# Patient Record
Sex: Female | Born: 1965 | Race: White | Hispanic: No | Marital: Single | Smoking: Former smoker
Health system: Southern US, Community
[De-identification: ages and names within clinical notes are randomized; demographics above are authoritative.]

## PROBLEM LIST (undated history)

## (undated) DIAGNOSIS — T8859XA Other complications of anesthesia, initial encounter: Secondary | ICD-10-CM

## (undated) DIAGNOSIS — IMO0002 Reserved for concepts with insufficient information to code with codable children: Secondary | ICD-10-CM

## (undated) DIAGNOSIS — K649 Unspecified hemorrhoids: Secondary | ICD-10-CM

## (undated) DIAGNOSIS — R519 Headache, unspecified: Secondary | ICD-10-CM

## (undated) DIAGNOSIS — N200 Calculus of kidney: Secondary | ICD-10-CM

## (undated) DIAGNOSIS — Z87442 Personal history of urinary calculi: Secondary | ICD-10-CM

## (undated) DIAGNOSIS — I10 Essential (primary) hypertension: Secondary | ICD-10-CM

## (undated) DIAGNOSIS — E785 Hyperlipidemia, unspecified: Secondary | ICD-10-CM

## (undated) DIAGNOSIS — M549 Dorsalgia, unspecified: Secondary | ICD-10-CM

## (undated) DIAGNOSIS — Z9889 Other specified postprocedural states: Secondary | ICD-10-CM

## (undated) DIAGNOSIS — F419 Anxiety disorder, unspecified: Secondary | ICD-10-CM

## (undated) DIAGNOSIS — R0602 Shortness of breath: Secondary | ICD-10-CM

## (undated) DIAGNOSIS — N289 Disorder of kidney and ureter, unspecified: Secondary | ICD-10-CM

## (undated) DIAGNOSIS — R112 Nausea with vomiting, unspecified: Secondary | ICD-10-CM

## (undated) DIAGNOSIS — G8929 Other chronic pain: Secondary | ICD-10-CM

## (undated) DIAGNOSIS — F32A Depression, unspecified: Secondary | ICD-10-CM

## (undated) DIAGNOSIS — T4145XA Adverse effect of unspecified anesthetic, initial encounter: Secondary | ICD-10-CM

## (undated) HISTORY — PX: CHOLECYSTECTOMY: SHX55

## (undated) HISTORY — DX: Unspecified hemorrhoids: K64.9

## (undated) HISTORY — DX: Other chronic pain: G89.29

## (undated) HISTORY — PX: LITHOTRIPSY: SUR834

## (undated) HISTORY — DX: Hyperlipidemia, unspecified: E78.5

## (undated) HISTORY — DX: Dorsalgia, unspecified: M54.9

## (undated) HISTORY — DX: Reserved for concepts with insufficient information to code with codable children: IMO0002

---
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
## 2001-06-20 HISTORY — PX: KNEE ARTHROSCOPY: SUR90

## 2002-10-21 ENCOUNTER — Emergency Department (HOSPITAL_COMMUNITY): Admission: EM | Admit: 2002-10-21 | Discharge: 2002-10-21 | Payer: Self-pay | Admitting: Emergency Medicine

## 2002-10-21 ENCOUNTER — Encounter: Payer: Self-pay | Admitting: *Deleted

## 2006-04-12 ENCOUNTER — Emergency Department (HOSPITAL_COMMUNITY): Admission: EM | Admit: 2006-04-12 | Discharge: 2006-04-12 | Payer: Self-pay | Admitting: Emergency Medicine

## 2006-10-11 ENCOUNTER — Emergency Department (HOSPITAL_COMMUNITY): Admission: EM | Admit: 2006-10-11 | Discharge: 2006-10-11 | Payer: Self-pay | Admitting: Emergency Medicine

## 2009-04-15 ENCOUNTER — Ambulatory Visit (HOSPITAL_COMMUNITY): Admission: RE | Admit: 2009-04-15 | Discharge: 2009-04-15 | Payer: Self-pay | Admitting: Family Medicine

## 2010-12-27 ENCOUNTER — Other Ambulatory Visit: Payer: Self-pay | Admitting: Neurosurgery

## 2010-12-27 DIAGNOSIS — M5126 Other intervertebral disc displacement, lumbar region: Secondary | ICD-10-CM

## 2010-12-28 ENCOUNTER — Ambulatory Visit
Admission: RE | Admit: 2010-12-28 | Discharge: 2010-12-28 | Disposition: A | Discharge status: Home / Self Care | Payer: Medicaid Other | Source: Ambulatory Visit | Attending: Neurosurgery | Admitting: Neurosurgery

## 2010-12-28 DIAGNOSIS — M5126 Other intervertebral disc displacement, lumbar region: Secondary | ICD-10-CM

## 2011-02-17 ENCOUNTER — Ambulatory Visit: Payer: Medicaid Other | Attending: Neurosurgery | Admitting: Physical Therapy

## 2011-02-17 DIAGNOSIS — R5381 Other malaise: Secondary | ICD-10-CM | POA: Insufficient documentation

## 2011-02-17 DIAGNOSIS — M545 Low back pain, unspecified: Secondary | ICD-10-CM | POA: Insufficient documentation

## 2011-02-17 DIAGNOSIS — IMO0001 Reserved for inherently not codable concepts without codable children: Secondary | ICD-10-CM | POA: Insufficient documentation

## 2011-02-24 ENCOUNTER — Encounter: Payer: Medicaid Other | Admitting: *Deleted

## 2011-03-02 ENCOUNTER — Ambulatory Visit: Payer: Medicaid Other | Attending: Neurosurgery | Admitting: Physical Therapy

## 2011-03-02 DIAGNOSIS — M545 Low back pain, unspecified: Secondary | ICD-10-CM | POA: Insufficient documentation

## 2011-03-02 DIAGNOSIS — R5381 Other malaise: Secondary | ICD-10-CM | POA: Insufficient documentation

## 2011-03-02 DIAGNOSIS — IMO0001 Reserved for inherently not codable concepts without codable children: Secondary | ICD-10-CM | POA: Insufficient documentation

## 2011-03-07 ENCOUNTER — Encounter: Payer: Medicaid Other | Admitting: Physical Therapy

## 2011-03-15 ENCOUNTER — Other Ambulatory Visit: Payer: Self-pay | Admitting: Neurosurgery

## 2011-03-15 DIAGNOSIS — M5126 Other intervertebral disc displacement, lumbar region: Secondary | ICD-10-CM

## 2011-03-22 ENCOUNTER — Inpatient Hospital Stay
Admission: RE | Admit: 2011-03-22 | Discharge: 2011-03-22 | Payer: Medicaid Other | Source: Ambulatory Visit | Attending: Neurosurgery | Admitting: Neurosurgery

## 2011-04-05 ENCOUNTER — Ambulatory Visit
Admission: RE | Admit: 2011-04-05 | Discharge: 2011-04-05 | Disposition: A | Discharge status: Home / Self Care | Payer: Medicaid Other | Source: Ambulatory Visit | Attending: Neurosurgery | Admitting: Neurosurgery

## 2011-04-05 DIAGNOSIS — M5126 Other intervertebral disc displacement, lumbar region: Secondary | ICD-10-CM

## 2011-04-05 MED ORDER — METHYLPREDNISOLONE ACETATE 40 MG/ML INJ SUSP (RADIOLOG
120.0000 mg | Freq: Once | INTRAMUSCULAR | Status: AC
  Administered 2011-04-05: 120 mg via EPIDURAL

## 2011-04-05 MED ORDER — IOHEXOL 180 MG/ML  SOLN
1.0000 mL | Freq: Once | INTRAMUSCULAR | Status: AC | PRN
  Administered 2011-04-05: 1 mL via EPIDURAL

## 2011-11-25 ENCOUNTER — Other Ambulatory Visit: Payer: Self-pay | Admitting: Neurosurgery

## 2011-11-25 DIAGNOSIS — M5416 Radiculopathy, lumbar region: Secondary | ICD-10-CM

## 2011-12-02 ENCOUNTER — Inpatient Hospital Stay: Admission: RE | Admit: 2011-12-02 | Payer: Medicaid Other | Source: Ambulatory Visit

## 2011-12-08 ENCOUNTER — Other Ambulatory Visit: Payer: Medicaid Other

## 2011-12-11 ENCOUNTER — Other Ambulatory Visit: Payer: Medicaid Other

## 2011-12-16 ENCOUNTER — Ambulatory Visit
Admission: RE | Admit: 2011-12-16 | Discharge: 2011-12-16 | Disposition: A | Discharge status: Home / Self Care | Payer: Medicaid Other | Source: Ambulatory Visit | Attending: Neurosurgery | Admitting: Neurosurgery

## 2011-12-16 DIAGNOSIS — M5416 Radiculopathy, lumbar region: Secondary | ICD-10-CM

## 2012-01-23 ENCOUNTER — Other Ambulatory Visit: Payer: Self-pay | Admitting: Neurosurgery

## 2012-02-21 ENCOUNTER — Encounter (HOSPITAL_COMMUNITY): Payer: Self-pay | Admitting: Pharmacy Technician

## 2012-02-23 ENCOUNTER — Encounter (HOSPITAL_COMMUNITY)
Admission: RE | Admit: 2012-02-23 | Discharge: 2012-02-23 | Disposition: A | Discharge status: Home / Self Care | Payer: Medicaid Other | Source: Ambulatory Visit | Attending: Neurosurgery | Admitting: Neurosurgery

## 2012-02-23 ENCOUNTER — Encounter (HOSPITAL_COMMUNITY): Payer: Self-pay

## 2012-02-23 HISTORY — DX: Adverse effect of unspecified anesthetic, initial encounter: T41.45XA

## 2012-02-23 HISTORY — DX: Other complications of anesthesia, initial encounter: T88.59XA

## 2012-02-23 HISTORY — DX: Shortness of breath: R06.02

## 2012-02-23 LAB — CBC
HCT: 37.4 % (ref 36.0–46.0)
Platelets: 223 10*3/uL (ref 150–400)
RBC: 3.98 MIL/uL (ref 3.87–5.11)
RDW: 12.9 % (ref 11.5–15.5)
WBC: 6.1 10*3/uL (ref 4.0–10.5)

## 2012-02-23 LAB — SURGICAL PCR SCREEN: Staphylococcus aureus: NEGATIVE

## 2012-02-23 MED ORDER — CEFAZOLIN SODIUM-DEXTROSE 2-3 GM-% IV SOLR: 2.0000 g | INTRAVENOUS | Status: DC

## 2012-02-23 NOTE — Pre-Procedure Instructions (Signed)
20 Sandra Carroll  02/23/2012   Your procedure is scheduled on:  Friday March 02, 2012  Report to [facility] at 5:30 AM.  Call this number if you have problems the morning of surgery: [phone]   Remember:   Do not eat food or drink:After Midnight.      Take these medicines the morning of surgery with A SIP OF WATER: percocet, tizanidine   Do not wear jewelry, make-up or nail polish.  Do not wear lotions, powders, or perfumes. You may wear deodorant.  Do not shave 48 hours prior to surgery. Men may shave face and neck.  Do not bring valuables to the hospital.  Contacts, dentures or bridgework may not be worn into surgery.  Leave suitcase in the car. After surgery it may be brought to your room.  For patients admitted to the hospital, checkout time is 11:00 AM the day of discharge.   Patients discharged the day of surgery will not be allowed to drive home.  Name and phone number of your driver: family / friend  Special Instructions: CHG Shower Use Special Wash: 1/2 bottle night before surgery and 1/2 bottle morning of surgery.   Please read over the following fact sheets that you were given: Pain Booklet, Coughing and Deep Breathing, MRSA Information and Surgical Site Infection Prevention

## 2012-03-02 ENCOUNTER — Encounter (HOSPITAL_COMMUNITY)
Admission: RE | Disposition: A | Discharge status: Home / Self Care | Payer: Self-pay | Source: Ambulatory Visit | Attending: Neurosurgery

## 2012-03-02 ENCOUNTER — Ambulatory Visit (HOSPITAL_COMMUNITY): Payer: Medicaid Other | Admitting: Anesthesiology

## 2012-03-02 ENCOUNTER — Encounter (HOSPITAL_COMMUNITY): Payer: Self-pay | Admitting: *Deleted

## 2012-03-02 ENCOUNTER — Ambulatory Visit (HOSPITAL_COMMUNITY): Payer: Medicaid Other

## 2012-03-02 ENCOUNTER — Encounter (HOSPITAL_COMMUNITY): Payer: Self-pay | Admitting: Anesthesiology

## 2012-03-02 ENCOUNTER — Ambulatory Visit (HOSPITAL_COMMUNITY)
Admission: RE | Admit: 2012-03-02 | Discharge: 2012-03-03 | Disposition: A | Discharge status: Home / Self Care | Payer: Medicaid Other | Source: Ambulatory Visit | Attending: Neurosurgery | Admitting: Neurosurgery

## 2012-03-02 DIAGNOSIS — Z01812 Encounter for preprocedural laboratory examination: Secondary | ICD-10-CM | POA: Insufficient documentation

## 2012-03-02 DIAGNOSIS — M5126 Other intervertebral disc displacement, lumbar region: Secondary | ICD-10-CM | POA: Insufficient documentation

## 2012-03-02 HISTORY — PX: LUMBAR LAMINECTOMY/DECOMPRESSION MICRODISCECTOMY: SHX5026

## 2012-03-02 SURGERY — LUMBAR LAMINECTOMY/DECOMPRESSION MICRODISCECTOMY 1 LEVEL
Anesthesia: General | Laterality: Left | Wound class: Clean

## 2012-03-02 MED ORDER — HEMOSTATIC AGENTS (NO CHARGE) OPTIME
TOPICAL | Status: DC | PRN
  Administered 2012-03-02: 1 via TOPICAL

## 2012-03-02 MED ORDER — LIDOCAINE-EPINEPHRINE 0.5 %-1:200000 IJ SOLN
INTRAMUSCULAR | Status: DC | PRN
  Administered 2012-03-02: 20 mg

## 2012-03-02 MED ORDER — 0.9 % SODIUM CHLORIDE (POUR BTL) OPTIME
TOPICAL | Status: DC | PRN
  Administered 2012-03-02: 1000 mL

## 2012-03-02 MED ORDER — NEOSTIGMINE METHYLSULFATE 1 MG/ML IJ SOLN
INTRAMUSCULAR | Status: DC | PRN
  Administered 2012-03-02: 5 mg via INTRAVENOUS

## 2012-03-02 MED ORDER — KETOROLAC TROMETHAMINE 30 MG/ML IJ SOLN
30.0000 mg | Freq: Four times a day (QID) | INTRAMUSCULAR | Status: DC
  Administered 2012-03-02 – 2012-03-03 (×3): 30 mg via INTRAVENOUS
  Filled 2012-03-02 (×7): qty 1

## 2012-03-02 MED ORDER — SODIUM CHLORIDE 0.9 % IJ SOLN: 3.0000 mL | INTRAMUSCULAR | Status: DC | PRN

## 2012-03-02 MED ORDER — CYCLOBENZAPRINE HCL 10 MG PO TABS: 10.0000 mg | ORAL_TABLET | Freq: Three times a day (TID) | ORAL | Status: AC | PRN

## 2012-03-02 MED ORDER — PROPOFOL 10 MG/ML IV BOLUS
INTRAVENOUS | Status: DC | PRN
  Administered 2012-03-02: 200 mg via INTRAVENOUS
  Administered 2012-03-02: 50 mg via INTRAVENOUS

## 2012-03-02 MED ORDER — FENTANYL CITRATE 0.05 MG/ML IJ SOLN
INTRAMUSCULAR | Status: DC | PRN
  Administered 2012-03-02: 100 ug via INTRAVENOUS

## 2012-03-02 MED ORDER — FENTANYL CITRATE 0.05 MG/ML IJ SOLN
INTRAMUSCULAR | Status: DC | PRN
  Administered 2012-03-02: 100 ug via INTRAVENOUS
  Administered 2012-03-02: 50 ug via INTRAVENOUS
  Administered 2012-03-02: 200 ug via INTRAVENOUS
  Administered 2012-03-02 (×2): 50 ug via INTRAVENOUS
  Administered 2012-03-02: 150 ug via INTRAVENOUS

## 2012-03-02 MED ORDER — LIDOCAINE HCL (CARDIAC) 20 MG/ML IV SOLN
INTRAVENOUS | Status: DC | PRN
  Administered 2012-03-02: 100 mg via INTRAVENOUS

## 2012-03-02 MED ORDER — ACETAMINOPHEN 10 MG/ML IV SOLN
1000.0000 mg | Freq: Four times a day (QID) | INTRAVENOUS | Status: DC
  Administered 2012-03-02 – 2012-03-03 (×3): 1000 mg via INTRAVENOUS
  Filled 2012-03-02 (×4): qty 100

## 2012-03-02 MED ORDER — ACETAMINOPHEN 10 MG/ML IV SOLN
INTRAVENOUS | Status: DC | PRN
  Administered 2012-03-02: 1000 mg via INTRAVENOUS

## 2012-03-02 MED ORDER — FENTANYL CITRATE 0.05 MG/ML IJ SOLN
INTRAMUSCULAR | Status: AC
  Filled 2012-03-02: qty 2

## 2012-03-02 MED ORDER — ACETAMINOPHEN 10 MG/ML IV SOLN
INTRAVENOUS | Status: AC
  Filled 2012-03-02: qty 100

## 2012-03-02 MED ORDER — PROMETHAZINE HCL 25 MG/ML IJ SOLN: 6.2500 mg | INTRAMUSCULAR | Status: DC | PRN

## 2012-03-02 MED ORDER — FERROUS SULFATE 325 (65 FE) MG PO TABS
325.0000 mg | ORAL_TABLET | Freq: Every day | ORAL | Status: DC
  Administered 2012-03-03: 325 mg via ORAL
  Filled 2012-03-02 (×2): qty 1

## 2012-03-02 MED ORDER — TIZANIDINE HCL 4 MG PO TABS
4.0000 mg | ORAL_TABLET | Freq: Four times a day (QID) | ORAL | Status: DC | PRN
  Filled 2012-03-02: qty 1

## 2012-03-02 MED ORDER — MIDAZOLAM HCL 2 MG/2ML IJ SOLN: 0.5000 mg | Freq: Once | INTRAMUSCULAR | Status: DC | PRN

## 2012-03-02 MED ORDER — ONDANSETRON HCL 4 MG/2ML IJ SOLN
INTRAMUSCULAR | Status: DC | PRN
  Administered 2012-03-02: 4 mg via INTRAVENOUS

## 2012-03-02 MED ORDER — OXYCODONE-ACETAMINOPHEN 5-325 MG PO TABS
1.0000 | ORAL_TABLET | ORAL | Status: DC | PRN
  Administered 2012-03-02 – 2012-03-03 (×3): 2 via ORAL
  Filled 2012-03-02 (×3): qty 2

## 2012-03-02 MED ORDER — SODIUM CHLORIDE 0.9 % IV SOLN
INTRAVENOUS | Status: AC
  Filled 2012-03-02: qty 500

## 2012-03-02 MED ORDER — CEFAZOLIN SODIUM-DEXTROSE 2-3 GM-% IV SOLR
INTRAVENOUS | Status: AC
  Administered 2012-03-02: 2 g via INTRAVENOUS
  Filled 2012-03-02: qty 50

## 2012-03-02 MED ORDER — HYDROMORPHONE HCL PF 1 MG/ML IJ SOLN
INTRAMUSCULAR | Status: AC
  Filled 2012-03-02: qty 1

## 2012-03-02 MED ORDER — VECURONIUM BROMIDE 10 MG IV SOLR
INTRAVENOUS | Status: DC | PRN
  Administered 2012-03-02: 2 mg via INTRAVENOUS
  Administered 2012-03-02: 3 mg via INTRAVENOUS

## 2012-03-02 MED ORDER — LACTATED RINGERS IV SOLN
INTRAVENOUS | Status: DC | PRN
  Administered 2012-03-02 (×3): via INTRAVENOUS

## 2012-03-02 MED ORDER — SIMVASTATIN 20 MG PO TABS
20.0000 mg | ORAL_TABLET | Freq: Every evening | ORAL | Status: DC
  Administered 2012-03-02: 20 mg via ORAL
  Filled 2012-03-02 (×2): qty 1

## 2012-03-02 MED ORDER — PHENOL 1.4 % MT LIQD: 1.0000 | OROMUCOSAL | Status: DC | PRN

## 2012-03-02 MED ORDER — MEPERIDINE HCL 25 MG/ML IJ SOLN: 6.2500 mg | INTRAMUSCULAR | Status: DC | PRN

## 2012-03-02 MED ORDER — DEXAMETHASONE SODIUM PHOSPHATE 4 MG/ML IJ SOLN
INTRAMUSCULAR | Status: DC | PRN
  Administered 2012-03-02: 8 mg via INTRAVENOUS

## 2012-03-02 MED ORDER — ONDANSETRON HCL 4 MG/2ML IJ SOLN
4.0000 mg | INTRAMUSCULAR | Status: DC | PRN
  Administered 2012-03-02: 4 mg via INTRAVENOUS
  Filled 2012-03-02: qty 2

## 2012-03-02 MED ORDER — FOLIC ACID 800 MCG PO TABS: 800.0000 ug | ORAL_TABLET | Freq: Every day | ORAL | Status: DC

## 2012-03-02 MED ORDER — GLYCOPYRROLATE 0.2 MG/ML IJ SOLN
INTRAMUSCULAR | Status: DC | PRN
  Administered 2012-03-02: .6 mg via INTRAVENOUS

## 2012-03-02 MED ORDER — MORPHINE SULFATE 2 MG/ML IJ SOLN: 1.0000 mg | INTRAMUSCULAR | Status: DC | PRN

## 2012-03-02 MED ORDER — ACETAMINOPHEN 325 MG PO TABS: 650.0000 mg | ORAL_TABLET | ORAL | Status: DC | PRN

## 2012-03-02 MED ORDER — FOLIC ACID 1 MG PO TABS
1.0000 mg | ORAL_TABLET | Freq: Every day | ORAL | Status: DC
  Administered 2012-03-02: 1 mg via ORAL
  Filled 2012-03-02 (×2): qty 1

## 2012-03-02 MED ORDER — HYDROCODONE-ACETAMINOPHEN 5-325 MG PO TABS: 1.0000 | ORAL_TABLET | ORAL | Status: DC | PRN

## 2012-03-02 MED ORDER — MENTHOL 3 MG MT LOZG: 1.0000 | LOZENGE | OROMUCOSAL | Status: DC | PRN

## 2012-03-02 MED ORDER — METHYLPREDNISOLONE ACETATE 80 MG/ML IJ SUSP
INTRAMUSCULAR | Status: DC | PRN
  Administered 2012-03-02: 80 mg via INTRALESIONAL

## 2012-03-02 MED ORDER — BACITRACIN 50000 UNITS IM SOLR
INTRAMUSCULAR | Status: AC
  Filled 2012-03-02: qty 1

## 2012-03-02 MED ORDER — POTASSIUM CHLORIDE IN NACL 20-0.9 MEQ/L-% IV SOLN
INTRAVENOUS | Status: DC
  Filled 2012-03-02 (×3): qty 1000

## 2012-03-02 MED ORDER — ACETAMINOPHEN 650 MG RE SUPP: 650.0000 mg | RECTAL | Status: DC | PRN

## 2012-03-02 MED ORDER — SODIUM CHLORIDE 0.9 % IJ SOLN
3.0000 mL | Freq: Two times a day (BID) | INTRAMUSCULAR | Status: DC
  Administered 2012-03-02 (×2): 3 mL via INTRAVENOUS

## 2012-03-02 MED ORDER — ASPIRIN EC 325 MG PO TBEC
325.0000 mg | DELAYED_RELEASE_TABLET | Freq: Every day | ORAL | Status: DC
  Administered 2012-03-02: 325 mg via ORAL
  Filled 2012-03-02 (×3): qty 1

## 2012-03-02 MED ORDER — MIDAZOLAM HCL 5 MG/5ML IJ SOLN
INTRAMUSCULAR | Status: DC | PRN
  Administered 2012-03-02: 2 mg via INTRAVENOUS

## 2012-03-02 MED ORDER — ROCURONIUM BROMIDE 100 MG/10ML IV SOLN
INTRAVENOUS | Status: DC | PRN
  Administered 2012-03-02: 50 mg via INTRAVENOUS

## 2012-03-02 MED ORDER — DEXTROSE 5 % IV SOLN
INTRAVENOUS | Status: DC | PRN
  Administered 2012-03-02: 10:00:00 via INTRAVENOUS

## 2012-03-02 MED ORDER — THROMBIN 5000 UNITS EX KIT
PACK | CUTANEOUS | Status: DC | PRN
  Administered 2012-03-02 (×2): 5000 [IU] via TOPICAL

## 2012-03-02 MED ORDER — HYDROMORPHONE HCL PF 1 MG/ML IJ SOLN
0.2500 mg | INTRAMUSCULAR | Status: DC | PRN
  Administered 2012-03-02 (×4): 0.5 mg via INTRAVENOUS

## 2012-03-02 MED ORDER — DIAZEPAM 5 MG PO TABS
5.0000 mg | ORAL_TABLET | Freq: Four times a day (QID) | ORAL | Status: DC | PRN
  Administered 2012-03-02 – 2012-03-03 (×2): 5 mg via ORAL
  Filled 2012-03-02 (×2): qty 1

## 2012-03-02 SURGICAL SUPPLY — 64 items
ADH SKN CLS APL DERMABOND .7 (GAUZE/BANDAGES/DRESSINGS) ×1
APL SKNCLS STERI-STRIP NONHPOA (GAUZE/BANDAGES/DRESSINGS)
BAG DECANTER FOR FLEXI CONT (MISCELLANEOUS) ×1 IMPLANT
BENZOIN TINCTURE PRP APPL 2/3 (GAUZE/BANDAGES/DRESSINGS) IMPLANT
BLADE SURG ROTATE 9660 (MISCELLANEOUS) IMPLANT
BUR MATCHSTICK NEURO 3.0 LAGG (BURR) ×2 IMPLANT
CANISTER SUCTION 2500CC (MISCELLANEOUS) ×2 IMPLANT
CLOTH BEACON ORANGE TIMEOUT ST (SAFETY) ×2 IMPLANT
CONT SPEC 4OZ CLIKSEAL STRL BL (MISCELLANEOUS) ×2 IMPLANT
DECANTER SPIKE VIAL GLASS SM (MISCELLANEOUS) ×2 IMPLANT
DERMABOND ADVANCED (GAUZE/BANDAGES/DRESSINGS) ×1
DERMABOND ADVANCED .7 DNX12 (GAUZE/BANDAGES/DRESSINGS) ×1 IMPLANT
DRAPE LAPAROTOMY 100X72X124 (DRAPES) ×2 IMPLANT
DRAPE MICROSCOPE LEICA (MISCELLANEOUS) IMPLANT
DRAPE MICROSCOPE ZEISS OPMI (DRAPES) ×2 IMPLANT
DRAPE POUCH INSTRU U-SHP 10X18 (DRAPES) ×2 IMPLANT
DRAPE SURG 17X23 STRL (DRAPES) ×2 IMPLANT
DURAPREP 26ML APPLICATOR (WOUND CARE) ×2 IMPLANT
ELECT BLADE 4.0 EZ CLEAN MEGAD (MISCELLANEOUS) ×2
ELECT REM PT RETURN 9FT ADLT (ELECTROSURGICAL) ×2
ELECTRODE BLDE 4.0 EZ CLN MEGD (MISCELLANEOUS) IMPLANT
ELECTRODE REM PT RTRN 9FT ADLT (ELECTROSURGICAL) ×1 IMPLANT
GAUZE SPONGE 4X4 16PLY XRAY LF (GAUZE/BANDAGES/DRESSINGS) IMPLANT
GLOVE BIO SURGEON STRL SZ8 (GLOVE) ×1 IMPLANT
GLOVE BIOGEL PI IND STRL 7.0 (GLOVE) IMPLANT
GLOVE BIOGEL PI IND STRL 7.5 (GLOVE) IMPLANT
GLOVE BIOGEL PI INDICATOR 7.0 (GLOVE) ×1
GLOVE BIOGEL PI INDICATOR 7.5 (GLOVE) ×1
GLOVE ECLIPSE 6.5 STRL STRAW (GLOVE) ×2 IMPLANT
GLOVE ECLIPSE 7.5 STRL STRAW (GLOVE) ×1 IMPLANT
GLOVE EXAM NITRILE LRG STRL (GLOVE) IMPLANT
GLOVE EXAM NITRILE MD LF STRL (GLOVE) IMPLANT
GLOVE EXAM NITRILE XL STR (GLOVE) IMPLANT
GLOVE EXAM NITRILE XS STR PU (GLOVE) IMPLANT
GLOVE SS BIOGEL STRL SZ 6.5 (GLOVE) IMPLANT
GLOVE SUPERSENSE BIOGEL SZ 6.5 (GLOVE) ×2
GOWN BRE IMP SLV AUR LG STRL (GOWN DISPOSABLE) ×4 IMPLANT
GOWN BRE IMP SLV AUR XL STRL (GOWN DISPOSABLE) ×2 IMPLANT
GOWN STRL REIN 2XL LVL4 (GOWN DISPOSABLE) IMPLANT
KIT BASIN OR (CUSTOM PROCEDURE TRAY) ×2 IMPLANT
KIT ROOM TURNOVER OR (KITS) ×2 IMPLANT
NDL HYPO 18GX1.5 BLUNT FILL (NEEDLE) IMPLANT
NDL HYPO 25X1 1.5 SAFETY (NEEDLE) ×1 IMPLANT
NDL SPNL 18GX3.5 QUINCKE PK (NEEDLE) IMPLANT
NEEDLE HYPO 18GX1.5 BLUNT FILL (NEEDLE) ×2 IMPLANT
NEEDLE HYPO 25X1 1.5 SAFETY (NEEDLE) ×2 IMPLANT
NEEDLE SPNL 18GX3.5 QUINCKE PK (NEEDLE) ×2 IMPLANT
NS IRRIG 1000ML POUR BTL (IV SOLUTION) ×2 IMPLANT
PACK LAMINECTOMY NEURO (CUSTOM PROCEDURE TRAY) ×2 IMPLANT
PAD ARMBOARD 7.5X6 YLW CONV (MISCELLANEOUS) ×6 IMPLANT
RUBBERBAND STERILE (MISCELLANEOUS) ×4 IMPLANT
SPONGE GAUZE 4X4 12PLY (GAUZE/BANDAGES/DRESSINGS) IMPLANT
SPONGE LAP 4X18 X RAY DECT (DISPOSABLE) IMPLANT
SPONGE SURGIFOAM ABS GEL SZ50 (HEMOSTASIS) ×2 IMPLANT
STRIP CLOSURE SKIN 1/2X4 (GAUZE/BANDAGES/DRESSINGS) ×1 IMPLANT
SUT VIC AB 0 CT1 18XCR BRD8 (SUTURE) ×1 IMPLANT
SUT VIC AB 0 CT1 8-18 (SUTURE) ×2
SUT VIC AB 2-0 CT1 18 (SUTURE) ×2 IMPLANT
SUT VIC AB 3-0 SH 8-18 (SUTURE) ×4 IMPLANT
SYR 20ML ECCENTRIC (SYRINGE) ×2 IMPLANT
SYR 5ML LL (SYRINGE) ×2 IMPLANT
TOWEL OR 17X24 6PK STRL BLUE (TOWEL DISPOSABLE) ×2 IMPLANT
TOWEL OR 17X26 10 PK STRL BLUE (TOWEL DISPOSABLE) ×2 IMPLANT
WATER STERILE IRR 1000ML POUR (IV SOLUTION) ×2 IMPLANT

## 2012-03-02 NOTE — Plan of Care (Signed)
Problem: Consults Goal: Diagnosis - Spinal Surgery Outcome: Completed/Met Date Met:  03/02/12 Lumbar Laminectomy (Complex)     

## 2012-03-02 NOTE — H&P (Signed)
  BP 119/81  Pulse 70  Temp 98.3 F (36.8 C) (Oral)  Resp 18  SpO2 97%  HISTORY:     Sandra Carroll comes in today for evaluation of pain that she has in her back and left lower extremity for about the last 18 months.  She underwent an MRI scan last year in June which showed a herniated disc on the left side at L5-S1.  This is the side where she has her pain.  She was seen by Dr. Channing Mutters, but Sandra Carroll states that his primary focus was that she couldn't have surgery until she lost approximately 50 lbs.  He thought she did need the surgery, but was unwilling to do it.  Sandra Carroll is 46 years of age.    PAST MEDICAL HISTORY:  Significant for hyperlipidemia and degenerative disc disease.    DRUG ALLERGIES:    No known drug allergies.    SOCIAL HISTORY:    She does not smoke.  She does not use alcohol.  She does not use illicit drugs.  She is 157 cm. in height and weight is 216 lbs.    MEDICATIONS:    She takes an iron tablet, Vitamin D3, Fish Oil, Aspirin, Ibuprofen, Simvastatin, Zanaflex and Percocet 10/325's.    PAST SURGICAL HISTORY:  No previous operations.    REVIEW OF SYSTEMS:   Positive for back pain and left lower extremity pain.  She denies constitutional, eye, ear, nose, throat, mouth, cardiovascular, respiratory, gastrointestinal, genitourinary, musculoskeletal, skin, neurological, psychiatric, endocrine, hematologic and allergic problems.    EXAMINATION:    On examination she is alert, oriented x 4 and answering all questions appropriately.  Memory, language, attention span and fund of knowledge are normal.  She is well kempt and in no distress.  5/5 strength in the upper extremities and right lower extremity.  Weakness in the left hip flexor and quadriceps.  Proprioception intact in the upper and lower extremities.  Toes downgoing to plantar stimulation.  2+ reflexes in the biceps, triceps, brachioradialis, knees and ankles.  Gait is antalgic.    Sandra Carroll  MRI of the lumbar  spine shows  a disc at L5-S1 on the left side which I do believe is causing her discomfort.  At this point in time, she would like to proceed with surgery.   We discussed the operation, the fact that there is no guarantee that she will be pain free or that she will even be better.  Risks, bleeding, infection, no relief, need for further surgery and disc recurrence were all discussed.  Weakness in the lower extremities and bowel and bladder dysfunction were also discussed.  I gave her a detailed instruction sheet going over the procedure. She wishes to proceed.

## 2012-03-02 NOTE — Anesthesia Postprocedure Evaluation (Signed)
  Anesthesia Post-op Note  Patient: Sandra Carroll  Procedure(s) Performed: Procedure(s) (LRB) with comments: LUMBAR LAMINECTOMY/DECOMPRESSION MICRODISCECTOMY 1 LEVEL (Left) - LEFT Lumbar Five-Sacral One Laminectomy for Decompression   Patient Location: PACU  Anesthesia Type: General  Level of Consciousness: awake, alert , oriented and patient cooperative  Airway and Oxygen Therapy: Patient Spontanous Breathing  Post-op Pain: none  Post-op Assessment: Post-op Vital signs reviewed, Patient's Cardiovascular Status Stable, Respiratory Function Stable, Patent Airway, No signs of Nausea or vomiting and Pain level controlled  Post-op Vital Signs: Reviewed and stable  Complications: No apparent anesthesia complications

## 2012-03-02 NOTE — Transfer of Care (Signed)
Immediate Anesthesia Transfer of Care Note  Patient: Sandra Carroll  Procedure(s) Performed: Procedure(s) (LRB) with comments: LUMBAR LAMINECTOMY/DECOMPRESSION MICRODISCECTOMY 1 LEVEL (Left) - LEFT Lumbar Five-Sacral One Laminectomy for Decompression   Patient Location: PACU  Anesthesia Type: General  Level of Consciousness: oriented, sedated, patient cooperative and responds to stimulation  Airway & Oxygen Therapy: Patient Spontanous Breathing and Patient connected to nasal cannula oxygen  Post-op Assessment: Report given to PACU RN, Post -op Vital signs reviewed and stable, Patient moving all extremities and Patient moving all extremities X 4  Post vital signs: Reviewed and stable  Complications: No apparent anesthesia complications

## 2012-03-02 NOTE — Anesthesia Preprocedure Evaluation (Addendum)
Anesthesia Evaluation  Patient identified by MRN, date of birth, ID band Patient awake    Reviewed: Allergy & Precautions  History of Anesthesia Complications (+) PONV  Airway Mallampati: II TM Distance: >3 FB Neck ROM: Full    Dental No notable dental hx. (+) Teeth Intact and Dental Advisory Given   Pulmonary neg pulmonary ROS, neg shortness of breath,  breath sounds clear to auscultation  Pulmonary exam normal       Cardiovascular negative cardio ROS  Rhythm:Regular Rate:Normal     Neuro/Psych Chronic back pain: narcotics  CLINICAL DATA:  Lumbosacral spondylosis without myelopathy. Displacement of the L4-5 and L5-S1 lumbar disc. The patient had significant relief of pain from a prior injection.  Her pain has since recurred.     GI/Hepatic negative GI ROS, Neg liver ROS,   Endo/Other  Morbid obesity  Renal/GU negative Renal ROS  negative genitourinary   Musculoskeletal  (+) Arthritis -, Osteoarthritis,    Abdominal (+) + obese,   Peds negative pediatric ROS (+)  Hematology negative hematology ROS (+)   Anesthesia Other Findings   Reproductive/Obstetrics BTL                         Anesthesia Physical Anesthesia Plan  ASA: II  Anesthesia Plan: General   Post-op Pain Management:    Induction: Intravenous  Airway Management Planned: Oral ETT  Additional Equipment:   Intra-op Plan:   Post-operative Plan: Extubation in OR  Informed Consent: I have reviewed the patients History and Physical, chart, labs and discussed the procedure including the risks, benefits and alternatives for the proposed anesthesia with the patient or authorized representative who has indicated his/her understanding and acceptance.   Dental advisory given  Plan Discussed with: CRNA and Surgeon  Anesthesia Plan Comments: (Plan routine monitors, GETA)        Anesthesia Quick Evaluation

## 2012-03-02 NOTE — Op Note (Signed)
03/02/2012  1:29 PM  PATIENT:  Sandra Carroll  46 y.o. 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 was taken to the operating room intubated and placed under a general anesthetic without difficulty. She was positioned on a Wilson frame with all pressure points properly padded. Her back was prepped and draped in a sterile manner. I infiltrated lidocaine into the lumbar region. I opened the skin with a 10 blade and took the incision down to the lumbar region. I exposed the lamina of L5/S1 on the left side. I confirmed my locAtion with intraoperative xray. I performed a semihemilaminectomy of L5 and S1 using the drill and Kerrison punches. I encountered a mass overlying the disc space. I created more space laterally by performing a partial facetectomy of the inferior facet of L5. This mass I encountered could not be mobilized medially and I was not sure if it was disc or nerve root. I favored nerve root. Dr. Yetta Barre also felt it was not disc and favored nerve root. After taking more xrays to ensure my location I concluded that what was seen on the MRI was a conjoined root and not a disc herniation. I had decompressed the root already and the disc space wAS not accessible thus I was satisfied with the decompression . I palpated and had in my exposure both pedicles l5 and S1 and there was nothing left to do. I irrigated the wound then closed in  layers with vicryl sutures. I used dermabond for a sterile dressing.   PLAN OF CARE: Admit for overnight observation  PATIENT DISPOSITION:  PACU - hemodynamically stable.   Delay start of Pharmacological VTE agent (>24hrs) due to surgical blood loss or risk of bleeding:  yes

## 2012-03-02 NOTE — Anesthesia Procedure Notes (Signed)
Procedure Name: Intubation Date/Time: 03/02/2012 10:28 AM Performed by: Wray Kearns A Pre-anesthesia Checklist: Patient identified, Timeout performed, Emergency Drugs available, Suction available and Patient being monitored Patient Re-evaluated:Patient Re-evaluated prior to inductionOxygen Delivery Method: Circle system utilized Preoxygenation: Pre-oxygenation with 100% oxygen Intubation Type: IV induction and Cricoid Pressure applied Ventilation: Mask ventilation without difficulty Laryngoscope Size: Mac and 4 Grade View: Grade I Tube type: Oral Tube size: 7.5 mm Number of attempts: 1 Airway Equipment and Method: Stylet Placement Confirmation: ETT inserted through vocal cords under direct vision,  breath sounds checked- equal and bilateral and positive ETCO2 Secured at: 21 cm Tube secured with: Tape Dental Injury: Teeth and Oropharynx as per pre-operative assessment

## 2012-03-02 NOTE — Preoperative (Signed)
Beta Blockers   Reason not to administer Beta Blockers:Not Applicable 

## 2012-03-03 NOTE — Discharge Summary (Signed)
Physician Discharge Summary  Patient ID: Sandra Carroll MRN: [id number] DOB/AGE: June 26, 1965 46 y.o.  Admit date: 03/02/2012 Discharge date: 03/03/2012  Admission Diagnoses: Lumbar disc herniation   Discharge Diagnoses: Same   Discharged Condition: good  Hospital Course: The patient was admitted on 03/02/2012 and taken to the operating room where the patient underwent left L5-S1 hemilaminectomy. The patient tolerated the procedure well and was taken to the recovery room and then to the floor in stable condition. The hospital course was routine. There were no complications. The wound remained clean dry and intact. Pt had appropriate back soreness. No complaints of leg pain or new N/T/W. The patient remained afebrile with stable vital signs, and tolerated a regular diet. The patient continued to increase activities, and pain was well controlled with oral pain medications.   Consults: None  Significant Diagnostic Studies:  Results for orders placed during the hospital encounter of 02/23/12  SURGICAL PCR SCREEN      Component Value Range   MRSA, PCR NEGATIVE  NEGATIVE   Staphylococcus aureus NEGATIVE  NEGATIVE  CBC      Component Value Range   WBC 6.1  4.0 - 10.5 K/uL   RBC 3.98  3.87 - 5.11 MIL/uL   Hemoglobin 12.9  12.0 - 15.0 g/dL   HCT 40.9  81.1 - 91.4 %   MCV 94.0  78.0 - 100.0 fL   MCH 32.4  26.0 - 34.0 pg   MCHC 34.5  30.0 - 36.0 g/dL   RDW 78.2  95.6 - 21.3 %   Platelets 223  150 - 400 K/uL  HCG, SERUM, QUALITATIVE      Component Value Range   Preg, Serum NEGATIVE  NEGATIVE    Dg Lumbar Spine 2-3 Views  03/02/2012  *RADIOLOGY REPORT*  Clinical Data: Left L5-S1 microdiskectomy.  LUMBAR SPINE - 2-3 VIEW  Comparison: Lumbar MRI 12/16/2011.  Findings: Three cross-table lateral views of the lumbar spine are submitted from the operating room.  Image #1 at 1045 hours demonstrates a posterior localizing needle posterior to the L5 spinous process.  Image #2 at 1100 hours  demonstrates skin spreaders posteriorly at L5.  A blunt surgical instrument is directed towards the posterior aspect of L5-S1 disc space.  Image #3 demonstrates the surgical instrument over the posterior superior corner of the upper sacrum.  IMPRESSION: Intraoperative localization as described.   Original Report Authenticated By: Gerrianne Scale, M.D.    Dg Lumbar Spine 1 View  03/02/2012  *RADIOLOGY REPORT*  Clinical Data: L5-S1 discectomy.  LUMBAR SPINE - 1 VIEW  Comparison: Earlier the same date.  Findings: Cross-table lateral view labeled #4 at 1245 hours.  There are skin spreaders posteriorly at L5-S1.  Surgical instruments overlie the posterior aspect of the L5-S1 disc space.  IMPRESSION: Intraoperative localization as described.   Original Report Authenticated By: Gerrianne Scale, M.D.     Antibiotics:  Anti-infectives     Start     Dose/Rate Route Frequency Ordered Stop   03/02/12 1009   ceFAZolin (ANCEF) 2-3 GM-% IVPB SOLR     Comments: SAUVE, ROBIN: cabinet override         03/02/12 1009 03/02/12 1010   03/02/12 0959   bacitracin 08657 UNITS injection  Status:  Discontinued     Comments: Reece Packer: cabinet override         03/02/12 0959 03/02/12 0956          Discharge Exam: Blood pressure 115/69, pulse 88, temperature 99 F (  37.2 C), temperature source Oral, resp. rate 16, last menstrual period 02/01/2012, SpO2 94.00%. Neurologic: Grossly normal Incision clean dry and intact  Discharge Medications:     Medication List     As of 03/03/2012  7:45 AM    TAKE these medications         aspirin EC 325 MG tablet   Take 325 mg by mouth daily.      cyclobenzaprine 10 MG tablet   Commonly known as: FLEXERIL   Take 1 tablet (10 mg total) by mouth 3 (three) times daily as needed for muscle spasms.      folic acid 800 MCG tablet   Commonly known as: FOLVITE   Take 800 mcg by mouth daily.      IRON SUPPLEMENT 325 (65 FE) MG tablet   Generic drug: ferrous sulfate     Take 325 mg by mouth daily with breakfast.      oxyCODONE-acetaminophen 10-325 MG per tablet   Commonly known as: PERCOCET   Take 2 tablets by mouth every 4 (four) hours as needed. For hip pain.      simvastatin 20 MG tablet   Commonly known as: ZOCOR   Take 20 mg by mouth every evening.      tiZANidine 4 MG tablet   Commonly known as: ZANAFLEX   Take 4 mg by mouth every 6 (six) hours as needed. For muscle spasms.        Disposition: Home  Final Dx: Hemilaminectomy       Follow-up Information    Follow up with CABBELL,KYLE L, MD. In 3 weeks. (call to make Appt)    Contact information:   [address] [place] [address] [phone]           Signed: Tia Alert 03/03/2012, 7:45 AM

## 2012-03-05 ENCOUNTER — Encounter (HOSPITAL_COMMUNITY): Payer: Self-pay | Admitting: Neurosurgery

## 2013-02-13 ENCOUNTER — Emergency Department (HOSPITAL_COMMUNITY)
Admission: EM | Admit: 2013-02-13 | Discharge: 2013-02-14 | Disposition: A | Discharge status: Home / Self Care | Payer: Medicaid Other | Attending: Emergency Medicine | Admitting: Emergency Medicine

## 2013-02-13 ENCOUNTER — Encounter (HOSPITAL_COMMUNITY): Payer: Self-pay | Admitting: Emergency Medicine

## 2013-02-13 DIAGNOSIS — R51 Headache: Secondary | ICD-10-CM | POA: Insufficient documentation

## 2013-02-13 DIAGNOSIS — R197 Diarrhea, unspecified: Secondary | ICD-10-CM | POA: Insufficient documentation

## 2013-02-13 DIAGNOSIS — K625 Hemorrhage of anus and rectum: Secondary | ICD-10-CM

## 2013-02-13 DIAGNOSIS — R1110 Vomiting, unspecified: Secondary | ICD-10-CM | POA: Insufficient documentation

## 2013-02-13 DIAGNOSIS — Z79899 Other long term (current) drug therapy: Secondary | ICD-10-CM | POA: Insufficient documentation

## 2013-02-13 DIAGNOSIS — Z87891 Personal history of nicotine dependence: Secondary | ICD-10-CM | POA: Insufficient documentation

## 2013-02-13 DIAGNOSIS — K921 Melena: Secondary | ICD-10-CM | POA: Insufficient documentation

## 2013-02-13 DIAGNOSIS — Z3202 Encounter for pregnancy test, result negative: Secondary | ICD-10-CM | POA: Insufficient documentation

## 2013-02-13 DIAGNOSIS — Z9889 Other specified postprocedural states: Secondary | ICD-10-CM | POA: Insufficient documentation

## 2013-02-13 DIAGNOSIS — R109 Unspecified abdominal pain: Secondary | ICD-10-CM | POA: Insufficient documentation

## 2013-02-13 DIAGNOSIS — Z7982 Long term (current) use of aspirin: Secondary | ICD-10-CM | POA: Insufficient documentation

## 2013-02-13 DIAGNOSIS — N898 Other specified noninflammatory disorders of vagina: Secondary | ICD-10-CM | POA: Insufficient documentation

## 2013-02-13 DIAGNOSIS — Z8669 Personal history of other diseases of the nervous system and sense organs: Secondary | ICD-10-CM | POA: Insufficient documentation

## 2013-02-13 MED ORDER — ONDANSETRON 8 MG PO TBDP
8.0000 mg | ORAL_TABLET | Freq: Once | ORAL | Status: AC
  Administered 2013-02-14: 8 mg via ORAL
  Filled 2013-02-13: qty 1

## 2013-02-13 NOTE — ED Notes (Signed)
Pt c/o rectal bleeding, gas pains, vaginal bleeding and vomiting since last night.

## 2013-02-13 NOTE — ED Provider Notes (Signed)
CSN: [id number]     Arrival date & time 02/13/13  2029 History  This chart was scribed for Joya Gaskins, MD by Dorothey Baseman, ED Scribe. This patient was seen in room APA17/APA17 and the patient's care was started at 11:21 PM.    Chief Complaint  Patient presents with  . Rectal Bleeding  . Vaginal Bleeding  . Emesis    Patient is a 47 y.o. female presenting with hematochezia and vomiting. The history is provided by the patient. No language interpreter was used.  Rectal Bleeding Quality:  Unable to specify Amount:  Moderate Timing:  Intermittent Chronicity:  New Context: diarrhea   Similar prior episodes: no   Relieved by:  None tried Worsened by:  Nothing tried Ineffective treatments:  None tried Associated symptoms: abdominal pain and vomiting   Associated symptoms: no fever and no hematemesis   Risk factors: anticoagulant use   Risk factors comment:  Aspirin Emesis Timing:  Sporadic Chronicity:  New Recent urination:  Normal Relieved by:  None tried Worsened by:  Nothing tried Ineffective treatments:  None tried Associated symptoms: abdominal pain, chills, diarrhea and headaches   Associated symptoms: no cough and no fever    HPI Comments: Sandra Carroll is a 47 y.o. female who presents to the Emergency Department complaining of emesis and diarrhea onset last night with episodes this morning. She reports that this morning she believes there was blood present in the stool upon wiping, but denies hematemesis. Patient states that she is currently on her menstrual period. She reports associated abdominal pain and cramping, chills, and headache. She denies fever, chest pain, shortness of breath. Patient reports she had back surgery in September 2013. Patient reports that she is currently on an aspirin regimen, 325 mg PRN.   Past Medical History  Diagnosis Date  . Complication of anesthesia 1991 and 1998    childbirth --reaction to anesthesia was itching.   . Shortness of  breath     quit smoking two years ago and still has some sob with exertion   . Neuromuscular disorder    Past Surgical History  Procedure Laterality Date  . Adenoidectomy  1978  . Knee arthroscopy  2003  . Cesarean section  1991 1998    pt had itching with 1991 c-section.   . Lumbar laminectomy/decompression microdiscectomy  03/02/2012    Procedure: LUMBAR LAMINECTOMY/DECOMPRESSION MICRODISCECTOMY 1 LEVEL;  Surgeon: Carmela Hurt, MD;  Location: [facility];  Service: Neurosurgery;  Laterality: Left;  LEFT Lumbar Five-Sacral One Laminectomy for Decompression    History reviewed. No pertinent family history. History  Substance Use Topics  . Smoking status: Former Games developer  . Smokeless tobacco: Not on file  . Alcohol Use: No   OB History   Grav Para Term Preterm Abortions TAB SAB Ect Mult Living                 Review of Systems  Constitutional: Positive for chills. Negative for fever.  Respiratory: Negative for cough and shortness of breath.   Cardiovascular: Negative for chest pain.  Gastrointestinal: Positive for vomiting, abdominal pain, diarrhea, blood in stool and hematochezia. Negative for hematemesis.       No hematemesis  Neurological: Positive for headaches.  All other systems reviewed and are negative.    Allergies  Review of patient's allergies indicates no known allergies.  Home Medications   Current Outpatient Rx  Name  Route  Sig  Dispense  Refill  . aspirin EC 325  MG tablet   Oral   Take 325 mg by mouth daily.         . ferrous sulfate (IRON SUPPLEMENT) 325 (65 FE) MG tablet   Oral   Take 325 mg by mouth daily with breakfast.         . folic acid (FOLVITE) 800 MCG tablet   Oral   Take 800 mcg by mouth daily.         Marland Kitchen ibuprofen (ADVIL,MOTRIN) 800 MG tablet   Oral   Take 800 mg by mouth every 8 (eight) hours as needed for pain.         Marland Kitchen oxyCODONE-acetaminophen (PERCOCET) 10-325 MG per tablet   Oral   Take 1 tablet by mouth 3 (three)  times daily as needed for pain. For hip pain.         . simvastatin (ZOCOR) 20 MG tablet   Oral   Take 20 mg by mouth every evening.         Marland Kitchen tiZANidine (ZANAFLEX) 4 MG tablet   Oral   Take 4 mg by mouth every 6 (six) hours as needed. For muscle spasms.          Triage Vitals: BP 149/95  Pulse 110  Temp(Src) 98.1 F (36.7 C) (Oral)  Resp 20  Ht 5\' 2"  (1.575 m)  Wt 236 lb (107.049 kg)  BMI 43.15 kg/m2  SpO2 96%  LMP 02/11/2013  Physical Exam  Nursing note and vitals reviewed.  CONSTITUTIONAL: Well developed/well nourished HEAD: Normocephalic/atraumatic EYES: EOMI/PERRL ENMT: Mucous membranes moist NECK: supple no meningeal signs SPINE:entire spine nontender CV: S1/S2 noted, no murmurs/rubs/gallops noted LUNGS: Lungs are clear to auscultation bilaterally, no apparent distress ABDOMEN: soft, nontender, no rebound or guarding GU:no cva tenderness Rectal - no blood or melena noted. No lesions/abscesses noted  Chaperone present.   NEURO: Pt is awake/alert, moves all extremitiesx4 EXTREMITIES: pulses normal, full ROM SKIN: warm, color normal PSYCH: no abnormalities of mood noted  ED Course  Procedures (including critical care time)  DIAGNOSTIC STUDIES: Oxygen Saturation is 96% on room air, normal by my interpretation.    COORDINATION OF CARE: 11:26PM- Will order Zofran, UA, and guaiac test.   Labs Review Labs Reviewed  OCCULT BLOOD, POC DEVICE - Abnormal; Notable for the following:    Fecal Occult Bld POSITIVE (*)    All other components within normal limits  URINALYSIS, ROUTINE W REFLEX MICROSCOPIC  POCT PREGNANCY, URINE   pt had hemoccult positive but stool color was brownish.  Doubt acute GI bleed Also, she reports some vag bleeding but report she is on her menstrual cycle Her abdominal exam is unremarkable Referred to GI Pt requesting d/c home   MDM  No diagnosis found. Nursing notes including past medical history and social history reviewed and  considered in documentation Labs/vital reviewed and considered   I personally performed the services described in this documentation, which was scribed in my presence. The recorded information has been reviewed and is accurate.      Joya Gaskins, MD 02/14/13 [id number]

## 2013-02-14 ENCOUNTER — Ambulatory Visit (INDEPENDENT_AMBULATORY_CARE_PROVIDER_SITE_OTHER): Payer: Medicaid Other | Admitting: Gastroenterology

## 2013-02-14 ENCOUNTER — Encounter: Payer: Self-pay | Admitting: Gastroenterology

## 2013-02-14 VITALS — BP 118/73 | HR 83 | Temp 97.0°F | Ht 63.0 in | Wt 237.4 lb

## 2013-02-14 DIAGNOSIS — K5900 Constipation, unspecified: Secondary | ICD-10-CM

## 2013-02-14 DIAGNOSIS — K625 Hemorrhage of anus and rectum: Secondary | ICD-10-CM

## 2013-02-14 LAB — URINE MICROSCOPIC-ADD ON

## 2013-02-14 LAB — URINALYSIS, ROUTINE W REFLEX MICROSCOPIC
Bilirubin Urine: NEGATIVE
Glucose, UA: NEGATIVE mg/dL
Protein, ur: NEGATIVE mg/dL
Specific Gravity, Urine: >1.03 — ABNORMAL HIGH (ref 1.005–1.030)

## 2013-02-14 LAB — POCT I-STAT, CHEM 8
BUN: 12 mg/dL (ref 6–23)
Calcium, Ion: 1.37 mmol/L — ABNORMAL HIGH (ref 1.12–1.23)
Chloride: 107 mEq/L (ref 96–112)
Creatinine, Ser: 0.8 mg/dL (ref 0.50–1.10)
Glucose, Bld: 113 mg/dL — ABNORMAL HIGH (ref 70–99)
Potassium: 3.5 mEq/L (ref 3.5–5.1)

## 2013-02-14 MED ORDER — HYDROCORTISONE 2.5 % RE CREA: TOPICAL_CREAM | Freq: Two times a day (BID) | RECTAL | Status: DC

## 2013-02-14 MED ORDER — LINACLOTIDE 290 MCG PO CAPS: 290.0000 ug | ORAL_CAPSULE | Freq: Every day | ORAL | Status: DC

## 2013-02-14 NOTE — Patient Instructions (Addendum)
Start taking Linzess 1 capsule each morning on an empty stomach. This is for constipation. Do not start until Sunday or Monday.  I sent suppositories to your pharmacy to help calm down the rectal bleeding. Take these twice a day for 7 days.   If loose stool persist, please call us.  Finally, we have scheduled you for a colonoscopy with Dr. Jena Gauss in the near future.

## 2013-02-14 NOTE — Progress Notes (Signed)
Primary Care Physician:  Samuel Jester, DO Primary Gastroenterologist:  Dr. Jena Gauss   Chief Complaint  Patient presents with  . Rectal Bleeding    HPI:   47 year old female presents today as a referral from the ED secondary to acute rectal bleeding. She has a history of chronic constipation, going days without a BM, then progresses to small balls, then profuse diarrhea. This cycle continues every few days. Ate deep-fried tilapia and hushpuppies Tuesday night. Abdominal pain like lightning, caused so much nausea that she was heaving. Then had diarrhea. While wiping, had blood. Was vomiting/diarrhea at same time. Told she had hemorrhoids from the ED. Last month, bowel movements felt like they were cutting her when coming out. Paper hematochezia. Ate cereal this morning. Stomach still queasy. Rumbling this morning. 45 minutes had diarrhea. Normal baseline is constipation. Lots of gas lately. Episodes of gas pain have happened before but not as bad. Smell is horrible.   Doesn't go to bathroom several days, then will have small little pieces then diarrhea. Worse in past 5 years. Chronic. Overall, she feels like acute episode is improving.   Past Medical History  Diagnosis Date  . Complication of anesthesia 1991 and 1998    childbirth --reaction to anesthesia was itching.   . Shortness of breath     quit smoking two years ago and still has some sob with exertion   . Chronic back pain   . DDD (degenerative disc disease)     Past Surgical History  Procedure Laterality Date  . Adenoidectomy  1978  . Knee arthroscopy  2003  . Cesarean section  1991 1998    pt had itching with 1991 c-section.   . Lumbar laminectomy/decompression microdiscectomy  03/02/2012    Procedure: LUMBAR LAMINECTOMY/DECOMPRESSION MICRODISCECTOMY 1 LEVEL;  Surgeon: Carmela Hurt, MD;  Location: [facility];  Service: Neurosurgery;  Laterality: Left;  LEFT Lumbar Five-Sacral One Laminectomy for Decompression      Current Outpatient Prescriptions  Medication Sig Dispense Refill  . aspirin EC 325 MG tablet Take 325 mg by mouth daily.      . ferrous sulfate (IRON SUPPLEMENT) 325 (65 FE) MG tablet Take 325 mg by mouth daily with breakfast.      . folic acid (FOLVITE) 800 MCG tablet Take 800 mcg by mouth daily.      Marland Kitchen ibuprofen (ADVIL,MOTRIN) 800 MG tablet Take 800 mg by mouth every 8 (eight) hours as needed for pain.      Marland Kitchen oxyCODONE-acetaminophen (PERCOCET) 10-325 MG per tablet Take 1 tablet by mouth 3 (three) times daily as needed for pain. For hip pain.      . simvastatin (ZOCOR) 20 MG tablet Take 20 mg by mouth every evening.      Marland Kitchen tiZANidine (ZANAFLEX) 4 MG tablet Take 4 mg by mouth every 6 (six) hours as needed. For muscle spasms.       No current facility-administered medications for this visit.    Allergies as of 02/14/2013  . (No Known Allergies)    Family History  Problem Relation Age of Onset  . Colon cancer Neg Hx     History   Social History  . Marital Status: Divorced    Spouse Name: N/A    Number of Children: N/A  . Years of Education: N/A   Occupational History  . Not on file.   Social History Main Topics  . Smoking status: Former Games developer  . Smokeless tobacco: Not on file  . Alcohol Use:  No  . Drug Use: No  . Sexual Activity: No   Other Topics Concern  . Not on file   Social History Narrative  . No narrative on file    Review of Systems: Negative unless mentioned above.   Physical Exam: BP 118/73  Pulse 83  Temp(Src) 97 F (36.1 C) (Oral)  Ht 5\' 3"  (1.6 m)  Wt 237 lb 6.4 oz (107.684 kg)  BMI 42.06 kg/m2  LMP 02/11/2013 General:   Alert and oriented. Pleasant and cooperative. Well-nourished and well-developed.  Head:  Normocephalic and atraumatic. Eyes:  Without icterus, sclera clear and conjunctiva pink.  Ears:  Normal auditory acuity. Nose:  No deformity, discharge,  or lesions. Mouth:  No deformity or lesions, oral mucosa pink.  Neck:   Supple, without mass or thyromegaly. Lungs:  Clear to auscultation bilaterally. No wheezes, rales, or rhonchi. No distress.  Heart:  S1, S2 present without murmurs appreciated.  Abdomen:  +BS, soft, non-tender and non-distended. No HSM noted. No guarding or rebound. No masses appreciated.  Rectal:  Deferred until colonoscopy Msk:  Symmetrical without gross deformities. Normal posture. Extremities:  Without clubbing or edema. Neurologic:  Alert and  oriented x4;  grossly normal neurologically. Skin:  Intact without significant lesions or rashes. Cervical Nodes:  No significant cervical adenopathy. Psych:  Alert and cooperative. Normal mood and affect.  Lab Results  Component Value Date   HGB 14.3 02/14/2013

## 2013-02-15 LAB — URINE CULTURE
Colony Count: NO GROWTH
Culture: NO GROWTH

## 2013-02-16 DIAGNOSIS — K5900 Constipation, unspecified: Secondary | ICD-10-CM | POA: Insufficient documentation

## 2013-02-16 DIAGNOSIS — K625 Hemorrhage of anus and rectum: Secondary | ICD-10-CM | POA: Insufficient documentation

## 2013-02-16 NOTE — Assessment & Plan Note (Signed)
Chronic. Her cycle of bowel habits includes days without a BM, followed by small and incomplete evacuation, culminating finally to a large bout of diarrhea. I feel started her on a more aggressive bowel regimen could stop this cycle and offer more complete evacuations on a more regular basis. Her recent acute illness of N/V/D is improving, and I have asked her to start Linzess in about 3-5 days, once she has returned to baseline. Linzess 290 mcg daily provided.

## 2013-02-16 NOTE — Assessment & Plan Note (Signed)
47 year old female with intermittent low-volume hematochezia in the setting of known chronic constipation, with recent self-limiting illness likely secondary to food choices (fried hushpuppies and tilapia). She seems to be improving from this bout of nausea, vomiting, and diarrhea, and she is returning to her baseline of constipation. Query benign anorectal source, and she may have an occult anal fissure as well due to her description of symptoms at times. Needs lower GI evaluation in the near future for further assessment.   Proceed with TCS with Dr. Jena Gauss in near future: the risks, benefits, and alternatives have been discussed with the patient in detail. The patient states understanding and desires to proceed. Anusol suppositories provided.

## 2013-02-19 NOTE — Progress Notes (Signed)
CC'd to PCP 

## 2013-02-20 ENCOUNTER — Telehealth: Payer: Self-pay

## 2013-02-20 NOTE — Telephone Encounter (Signed)
Received fax from pharmacy. Pt has [place] medicaid, she has to try and fail amitiza prior to them approving Linzess.

## 2013-02-20 NOTE — Telephone Encounter (Signed)
Pt seen on 8/28 and called today to set up her tcs. I told her that LAW would be back in the morning and that I would have her return her call to get procedure set up. [phone]

## 2013-02-21 ENCOUNTER — Other Ambulatory Visit: Payer: Self-pay | Admitting: Internal Medicine

## 2013-02-21 DIAGNOSIS — K5900 Constipation, unspecified: Secondary | ICD-10-CM

## 2013-02-21 DIAGNOSIS — K625 Hemorrhage of anus and rectum: Secondary | ICD-10-CM

## 2013-02-21 MED ORDER — PEG 3350-KCL-NA BICARB-NACL 420 G PO SOLR: 4000.0000 mL | ORAL | Status: DC

## 2013-02-21 NOTE — Telephone Encounter (Signed)
I have Sandra Carroll scheduled with RMR on Friday Sept 12 at 11:15 & she is aware of this, I have put her instructions in the mail to her

## 2013-02-22 ENCOUNTER — Encounter (HOSPITAL_COMMUNITY): Payer: Self-pay | Admitting: Pharmacy Technician

## 2013-03-01 ENCOUNTER — Ambulatory Visit (HOSPITAL_COMMUNITY)
Admission: RE | Admit: 2013-03-01 | Discharge: 2013-03-01 | Disposition: A | Discharge status: Home / Self Care | Payer: Medicaid Other | Source: Ambulatory Visit | Attending: Internal Medicine | Admitting: Internal Medicine

## 2013-03-01 ENCOUNTER — Encounter (HOSPITAL_COMMUNITY)
Admission: RE | Disposition: A | Discharge status: Home / Self Care | Payer: Self-pay | Source: Ambulatory Visit | Attending: Internal Medicine

## 2013-03-01 ENCOUNTER — Encounter (HOSPITAL_COMMUNITY): Payer: Self-pay | Admitting: *Deleted

## 2013-03-01 DIAGNOSIS — K648 Other hemorrhoids: Secondary | ICD-10-CM

## 2013-03-01 DIAGNOSIS — K5900 Constipation, unspecified: Secondary | ICD-10-CM

## 2013-03-01 DIAGNOSIS — K625 Hemorrhage of anus and rectum: Secondary | ICD-10-CM

## 2013-03-01 DIAGNOSIS — K921 Melena: Secondary | ICD-10-CM | POA: Insufficient documentation

## 2013-03-01 HISTORY — DX: Nausea with vomiting, unspecified: R11.2

## 2013-03-01 HISTORY — PX: COLONOSCOPY: SHX5424

## 2013-03-01 HISTORY — DX: Other specified postprocedural states: Z98.890

## 2013-03-01 SURGERY — COLONOSCOPY
Anesthesia: Moderate Sedation

## 2013-03-01 MED ORDER — ONDANSETRON HCL 4 MG/2ML IJ SOLN
INTRAMUSCULAR | Status: DC | PRN
  Administered 2013-03-01: 4 mg via INTRAVENOUS

## 2013-03-01 MED ORDER — MIDAZOLAM HCL 5 MG/5ML IJ SOLN
INTRAMUSCULAR | Status: DC | PRN
  Administered 2013-03-01: 2 mg via INTRAVENOUS
  Administered 2013-03-01 (×2): 1 mg via INTRAVENOUS
  Administered 2013-03-01: 2 mg via INTRAVENOUS

## 2013-03-01 MED ORDER — ONDANSETRON HCL 4 MG/2ML IJ SOLN
INTRAMUSCULAR | Status: AC
  Filled 2013-03-01: qty 2

## 2013-03-01 MED ORDER — MEPERIDINE HCL 100 MG/ML IJ SOLN
INTRAMUSCULAR | Status: AC
  Filled 2013-03-01: qty 2

## 2013-03-01 MED ORDER — MIDAZOLAM HCL 5 MG/5ML IJ SOLN
INTRAMUSCULAR | Status: AC
  Filled 2013-03-01: qty 10

## 2013-03-01 MED ORDER — SODIUM CHLORIDE 0.9 % IV SOLN
INTRAVENOUS | Status: DC
  Administered 2013-03-01: 11:00:00 via INTRAVENOUS

## 2013-03-01 MED ORDER — MEPERIDINE HCL 100 MG/ML IJ SOLN
INTRAMUSCULAR | Status: DC | PRN
  Administered 2013-03-01: 25 mg via INTRAVENOUS
  Administered 2013-03-01: 50 mg via INTRAVENOUS
  Administered 2013-03-01: 25 mg via INTRAVENOUS

## 2013-03-01 MED ORDER — STERILE WATER FOR IRRIGATION IR SOLN
Status: DC | PRN
  Administered 2013-03-01: 11:00:00

## 2013-03-01 NOTE — Interval H&P Note (Signed)
History and Physical Interval Note:  03/01/2013 11:01 AM  Sandra Carroll  has presented today for surgery, with the diagnosis of RECTAL BLEEDING AND CONSTIPATION  The various methods of treatment have been discussed with the patient and family. After consideration of risks, benefits and other options for treatment, the patient has consented to  Procedure(s) with comments: COLONOSCOPY (N/A) - 11:00 as a surgical intervention .  The patient's history has been reviewed, patient examined, no change in status, stable for surgery.  I have reviewed the patient's chart and labs.  Questions were answered to the patient's satisfaction.     Nausea and vomiting resolved. Linzess has been of great help in facilitating bowel function. Has intermittently prolapsing "hemorrhoids" and chronic paper hematochezia. I discussed, depending on findings, hemorrhoid banding subsequent to colonoscopy today.  The risks, benefits, limitations, alternatives and imponderables have been reviewed with the patient. Questions have been answered. All parties are agreeable.    Eula Listen

## 2013-03-01 NOTE — Op Note (Signed)
[facility] [address] [place], [address]   COLONOSCOPY PROCEDURE REPORT  PATIENT: Sandra Carroll, Sandra Carroll  MR#:         [id number] BIRTHDATE: 1966-01-21 , 47  yrs. old GENDER: Female ENDOSCOPIST: R.  Roetta Sessions, MD FACP [facility] REFERRED BY:  Samuel Jester PROCEDURE DATE:  03/01/2013 PROCEDURE:     Diagnostic colonoscopy  INDICATIONS: hematochezia  INFORMED CONSENT:  The risks, benefits, alternatives and imponderables including but not limited to bleeding, perforation as well as the possibility of a missed lesion have been reviewed.  The potential for biopsy, lesion removal, etc. have also been discussed.  Questions have been answered.  All parties agreeable. Please see the history and physical in the medical record for more information.  MEDICATIONS: Versed 6 mg IV and Demerol 100 mg IV in divided doses. Zofran 4 mg IV  DESCRIPTION OF PROCEDURE:  After a digital rectal exam was performed, the EC-[id number] ([id number])  colonoscope was advanced from the anus through the rectum and colon to the area of the cecum, ileocecal valve and appendiceal orifice.  The cecum was deeply intubated.  These structures were well-seen and photographed for the record.  From the level of the cecum and ileocecal valve, the scope was slowly and cautiously withdrawn.  The mucosal surfaces were carefully surveyed utilizing scope tip deflection to facilitate fold flattening as needed.  The scope was pulled down into the rectum where a thorough examination including retroflexion was performed.    FINDINGS:  adequate preparation.  Normal rectum except for anal canal/internal hemorrhoidal tags.   colonic mucosa appeared normal.  THERAPEUTIC / DIAGNOSTIC MANEUVERS PERFORMED:  None  COMPLICATIONS: None  CECAL WITHDRAWAL TIME:  10 minutes  IMPRESSION:  Internal hemorrhoids-likely source of hematochezia  RECOMMENDATIONS: We'll schedule a hemorrhoid banding session in the office in the  near future. Repeat colonoscopy for screening purposes in 10 years   _______________________________ eSigned:  R. Roetta Sessions, MD FACP [facility] 03/01/2013 11:42 AM   CC:

## 2013-03-01 NOTE — H&P (View-Only) (Signed)
 Primary Care Physician:  BUTLER, [name], DO Primary Gastroenterologist:  Dr. Rourk   Chief Complaint  Patient presents with  . Rectal Bleeding    HPI:   47-year-old female presents today as a referral from the ED secondary to acute rectal bleeding. She has a history of chronic constipation, going days without a BM, then progresses to small balls, then profuse diarrhea. This cycle continues every few days. Ate deep-fried tilapia and hushpuppies Tuesday night. Abdominal pain like lightning, caused so much nausea that she was heaving. Then had diarrhea. While wiping, had blood. Was vomiting/diarrhea at same time. Told she had hemorrhoids from the ED. Last month, bowel movements felt like they were cutting her when coming out. Paper hematochezia. Ate cereal this morning. Stomach still queasy. Rumbling this morning. 45 minutes had diarrhea. Normal baseline is constipation. Lots of gas lately. Episodes of gas pain have happened before but not as bad. Smell is horrible.   Doesn't go to bathroom several days, then will have small little pieces then diarrhea. Worse in past 5 years. Chronic. Overall, she feels like acute episode is improving.   Past Medical History  Diagnosis Date  . Complication of anesthesia 1991 and 1998    childbirth --reaction to anesthesia was itching.   . Shortness of breath     quit smoking two years ago and still has some sob with exertion   . Chronic back pain   . DDD (degenerative disc disease)     Past Surgical History  Procedure Laterality Date  . Adenoidectomy  1978  . Knee arthroscopy  2003  . Cesarean section  1991 1998    pt had itching with 1991 c-section.   . Lumbar laminectomy/decompression microdiscectomy  03/02/2012    Procedure: LUMBAR LAMINECTOMY/DECOMPRESSION MICRODISCECTOMY 1 LEVEL;  Surgeon: Kyle L Cabbell, MD;  Location: [facility];  Service: Neurosurgery;  Laterality: Left;  LEFT Lumbar Five-Sacral One Laminectomy for Decompression      Current Outpatient Prescriptions  Medication Sig Dispense Refill  . aspirin EC 325 MG tablet Take 325 mg by mouth daily.      . ferrous sulfate (IRON SUPPLEMENT) 325 (65 FE) MG tablet Take 325 mg by mouth daily with breakfast.      . folic acid (FOLVITE) 800 MCG tablet Take 800 mcg by mouth daily.      . ibuprofen (ADVIL,MOTRIN) 800 MG tablet Take 800 mg by mouth every 8 (eight) hours as needed for pain.      . oxyCODONE-acetaminophen (PERCOCET) 10-325 MG per tablet Take 1 tablet by mouth 3 (three) times daily as needed for pain. For hip pain.      . simvastatin (ZOCOR) 20 MG tablet Take 20 mg by mouth every evening.      . tiZANidine (ZANAFLEX) 4 MG tablet Take 4 mg by mouth every 6 (six) hours as needed. For muscle spasms.       No current facility-administered medications for this visit.    Allergies as of 02/14/2013  . (No Known Allergies)    Family History  Problem Relation Age of Onset  . Colon cancer Neg Hx     History   Social History  . Marital Status: Divorced    Spouse Name: N/A    Number of Children: N/A  . Years of Education: N/A   Occupational History  . Not on file.   Social History Main Topics  . Smoking status: Former Smoker  . Smokeless tobacco: Not on file  . Alcohol Use:   No  . Drug Use: No  . Sexual Activity: No   Other Topics Concern  . Not on file   Social History Narrative  . No narrative on file    Review of Systems: Negative unless mentioned above.   Physical Exam: BP 118/73  Pulse 83  Temp(Src) 97 F (36.1 C) (Oral)  Ht 5' 3" (1.6 m)  Wt 237 lb 6.4 oz (107.684 kg)  BMI 42.06 kg/m2  LMP 02/11/2013 General:   Alert and oriented. Pleasant and cooperative. Well-nourished and well-developed.  Head:  Normocephalic and atraumatic. Eyes:  Without icterus, sclera clear and conjunctiva pink.  Ears:  Normal auditory acuity. Nose:  No deformity, discharge,  or lesions. Mouth:  No deformity or lesions, oral mucosa pink.  Neck:   Supple, without mass or thyromegaly. Lungs:  Clear to auscultation bilaterally. No wheezes, rales, or rhonchi. No distress.  Heart:  S1, S2 present without murmurs appreciated.  Abdomen:  +BS, soft, non-tender and non-distended. No HSM noted. No guarding or rebound. No masses appreciated.  Rectal:  Deferred until colonoscopy Msk:  Symmetrical without gross deformities. Normal posture. Extremities:  Without clubbing or edema. Neurologic:  Alert and  oriented x4;  grossly normal neurologically. Skin:  Intact without significant lesions or rashes. Cervical Nodes:  No significant cervical adenopathy. Psych:  Alert and cooperative. Normal mood and affect.  Lab Results  Component Value Date   HGB 14.3 02/14/2013      

## 2013-03-05 ENCOUNTER — Encounter (HOSPITAL_COMMUNITY): Payer: Self-pay | Admitting: Internal Medicine

## 2013-03-07 MED ORDER — LUBIPROSTONE 24 MCG PO CAPS: 24.0000 ug | ORAL_CAPSULE | Freq: Two times a day (BID) | ORAL | Status: DC

## 2013-03-07 NOTE — Telephone Encounter (Signed)
Sent in Amitiza 24 mcg po BID to pharmacy.

## 2013-03-07 NOTE — Telephone Encounter (Signed)
Tobi Bastos, please see note about Linzess.

## 2013-03-13 ENCOUNTER — Encounter: Payer: Medicaid Other | Admitting: Internal Medicine

## 2013-03-27 ENCOUNTER — Encounter (INDEPENDENT_AMBULATORY_CARE_PROVIDER_SITE_OTHER): Payer: Self-pay

## 2013-03-27 ENCOUNTER — Encounter: Payer: Self-pay | Admitting: Internal Medicine

## 2013-03-27 ENCOUNTER — Ambulatory Visit (INDEPENDENT_AMBULATORY_CARE_PROVIDER_SITE_OTHER): Payer: Medicaid Other | Admitting: Internal Medicine

## 2013-03-27 VITALS — BP 149/94 | HR 76 | Temp 98.4°F | Ht 62.0 in | Wt 240.8 lb

## 2013-03-27 DIAGNOSIS — K648 Other hemorrhoids: Secondary | ICD-10-CM

## 2013-03-27 NOTE — Patient Instructions (Signed)
Take Amitiza as directed for constipation  Benefiber 2 teaspoons twice daily.  Avoid straining. Office visit with [place] in 2-3 weeks  Call with any interim problems

## 2013-03-27 NOTE — Progress Notes (Signed)
[facility] banding procedure note:  The patient presents with symptomatic grade 1 hemorrhoids, unresponsive to maximal medical therapy, requesting rubber band ligation of her hemorrhoidal disease. All risks, benefits, and alternative forms of therapy were described and informed consent was obtained.  In the left lateral decubitus position  anoscopic examination revealed a prominent hemorrhoid in the right anterior position. No other abnormalities seen The decision was made to band the right anterior internal hemorrhoid, and the [facility] O'Regan System was used to perform band ligation without complication. Digital anorectal examination was then performed to assure proper positioning of the band, and to adjust the banded tissue as required. The band was palpated to be in proper position. Patient is experiencing no pinching or pain following deployment. The patient was discharged home without pain or other issues. Dietary and behavioral recommendations were given along with follow-up instructions. The patient will return in for followup and possible additional banding as required.  No complications were encountered and the patient tolerated the procedure well.

## 2013-04-18 ENCOUNTER — Ambulatory Visit: Payer: Medicaid Other | Admitting: Gastroenterology

## 2013-04-24 ENCOUNTER — Encounter: Payer: Medicaid Other | Admitting: Internal Medicine

## 2013-05-21 ENCOUNTER — Encounter (INDEPENDENT_AMBULATORY_CARE_PROVIDER_SITE_OTHER): Payer: Self-pay

## 2013-05-21 ENCOUNTER — Ambulatory Visit (INDEPENDENT_AMBULATORY_CARE_PROVIDER_SITE_OTHER): Payer: Medicaid Other | Admitting: Internal Medicine

## 2013-05-21 ENCOUNTER — Encounter: Payer: Self-pay | Admitting: Internal Medicine

## 2013-05-21 VITALS — BP 128/75 | HR 71 | Temp 98.1°F | Wt 246.4 lb

## 2013-05-21 DIAGNOSIS — K648 Other hemorrhoids: Secondary | ICD-10-CM

## 2013-05-21 NOTE — Progress Notes (Signed)
[facility] banding procedure note:  The patient presents with symptomatic unresponsive to maximal medical therapy, requesting rubber band ligation of his/her hemorrhoidal disease. All risks, benefits, and alternative forms of therapy were described and informed consent was obtained. Her first band placed on the right anterior pile a few weeks ago. We added Amitiza to her regimen. She's had no further constipation or bleeding. She is pleased with her progress.  In the left lateral decubitus position, a digital rectal exam was performed. The decision was made to band the left lateral pile. The [facility] O'Regan System was used to perform band ligation without complication. Digital anorectal examination was then performed to assure proper positioning of the band, and to adjust the banded tissue as required. Patient had no pain after application of the band. Digital examination of the band to be appropriately placed. The patient was discharged home without pain or other issues. Dietary and behavioral recommendations were given and (if necessary prescriptions were given), along with follow-up instructions. The patient will return in 2-3 weeks for followup and possible additional banding as required.  No complications were encountered and the patient tolerated the procedure well.

## 2013-05-21 NOTE — Patient Instructions (Signed)
Continue Amitiza.  Avoid straining.  Limit tolet time.  Return visit in 2-3 weeks  Call if any problems.

## 2013-06-11 ENCOUNTER — Telehealth: Payer: Self-pay | Admitting: *Deleted

## 2013-06-11 MED ORDER — LINACLOTIDE 290 MCG PO CAPS: 290.0000 ug | ORAL_CAPSULE | Freq: Every day | ORAL | Status: DC

## 2013-06-11 NOTE — Addendum Note (Signed)
Addended by: Nira Retort on: 06/11/2013 12:30 PM   Modules accepted: Orders

## 2013-06-11 NOTE — Telephone Encounter (Signed)
PA done and sent to Medicaid/

## 2013-06-11 NOTE — Telephone Encounter (Signed)
Pt stated she has been feeling nausea pt is taking amitza. Pt thinks she is backed up, pt went to her family doctor and PCP gave her nausea pill promethazine. Pt has been throwing up, top of her stomach feels like it's on fire, pt feel gassy. Please advise [phone]

## 2013-06-11 NOTE — Telephone Encounter (Signed)
Glad to hear! I sent Linzess to the pharmacy.

## 2013-06-11 NOTE — Telephone Encounter (Signed)
Spoke with pt- she said she feels like she is not getting cleaned out good enough with the amitiza and its causing her nausea. She is having bm's but she doesn't feel like it is all coming out. Advised pt that AS had wanted her to try Linzess at her ov but the insurance wouldn't pay for it and that is why we had to change her to [place]. Pt would like to try Linzess again. I will do PA but pt will need new rx for linzess sent to the pharmacy.

## 2013-06-11 NOTE — Telephone Encounter (Signed)
PA for Linzess has been approved by [facility] medicaid.

## 2013-06-25 ENCOUNTER — Encounter (INDEPENDENT_AMBULATORY_CARE_PROVIDER_SITE_OTHER): Payer: Self-pay

## 2013-06-25 ENCOUNTER — Encounter: Payer: Self-pay | Admitting: Internal Medicine

## 2013-06-25 ENCOUNTER — Ambulatory Visit (INDEPENDENT_AMBULATORY_CARE_PROVIDER_SITE_OTHER): Payer: Medicaid Other | Admitting: Internal Medicine

## 2013-06-25 VITALS — BP 141/87 | HR 114 | Temp 97.4°F | Ht 62.0 in | Wt 241.8 lb

## 2013-06-25 DIAGNOSIS — K648 Other hemorrhoids: Secondary | ICD-10-CM | POA: Insufficient documentation

## 2013-06-25 NOTE — Patient Instructions (Signed)
Avoid straining.  Limit toilet time to 2-3 minutes  Decrease Linzess to the 145 capsule  - 1 daily.  Call with any interim problems  Schedule followup appointment in 3 months for recheck

## 2013-06-25 NOTE — Progress Notes (Signed)
[facility] banding procedure note:  Bleeding has steadily improved after having the right anterior and left lateral hemorrhoid banded. Having some diarrhea with Linzess 290 Banding of right posterior hemorrhoid today. In the left lateral decubitus position  Digital rectal exam revealed no abnormalities. Inserting the [facility] device , the right posterior hemorrhoid was targeted.Band was applied. Followup digital rectal exam revealed the band was  in appropriate position. No pinching or pain following this maneuver. Dietary and behavioral recommendations were given .  No apparent complications.  See discharge instructions.

## 2013-10-07 ENCOUNTER — Other Ambulatory Visit: Payer: Self-pay

## 2013-10-07 MED ORDER — LINACLOTIDE 290 MCG PO CAPS: 290.0000 ug | ORAL_CAPSULE | Freq: Every day | ORAL | Status: DC

## 2014-04-09 ENCOUNTER — Other Ambulatory Visit: Payer: Self-pay | Admitting: Gastroenterology

## 2014-07-24 ENCOUNTER — Encounter (HOSPITAL_COMMUNITY): Payer: Self-pay

## 2014-07-24 ENCOUNTER — Inpatient Hospital Stay (HOSPITAL_COMMUNITY)
Admission: EM | Admit: 2014-07-24 | Discharge: 2014-07-27 | DRG: 392 | Disposition: A | Discharge status: Home / Self Care | Payer: Self-pay | Attending: Family Medicine | Admitting: Family Medicine

## 2014-07-24 DIAGNOSIS — R21 Rash and other nonspecific skin eruption: Secondary | ICD-10-CM

## 2014-07-24 DIAGNOSIS — L509 Urticaria, unspecified: Secondary | ICD-10-CM

## 2014-07-24 DIAGNOSIS — A084 Viral intestinal infection, unspecified: Principal | ICD-10-CM | POA: Diagnosis present

## 2014-07-24 DIAGNOSIS — R1010 Upper abdominal pain, unspecified: Secondary | ICD-10-CM

## 2014-07-24 DIAGNOSIS — K5900 Constipation, unspecified: Secondary | ICD-10-CM | POA: Diagnosis present

## 2014-07-24 DIAGNOSIS — K648 Other hemorrhoids: Secondary | ICD-10-CM

## 2014-07-24 DIAGNOSIS — L299 Pruritus, unspecified: Secondary | ICD-10-CM | POA: Diagnosis present

## 2014-07-24 DIAGNOSIS — R112 Nausea with vomiting, unspecified: Secondary | ICD-10-CM

## 2014-07-24 DIAGNOSIS — K625 Hemorrhage of anus and rectum: Secondary | ICD-10-CM | POA: Diagnosis present

## 2014-07-24 DIAGNOSIS — Z87891 Personal history of nicotine dependence: Secondary | ICD-10-CM

## 2014-07-24 DIAGNOSIS — Z8249 Family history of ischemic heart disease and other diseases of the circulatory system: Secondary | ICD-10-CM

## 2014-07-24 DIAGNOSIS — K529 Noninfective gastroenteritis and colitis, unspecified: Secondary | ICD-10-CM | POA: Diagnosis present

## 2014-07-24 DIAGNOSIS — G8929 Other chronic pain: Secondary | ICD-10-CM | POA: Diagnosis present

## 2014-07-24 NOTE — ED Notes (Signed)
Upper abd pain onset tonight after she ate meatloaf at home.  Pt received 2 mg zofran

## 2014-07-25 ENCOUNTER — Emergency Department (HOSPITAL_COMMUNITY): Payer: Medicaid Other

## 2014-07-25 ENCOUNTER — Encounter (HOSPITAL_COMMUNITY): Payer: Self-pay | Admitting: *Deleted

## 2014-07-25 DIAGNOSIS — R21 Rash and other nonspecific skin eruption: Secondary | ICD-10-CM

## 2014-07-25 DIAGNOSIS — R112 Nausea with vomiting, unspecified: Secondary | ICD-10-CM

## 2014-07-25 DIAGNOSIS — R1010 Upper abdominal pain, unspecified: Secondary | ICD-10-CM

## 2014-07-25 DIAGNOSIS — K625 Hemorrhage of anus and rectum: Secondary | ICD-10-CM

## 2014-07-25 DIAGNOSIS — K529 Noninfective gastroenteritis and colitis, unspecified: Secondary | ICD-10-CM | POA: Diagnosis present

## 2014-07-25 DIAGNOSIS — K648 Other hemorrhoids: Secondary | ICD-10-CM

## 2014-07-25 LAB — CBC
HCT: 36 % (ref 36.0–46.0)
HEMATOCRIT: 36.6 % (ref 36.0–46.0)
HEMOGLOBIN: 12.1 g/dL (ref 12.0–15.0)
HEMOGLOBIN: 12 g/dL (ref 12.0–15.0)
MCH: 31.1 pg (ref 26.0–34.0)
MCH: 31.3 pg (ref 26.0–34.0)
MCHC: 33.1 g/dL (ref 30.0–36.0)
MCHC: 33.3 g/dL (ref 30.0–36.0)
MCV: 94.1 fL (ref 78.0–100.0)
MCV: 93.8 fL (ref 78.0–100.0)
Platelets: 244 10*3/uL (ref 150–400)
Platelets: 238 10*3/uL (ref 150–400)
RBC: 3.89 MIL/uL (ref 3.87–5.11)
RBC: 3.84 MIL/uL — AB (ref 3.87–5.11)
RDW: 13.1 % (ref 11.5–15.5)
RDW: 13.1 % (ref 11.5–15.5)
WBC: 10.7 10*3/uL — AB (ref 4.0–10.5)
WBC: 11.5 10*3/uL — ABNORMAL HIGH (ref 4.0–10.5)

## 2014-07-25 LAB — CBC WITH DIFFERENTIAL/PLATELET
BASOS PCT: 0 % (ref 0–1)
Basophils Absolute: 0 10*3/uL (ref 0.0–0.1)
EOS PCT: 0 % (ref 0–5)
Eosinophils Absolute: 0 10*3/uL (ref 0.0–0.7)
HCT: 43.1 % (ref 36.0–46.0)
HEMOGLOBIN: 14.5 g/dL (ref 12.0–15.0)
LYMPHS ABS: 1.3 10*3/uL (ref 0.7–4.0)
LYMPHS PCT: 13 % (ref 12–46)
MCH: 31.9 pg (ref 26.0–34.0)
MCHC: 33.6 g/dL (ref 30.0–36.0)
MCV: 94.7 fL (ref 78.0–100.0)
Monocytes Absolute: 0.4 10*3/uL (ref 0.1–1.0)
Monocytes Relative: 3 % (ref 3–12)
NEUTROS PCT: 84 % — AB (ref 43–77)
Neutro Abs: 8.8 10*3/uL — ABNORMAL HIGH (ref 1.7–7.7)
PLATELETS: 234 10*3/uL (ref 150–400)
RBC: 4.55 MIL/uL (ref 3.87–5.11)
RDW: 13 % (ref 11.5–15.5)
WBC: 10.4 10*3/uL (ref 4.0–10.5)

## 2014-07-25 LAB — URINE MICROSCOPIC-ADD ON

## 2014-07-25 LAB — COMPREHENSIVE METABOLIC PANEL
ALT: 16 U/L (ref 0–35)
ANION GAP: 5 (ref 5–15)
AST: 27 U/L (ref 0–37)
Albumin: 3.4 g/dL — ABNORMAL LOW (ref 3.5–5.2)
Alkaline Phosphatase: 63 U/L (ref 39–117)
BILIRUBIN TOTAL: 0.5 mg/dL (ref 0.3–1.2)
BUN: 12 mg/dL (ref 6–23)
CALCIUM: 9.3 mg/dL (ref 8.4–10.5)
CO2: 22 mmol/L (ref 19–32)
Chloride: 113 mmol/L — ABNORMAL HIGH (ref 96–112)
Creatinine, Ser: 0.95 mg/dL (ref 0.50–1.10)
GFR calc non Af Amer: 71 mL/min — ABNORMAL LOW (ref >90)
GFR, EST AFRICAN AMERICAN: 82 mL/min — AB (ref >90)
GLUCOSE: 128 mg/dL — AB (ref 70–99)
POTASSIUM: 3.7 mmol/L (ref 3.5–5.1)
SODIUM: 140 mmol/L (ref 135–145)
TOTAL PROTEIN: 7.3 g/dL (ref 6.0–8.3)

## 2014-07-25 LAB — URINALYSIS, ROUTINE W REFLEX MICROSCOPIC
GLUCOSE, UA: NEGATIVE mg/dL
Leukocytes, UA: NEGATIVE
Nitrite: NEGATIVE
Protein, ur: 30 mg/dL — AB
Urobilinogen, UA: 0.2 mg/dL (ref 0.0–1.0)
pH: 6 (ref 5.0–8.0)

## 2014-07-25 LAB — SAMPLE TO BLOOD BANK

## 2014-07-25 LAB — POC OCCULT BLOOD, ED: Fecal Occult Bld: POSITIVE — AB

## 2014-07-25 LAB — CLOSTRIDIUM DIFFICILE BY PCR: CDIFFPCR: NEGATIVE

## 2014-07-25 LAB — LIPASE, BLOOD: Lipase: 26 U/L (ref 11–59)

## 2014-07-25 MED ORDER — FENTANYL CITRATE 0.05 MG/ML IJ SOLN
25.0000 ug | Freq: Once | INTRAMUSCULAR | Status: AC
  Administered 2014-07-25: 25 ug via INTRAVENOUS
  Filled 2014-07-25: qty 2

## 2014-07-25 MED ORDER — FAMOTIDINE IN NACL 20-0.9 MG/50ML-% IV SOLN
20.0000 mg | Freq: Once | INTRAVENOUS | Status: AC
  Administered 2014-07-25: 20 mg via INTRAVENOUS
  Filled 2014-07-25: qty 50

## 2014-07-25 MED ORDER — ONDANSETRON HCL 4 MG/2ML IJ SOLN
INTRAMUSCULAR | Status: AC
  Filled 2014-07-25: qty 2

## 2014-07-25 MED ORDER — ONDANSETRON HCL 4 MG/2ML IJ SOLN
4.0000 mg | Freq: Once | INTRAMUSCULAR | Status: AC
  Administered 2014-07-25: 4 mg via INTRAVENOUS

## 2014-07-25 MED ORDER — SODIUM CHLORIDE 0.9 % IV SOLN: 250.0000 mL | INTRAVENOUS | Status: DC | PRN

## 2014-07-25 MED ORDER — ONDANSETRON HCL 4 MG/2ML IJ SOLN
4.0000 mg | Freq: Once | INTRAMUSCULAR | Status: AC
  Administered 2014-07-25: 4 mg via INTRAVENOUS
  Filled 2014-07-25: qty 2

## 2014-07-25 MED ORDER — ACETAMINOPHEN 650 MG RE SUPP: 650.0000 mg | Freq: Four times a day (QID) | RECTAL | Status: DC | PRN

## 2014-07-25 MED ORDER — SODIUM CHLORIDE 0.9 % IJ SOLN
3.0000 mL | Freq: Two times a day (BID) | INTRAMUSCULAR | Status: DC
  Administered 2014-07-25 – 2014-07-27 (×4): 3 mL via INTRAVENOUS

## 2014-07-25 MED ORDER — ONDANSETRON HCL 4 MG PO TABS
4.0000 mg | ORAL_TABLET | Freq: Four times a day (QID) | ORAL | Status: DC | PRN
  Filled 2014-07-25: qty 1

## 2014-07-25 MED ORDER — HYDROCORTISONE 2.5 % RE CREA
TOPICAL_CREAM | Freq: Two times a day (BID) | RECTAL | Status: DC
  Administered 2014-07-25 – 2014-07-27 (×5): via RECTAL
  Filled 2014-07-25: qty 28.35

## 2014-07-25 MED ORDER — OXYCODONE-ACETAMINOPHEN 5-325 MG PO TABS
1.0000 | ORAL_TABLET | Freq: Three times a day (TID) | ORAL | Status: DC | PRN
  Administered 2014-07-26 – 2014-07-27 (×4): 1 via ORAL
  Filled 2014-07-25 (×4): qty 1

## 2014-07-25 MED ORDER — OXYCODONE HCL 5 MG PO TABS
5.0000 mg | ORAL_TABLET | Freq: Three times a day (TID) | ORAL | Status: DC | PRN
  Administered 2014-07-25 – 2014-07-27 (×3): 5 mg via ORAL
  Filled 2014-07-25 (×3): qty 1

## 2014-07-25 MED ORDER — MORPHINE SULFATE 2 MG/ML IJ SOLN: 2.0000 mg | INTRAMUSCULAR | Status: DC | PRN

## 2014-07-25 MED ORDER — IOHEXOL 300 MG/ML  SOLN
100.0000 mL | Freq: Once | INTRAMUSCULAR | Status: AC | PRN
  Administered 2014-07-25: 100 mL via INTRAVENOUS

## 2014-07-25 MED ORDER — ONDANSETRON HCL 4 MG/2ML IJ SOLN
4.0000 mg | Freq: Four times a day (QID) | INTRAMUSCULAR | Status: DC | PRN
  Administered 2014-07-25 (×2): 4 mg via INTRAVENOUS
  Filled 2014-07-25 (×2): qty 2

## 2014-07-25 MED ORDER — IOHEXOL 300 MG/ML  SOLN
50.0000 mL | Freq: Once | INTRAMUSCULAR | Status: AC | PRN
Start: 2014-07-25 — End: 2014-07-25
  Administered 2014-07-25: 50 mL via ORAL

## 2014-07-25 MED ORDER — OXYCODONE-ACETAMINOPHEN 10-325 MG PO TABS: 1.0000 | ORAL_TABLET | Freq: Three times a day (TID) | ORAL | Status: DC | PRN

## 2014-07-25 MED ORDER — ACETAMINOPHEN 325 MG PO TABS
650.0000 mg | ORAL_TABLET | Freq: Four times a day (QID) | ORAL | Status: DC | PRN
  Administered 2014-07-26: 650 mg via ORAL
  Filled 2014-07-25 (×2): qty 2

## 2014-07-25 MED ORDER — SODIUM CHLORIDE 0.9 % IJ SOLN: 3.0000 mL | INTRAMUSCULAR | Status: DC | PRN

## 2014-07-25 NOTE — Consult Note (Signed)
Referring Provider: No ref. provider found Primary Care Physician:  Samuel Jester, DO Primary Gastroenterologist:  Dr.  Date of Admission: 07/25/14 Date of Consultation: 07/25/14  Reason for Consultation:  Rectal bleed  HPI:  49 year old female presented to the ER with rash/pruritis and upper abdominal pain which started approximately 10 pm on 07/24/14. In the ER had multiple episodes of emesis as well as bloody diarrhea x 2 episodes in the ER. Has not had any brbpr or melena leading up to this. Patient Has a history of constipation with a bowel movement every 3-4 days with sensation of incomplete emptying. Has had hemorrhoids s/p multiple banding banding. Las colonoscopy 03/01/13 for hematochezia with adequate prep, normal rectum except for anal canal/internal hemorrhoids. Colon mucosa appeared normal; internal hemorrhoids likely source of bleeding. Takes a daily low dose ASA and occasional ASA powders though not on a daily basis. In the ER vitals were stable, labs normal. CT A&P showed "Fluid in the large bowel can be seen with enteritis without bowel obstruction nor acute intra-abdominal/pelvic process."  Admitted with dx of likely enteritis with rectal bleeding. Having some rectal irritation, order for Anusol has been entered and is providing symptomatic relief per patient.  Past Medical History  Diagnosis Date  . Complication of anesthesia 1991 and 1998    childbirth --reaction to anesthesia was itching.   . Shortness of breath     quit smoking two years ago and still has some sob with exertion   . Chronic back pain   . DDD (degenerative disc disease)   . PONV (postoperative nausea and vomiting)   . Hemorrhoids     Past Surgical History  Procedure Laterality Date  . Adenoidectomy  1978  . Knee arthroscopy  2003  . Cesarean section  1991 1998    pt had itching with 1991 c-section.   . Lumbar laminectomy/decompression microdiscectomy  03/02/2012    Procedure: LUMBAR  LAMINECTOMY/DECOMPRESSION MICRODISCECTOMY 1 LEVEL;  Surgeon: Carmela Hurt, MD;  Location: [facility];  Service: Neurosurgery;  Laterality: Left;  LEFT Lumbar Five-Sacral One Laminectomy for Decompression   . Colonoscopy N/A 03/01/2013    Dr. Jena Gauss- normal rectum except for anal canal/internal hemorrhoidal tags. colonic mucosa appeared normal.    Prior to Admission medications   Medication Sig Start Date End Date Taking? Authorizing Provider  Aspirin-Acetaminophen-Caffeine (GOODYS EXTRA STRENGTH) [phone] MG PACK Take 1 packet by mouth every 6 (six) hours as needed (Pain).   Yes Historical Provider, MD  cholecalciferol (VITAMIN D) 1000 UNITS tablet Take 1,000 Units by mouth daily.   Yes Historical Provider, MD  desogestrel-ethinyl estradiol (KARIVA,AZURETTE,MIRCETTE) 0.15-0.02/0.01 MG (21/5) tablet Take 1 tablet by mouth daily.   Yes Historical Provider, MD  FOLIC ACID PO Take 1 tablet by mouth daily.   Yes Historical Provider, MD  oxyCODONE-acetaminophen (PERCOCET) 10-325 MG per tablet Take 1 tablet by mouth 3 (three) times daily as needed for pain. For hip pain.   Yes Historical Provider, MD  simvastatin (ZOCOR) 20 MG tablet Take 20 mg by mouth every evening.   Yes Historical Provider, MD  tiZANidine (ZANAFLEX) 4 MG tablet Take 4 mg by mouth at bedtime as needed. For muscle spasms.   Yes Historical Provider, MD  ferrous sulfate (IRON SUPPLEMENT) 325 (65 FE) MG tablet Take 325 mg by mouth daily with breakfast.    Historical Provider, MD  hydrocortisone (ANUSOL-HC) 2.5 % rectal cream Place rectally 2 (two) times daily. 02/14/13   Nira Retort, NP  ibuprofen (  ADVIL,MOTRIN) 800 MG tablet Take 800 mg by mouth every 8 (eight) hours as needed for pain.    Historical Provider, MD  LINZESS 290 MCG CAPS capsule TAKE 1 CAPSULE EVERY MORNING BEFORE BREAKFAST Patient not taking: Reported on 07/25/2014 04/10/14   Tiffany Kocher, PA-C  lubiprostone (AMITIZA) 24 MCG capsule Take 1 capsule (24 mcg total) by  mouth 2 (two) times daily with a meal. Patient not taking: Reported on 07/25/2014 03/07/13   Nira Retort, NP  polyethylene glycol-electrolytes (TRILYTE) 420 G solution Take 4,000 mLs by mouth as directed. 02/21/13   Corbin Ade, MD    Current Facility-Administered Medications  Medication Dose Route Frequency Provider Last Rate Last Dose  . 0.9 %  sodium chloride infusion  250 mL Intravenous PRN Standley Brooking, MD      . acetaminophen (TYLENOL) tablet 650 mg  650 mg Oral Q6H PRN Standley Brooking, MD       Or  . acetaminophen (TYLENOL) suppository 650 mg  650 mg Rectal Q6H PRN Standley Brooking, MD      . hydrocortisone (ANUSOL-HC) 2.5 % rectal cream   Rectal BID Standley Brooking, MD      . morphine 2 MG/ML injection 2 mg  2 mg Intravenous Q4H PRN Standley Brooking, MD      . ondansetron [facility]) tablet 4 mg  4 mg Oral Q6H PRN Standley Brooking, MD       Or  . ondansetron [facility]) injection 4 mg  4 mg Intravenous Q6H PRN Standley Brooking, MD   4 mg at 07/25/14 1309  . oxyCODONE-acetaminophen (PERCOCET/ROXICET) 5-325 MG per tablet 1 tablet  1 tablet Oral TID PRN Standley Brooking, MD       And  . oxyCODONE (Oxy IR/ROXICODONE) immediate release tablet 5 mg  5 mg Oral TID PRN Standley Brooking, MD   5 mg at 07/25/14 1314  . sodium chloride 0.9 % injection 3 mL  3 mL Intravenous Q12H Standley Brooking, MD   3 mL at 07/25/14 1100  . sodium chloride 0.9 % injection 3 mL  3 mL Intravenous PRN Standley Brooking, MD        Allergies as of 07/24/2014  . (No Known Allergies)    Family History  Problem Relation Age of Onset  . Colon cancer Neg Hx   . Hypertension Mother   . Cancer Father   . Hypertension Father     History   Social History  . Marital Status: Divorced    Spouse Name: N/A    Number of Children: N/A  . Years of Education: N/A   Occupational History  . Not on file.   Social History Main Topics  . Smoking status: Former Games developer  . Smokeless tobacco: Not on file  .  Alcohol Use: No  . Drug Use: No  . Sexual Activity: No   Other Topics Concern  . Not on file   Social History Narrative    Review of Systems: Gen: Denies fever, chills, loss of appetite, change in weight or weight loss CV: Denies chest pain, heart palpitations, syncope  Resp: Denies shortness of breath with rest. GI: See HPI. Denies dysphagia or odynophagia. Denies vomiting blood and fecal incontinence.  Derm: Denies rash, itching, dry skin Psych: Denies depression, anxiety,confusion, or memory loss Heme: Denies bruising, bleeding other then HPI, and enlarged lymph nodes.  Physical Exam: Vital signs in last 24 hours: Temp:  [97.7 F (36.5  C)-99.1 F (37.3 C)] 98.9 F (37.2 C) (02/05 1429) Pulse Rate:  [88-103] 103 (02/05 1429) Resp:  [16-20] 20 (02/05 1429) BP: (112-159)/(75-101) 154/84 mmHg (02/05 1429) SpO2:  [97 %-100 %] 97 % (02/05 1429) Weight:  [220 lb (99.791 kg)] 220 lb (99.791 kg) (02/05 0014) Last BM Date: 07/25/14 General:   Alert,  Well-developed, well-nourished, pleasant and cooperative in NAD. Does not appear acutely ill or toxic. Head:  Normocephalic and atraumatic. Eyes:  Sclera clear, no icterus.  Conjunctiva pink. Ears:  Normal auditory acuity. Neck:  Supple; no overt masses or thyromegaly. Lungs:  Clear throughout to auscultation.   No wheezes, crackles, or rhonchi. No acute distress. Heart:  Regular rate and rhythm; no murmurs, clicks, rubs,  or gallops. Abdomen:  Soft and nondistended. Mild to moderate TTP left abdomen. No masses, hepatosplenomegaly or hernias noted. Normal bowel sounds, without guarding, and without rebound.   Rectal:  Deferred.   Msk:  Symmetrical without gross deformities. Normal posture. Pulses:  Normal pulses noted. Extremities:  Without clubbing or edema. Neurologic:  Alert and  oriented x4;  grossly normal neurologically. Skin:  Intact without significant lesions or rashes.. Psych:  Alert and cooperative. Normal mood and  affect.  Intake/Output from previous day:   Intake/Output this shift:    Lab Results:  Recent Labs  07/25/14 0121 07/25/14 1352  WBC 10.4 10.7*  HGB 14.5 12.1  HCT 43.1 36.6  PLT 234 244   BMET  Recent Labs  07/25/14 0121  NA 140  K 3.7  CL 113*  CO2 22  GLUCOSE 128*  BUN 12  CREATININE 0.95  CALCIUM 9.3   LFT  Recent Labs  07/25/14 0121  PROT 7.3  ALBUMIN 3.4*  AST 27  ALT 16  ALKPHOS 63  BILITOT 0.5   PT/INR No results for input(s): LABPROT, INR in the last 72 hours. Hepatitis Panel No results for input(s): HEPBSAG, HCVAB, HEPAIGM, HEPBIGM in the last 72 hours. C-Diff No results for input(s): CDIFFTOX in the last 72 hours.  Studies/Results: Ct Abdomen Pelvis W Contrast  07/25/2014   CLINICAL DATA:  Burning abdominal pain beginning at 2200 hours yesterday. Subsequent hives, vomiting, symptoms are now worse. Rectal bleeding. History of colonoscopy September 2014.  EXAM: CT ABDOMEN AND PELVIS WITH CONTRAST  TECHNIQUE: Multidetector CT imaging of the abdomen and pelvis was performed using the standard protocol following bolus administration of intravenous contrast.  CONTRAST:  50mL OMNIPAQUE IOHEXOL 300 MG/ML SOLN, OMNIPAQUE IOHEXOL 300 MG/ML SOLN  COMPARISON:  None.  FINDINGS: LUNG BASES: Included view of the lung bases are clear. Visualized heart and pericardium are unremarkable.  SOLID ORGANS: The liver, spleen, gallbladder, pancreas and adrenal glands are unremarkable.  GASTROINTESTINAL TRACT: The stomach, small and large bowel are normal in course and caliber without inflammatory changes. Fluid filled large bowel. The appendix is not discretely identified, however there are no inflammatory changes in the right lower quadrant.  KIDNEYS/ URINARY TRACT: Kidneys are orthotopic, demonstrating symmetric enhancement. 4 mm RIGHT lower pole, 6 mm RIGHT interpolar, 5 mm and 3 mm RIGHT interpolar, 3 mm LEFT upper pole, punctate LEFT inter in lower pole  nephrolithiasis. No hydronephrosis or solid renal masses. The unopacified ureters are normal in course and caliber. Delayed imaging through the kidneys demonstrates symmetric prompt contrast excretion within the proximal urinary collecting system. Urinary bladder is partially distended and unremarkable.  PERITONEUM/RETROPERITONEUM: Aortoiliac vessels are normal in course and caliber. LEFT inferior vena cava. No lymphadenopathy by CT size criteria.  Internal reproductive organs are unremarkable. No intraperitoneal free fluid nor free air.  SOFT TISSUE/OSSEOUS STRUCTURES: Non-suspicious. Severe RIGHT sacroiliac osteoarthrosis. Moderate to severe lower lumbar facet arthropathy resulting in severe L5-S1 neural foraminal narrowing. Scattered Schmorl's nodes.  IMPRESSION: Fluid in the large bowel can be seen with enteritis without bowel obstruction nor acute intra-abdominal/pelvic process.  Bilateral nonobstructing nephrolithiasis measuring up to 6 mm on the RIGHT.   Electronically Signed   By: Awilda Metroourtnay  Bloomer   On: 07/25/2014 04:28    Impression: 49 year old female with a history of persistent constipation admitted with likely enteritis. Has had bloody diarrhea x 2 in the ER last night/early morning. Has had an additional 1-2 episodes of brbpr today. Serial CBCs being checked, H/H decreased from 14.5/43.1 to 12.1/36.6 in 12 hours, last checked around 2 pm today. Source of bleeding likely from hemorrhoids with history of constipation and now diarrhea causing irritation. Last colonoscopy end of 2014 with good prep showed no colon mucosa abnormalities which is reassuring. Patient does not appear acutely ill or toxic, no signs/symptoms of symptomatic anemia. H/H still within normal limits despite decline since last night.   Plan: 1. Supportive measures 2. Continue to monitor bowel movements 3. Continue Anusol rectal cream for symptom treatment 4. If continued bleeding or significant decline in H/H can consider  flex sig versus surgical consult for hemorrhoidectomy   Wynne DustEric Gyan Cambre, AGNP-C Adult & Gerontological Nurse Practitioner [facility]    LOS: 1 day     07/25/2014, 2:54 PM

## 2014-07-25 NOTE — H&P (Signed)
History and Physical  Sandra Carroll JYN:[id number] DOB: December 24, 1965 DOA: 07/24/2014  Referring physician: Dr. Lars Mage in ED PCP: Samuel Jester, DO   Chief Complaint: Abdominal pain  HPI:  48yow presented to the emergency department with acute onset of rash, pruritus, burning upper abdominal pain in the evening 2/4. This persisted throughout the night. She was seen in the emergency department and thereafter developed multiple episodes of vomiting as well as diarrhea with 2 episodes of bloody bowel movements. Initial evaluation suggested enteritis and patient was referred for admission.  She reports she had a similar episode several years ago which was not as severe but again with pruritic rash. She has had no changes to her medications lately. She has had no recent bleeding. She does have a history of hemorrhoids which have been banded several times. She takes a daily low-dose aspirin and occasional Goody powders, not daily. No specific aggravating or alleviating factors in regard to abdominal pain except seems to be exacerbated prior to bowel movement. She has had no GI sick contacts although her son has had upper respiratory tract infection.  In the emergency department afebrile, vital signs stable. No hypoxia. Complete metabolic panel unremarkable, lipase normal. CBC with normal hemoglobin and CBC and platelets. Urinalysis was negative. See differential is CL PCR was negative.  Review of Systems:  Negative for fever, visual changes, sore throat, new muscle aches, chest pain, SOB, dysuria  Past Medical History  Diagnosis Date  . Complication of anesthesia 1991 and 1998    childbirth --reaction to anesthesia was itching.   . Shortness of breath     quit smoking two years ago and still has some sob with exertion   . Chronic back pain   . DDD (degenerative disc disease)   . PONV (postoperative nausea and vomiting)   . Hemorrhoids     Past Surgical History  Procedure Laterality Date  .  Adenoidectomy  1978  . Knee arthroscopy  2003  . Cesarean section  1991 1998    pt had itching with 1991 c-section.   . Lumbar laminectomy/decompression microdiscectomy  03/02/2012    Procedure: LUMBAR LAMINECTOMY/DECOMPRESSION MICRODISCECTOMY 1 LEVEL;  Surgeon: Carmela Hurt, MD;  Location: [facility];  Service: Neurosurgery;  Laterality: Left;  LEFT Lumbar Five-Sacral One Laminectomy for Decompression   . Colonoscopy N/A 03/01/2013    Dr. Jena Gauss- normal rectum except for anal canal/internal hemorrhoidal tags. colonic mucosa appeared normal.    Social History:  reports that she has quit smoking. She does not have any smokeless tobacco history on file. She reports that she does not drink alcohol or use illicit drugs.  No Known Allergies  Family History  Problem Relation Age of Onset  . Colon cancer Neg Hx   no particular medical problems, reports family generally healthy   Prior to Admission medications   Medication Sig Start Date End Date Taking? Authorizing Provider  aspirin EC 325 MG tablet Take 325 mg by mouth daily.   Yes Historical Provider, MD  Aspirin-Acetaminophen-Caffeine (GOODYS EXTRA STRENGTH) [phone] MG PACK Take 1 packet by mouth every 6 (six) hours as needed (Pain).   Yes Historical Provider, MD  folic acid (FOLVITE) 800 MCG tablet Take 800 mcg by mouth daily.   Yes Historical Provider, MD  oxyCODONE-acetaminophen (PERCOCET) 10-325 MG per tablet Take 1 tablet by mouth 3 (three) times daily as needed for pain. For hip pain.   Yes Historical Provider, MD  simvastatin (ZOCOR) 20 MG tablet Take 20 mg  by mouth every evening.   Yes Historical Provider, MD  tiZANidine (ZANAFLEX) 4 MG tablet Take 4 mg by mouth at bedtime as needed. For muscle spasms.   Yes Historical Provider, MD  ferrous sulfate (IRON SUPPLEMENT) 325 (65 FE) MG tablet Take 325 mg by mouth daily with breakfast.    Historical Provider, MD  hydrocortisone (ANUSOL-HC) 2.5 % rectal cream Place rectally 2 (two)  times daily. 02/14/13   Nira RetortAnna W Sams, NP  ibuprofen (ADVIL,MOTRIN) 800 MG tablet Take 800 mg by mouth every 8 (eight) hours as needed for pain.    Historical Provider, MD  LINZESS 290 MCG CAPS capsule TAKE 1 CAPSULE EVERY MORNING BEFORE BREAKFAST 04/10/14   Tiffany KocherLeslie S Lewis, PA-C  lubiprostone (AMITIZA) 24 MCG capsule Take 1 capsule (24 mcg total) by mouth 2 (two) times daily with a meal. 03/07/13   Nira RetortAnna W Sams, NP  polyethylene glycol-electrolytes (TRILYTE) 420 G solution Take 4,000 mLs by mouth as directed. 02/21/13   Corbin Adeobert M Rourk, MD   Physical Exam: Filed Vitals:   07/25/14 0014 07/25/14 0619  BP: 112/75 150/93  Pulse: 99 88  Temp: 97.7 F (36.5 C) 98.2 F (36.8 C)  TempSrc: Oral Oral  Resp: 20 16  Height: 5\' 2"  (1.575 m)   Weight: 99.791 kg (220 lb)   SpO2: 99% 98%   General: examined in ED. Appears calm and mildly uncomfortable but not ill Eyes: PERRL, normal lids, irises  ENT: grossly normal hearing, lips & tongue Neck: no LAD, masses or thyromegaly Cardiovascular: RRR, no m/r/g. No LE edema. Respiratory: CTA bilaterally, no w/r/r. Normal respiratory effort. Speaks in full sentences. Abdomen: soft, obese, moderate upper abdominal pain; skin appears unremarkable Skin: no rash or induration seen  Musculoskeletal: grossly normal tone BUE/BLE Psychiatric: grossly normal mood and affect, speech fluent and appropriate Neurologic: grossly non-focal.  Wt Readings from Last 3 Encounters:  07/25/14 99.791 kg (220 lb)  06/25/13 109.68 kg (241 lb 12.8 oz)  05/21/13 111.766 kg (246 lb 6.4 oz)    Labs on Admission:  Basic Metabolic Panel:  Recent Labs Lab 07/25/14 0121  NA 140  K 3.7  CL 113*  CO2 22  GLUCOSE 128*  BUN 12  CREATININE 0.95  CALCIUM 9.3    Liver Function Tests:  Recent Labs Lab 07/25/14 0121  AST 27  ALT 16  ALKPHOS 63  BILITOT 0.5  PROT 7.3  ALBUMIN 3.4*    Recent Labs Lab 07/25/14 0121  LIPASE 26   CBC:  Recent Labs Lab 07/25/14 0121    WBC 10.4  NEUTROABS 8.8*  HGB 14.5  HCT 43.1  MCV 94.7  PLT 234    Radiological Exams on Admission: Ct Abdomen Pelvis W Contrast  07/25/2014   CLINICAL DATA:  Burning abdominal pain beginning at 2200 hours yesterday. Subsequent hives, vomiting, symptoms are now worse. Rectal bleeding. History of colonoscopy September 2014.  EXAM: CT ABDOMEN AND PELVIS WITH CONTRAST  TECHNIQUE: Multidetector CT imaging of the abdomen and pelvis was performed using the standard protocol following bolus administration of intravenous contrast.  CONTRAST:  50mL OMNIPAQUE IOHEXOL 300 MG/ML SOLN, 100mL OMNIPAQUE IOHEXOL 300 MG/ML SOLN  COMPARISON:  None.  FINDINGS: LUNG BASES: Included view of the lung bases are clear. Visualized heart and pericardium are unremarkable.  SOLID ORGANS: The liver, spleen, gallbladder, pancreas and adrenal glands are unremarkable.  GASTROINTESTINAL TRACT: The stomach, small and large bowel are normal in course and caliber without inflammatory changes. Fluid filled large bowel. The appendix  is not discretely identified, however there are no inflammatory changes in the right lower quadrant.  KIDNEYS/ URINARY TRACT: Kidneys are orthotopic, demonstrating symmetric enhancement. 4 mm RIGHT lower pole, 6 mm RIGHT interpolar, 5 mm and 3 mm RIGHT interpolar, 3 mm LEFT upper pole, punctate LEFT inter in lower pole nephrolithiasis. No hydronephrosis or solid renal masses. The unopacified ureters are normal in course and caliber. Delayed imaging through the kidneys demonstrates symmetric prompt contrast excretion within the proximal urinary collecting system. Urinary bladder is partially distended and unremarkable.  PERITONEUM/RETROPERITONEUM: Aortoiliac vessels are normal in course and caliber. LEFT inferior vena cava. No lymphadenopathy by CT size criteria. Internal reproductive organs are unremarkable. No intraperitoneal free fluid nor free air.  SOFT TISSUE/OSSEOUS STRUCTURES: Non-suspicious. Severe RIGHT  sacroiliac osteoarthrosis. Moderate to severe lower lumbar facet arthropathy resulting in severe L5-S1 neural foraminal narrowing. Scattered Schmorl's nodes.  IMPRESSION: Fluid in the large bowel can be seen with enteritis without bowel obstruction nor acute intra-abdominal/pelvic process.  Bilateral nonobstructing nephrolithiasis measuring up to 6 mm on the RIGHT.   Electronically Signed   By: Awilda Metro   On: 07/25/2014 04:28    Principal Problem:   Rectal bleeding Active Problems:   Constipation   Internal hemorrhoids   Upper abdominal pain   Nausea and vomiting   Rash   Assessment/Plan 1. Upper abdominal pain, nausea, vomiting, diarrhea; CT scan suggests enteritis without evidence of obstruction or acute intra-abdominal/pelvic process. LFTs unremarkable and lipase normal. Suspect viral enteritis. 2. Hematochezia, rectal bleeding. Likely hemorrhoidal, she underwent banding of hemorrhoids October 2014, December 2014, January 2015 may be complicated by NSAID therapy. On aspirin, and takes periodic Goody powders. 3. Rash/hives. Appears resolved at this point. 4. Chronic constipation, acute diarrhea. Hold bowel regimen. 5. Chronic low back pain. Takes Percocet 1x daily.   Appears clinically stable at this time. Admit for supportive care, serial hemoglobin, GI consultation. Follow abdominal exam and clinical progress.  Code Status: full code DVT prophylaxis:SCDs Family Communication: none present Disposition Plan/Anticipated LOS: admit, 2 days  Time spent: 60 minutes  Brendia Sacks, MD  [facility] Pager [phone] 07/25/2014, 8:40 AM

## 2014-07-25 NOTE — ED Provider Notes (Signed)
CSN: [id number]     Arrival date & time 07/24/14  2351 History  This chart was scribed for Ward Givens, MD by Tonye Royalty, ED Scribe. This patient was seen in room APA05/APA05 and the patient's care was started at 12:52 AM.    Chief Complaint  Patient presents with  . Abdominal Pain   The history is provided by the patient and a relative. No language interpreter was used.    HPI Comments: Sandra Carroll is a 49 y.o. female who presents to the Emergency Department complaining of burning upper abdominal pain with onset at 2200 tonight. She reports associated itching hives affecting her diffusely including the palms of her hands, and vomiting. Daughter notes she had welts to her skin and states her face was pale. She states she had 1 similar episode in July. She states that symptoms began with hives last time and only mild burning to her abdomen; she states hives resolved after 2 hours that time. She states symptoms are worse today. She states itching has resolved. She states she lives alone. She notes that she only has bowel movements every 3-4 days but does eat every day. She reports bowel movements en route and here after not having had a bowel movement for 4 days.. She states she does not believe her symptoms are due to what she eats. She states her last meal was meatloaf at lunch time today.   PCP Dr Charm Barges  Past Medical History  Diagnosis Date  . Complication of anesthesia 1991 and 1998    childbirth --reaction to anesthesia was itching.   . Shortness of breath     quit smoking two years ago and still has some sob with exertion   . Chronic back pain   . DDD (degenerative disc disease)   . PONV (postoperative nausea and vomiting)   . Hemorrhoids    Past Surgical History  Procedure Laterality Date  . Adenoidectomy  1978  . Knee arthroscopy  2003  . Cesarean section  1991 1998    pt had itching with 1991 c-section.   . Lumbar laminectomy/decompression microdiscectomy  03/02/2012     Procedure: LUMBAR LAMINECTOMY/DECOMPRESSION MICRODISCECTOMY 1 LEVEL;  Surgeon: Carmela Hurt, MD;  Location: [facility];  Service: Neurosurgery;  Laterality: Left;  LEFT Lumbar Five-Sacral One Laminectomy for Decompression   . Colonoscopy N/A 03/01/2013    Dr. Jena Gauss- normal rectum except for anal canal/internal hemorrhoidal tags. colonic mucosa appeared normal.   Family History  Problem Relation Age of Onset  . Colon cancer Neg Hx    History  Substance Use Topics  . Smoking status: Former Games developer  . Smokeless tobacco: Not on file  . Alcohol Use: No   Lives at home Lives alone  OB History    No data available     Review of Systems  Gastrointestinal: Positive for nausea, vomiting, abdominal pain and constipation.  Skin: Positive for rash.  All other systems reviewed and are negative.   Allergies  Review of patient's allergies indicates no known allergies.  Home Medications   Prior to Admission medications   Medication Sig Start Date End Date Taking? Authorizing Provider  aspirin EC 325 MG tablet Take 325 mg by mouth daily.   Yes Historical Provider, MD  Aspirin-Acetaminophen-Caffeine (GOODYS EXTRA STRENGTH) [phone] MG PACK Take 1 packet by mouth every 6 (six) hours as needed (Pain).   Yes Historical Provider, MD  folic acid (FOLVITE) 800 MCG tablet Take 800 mcg by mouth daily.  Yes Historical Provider, MD  oxyCODONE-acetaminophen (PERCOCET) 10-325 MG per tablet Take 1 tablet by mouth 3 (three) times daily as needed for pain. For hip pain.   Yes Historical Provider, MD  simvastatin (ZOCOR) 20 MG tablet Take 20 mg by mouth every evening.   Yes Historical Provider, MD  tiZANidine (ZANAFLEX) 4 MG tablet Take 4 mg by mouth at bedtime as needed. For muscle spasms.   Yes Historical Provider, MD  ferrous sulfate (IRON SUPPLEMENT) 325 (65 FE) MG tablet Take 325 mg by mouth daily with breakfast.    Historical Provider, MD  hydrocortisone (ANUSOL-HC) 2.5 % rectal cream Place rectally  2 (two) times daily. 02/14/13   Nira Retort, NP  ibuprofen (ADVIL,MOTRIN) 800 MG tablet Take 800 mg by mouth every 8 (eight) hours as needed for pain.    Historical Provider, MD  LINZESS 290 MCG CAPS capsule TAKE 1 CAPSULE EVERY MORNING BEFORE BREAKFAST 04/10/14   Tiffany Kocher, PA-C  lubiprostone (AMITIZA) 24 MCG capsule Take 1 capsule (24 mcg total) by mouth 2 (two) times daily with a meal. 03/07/13   Nira Retort, NP  polyethylene glycol-electrolytes (TRILYTE) 420 G solution Take 4,000 mLs by mouth as directed. 02/21/13   Corbin Ade, MD   BP 112/75 mmHg  Pulse 99  Temp(Src) 97.7 F (36.5 C) (Oral)  Resp 20  Ht  (1.575 m)  Wt 220 lb (99.791 kg)  BMI 40.23 kg/m2  SpO2 99%  LMP 07/16/2014  Vital signs normal   Physical Exam  Constitutional: She is oriented to person, place, and time. She appears well-developed and well-nourished.  Non-toxic appearance. She does not appear ill. No distress.  HENT:  Head: Normocephalic and atraumatic.  Right Ear: External ear normal.  Left Ear: External ear normal.  Nose: Nose normal. No mucosal edema or rhinorrhea.  Mouth/Throat: Oropharynx is clear and moist and mucous membranes are normal. No dental abscesses or uvula swelling.  Eyes: Conjunctivae and EOM are normal. Pupils are equal, round, and reactive to light.  Neck: Normal range of motion and full passive range of motion without pain. Neck supple.  Cardiovascular: Normal rate, regular rhythm and normal heart sounds.  Exam reveals no gallop and no friction rub.   No murmur heard. Pulmonary/Chest: Effort normal and breath sounds normal. No respiratory distress. She has no wheezes. She has no rhonchi. She has no rales. She exhibits no tenderness and no crepitus.  Abdominal: Soft. Normal appearance and bowel sounds are normal. She exhibits no distension. There is no tenderness. There is no rebound and no guarding.  diffuse upper abdominal tenderness  Musculoskeletal: Normal range of motion.  She exhibits no edema or tenderness.  Moves all extremities well.   Neurological: She is alert and oriented to person, place, and time. She has normal strength. No cranial nerve deficit.  Skin: Skin is warm, dry and intact. No rash noted. No erythema. No pallor.  Diffuse redness of skin without urticaria Patient states some of it is sunburn  Psychiatric: She has a normal mood and affect. Her speech is normal and behavior is normal. Her mood appears not anxious.  Nursing note and vitals reviewed.   ED Course  Procedures (including critical care time)  Medications  famotidine (PEPCID) IVPB 20 mg (0 mg Intravenous Stopped 07/25/14 0150)  fentaNYL (SUBLIMAZE) injection 25 mcg (25 mcg Intravenous Given 07/25/14 0120)  ondansetron (ZOFRAN) injection 4 mg (4 mg Intravenous Given 07/25/14 0120)  ondansetron (ZOFRAN) injection 4 mg (4 mg  Intravenous Given 07/25/14 0225)  iohexol (OMNIPAQUE) 300 MG/ML solution 50 mL (50 mLs Oral Contrast Given 07/25/14 0315)  iohexol (OMNIPAQUE) 300 MG/ML solution 100 mL (100 mLs Intravenous Contrast Given 07/25/14 0351)     DIAGNOSTIC STUDIES: Oxygen Saturation is 99% on room air, normal by my interpretation.    COORDINATION OF CARE: 12:59 AM Discussed treatment plan with patient at beside, the patient agrees with the plan and has no further questions at this time. Patient was given IV nausea and pain medicine. She was also given IV Pepcid.  Nurse reports patient had a bowel movement on arrival which was like a normal bowel movement, she then had a bowel movement that seemed to be looser and had some blood mixed in it. She then had a third episode that was mainly blood. Hemoccult was done and was very positive.  CT scan was done to further evaluate her abdominal pain.  06:12 Dr Onalee Huaavid admit to tele   Labs Review Results for orders placed or performed during the hospital encounter of 07/24/14  Clostridium Difficile by PCR  Result Value Ref Range   C difficile by pcr  NEGATIVE NEGATIVE  Comprehensive metabolic panel  Result Value Ref Range   Sodium 140 135 - 145 mmol/L   Potassium 3.7 3.5 - 5.1 mmol/L   Chloride 113 (H) 96 - 112 mmol/L   CO2 22 19 - 32 mmol/L   Glucose, Bld 128 (H) 70 - 99 mg/dL   BUN 12 6 - 23 mg/dL   Creatinine, Ser 5.620.95 0.50 - 1.10 mg/dL   Calcium 9.3 8.4 - 13.010.5 mg/dL   Total Protein 7.3 6.0 - 8.3 g/dL   Albumin 3.4 (L) 3.5 - 5.2 g/dL   AST 27 0 - 37 U/L   ALT 16 0 - 35 U/L   Alkaline Phosphatase 63 39 - 117 U/L   Total Bilirubin 0.5 0.3 - 1.2 mg/dL   GFR calc non Af Amer 71 (L) >90 mL/min   GFR calc Af Amer 82 (L) >90 mL/min   Anion gap 5 5 - 15  Lipase, blood  Result Value Ref Range   Lipase 26 11 - 59 U/L  CBC with Differential  Result Value Ref Range   WBC 10.4 4.0 - 10.5 K/uL   RBC 4.55 3.87 - 5.11 MIL/uL   Hemoglobin 14.5 12.0 - 15.0 g/dL   HCT 86.543.1 78.436.0 - 69.646.0 %   MCV 94.7 78.0 - 100.0 fL   MCH 31.9 26.0 - 34.0 pg   MCHC 33.6 30.0 - 36.0 g/dL   RDW 29.513.0 28.411.5 - 13.215.5 %   Platelets 234 150 - 400 K/uL   Neutrophils Relative % 84 (H) 43 - 77 %   Neutro Abs 8.8 (H) 1.7 - 7.7 K/uL   Lymphocytes Relative 13 12 - 46 %   Lymphs Abs 1.3 0.7 - 4.0 K/uL   Monocytes Relative 3 3 - 12 %   Monocytes Absolute 0.4 0.1 - 1.0 K/uL   Eosinophils Relative 0 0 - 5 %   Eosinophils Absolute 0.0 0.0 - 0.7 K/uL   Basophils Relative 0 0 - 1 %   Basophils Absolute 0.0 0.0 - 0.1 K/uL  Urinalysis, Routine w reflex microscopic  Result Value Ref Range   Color, Urine AMBER (A) YELLOW   APPearance CLEAR CLEAR   Specific Gravity, Urine >1.030 (H) 1.005 - 1.030   pH 6.0 5.0 - 8.0   Glucose, UA NEGATIVE NEGATIVE mg/dL   Hgb urine dipstick SMALL (  A) NEGATIVE   Bilirubin Urine SMALL (A) NEGATIVE   Ketones, ur TRACE (A) NEGATIVE mg/dL   Protein, ur 30 (A) NEGATIVE mg/dL   Urobilinogen, UA 0.2 0.0 - 1.0 mg/dL   Nitrite NEGATIVE NEGATIVE   Leukocytes, UA NEGATIVE NEGATIVE  Urine microscopic-add on  Result Value Ref Range   Squamous  Epithelial / LPF FEW (A) RARE   RBC / HPF 3-6 <3 RBC/hpf   Bacteria, UA MANY (A) RARE  Sample to Blood Bank  Result Value Ref Range   Blood Bank Specimen SAMPLE AVAILABLE FOR TESTING    Sample Expiration 07/28/2014      Laboratory interpretation all normal except except concentrated urine   Imaging Review Ct Abdomen Pelvis W Contrast  07/25/2014   CLINICAL DATA:  Burning abdominal pain beginning at 2200 hours yesterday. Subsequent hives, vomiting, symptoms are now worse. Rectal bleeding. History of colonoscopy September 2014.  EXAM: CT ABDOMEN AND PELVIS WITH CONTRAST  TECHNIQUE: Multidetector CT imaging of the abdomen and pelvis was performed using the standard protocol following bolus administration of intravenous contrast.  CONTRAST:  50mL OMNIPAQUE IOHEXOL 300 MG/ML SOLN, OMNIPAQUE IOHEXOL 300 MG/ML SOLN  COMPARISON:  None.  FINDINGS: LUNG BASES: Included view of the lung bases are clear. Visualized heart and pericardium are unremarkable.  SOLID ORGANS: The liver, spleen, gallbladder, pancreas and adrenal glands are unremarkable.  GASTROINTESTINAL TRACT: The stomach, small and large bowel are normal in course and caliber without inflammatory changes. Fluid filled large bowel. The appendix is not discretely identified, however there are no inflammatory changes in the right lower quadrant.  KIDNEYS/ URINARY TRACT: Kidneys are orthotopic, demonstrating symmetric enhancement. 4 mm RIGHT lower pole, 6 mm RIGHT interpolar, 5 mm and 3 mm RIGHT interpolar, 3 mm LEFT upper pole, punctate LEFT inter in lower pole nephrolithiasis. No hydronephrosis or solid renal masses. The unopacified ureters are normal in course and caliber. Delayed imaging through the kidneys demonstrates symmetric prompt contrast excretion within the proximal urinary collecting system. Urinary bladder is partially distended and unremarkable.  PERITONEUM/RETROPERITONEUM: Aortoiliac vessels are normal in course and caliber. LEFT  inferior vena cava. No lymphadenopathy by CT size criteria. Internal reproductive organs are unremarkable. No intraperitoneal free fluid nor free air.  SOFT TISSUE/OSSEOUS STRUCTURES: Non-suspicious. Severe RIGHT sacroiliac osteoarthrosis. Moderate to severe lower lumbar facet arthropathy resulting in severe L5-S1 neural foraminal narrowing. Scattered Schmorl's nodes.  IMPRESSION: Fluid in the large bowel can be seen with enteritis without bowel obstruction nor acute intra-abdominal/pelvic process.  Bilateral nonobstructing nephrolithiasis measuring up to 6 mm on the RIGHT.   Electronically Signed   By: Awilda Metro   On: 07/25/2014 04:28     EKG Interpretation None      MDM   Final diagnoses:  Upper abdominal pain  Rectal bleeding  Urticaria    Plan admission   Devoria Albe, MD, FACEP   I personally performed the services described in this documentation, which was scribed in my presence. The recorded information has been reviewed and considered.  Devoria Albe, MD, Armando Gang   Ward Givens, MD 07/25/14 [phone]

## 2014-07-25 NOTE — ED Notes (Signed)
Pt actively vomiting & had 2 loose stools.

## 2014-07-25 NOTE — ED Notes (Signed)
POC occult blood positive. EDP notified.

## 2014-07-25 NOTE — ED Notes (Signed)
MD at bedside. 

## 2014-07-26 DIAGNOSIS — K625 Hemorrhage of anus and rectum: Secondary | ICD-10-CM

## 2014-07-26 DIAGNOSIS — K648 Other hemorrhoids: Secondary | ICD-10-CM

## 2014-07-26 DIAGNOSIS — R1010 Upper abdominal pain, unspecified: Secondary | ICD-10-CM

## 2014-07-26 LAB — BASIC METABOLIC PANEL
Anion gap: 8 (ref 5–15)
BUN: 5 mg/dL — ABNORMAL LOW (ref 6–23)
CHLORIDE: 109 mmol/L (ref 96–112)
CO2: 23 mmol/L (ref 19–32)
CREATININE: 0.66 mg/dL (ref 0.50–1.10)
Calcium: 9 mg/dL (ref 8.4–10.5)
Glucose, Bld: 110 mg/dL — ABNORMAL HIGH (ref 70–99)
POTASSIUM: 3.4 mmol/L — AB (ref 3.5–5.1)
SODIUM: 140 mmol/L (ref 135–145)

## 2014-07-26 LAB — CBC
HCT: 35.9 % — ABNORMAL LOW (ref 36.0–46.0)
HEMOGLOBIN: 11.8 g/dL — AB (ref 12.0–15.0)
MCH: 31 pg (ref 26.0–34.0)
MCHC: 32.9 g/dL (ref 30.0–36.0)
MCV: 94.2 fL (ref 78.0–100.0)
PLATELETS: 234 10*3/uL (ref 150–400)
RBC: 3.81 MIL/uL — AB (ref 3.87–5.11)
RDW: 13.4 % (ref 11.5–15.5)
WBC: 12.5 10*3/uL — ABNORMAL HIGH (ref 4.0–10.5)

## 2014-07-26 NOTE — Progress Notes (Signed)
  Subjective: Patient continues to complain of burning across her upper abdomen. She also complains of pain in left upper quadrant for abdomen. She is passing flatus but she hasn't had any stools since yesterday. She did pass small to moderate amount of bright red blood this morning. She does not have an appetite but she denies nausea and vomiting.     Objective: Blood pressure 125/90, pulse 107, temperature 98.4 F (36.9 C), temperature source Oral, resp. rate 18, height 5\' 2"  (1.575 m), weight 220 lb (99.791 kg), last menstrual period 07/16/2014, SpO2 97 %. Patient is alert and appears to be comfortable sitting in chair. Abdomen is full. Bowel sounds are hyperactive. On palpation abdomen is soft with mild tenderness across upper abdomen but is predominantly left upper quadrant. No guarding or rebound noted.  No LE edema or clubbing noted.  Labs/studies Results:   Recent Labs  07/25/14 1352 07/25/14 1753 07/26/14 0621  WBC 10.7* 11.5* 12.5*  HGB 12.1 12.0 11.8*  HCT 36.6 36.0 35.9*  PLT 244 238 234    BMET   Recent Labs  07/25/14 0121 07/26/14 0621  NA 140 140  K 3.7 3.4*  CL 113* 109  CO2 22 23  GLUCOSE 128* 110*  BUN 12 5*  CREATININE 0.95 0.66  CALCIUM 9.3 9.0    LFT   Recent Labs  07/25/14 0121  PROT 7.3  ALBUMIN 3.4*  AST 27  ALT 16  ALKPHOS 63  BILITOT 0.5   Assessment:  #1. Acute gastroenteritis. She is slowly improving. She remains afebrile. GI pathogen panel has been ordered but patient has not been able to provide stool sample. #2. Rectal bleeding felt to be secondary to hemorrhoids. Since she has tenderness localized to left upper quadrant she could also have ischemic colitis.H&H is stable. If rectal bleeding accelerates she may need endoscopic evaluation.  Recommendations;  Advance diet to full liquids. CCB in am.

## 2014-07-26 NOTE — Progress Notes (Signed)
  PROGRESS NOTE  Sandra Carroll ZOX:[id number] DOB: 06/30/1965 DOA: 07/24/2014 PCP: Samuel JesterBUTLER, [name], DO  Summary: 48yow presented to the emergency department with acute onset of rash, pruritus, burning upper abdominal pain in the evening 2/4. This persisted throughout the night. She was seen in the emergency department and thereafter developed multiple episodes of vomiting as well as diarrhea with 2 episodes of bloody bowel movements. Initial evaluation suggested enteritis and patient was referred for admission.  Assessment/Plan: 1. Upper abdominal pain, nausea, vomiting, diarrhea secondary to enteritis. CT without complicating features. LFTs and lipase unremarkable. Viral enteritis suspected. Slowly improving. Continue supportive care. 2. Rectal bleeding secondary to internal hemorrhoids. Hemoglobin is stable. May have been complicated by periodic Goody powders. Follow-up with GI as an outpatient. 3. Rash/hives. Etiology unclear, appears resolved. 4. Chronic low back pain.   Appears to be improving, hemoglobin is stable. Plan to advance diet  Possibly home 2/7  Code Status: full code DVT prophylaxis: SCDs Family Communication: none present Disposition Plan: home  Brendia Sacksaniel Kemiyah Tarazon, MD  [facility]  Pager [phone] If 7PM-7AM, please contact night-coverage at [email or web address], password [email or web address] 07/26/2014, 12:06 PM  LOS: 2 days   Consultants:  GI  Procedures:    Antibiotics:    HPI/Subjective: GI recommended supportive measures  Overall improving. Still has some left-sided abdominal pain but wants to advance her diet to solid food. Still having some bleeding but that seems to be improving. No vomiting.  Objective: Filed Vitals:   07/25/14 1048 07/25/14 1429 07/25/14 2053 07/26/14 0636  BP: 158/80 154/84 141/86 125/90  Pulse: 92 103 114 107  Temp: 98.6 F (37 C) 98.9 F (37.2 C) 99.5 F (37.5 C) 98.4 F (36.9 C)  TempSrc:  Oral Oral Oral  Resp: 16 20 18 18   Height:       Weight:      SpO2: 100% 97% 95% 97%   No intake or output data in the 24 hours ending 07/26/14 1206   Filed Weights   07/25/14 0014  Weight: 99.791 kg (220 lb)    Exam:     Afebrile, vital signs are stable, no hypoxia General:  Appears calm and comfortable Cardiovascular: RRR, no m/r/g. No LE edema. Respiratory: CTA bilaterally, no w/r/r. Normal respiratory effort. Abdomen: soft, mild left-sided pain Psychiatric: grossly normal mood and affect, speech fluent and appropriate  Data Reviewed:  Hemoglobin remained stable 11.8. Modest leukocytosis stable.  Potassium 3.4. Basic metabolic panel otherwise unremarkable.  Scheduled Meds: . hydrocortisone   Rectal BID  . sodium chloride  3 mL Intravenous Q12H   Continuous Infusions:   Principal Problem:   Rectal bleeding Active Problems:   Constipation   Internal hemorrhoids   Upper abdominal pain   Nausea and vomiting   Rash   Enteritis   Time spent 20 minutes

## 2014-07-27 LAB — CBC
HCT: 35.6 % — ABNORMAL LOW (ref 36.0–46.0)
HEMOGLOBIN: 11.5 g/dL — AB (ref 12.0–15.0)
MCH: 31.1 pg (ref 26.0–34.0)
MCHC: 32.3 g/dL (ref 30.0–36.0)
MCV: 96.2 fL (ref 78.0–100.0)
Platelets: 224 10*3/uL (ref 150–400)
RBC: 3.7 MIL/uL — AB (ref 3.87–5.11)
RDW: 13.4 % (ref 11.5–15.5)
WBC: 12.7 10*3/uL — ABNORMAL HIGH (ref 4.0–10.5)

## 2014-07-27 NOTE — Progress Notes (Signed)
Late Entry. IV removed and site within normal limits.AVS reviewed with patient, verbalized understanding of instructions, medications, and physician follow up patient transported by nurse to main entrance. Stable at the time of discharge.

## 2014-07-27 NOTE — Progress Notes (Signed)
  PROGRESS NOTE  Mindi Slickerammy W Aull WUJ:[id number] DOB: 07/01/1965 DOA: 07/24/2014 PCP: Samuel JesterBUTLER, [name], DO  Summary: 48yow presented to the emergency department with acute onset of rash, pruritus, burning upper abdominal pain in the evening 2/4. This persisted throughout the night. She was seen in the emergency department and thereafter developed multiple episodes of vomiting as well as diarrhea with 2 episodes of bloody bowel movements. Initial evaluation suggested enteritis and patient was referred for admission.  Assessment/Plan: 1. Upper abdominal pain, nausea, vomiting, diarrhea secondary to enteritis. CT without complicating features. Differential includes viral gastroenteritis versus ischemic colitis. LFTs and lipase unremarkable. Overall improved. 2. Rectal bleeding secondary to internal hemorrhoids. Seems to have resolved, no real bleeding since admission. Hemoglobin remains stable. May have been complicated by periodic Goody powders. Follow-up with GI as an outpatient. 3. Rash/hives. Etiology unclear, appears resolved. 4. Chronic low back pain.   Overall improved; still some pain, probably from bloating. Will f/u this afternoon; consider d/c later today if feeling better vs AM  Code Status: full code DVT prophylaxis: SCDs Family Communication: none present Disposition Plan: home  Brendia Sacksaniel Easter Schinke, MD  [facility]  Pager [phone] If 7PM-7AM, please contact night-coverage at [email or web address], password [email or web address] 07/27/2014, 10:54 AM  LOS: 3 days   Consultants:  GI  Procedures:    Antibiotics:    HPI/Subjective: Ate a great breakfast with no pain. With lunch has had a lot of bloating and gas but no n/v; one small BM.  Objective: Filed Vitals:   07/26/14 0636 07/26/14 1442 07/26/14 2055 07/27/14 0556  BP: 125/90 116/71 144/75 125/81  Pulse: 107 107 104 107  Temp: 98.4 F (36.9 C) 98.5 F (36.9 C) 98.8 F (37.1 C) 98.9 F (37.2 C)  TempSrc: Oral Oral Oral Oral  Resp:  18 18 20 18   Height:      Weight:      SpO2: 97% 97% 98% 96%    Intake/Output Summary (Last 24 hours) at 07/27/14 1054 Last data filed at 07/27/14 [id number]  Gross per 24 hour  Intake   1200 ml  Output      0 ml  Net   1200 ml     Filed Weights   07/25/14 0014  Weight: 99.791 kg (220 lb)    Exam:     Afebrile, vital signs are stable, no hypoxia General:  Appears comfortable, calm. Cardiovascular: Regular rate and rhythm, no murmur, rub or gallop.  Respiratory: Clear to auscultation bilaterally, no wheezes, rales or rhonchi. Normal respiratory effort. Abdomen: soft, nd, left-sided pain is mild, skin looks normal Skin: no rash Psychiatric: grossly normal mood and affect, speech fluent and appropriate  Data Reviewed:  Hemoglobin remained stable 11.5  Scheduled Meds: . hydrocortisone   Rectal BID  . sodium chloride  3 mL Intravenous Q12H   Continuous Infusions:   Principal Problem:   Rectal bleeding Active Problems:   Constipation   Internal hemorrhoids   Upper abdominal pain   Nausea and vomiting   Rash   Enteritis   Time spent 20 minutes

## 2014-07-27 NOTE — Discharge Summary (Signed)
Physician Discharge Summary  [name] NWG:[id number]:[id number] DOB: 07/05/1965 DOA: 07/24/2014  PCP: Samuel JesterBUTLER, [name], DO  Admit date: 07/24/2014 Discharge date: 07/27/2014  Recommendations for Outpatient Follow-up:  1. Resolution of enteritis, see discussion below 2. Follow-up rectal bleeding either hemorrhoidal or consider ischemic colitis.    Follow-up Information    Follow up with Carroll, [name], DO.   Why:  As needed   Contact information:   [address] [place] [address] [phone]       Follow up with Eula Listenobert Rourk, MD.   Specialty:  Gastroenterology   Why:  office will call you for appointment   Contact information:   [address] [place] [address] [phone]      Discharge Diagnoses:  1. Upper abdominal pain, nausea, vomiting, diarrhea 2. Suspected viral enteritis 3. Rectal bleeding, suspect internal hemorrhoids, consider ischemic colitis 4. Rash upper body, resolved  Discharge Condition: improved Disposition: home  Diet recommendation: regular diet  Filed Weights   07/25/14 0014  Weight: 99.791 kg (220 lb)    History of present illness:  48yow presented to the emergency department with acute onset of rash, pruritus, burning upper abdominal pain in the evening 2/4. This persisted throughout the night. She was seen in the emergency department and thereafter developed multiple episodes of vomiting as well as diarrhea with 2 episodes of bloody bowel movements. Initial evaluation suggested enteritis and patient was referred for admission.  Hospital Course:  Ms. Sandra Carroll was admitted for further evaluation, seen in consultation with gastroenterology. She had no further significant bleeding and her hemoglobin stabilized. She required no blood products. She has had no real bowel movement since admission. Vomiting and nausea have resolved as has pain. She is tolerating a diet. She does have some intermittent gas but this seems to be transient. She would like  to go home today. She will follow up with GI as an outpatient for consideration of hemorrhoidal banding or colonoscopy.  1. Upper abdominal pain, nausea, vomiting, diarrhea secondary to enteritis. CT without complicating features. Differential includes viral gastroenteritis versus ischemic colitis. LFTs and lipase unremarkable. Overall improved. 2. Rectal bleeding secondary to internal hemorrhoids. Seems to have resolved, no real bleeding since admission. Hemoglobin remains stable. May have been complicated by periodic Goody powders. Follow-up with GI as an outpatient. 3. Rash/hives. Etiology unclear, appears resolved. 4. Chronic low back pain.  Consultants:  GI  Procedures:  none  Antibiotics: none  Discharge Instructions  Discharge Instructions    Activity as tolerated - No restrictions    Complete by:  As directed      Diet general    Complete by:  As directed      Discharge instructions    Complete by:  As directed   Call your physician or seek immediate medical attention for increased pain, bleeding or worsening of condition.          Current Discharge Medication List    CONTINUE these medications which have NOT CHANGED   Details  cholecalciferol (VITAMIN D) 1000 UNITS tablet Take 1,000 Units by mouth daily.    desogestrel-ethinyl estradiol (KARIVA,AZURETTE,MIRCETTE) 0.15-0.02/0.01 MG (21/5) tablet Take 1 tablet by mouth daily.    FOLIC ACID PO Take 1 tablet by mouth daily.    oxyCODONE-acetaminophen (PERCOCET) 10-325 MG per tablet Take 1 tablet by mouth 3 (three) times daily as needed for pain. For hip pain.    simvastatin (ZOCOR) 20 MG tablet Take 20 mg by mouth every evening.    tiZANidine (  ZANAFLEX) 4 MG tablet Take 4 mg by mouth at bedtime as needed. For muscle spasms.    hydrocortisone (ANUSOL-HC) 2.5 % rectal cream Place rectally 2 (two) times daily. Qty: 30 g, Refills: 1    polyethylene glycol-electrolytes (TRILYTE) 420 G solution Take 4,000 mLs by mouth  as directed. Qty: 4000 mL, Refills: 0      STOP taking these medications     Aspirin-Acetaminophen-Caffeine (GOODYS EXTRA STRENGTH) 500-325-65 MG PACK      ferrous sulfate (IRON SUPPLEMENT) 325 (65 FE) MG tablet      ibuprofen (ADVIL,MOTRIN) 800 MG tablet      LINZESS 290 MCG CAPS capsule      lubiprostone (AMITIZA) 24 MCG capsule        No Known Allergies  The results of significant diagnostics from this hospitalization (including imaging, microbiology, ancillary and laboratory) are listed below for reference.    Significant Diagnostic Studies: Ct Abdomen Pelvis W Contrast  07/25/2014   CLINICAL DATA:  Burning abdominal pain beginning at 2200 hours yesterday. Subsequent hives, vomiting, symptoms are now worse. Rectal bleeding. History of colonoscopy September 2014.  EXAM: CT ABDOMEN AND PELVIS WITH CONTRAST  TECHNIQUE: Multidetector CT imaging of the abdomen and pelvis was performed using the standard protocol following bolus administration of intravenous contrast.  CONTRAST:  50mL OMNIPAQUE IOHEXOL 300 MG/ML SOLN, OMNIPAQUE IOHEXOL 300 MG/ML SOLN  COMPARISON:  None.  FINDINGS: LUNG BASES: Included view of the lung bases are clear. Visualized heart and pericardium are unremarkable.  SOLID ORGANS: The liver, spleen, gallbladder, pancreas and adrenal glands are unremarkable.  GASTROINTESTINAL TRACT: The stomach, small and large bowel are normal in course and caliber without inflammatory changes. Fluid filled large bowel. The appendix is not discretely identified, however there are no inflammatory changes in the right lower quadrant.  KIDNEYS/ URINARY TRACT: Kidneys are orthotopic, demonstrating symmetric enhancement. 4 mm RIGHT lower pole, 6 mm RIGHT interpolar, 5 mm and 3 mm RIGHT interpolar, 3 mm LEFT upper pole, punctate LEFT inter in lower pole nephrolithiasis. No hydronephrosis or solid renal masses. The unopacified ureters are normal in course and caliber. Delayed imaging through  the kidneys demonstrates symmetric prompt contrast excretion within the proximal urinary collecting system. Urinary bladder is partially distended and unremarkable.  PERITONEUM/RETROPERITONEUM: Aortoiliac vessels are normal in course and caliber. LEFT inferior vena cava. No lymphadenopathy by CT size criteria. Internal reproductive organs are unremarkable. No intraperitoneal free fluid nor free air.  SOFT TISSUE/OSSEOUS STRUCTURES: Non-suspicious. Severe RIGHT sacroiliac osteoarthrosis. Moderate to severe lower lumbar facet arthropathy resulting in severe L5-S1 neural foraminal narrowing. Scattered Schmorl's nodes.  IMPRESSION: Fluid in the large bowel can be seen with enteritis without bowel obstruction nor acute intra-abdominal/pelvic process.  Bilateral nonobstructing nephrolithiasis measuring up to 6 mm on the RIGHT.   Electronically Signed   By: Awilda Metro   On: 07/25/2014 04:28    Microbiology: Recent Results (from the past 240 hour(s))  Clostridium Difficile by PCR     Status: None   Collection Time: 07/25/14  2:00 AM  Result Value Ref Range Status   C difficile by pcr NEGATIVE NEGATIVE Final     Labs: Basic Metabolic Panel:  Recent Labs Lab 07/25/14 0121 07/26/14 0621  NA 140 140  K 3.7 3.4*  CL 113* 109  CO2 22 23  GLUCOSE 128* 110*  BUN 12 5*  CREATININE 0.95 0.66  CALCIUM 9.3 9.0   Liver Function Tests:  Recent Labs Lab 07/25/14 0121  AST 27  ALT 16  ALKPHOS 63  BILITOT 0.5  PROT 7.3  ALBUMIN 3.4*    Recent Labs Lab 07/25/14 0121  LIPASE 26   CBC:  Recent Labs Lab 07/25/14 0121 07/25/14 1352 07/25/14 1753 07/26/14 0621 07/27/14 0658  WBC 10.4 10.7* 11.5* 12.5* 12.7*  NEUTROABS 8.8*  --   --   --   --   HGB 14.5 12.1 12.0 11.8* 11.5*  HCT 43.1 36.6 36.0 35.9* 35.6*  MCV 94.7 94.1 93.8 94.2 96.2  PLT 234 244 238 234 224    Principal Problem:   Rectal bleeding Active Problems:   Constipation   Internal hemorrhoids   Upper abdominal  pain   Nausea and vomiting   Rash   Enteritis   Time coordinating discharge: 35 minutes  Signed:  Brendia Sacks, MD [facility] 07/27/2014, 12:16 PM

## 2014-07-29 NOTE — Care Management Utilization Note (Signed)
UR completed 

## 2014-08-04 ENCOUNTER — Telehealth: Payer: Self-pay | Admitting: Gastroenterology

## 2014-08-04 NOTE — Telephone Encounter (Signed)
Please have patient return in about 4-6 weeks for hospital follow-up.

## 2014-08-05 ENCOUNTER — Encounter: Payer: Self-pay | Admitting: Internal Medicine

## 2014-08-05 NOTE — Telephone Encounter (Signed)
APPOINTMENT MADE AND LETTER SENT °

## 2014-08-07 LAB — GI PATHOGEN PANEL BY PCR, STOOL

## 2014-09-16 ENCOUNTER — Encounter: Payer: Self-pay | Admitting: Gastroenterology

## 2014-09-16 ENCOUNTER — Ambulatory Visit (INDEPENDENT_AMBULATORY_CARE_PROVIDER_SITE_OTHER): Payer: Self-pay | Admitting: Gastroenterology

## 2014-09-16 VITALS — BP 141/95 | HR 85 | Temp 96.9°F | Ht 62.0 in | Wt 235.6 lb

## 2014-09-16 DIAGNOSIS — R112 Nausea with vomiting, unspecified: Secondary | ICD-10-CM

## 2014-09-16 DIAGNOSIS — K5900 Constipation, unspecified: Secondary | ICD-10-CM

## 2014-09-16 MED ORDER — DEXLANSOPRAZOLE 60 MG PO CPDR: 60.0000 mg | DELAYED_RELEASE_CAPSULE | Freq: Every day | ORAL | Status: DC

## 2014-09-16 NOTE — Progress Notes (Signed)
Primary Care Physician: Samuel JesterBUTLER, [name], DO  Primary Gastroenterologist:  Roetta SessionsMichael Rourk, MD   Chief Complaint  Patient presents with  . Follow-up    HPI: Sandra Carroll is a 49 y.o. female here for hospital follow-up. She was hospitalized back in February with acute onset upper abdominal pain associated with vomiting and bloody diarrhea. C. difficile was negative. GI pathogen panel results not available (report says see other report). Last colonoscopy September 2014 for hematochezia with adequate prep, normal rectum except for anal canal/internal hemorrhoids. She has had hemorrhoid banding in the past. CT abdomen pelvis this recent admission showed findings suggestive of enteritis with fluid in the large bowel, no evidence of bowel obstruction or inflammation.  Now having BM twice daily taking Miralax daily. Eating gluten free diet by choice. Cut out red meat. Has lost a few pounds with dietary changes. Mostly concerned that now she is waking up first thing in the morning, feels real hot, then gagging and heaving. May have vomiting. Has been happening since she came home from the hospital. Taking some nausea tablets from over the counter. No heartburn in a couple of years. Denies abdominal pain. No melena or rectal bleeding. This had some lab work since being out of the hospital, we have requested records.   Current Outpatient Prescriptions  Medication Sig Dispense Refill  . cholecalciferol (VITAMIN D) 1000 UNITS tablet Take 1,000 Units by mouth daily.    Marland Kitchen. desogestrel-ethinyl estradiol (KARIVA,AZURETTE,MIRCETTE) 0.15-0.02/0.01 MG (21/5) tablet Take 1 tablet by mouth daily.    Marland Kitchen. FOLIC ACID PO Take 1 tablet by mouth daily.    Marland Kitchen. oxyCODONE-acetaminophen (PERCOCET) 10-325 MG per tablet Take 1 tablet by mouth 3 (three) times daily as needed for pain. For hip pain.    . polyethylene glycol (MIRALAX / GLYCOLAX) packet Take 17 g by mouth daily as needed.    . simvastatin (ZOCOR) 20 MG  tablet Take 20 mg by mouth every evening.     No current facility-administered medications for this visit.    Allergies as of 09/16/2014  . (No Known Allergies)    ROS:  General: Negative for anorexia, weight loss, fever, chills, fatigue, weakness. ENT: Negative for hoarseness, difficulty swallowing , nasal congestion. CV: Negative for chest pain, angina, palpitations, dyspnea on exertion, peripheral edema.  Respiratory: Negative for dyspnea at rest, dyspnea on exertion, cough, sputum, wheezing.  GI: See history of present illness. GU:  Negative for dysuria, hematuria, urinary incontinence, urinary frequency, nocturnal urination.  Endo: Negative for unusual weight change.    Physical Examination:   BP 141/95 mmHg  Pulse 85  Temp(Src) 96.9 F (36.1 C)  Ht 5\' 2"  (1.575 m)  Wt 235 lb 9.6 oz (106.867 kg)  BMI 43.08 kg/m2  LMP 09/01/2014  General: Well-nourished, well-developed in no acute distress.  Eyes: No icterus. Mouth: Oropharyngeal mucosa moist and pink , no lesions erythema or exudate. Lungs: Clear to auscultation bilaterally.  Heart: Regular rate and rhythm, no murmurs rubs or gallops.  Abdomen: Bowel sounds are normal, nontender, nondistended, no hepatosplenomegaly or masses, no abdominal bruits or hernia , no rebound or guarding.   Extremities: No lower extremity edema. No clubbing or deformities. Neuro: Alert and oriented x 4   Skin: Warm and dry, no jaundice.   Psych: Alert and cooperative, normal mood and affect.  Labs:  Lab Results  Component Value Date   WBC 12.7* 07/27/2014   HGB 11.5* 07/27/2014   HCT 35.6* 07/27/2014   MCV  96.2 07/27/2014   PLT 224 07/27/2014   Lab Results  Component Value Date   ALT 16 07/25/2014   AST 27 07/25/2014   ALKPHOS 63 07/25/2014   BILITOT 0.5 07/25/2014   Lab Results  Component Value Date   CREATININE 0.66 07/26/2014   BUN 5* 07/26/2014   NA 140 07/26/2014   K 3.4* 07/26/2014   CL 109 07/26/2014   CO2 23  07/26/2014   Lab Results  Component Value Date   LIPASE 26 07/25/2014    Imaging Studies: No results found.

## 2014-09-16 NOTE — Patient Instructions (Signed)
1. Trial of Dexilant 60mg  capsule take one before evening meal for next two weeks to see if helps early morning nausea. 2. Please call if your nausea doesn't go away with Dexilant. 3. I have requested copy of recent labs from your PCP for review. 4. Continue Miralax daily as needed.

## 2014-09-18 NOTE — Assessment & Plan Note (Signed)
Hospitalization in February with acute onset upper abdominal pain associated with vomiting and bloody diarrhea. Likely infectious etiology. Completely resolved. Decided to start gluten-free diet. Also taking MiraLAX. Bowel movements are regular now. Lost few pounds with dietary changes. Concerned about waking up in the early morning hours with heaving, vomiting. Associated with sensation of feeling hot. Denies typical heartburn symptoms.  Requested GI pathogen results. Requested recent labs from PCP. Trial of Dexilant 60mg  daily for vomiting possibly related to GERD. Two weeks of samples provided. She'll call with ongoing symptoms. Continue MiraLAX as needed for constipation.

## 2014-09-18 NOTE — Progress Notes (Signed)
Raynelle FanningJulie, can you help find the GI pathogen panel resultes for 07/27/14?

## 2014-09-19 NOTE — Progress Notes (Signed)
I called [facility] lab, they did not have any results for this pt. She asked me to call [facility] at [phone] and see if they had them. I tried to call [facility]- they were experiencing technical difficulties and were unable to answer any questions at this time. Will have to call back.

## 2014-09-19 NOTE — Progress Notes (Signed)
CC'ED TO PCP 

## 2014-09-22 NOTE — Progress Notes (Signed)
I tried to call [facility] again- still having technical difficulties.called [facility] and spoke with someone in micro- they couldn't help me, they dont have the results either and apparently cannot see [facility] results. I called Marchelle Folksmanda, who is our rep who comes by the office sometimes and had to leave her a voicemail. Asked her to call me back.

## 2014-09-24 NOTE — Progress Notes (Signed)
Received an email from [facility], she said she will look into it and see if she can find the report.

## 2014-09-24 NOTE — Progress Notes (Signed)
[facility]

## 2014-09-25 NOTE — Progress Notes (Signed)
Sandra Carroll came by the office, she cannot find the report. She asked me to call [facility] lab. I called them and they said they only process cdiff by pcr in the hospital lab, everything else that is stool related [facility] [address] She was very surprised and said she will look into it some more and will let me know.

## 2014-09-29 NOTE — Progress Notes (Signed)
[facility] been [facility] to [facility] this report. [facility] The results are in [facility] cart.

## 2014-09-30 NOTE — Progress Notes (Signed)
Please let patient know that we reviewed all of her records from PCP and finally obtained GI pathogen result. Hemoglobin improved after discharge to 12.2 as of February 22. White blood cell count 5200, platelets 314,000, albumin 3.6, total bilirubin 0.5, alkaline phosphatase 50, AST 21, ALT 17, albumin 3.6, GI pathogen panel was negative.  Plan as outlined at time of office visit.

## 2014-10-01 ENCOUNTER — Telehealth: Payer: Self-pay | Admitting: Internal Medicine

## 2014-10-01 NOTE — Telephone Encounter (Signed)
Pt called this afternoon saying that she had a missed call from us. She didn't listen to her VM where JL had LMOM. I told her that JL was at lunch and I would let her know that she had called. [phone]

## 2014-10-01 NOTE — Progress Notes (Signed)
Making sure routed addendum to SlickJulie.

## 2014-10-01 NOTE — Progress Notes (Signed)
Tried to call pt- NA-LMOM with information.  

## 2014-10-02 NOTE — Telephone Encounter (Signed)
Tried to call pt- NA, LMOM asked her if she got my message from the day before and told her if she had any questions about it she could call.

## 2014-10-22 ENCOUNTER — Encounter: Payer: Self-pay | Admitting: Internal Medicine

## 2016-07-04 ENCOUNTER — Telehealth: Payer: Self-pay | Admitting: *Deleted

## 2016-08-28 ENCOUNTER — Emergency Department (HOSPITAL_COMMUNITY): Payer: PRIVATE HEALTH INSURANCE

## 2016-08-28 ENCOUNTER — Encounter (HOSPITAL_COMMUNITY): Payer: Self-pay | Admitting: Emergency Medicine

## 2016-08-28 ENCOUNTER — Emergency Department (HOSPITAL_COMMUNITY)
Admission: EM | Admit: 2016-08-28 | Discharge: 2016-08-28 | Disposition: A | Discharge status: Home / Self Care | Payer: PRIVATE HEALTH INSURANCE | Attending: Emergency Medicine | Admitting: Emergency Medicine

## 2016-08-28 DIAGNOSIS — Z79899 Other long term (current) drug therapy: Secondary | ICD-10-CM | POA: Insufficient documentation

## 2016-08-28 DIAGNOSIS — R42 Dizziness and giddiness: Secondary | ICD-10-CM | POA: Diagnosis present

## 2016-08-28 DIAGNOSIS — I10 Essential (primary) hypertension: Secondary | ICD-10-CM | POA: Diagnosis not present

## 2016-08-28 DIAGNOSIS — Z7982 Long term (current) use of aspirin: Secondary | ICD-10-CM | POA: Diagnosis not present

## 2016-08-28 DIAGNOSIS — Z87891 Personal history of nicotine dependence: Secondary | ICD-10-CM | POA: Insufficient documentation

## 2016-08-28 DIAGNOSIS — R0602 Shortness of breath: Secondary | ICD-10-CM | POA: Diagnosis not present

## 2016-08-28 DIAGNOSIS — R0789 Other chest pain: Secondary | ICD-10-CM | POA: Insufficient documentation

## 2016-08-28 DIAGNOSIS — R05 Cough: Secondary | ICD-10-CM | POA: Insufficient documentation

## 2016-08-28 HISTORY — DX: Essential (primary) hypertension: I10

## 2016-08-28 LAB — BASIC METABOLIC PANEL
Anion gap: 7 (ref 5–15)
BUN: 8 mg/dL (ref 6–20)
CHLORIDE: 110 mmol/L (ref 101–111)
CO2: 23 mmol/L (ref 22–32)
CREATININE: 0.89 mg/dL (ref 0.44–1.00)
Calcium: 9.7 mg/dL (ref 8.9–10.3)
GFR calc Af Amer: >60 mL/min (ref >60)
GFR calc non Af Amer: >60 mL/min (ref >60)
Glucose, Bld: 92 mg/dL (ref 65–99)
Potassium: 3.6 mmol/L (ref 3.5–5.1)
Sodium: 140 mmol/L (ref 135–145)

## 2016-08-28 LAB — CBC
HEMATOCRIT: 36.7 % (ref 36.0–46.0)
Hemoglobin: 12.5 g/dL (ref 12.0–15.0)
MCH: 31.8 pg (ref 26.0–34.0)
MCHC: 34.1 g/dL (ref 30.0–36.0)
MCV: 93.4 fL (ref 78.0–100.0)
PLATELETS: 295 10*3/uL (ref 150–400)
RBC: 3.93 MIL/uL (ref 3.87–5.11)
RDW: 12.7 % (ref 11.5–15.5)
WBC: 6.7 10*3/uL (ref 4.0–10.5)

## 2016-08-28 LAB — I-STAT TROPONIN, ED
TROPONIN I, POC: 0 ng/mL (ref 0.00–0.08)
Troponin i, poc: 0 ng/mL (ref 0.00–0.08)

## 2016-08-28 LAB — D-DIMER, QUANTITATIVE: D-Dimer, Quant: 0.83 ug/mL-FEU — ABNORMAL HIGH (ref 0.00–0.50)

## 2016-08-28 MED ORDER — MECLIZINE HCL 12.5 MG PO TABS
25.0000 mg | ORAL_TABLET | Freq: Once | ORAL | Status: AC
Start: 2016-08-28 — End: 2016-08-28
  Administered 2016-08-28: 25 mg via ORAL
  Filled 2016-08-28: qty 2

## 2016-08-28 MED ORDER — ASPIRIN 81 MG PO CHEW
324.0000 mg | CHEWABLE_TABLET | Freq: Once | ORAL | Status: AC
  Administered 2016-08-28: 324 mg via ORAL
  Filled 2016-08-28: qty 4

## 2016-08-28 MED ORDER — IOPAMIDOL (ISOVUE-370) INJECTION 76%
100.0000 mL | Freq: Once | INTRAVENOUS | Status: AC | PRN
  Administered 2016-08-28: 100 mL via INTRAVENOUS

## 2016-08-28 MED ORDER — DICLOFENAC SODIUM 75 MG PO TBEC: 75.0000 mg | DELAYED_RELEASE_TABLET | Freq: Two times a day (BID) | ORAL | 0 refills | Status: DC

## 2016-08-28 NOTE — ED Notes (Signed)
To CT

## 2016-08-28 NOTE — ED Triage Notes (Signed)
Pt c/o left side chest pain and sob this am. Pt reports "heart fluttering" since Monday-states she was without her BP medication x 1.5 weeks prior to Monday.

## 2016-08-28 NOTE — Discharge Instructions (Signed)
Your lab and imaging tests and your ekg today are reassuring that you are not having a heart attack and no dangerous source of your symptoms today.  I suspect your chest wall is the source of your pain (muscles and ribcage).  Use the medicine prescribed and also try a heating pad 20 minutes several times daily to the area of maximum pain.  You would benefit from cardiac stress testing to better ensure heart health - call the cardiology group listed for an office visit to discuss this test.  See your doctor for a recheck this week if you have new or persistent symptoms.  Make sure you are drinking plenty of fluids.

## 2016-08-28 NOTE — ED Provider Notes (Signed)
[facility]PT Provider Note   CSN: [id number] Arrival date & time: 08/28/16  8295   By signing my name below, I, Sandra Carroll, attest that this documentation has been prepared under the direction and in the presence of Burgess Amor, PA-C. Electronically Signed: Bobbie Carroll, Scribe. 08/28/16. 8:50 AM. History   Chief Complaint Chief Complaint  Patient presents with  . Chest Pain    The history is provided by the patient. No language interpreter was used.  HPI Comments: Sandra Carroll is a 51 y.o. female who presents to the Emergency Department complaining of constant left sided, non-radiating chest pain that began around 4:30 am today. She states that she had woke up this morning with no pain. She didn't feel any pain until she got into the shower this morning. She describes the pain as a squeezing sensation. The pain worsens when she takes deep breaths. She also reports having a cough for the past 2 week which started out as productive and now is a dry cough. Yesterday she developed a headache but is currently resolved. The patient states that she is currently having a feeling of "whooziness" come over her which has been present for the past week. She denies any nausea. She denies any pain upon exertion. She also denies any weakness, leg swelling, and diaphoresis. She states that she hasn't done any extraneous work yesterday. She states that she was seen at [facility] 7 days ago for similar lightheadedness sensation, reporting an "unsetted sensation" only when she turns her head which has waxed and waned this week.  She was restarted on her BP and cholesterol  medications. She had been off of her BP meds for 1.5 weeks prior to this visit. She was also seen last week for ear problems and was told to take tylenol and have plenty of rest. She has a hx of HTN and HLD. She currently takes one baby ASA daily. Her LNMP was the 3rd week in this past month. She had not had a menstrual cycle for  a year prior to this but is on oral contraceptives to control her periods.  She denies palpitations.  Past Medical History:  Diagnosis Date  . Chronic back pain   . Complication of anesthesia 1991 and 1998   childbirth --reaction to anesthesia was itching.   . DDD (degenerative disc disease)   . Hemorrhoids   . Hypertension   . PONV (postoperative nausea and vomiting)   . Shortness of breath    quit smoking two years ago and still has some sob with exertion     Patient Active Problem List   Diagnosis Date Noted  . Internal hemorrhoids 07/25/2014  . Upper abdominal pain 07/25/2014  . Nausea and vomiting 07/25/2014  . Rash 07/25/2014  . Enteritis 07/25/2014  . Internal hemorrhoids with other complication 06/25/2013  . Rectal bleeding 02/16/2013  . Constipation 02/16/2013    Past Surgical History:  Procedure Laterality Date  . ADENOIDECTOMY  1978  . CESAREAN SECTION  1991 1998   pt had itching with 1991 c-section.   . COLONOSCOPY N/A 03/01/2013   Dr. Jena Gauss- normal rectum except for anal canal/internal hemorrhoidal tags. colonic mucosa appeared normal.  . KNEE ARTHROSCOPY  2003  . LUMBAR LAMINECTOMY/DECOMPRESSION MICRODISCECTOMY  03/02/2012   Procedure: LUMBAR LAMINECTOMY/DECOMPRESSION MICRODISCECTOMY 1 LEVEL;  Surgeon: Carmela Hurt, MD;  Location: [facility];  Service: Neurosurgery;  Laterality: Left;  LEFT Lumbar Five-Sacral One Laminectomy for Decompression     OB History  No data available       Home Medications    Prior to Admission medications   Medication Sig Start Date End Date Taking? Authorizing Provider  aspirin EC 81 MG tablet Take 81 mg by mouth daily.   Yes Historical Provider, MD  desogestrel-ethinyl estradiol (KARIVA,AZURETTE,MIRCETTE) 0.15-0.02/0.01 MG (21/5) tablet Take 1 tablet by mouth daily.   Yes Historical Provider, MD  oxyCODONE-acetaminophen (PERCOCET) 10-325 MG per tablet Take 1 tablet by mouth 3 (three) times daily as needed for pain. For  hip pain.   Yes Historical Provider, MD  polyethylene glycol (MIRALAX / GLYCOLAX) packet Take 17 g by mouth daily as needed for mild constipation or moderate constipation.    Yes Historical Provider, MD  simvastatin (ZOCOR) 20 MG tablet Take 20 mg by mouth every evening.   Yes Historical Provider, MD  UNKNOWN TO PATIENT Take 1 tablet by mouth daily. BP medication   Yes Historical Provider, MD  diclofenac (VOLTAREN) 75 MG EC tablet Take 1 tablet (75 mg total) by mouth 2 (two) times daily. 08/28/16   Burgess AmorJulie Mel Tadros, PA-C    Family History Family History  Problem Relation Age of Onset  . Hypertension Mother   . Cancer Father   . Hypertension Father   . Colon cancer Neg Hx     Social History Social History  Substance Use Topics  . Smoking status: Former Games developermoker  . Smokeless tobacco: Never Used  . Alcohol use No     Allergies   Patient has no known allergies.   Review of Systems Review of Systems  Respiratory: Positive for cough and shortness of breath.   Cardiovascular: Positive for chest pain. Negative for leg swelling.  Gastrointestinal: Negative for nausea and vomiting.  Musculoskeletal: Negative for joint swelling.  Neurological: Positive for light-headedness. Negative for tremors, syncope, facial asymmetry, weakness, numbness and headaches.    Physical Exam Updated Vital Signs BP 103/57   Pulse 69   Temp 98.2 F (36.8 C)   Resp 16   Ht 5\' 2"  (1.575 m)   Wt 90.7 kg   LMP 07/31/2016 Comment: tubal ligation, bcp  SpO2 100%   BMI 36.58 kg/m   Physical Exam  Constitutional: She is oriented to person, place, and time. She appears well-developed and well-nourished.  HENT:  Head: Normocephalic.  Right Ear: Tympanic membrane normal.  Left Ear: Tympanic membrane normal.  Eyes: EOM are normal.  No nystagmus.  Neck: Normal range of motion.  Cardiovascular: Normal rate, regular rhythm and intact distal pulses.   No murmur heard. Pulmonary/Chest: Effort normal. No  respiratory distress. She has no wheezes. She exhibits tenderness.  Focal tenderness left mid chest with no palpable deformity.  Abdominal: She exhibits no distension.  Musculoskeletal: Normal range of motion.  Calves are soft and non-tender. No ankle edema.  Neurological: She is alert and oriented to person, place, and time. She has normal strength. No cranial nerve deficit or sensory deficit. Coordination and gait normal.  Normal heel/shin.  Skin:  No pronator drifts. 5/5 strengths in all 4 extremities.  Psychiatric: She has a normal mood and affect.  Nursing note and vitals reviewed.   ED Treatments / Results  DIAGNOSTIC STUDIES: Oxygen Saturation is 99% on RA, normal by my interpretation.    COORDINATION OF CARE: 8:30 AM Discussed treatment plan with pt at bedside and pt agreed to plan. I will check the patient's EKG, blood work, and CXR.  Labs (all labs ordered are listed, but only abnormal results are displayed)  Labs Reviewed  D-DIMER, QUANTITATIVE (NOT AT [facility]) - Abnormal; Notable for the following:       Result Value   D-Dimer, Quant 0.83 (*)    All other components within normal limits  BASIC METABOLIC PANEL  CBC  I-STAT TROPOININ, ED  POC URINE PREG, ED  I-STAT TROPOININ, ED    EKG  EKG Interpretation  Date/Time:  Sunday August 28 2016 08:13:38 EDT Ventricular Rate:  64 PR Interval:    QRS Duration: 102 QT Interval:  393 QTC Calculation: 406 R Axis:   69 Text Interpretation:  Sinus rhythm Borderline short PR interval Low voltage, precordial leads Borderline T abnormalities, anterior leads Baseline wander in lead(s) V2 Confirmed by [facility] MD, Bary Limbach ([id number]) on 08/28/2016 8:16:17 AM       Radiology Dg Chest 2 View  Result Date: 08/28/2016 CLINICAL DATA:  Chest pain EXAM: CHEST  2 VIEW COMPARISON:  None. FINDINGS: Cardiac shadow is within normal limits. The lungs are well aerated bilaterally. No focal infiltrate or sizable effusion is seen. IMPRESSION: No  active cardiopulmonary disease. Electronically Signed   By: Alcide Clever M.D.   On: 08/28/2016 08:54   Ct Angio Chest Pe W Or Wo Contrast  Result Date: 08/28/2016 CLINICAL DATA:  Chest pain for several hours EXAM: CT ANGIOGRAPHY CHEST WITH CONTRAST TECHNIQUE: Multidetector CT imaging of the chest was performed using the standard protocol during bolus administration of intravenous contrast. Multiplanar CT image reconstructions and MIPs were obtained to evaluate the vascular anatomy. CONTRAST:  100 mL Isovue 370. COMPARISON:  None. FINDINGS: Cardiovascular: Thoracic aorta shows no significant aneurysmal dilatation or dissection. No coronary calcifications are seen. The pulmonary artery as visualized and demonstrates a normal branching pattern. No filling defects to suggest pulmonary emboli are identified. Mediastinum/Nodes: No significant hilar or mediastinal adenopathy is noted. The thoracic inlet is within normal limits. Lungs/Pleura: Lungs are clear. No pleural effusion or pneumothorax. Upper Abdomen: No acute abnormality. Musculoskeletal: No chest wall abnormality. No acute or significant osseous findings. Review of the MIP images confirms the above findings. IMPRESSION: No evidence of pulmonary emboli.  No acute abnormality noted. Electronically Signed   By: Alcide Clever M.D.   On: 08/28/2016 13:10    Procedures Procedures (including critical care time)  Medications Ordered in ED Medications  aspirin chewable tablet 324 mg (324 mg Oral Given 08/28/16 0920)  meclizine (ANTIVERT) tablet 25 mg (25 mg Oral Given 08/28/16 0920)  iopamidol (ISOVUE-370) 76 % injection 100 mL (100 mLs Intravenous Contrast Given 08/28/16 1210)     Initial Impression / Assessment and Plan / ED Course  I have reviewed the triage vital signs and the nursing notes.  Pertinent labs & imaging results that were available during my care of the patient were reviewed by me and considered in my medical decision making (see chart  for details).     Pt with reproducible chest wall pain, negative delta troponins, elevated d dimer, but negative CTA ruling out PE. Pt was not per negative given OCP use.  Exam and hx suggesting chest wall pain/musculoskeletal source of sx.  She was given meclizine for lightheadedness, no improvement, she is not orthostatic.  No evidence for dehydration, normal neuro exam, ambulatory without deficit.  She has had recent uri sx, nasal congestion, currently taking mucinex.  Exam not c/w vertigo, no neuro findings on exam.  Ambulatory without deficit.  Pt placed on diclofenac, advised f/u with pcp prn.  Pt has never had screening cardiac stress test, with risk  factors, will refer her to cardiology for this.   Final Clinical Impressions(s) / ED Diagnoses   Final diagnoses:  Chest wall pain    New Prescriptions New Prescriptions   DICLOFENAC (VOLTAREN) 75 MG EC TABLET    Take 1 tablet (75 mg total) by mouth 2 (two) times daily.   I personally performed the services described in this documentation, which was scribed in my presence. The recorded information has been reviewed and is accurate.    Burgess Amor, PA-C 08/28/16 1411    Jacalyn Lefevre, MD 08/28/16 [phone]

## 2016-08-28 NOTE — ED Notes (Signed)
PA in  To reassess

## 2016-10-04 ENCOUNTER — Encounter (INDEPENDENT_AMBULATORY_CARE_PROVIDER_SITE_OTHER): Payer: Self-pay

## 2016-10-04 ENCOUNTER — Encounter: Payer: Self-pay | Admitting: Physician Assistant

## 2016-10-04 ENCOUNTER — Ambulatory Visit (INDEPENDENT_AMBULATORY_CARE_PROVIDER_SITE_OTHER): Payer: PRIVATE HEALTH INSURANCE | Admitting: Physician Assistant

## 2016-10-04 VITALS — BP 135/89 | HR 82 | Temp 97.2°F | Ht 62.0 in | Wt 204.0 lb

## 2016-10-04 DIAGNOSIS — E7800 Pure hypercholesterolemia, unspecified: Secondary | ICD-10-CM | POA: Diagnosis not present

## 2016-10-04 DIAGNOSIS — M5136 Other intervertebral disc degeneration, lumbar region: Secondary | ICD-10-CM

## 2016-10-04 DIAGNOSIS — K5900 Constipation, unspecified: Secondary | ICD-10-CM | POA: Diagnosis not present

## 2016-10-04 DIAGNOSIS — I10 Essential (primary) hypertension: Secondary | ICD-10-CM

## 2016-10-04 DIAGNOSIS — N938 Other specified abnormal uterine and vaginal bleeding: Secondary | ICD-10-CM

## 2016-10-04 DIAGNOSIS — M51369 Other intervertebral disc degeneration, lumbar region without mention of lumbar back pain or lower extremity pain: Secondary | ICD-10-CM

## 2016-10-04 MED ORDER — LISINOPRIL 20 MG PO TABS: 20.0000 mg | ORAL_TABLET | Freq: Every day | ORAL | 11 refills | Status: DC

## 2016-10-04 MED ORDER — OXYCODONE-ACETAMINOPHEN 10-325 MG PO TABS: 1.0000 | ORAL_TABLET | Freq: Three times a day (TID) | ORAL | 0 refills | Status: DC | PRN

## 2016-10-04 MED ORDER — DESOGESTREL-ETHINYL ESTRADIOL 0.15-0.02/0.01 MG (21/5) PO TABS: 1.0000 | ORAL_TABLET | Freq: Every day | ORAL | 12 refills | Status: DC

## 2016-10-04 MED ORDER — MELOXICAM 7.5 MG PO TABS: 7.5000 mg | ORAL_TABLET | Freq: Every day | ORAL | 11 refills | Status: DC

## 2016-10-04 MED ORDER — SIMVASTATIN 20 MG PO TABS: 20.0000 mg | ORAL_TABLET | Freq: Every evening | ORAL | 11 refills | Status: DC

## 2016-10-04 NOTE — Patient Instructions (Signed)
DASH Eating Plan DASH stands for "Dietary Approaches to Stop Hypertension." The DASH eating plan is a healthy eating plan that has been shown to reduce high blood pressure (hypertension). It may also reduce your risk for type 2 diabetes, heart disease, and stroke. The DASH eating plan may also help with weight loss. What are tips for following this plan? General guidelines  Avoid eating more than 2,300 mg (milligrams) of salt (sodium) a day. If you have hypertension, you may need to reduce your sodium intake to 1,500 mg a day.  Limit alcohol intake to no more than 1 drink a day for nonpregnant women and 2 drinks a day for men. One drink equals 12 oz of beer, 5 oz of wine, or 1 oz of hard liquor.  Work with your health care provider to maintain a healthy body weight or to lose weight. Ask what an ideal weight is for you.  Get at least 30 minutes of exercise that causes your heart to beat faster (aerobic exercise) most days of the week. Activities may include walking, swimming, or biking.  Work with your health care provider or diet and nutrition specialist (dietitian) to adjust your eating plan to your individual calorie needs. Reading food labels  Check food labels for the amount of sodium per serving. Choose foods with less than 5 percent of the Daily Value of sodium. Generally, foods with less than 300 mg of sodium per serving fit into this eating plan.  To find whole grains, look for the word "whole" as the first word in the ingredient list. Shopping  Buy products labeled as "low-sodium" or "no salt added."  Buy fresh foods. Avoid canned foods and premade or frozen meals. Cooking  Avoid adding salt when cooking. Use salt-free seasonings or herbs instead of table salt or sea salt. Check with your health care provider or pharmacist before using salt substitutes.  Do not fry foods. Cook foods using healthy methods such as baking, boiling, grilling, and broiling instead.  Cook with  heart-healthy oils, such as olive, canola, soybean, or sunflower oil. Meal planning   Eat a balanced diet that includes: ? 5 or more servings of fruits and vegetables each day. At each meal, try to fill half of your plate with fruits and vegetables. ? Up to 6-8 servings of whole grains each day. ? Less than 6 oz of lean meat, poultry, or fish each day. A 3-oz serving of meat is about the same size as a deck of cards. One egg equals 1 oz. ? 2 servings of low-fat dairy each day. ? A serving of nuts, seeds, or beans 5 times each week. ? Heart-healthy fats. Healthy fats called Omega-3 fatty acids are found in foods such as flaxseeds and coldwater fish, like sardines, salmon, and mackerel.  Limit how much you eat of the following: ? Canned or prepackaged foods. ? Food that is high in trans fat, such as fried foods. ? Food that is high in saturated fat, such as fatty meat. ? Sweets, desserts, sugary drinks, and other foods with added sugar. ? Full-fat dairy products.  Do not salt foods before eating.  Try to eat at least 2 vegetarian meals each week.  Eat more home-cooked food and less restaurant, buffet, and fast food.  When eating at a restaurant, ask that your food be prepared with less salt or no salt, if possible. What foods are recommended? The items listed may not be a complete list. Talk with your dietitian about what   dietary choices are best for you. Grains Whole-grain or whole-wheat bread. Whole-grain or whole-wheat pasta. Brown rice. Oatmeal. Quinoa. Bulgur. Whole-grain and low-sodium cereals. Pita bread. Low-fat, low-sodium crackers. Whole-wheat flour tortillas. Vegetables Fresh or frozen vegetables (raw, steamed, roasted, or grilled). Low-sodium or reduced-sodium tomato and vegetable juice. Low-sodium or reduced-sodium tomato sauce and tomato paste. Low-sodium or reduced-sodium canned vegetables. Fruits All fresh, dried, or frozen fruit. Canned fruit in natural juice (without  added sugar). Meat and other protein foods Skinless chicken or turkey. Ground chicken or turkey. Pork with fat trimmed off. Fish and seafood. Egg whites. Dried beans, peas, or lentils. Unsalted nuts, nut butters, and seeds. Unsalted canned beans. Lean cuts of beef with fat trimmed off. Low-sodium, lean deli meat. Dairy Low-fat (1%) or fat-free (skim) milk. Fat-free, low-fat, or reduced-fat cheeses. Nonfat, low-sodium ricotta or cottage cheese. Low-fat or nonfat yogurt. Low-fat, low-sodium cheese. Fats and oils Soft margarine without trans fats. Vegetable oil. Low-fat, reduced-fat, or light mayonnaise and salad dressings (reduced-sodium). Canola, safflower, olive, soybean, and sunflower oils. Avocado. Seasoning and other foods Herbs. Spices. Seasoning mixes without salt. Unsalted popcorn and pretzels. Fat-free sweets. What foods are not recommended? The items listed may not be a complete list. Talk with your dietitian about what dietary choices are best for you. Grains Baked goods made with fat, such as croissants, muffins, or some breads. Dry pasta or rice meal packs. Vegetables Creamed or fried vegetables. Vegetables in a cheese sauce. Regular canned vegetables (not low-sodium or reduced-sodium). Regular canned tomato sauce and paste (not low-sodium or reduced-sodium). Regular tomato and vegetable juice (not low-sodium or reduced-sodium). Pickles. Olives. Fruits Canned fruit in a light or heavy syrup. Fried fruit. Fruit in cream or butter sauce. Meat and other protein foods Fatty cuts of meat. Ribs. Fried meat. Bacon. Sausage. Bologna and other processed lunch meats. Salami. Fatback. Hotdogs. Bratwurst. Salted nuts and seeds. Canned beans with added salt. Canned or smoked fish. Whole eggs or egg yolks. Chicken or turkey with skin. Dairy Whole or 2% milk, cream, and half-and-half. Whole or full-fat cream cheese. Whole-fat or sweetened yogurt. Full-fat cheese. Nondairy creamers. Whipped toppings.  Processed cheese and cheese spreads. Fats and oils Butter. Stick margarine. Lard. Shortening. Ghee. Bacon fat. Tropical oils, such as coconut, palm kernel, or palm oil. Seasoning and other foods Salted popcorn and pretzels. Onion salt, garlic salt, seasoned salt, table salt, and sea salt. Worcestershire sauce. Tartar sauce. Barbecue sauce. Teriyaki sauce. Soy sauce, including reduced-sodium. Steak sauce. Canned and packaged gravies. Fish sauce. Oyster sauce. Cocktail sauce. Horseradish that you find on the shelf. Ketchup. Mustard. Meat flavorings and tenderizers. Bouillon cubes. Hot sauce and Tabasco sauce. Premade or packaged marinades. Premade or packaged taco seasonings. Relishes. Regular salad dressings. Where to find more information:  National Heart, Lung, and Blood Institute: www.nhlbi.nih.gov  American Heart Association: www.heart.org Summary  The DASH eating plan is a healthy eating plan that has been shown to reduce high blood pressure (hypertension). It may also reduce your risk for type 2 diabetes, heart disease, and stroke.  With the DASH eating plan, you should limit salt (sodium) intake to 2,300 mg a day. If you have hypertension, you may need to reduce your sodium intake to 1,500 mg a day.  When on the DASH eating plan, aim to eat more fresh fruits and vegetables, whole grains, lean proteins, low-fat dairy, and heart-healthy fats.  Work with your health care provider or diet and nutrition specialist (dietitian) to adjust your eating plan to your individual   calorie needs. This information is not intended to replace advice given to you by your health care provider. Make sure you discuss any questions you have with your health care provider. Document Released: 05/26/2011 Document Revised: 05/30/2016 Document Reviewed: 05/30/2016 Elsevier Interactive Patient Education  2017 [employer]  

## 2016-10-04 NOTE — Progress Notes (Signed)
BP 135/89   Pulse 82   Temp 97.2 F (36.2 C) (Oral)   Ht  (1.575 m)   Wt 204 lb (92.5 kg)   BMI 37.31 kg/m    Subjective:    Patient ID: Sandra Carroll, female    DOB: [birth year], 51 y.o.   MRN: [id number]  HPI: Sandra Carroll is a 51 y.o. female presenting on 10/04/2016 for Follow-up (Establish here with you )  This patient comes in for periodic recheck on medications and conditions including hypertension, DDD lumbar with previous surgery, constipation, hypercholesterol. She has been having back back upon rising and after sitting during the day. Denies radiculopathy, weakness or numbness.  Due well labs in 3 months, will do at the next visit.   All medications are reviewed today. There are no reports of any problems with the medications. All of the medical conditions are reviewed and updated.  Lab work is reviewed and will be ordered as medically necessary. There are no new problems reported with today's visit.   Relevant past medical, surgical, family and social history reviewed and updated as indicated. Allergies and medications reviewed and updated.  Past Medical History:  Diagnosis Date  . Chronic back pain   . Complication of anesthesia 1991 and 1998   childbirth --reaction to anesthesia was itching.   . DDD (degenerative disc disease)   . Hemorrhoids   . Hyperlipidemia   . Hypertension   . PONV (postoperative nausea and vomiting)   . Shortness of breath    quit smoking two years ago and still has some sob with exertion     Past Surgical History:  Procedure Laterality Date  . ADENOIDECTOMY  1978  . CESAREAN SECTION  1991 1998   pt had itching with 1991 c-section.   . COLONOSCOPY N/A 03/01/2013   Dr. Jena Gauss- normal rectum except for anal canal/internal hemorrhoidal tags. colonic mucosa appeared normal.  . KNEE ARTHROSCOPY  2003  . LUMBAR LAMINECTOMY/DECOMPRESSION MICRODISCECTOMY  03/02/2012   Procedure: LUMBAR LAMINECTOMY/DECOMPRESSION MICRODISCECTOMY 1 LEVEL;   Surgeon: Carmela Hurt, MD;  Location: [facility];  Service: Neurosurgery;  Laterality: Left;  LEFT Lumbar Five-Sacral One Laminectomy for Decompression     Review of Systems  Constitutional: Negative.  Negative for activity change, fatigue and fever.  HENT: Negative.   Eyes: Negative.   Respiratory: Negative.  Negative for cough.   Cardiovascular: Negative.  Negative for chest pain.  Gastrointestinal: Negative.  Negative for abdominal pain.  Endocrine: Negative.   Genitourinary: Negative.  Negative for dysuria.  Musculoskeletal: Positive for back pain, gait problem and myalgias.  Skin: Negative.     Allergies as of 10/04/2016   No Known Allergies     Medication List       Accurate as of 10/04/16  3:53 PM. Always use your most recent med list.          aspirin EC 81 MG tablet Take 81 mg by mouth daily.   desogestrel-ethinyl estradiol 0.15-0.02/0.01 MG (21/5) tablet Commonly known as:  KARIVA,AZURETTE,MIRCETTE Take 1 tablet by mouth daily.   lisinopril 20 MG tablet Commonly known as:  PRINIVIL,ZESTRIL Take 1 tablet (20 mg total) by mouth daily.   meloxicam 7.5 MG tablet Commonly known as:  MOBIC Take 1 tablet (7.5 mg total) by mouth daily.   oxyCODONE-acetaminophen 10-325 MG tablet Commonly known as:  PERCOCET Take 1 tablet by mouth 3 (three) times daily as needed for pain. For hip pain.   oxyCODONE-acetaminophen 10-325 MG  tablet Commonly known as:  PERCOCET Take 1 tablet by mouth every 8 (eight) hours as needed for pain.   oxyCODONE-acetaminophen 10-325 MG tablet Commonly known as:  PERCOCET Take 1 tablet by mouth every 8 (eight) hours as needed for pain.   polyethylene glycol packet Commonly known as:  MIRALAX / GLYCOLAX Take 17 g by mouth daily as needed for mild constipation or moderate constipation.   simvastatin 20 MG tablet Commonly known as:  ZOCOR Take 1 tablet (20 mg total) by mouth every evening.          Objective:    BP 135/89    Pulse 82   Temp 97.2 F (36.2 C) (Oral)   Ht  (1.575 m)   Wt 204 lb (92.5 kg)   BMI 37.31 kg/m   No Known Allergies  Physical Exam  Constitutional: She is oriented to person, place, and time. She appears well-developed and well-nourished.  HENT:  Head: Normocephalic and atraumatic.  Eyes: Conjunctivae and EOM are normal. Pupils are equal, round, and reactive to light.  Cardiovascular: Normal rate, regular rhythm, normal heart sounds and intact distal pulses.   Pulmonary/Chest: Effort normal and breath sounds normal.  Abdominal: Soft. Bowel sounds are normal.  Musculoskeletal:       Right shoulder: She exhibits decreased range of motion, tenderness and spasm.       Lumbar back: She exhibits decreased range of motion, tenderness, pain and spasm.  Neurological: She is alert and oriented to person, place, and time. She has normal reflexes.  Skin: Skin is warm and dry. No rash noted.  Psychiatric: She has a normal mood and affect. Her behavior is normal. Judgment and thought content normal.    Results for orders placed or performed during the hospital encounter of 08/28/16  Basic metabolic panel  Result Value Ref Range   Sodium 140 135 - 145 mmol/L   Potassium 3.6 3.5 - 5.1 mmol/L   Chloride 110 101 - 111 mmol/L   CO2 23 22 - 32 mmol/L   Glucose, Bld 92 65 - 99 mg/dL   BUN 8 6 - 20 mg/dL   Creatinine, Ser 0.96 0.44 - 1.00 mg/dL   Calcium 9.7 8.9 - 04.5 mg/dL   GFR calc non Af Amer >60 >60 mL/min   GFR calc Af Amer >60 >60 mL/min   Anion gap 7 5 - 15  CBC  Result Value Ref Range   WBC 6.7 4.0 - 10.5 K/uL   RBC 3.93 3.87 - 5.11 MIL/uL   Hemoglobin 12.5 12.0 - 15.0 g/dL   HCT 40.9 81.1 - 91.4 %   MCV 93.4 78.0 - 100.0 fL   MCH 31.8 26.0 - 34.0 pg   MCHC 34.1 30.0 - 36.0 g/dL   RDW 78.2 95.6 - 21.3 %   Platelets 295 150 - 400 K/uL  D-dimer, quantitative (not at [facility])  Result Value Ref Range   D-Dimer, Quant 0.83 (H) 0.00 - 0.50 ug/mL-FEU  I-stat troponin, ED  Result  Value Ref Range   Troponin i, poc 0.00 0.00 - 0.08 ng/mL   Comment 3          I-stat troponin, ED  Result Value Ref Range   Troponin i, poc 0.00 0.00 - 0.08 ng/mL   Comment 3              Assessment & Plan:   1. DDD (degenerative disc disease), lumbar - oxyCODONE-acetaminophen (PERCOCET) 10-325 MG tablet; Take 1 tablet by mouth 3 (  three) times daily as needed for pain. For hip pain.  Dispense: 90 tablet; Refill: 0 - oxyCODONE-acetaminophen (PERCOCET) 10-325 MG tablet; Take 1 tablet by mouth every 8 (eight) hours as needed for pain.  Dispense: 90 tablet; Refill: 0 - oxyCODONE-acetaminophen (PERCOCET) 10-325 MG tablet; Take 1 tablet by mouth every 8 (eight) hours as needed for pain.  Dispense: 90 tablet; Refill: 0  2. Constipation, unspecified constipation type  3. Essential hypertension - lisinopril (PRINIVIL,ZESTRIL) 20 MG tablet; Take 1 tablet (20 mg total) by mouth daily.  Dispense: 30 tablet; Refill: 11  4. Pure hypercholesterolemia - simvastatin (ZOCOR) 20 MG tablet; Take 1 tablet (20 mg total) by mouth every evening.  Dispense: 30 tablet; Refill: 11  5. DUB (dysfunctional uterine bleeding) - desogestrel-ethinyl estradiol (KARIVA,AZURETTE,MIRCETTE) 0.15-0.02/0.01 MG (21/5) tablet; Take 1 tablet by mouth daily.  Dispense: 1 Package; Refill: 12   Current Outpatient Prescriptions:  .  aspirin EC 81 MG tablet, Take 81 mg by mouth daily., Disp: , Rfl:  .  desogestrel-ethinyl estradiol (KARIVA,AZURETTE,MIRCETTE) 0.15-0.02/0.01 MG (21/5) tablet, Take 1 tablet by mouth daily., Disp: 1 Package, Rfl: 12 .  lisinopril (PRINIVIL,ZESTRIL) 20 MG tablet, Take 1 tablet (20 mg total) by mouth daily., Disp: 30 tablet, Rfl: 11 .  oxyCODONE-acetaminophen (PERCOCET) 10-325 MG tablet, Take 1 tablet by mouth 3 (three) times daily as needed for pain. For hip pain., Disp: 90 tablet, Rfl: 0 .  polyethylene glycol (MIRALAX / GLYCOLAX) packet, Take 17 g by mouth daily as needed for mild constipation or  moderate constipation. , Disp: , Rfl:  .  simvastatin (ZOCOR) 20 MG tablet, Take 1 tablet (20 mg total) by mouth every evening., Disp: 30 tablet, Rfl: 11 .  meloxicam (MOBIC) 7.5 MG tablet, Take 1 tablet (7.5 mg total) by mouth daily., Disp: 30 tablet, Rfl: 11 .  oxyCODONE-acetaminophen (PERCOCET) 10-325 MG tablet, Take 1 tablet by mouth every 8 (eight) hours as needed for pain., Disp: 90 tablet, Rfl: 0 .  oxyCODONE-acetaminophen (PERCOCET) 10-325 MG tablet, Take 1 tablet by mouth every 8 (eight) hours as needed for pain., Disp: 90 tablet, Rfl: 0  Continue all other maintenance medications as listed above.  Follow up plan: Return in about 3 months (around 01/03/2017) for recheck meds and labs.  Educational handout given for DASH diet  Remus Loffler PA-C [facility] [address]  [place], [place] [address] [phone]   10/04/2016, 3:53 PM

## 2017-01-03 ENCOUNTER — Encounter: Payer: Self-pay | Admitting: Physician Assistant

## 2017-01-03 ENCOUNTER — Ambulatory Visit (INDEPENDENT_AMBULATORY_CARE_PROVIDER_SITE_OTHER): Payer: PRIVATE HEALTH INSURANCE | Admitting: Physician Assistant

## 2017-01-03 VITALS — Temp 98.0°F | Ht 62.0 in | Wt 205.8 lb

## 2017-01-03 DIAGNOSIS — M5136 Other intervertebral disc degeneration, lumbar region: Secondary | ICD-10-CM

## 2017-01-03 DIAGNOSIS — M5431 Sciatica, right side: Secondary | ICD-10-CM | POA: Insufficient documentation

## 2017-01-03 MED ORDER — OXYCODONE-ACETAMINOPHEN 10-325 MG PO TABS: 1.0000 | ORAL_TABLET | Freq: Three times a day (TID) | ORAL | 0 refills | Status: DC | PRN

## 2017-01-03 MED ORDER — OXYCODONE-ACETAMINOPHEN 10-325 MG PO TABS
1.0000 | ORAL_TABLET | Freq: Three times a day (TID) | ORAL | 0 refills | Status: DC | PRN
Start: 2017-01-03 — End: 2017-03-28

## 2017-01-03 NOTE — Patient Instructions (Addendum)
In a few days you may receive a survey in the mail or online from [employer] regarding your visit with us today. Please take a moment to fill this out. Your feedback is very important to our whole office. It can help us better understand your needs as well as improve your experience and satisfaction. Thank you for taking your time to complete it. We care about you.  Shakiera Edelson, PA-C  Sciatica Rehab Ask your health care provider which exercises are safe for you. Do exercises exactly as told by your health care provider and adjust them as directed. It is normal to feel mild stretching, pulling, tightness, or discomfort as you do these exercises, but you should stop right away if you feel sudden pain or your pain gets worse.Do not begin these exercises until told by your health care provider. Stretching and range of motion exercises These exercises warm up your muscles and joints and improve the movement and flexibility of your hips and your back. These exercises also help to relieve pain, numbness, and tingling. Exercise A: Sciatic nerve glide 1. Sit in a chair with your head facing down toward your chest. Place your hands behind your back. Let your shoulders slump forward. 2. Slowly straighten one of your knees while you tilt your head back as if you are looking toward the ceiling. Only straighten your leg as far as you can without making your symptoms worse. 3. Hold for __________ seconds. 4. Slowly return to the starting position. 5. Repeat with your other leg. Repeat __________ times. Complete this exercise __________ times a day. Exercise B: Knee to chest with hip adduction and internal rotation  1. Lie on your back on a firm surface with both legs straight. 2. Bend one of your knees and move it up toward your chest until you feel a gentle stretch in your lower back and buttock. Then, move your knee toward the shoulder that is on the opposite side from your leg. ? Hold your leg in this  position by holding onto the front of your knee. 3. Hold for __________ seconds. 4. Slowly return to the starting position. 5. Repeat with your other leg. Repeat __________ times. Complete this exercise __________ times a day. Exercise C: Prone extension on elbows  1. Lie on your abdomen on a firm surface. A bed may be too soft for this exercise. 2. Prop yourself up on your elbows. 3. Use your arms to help lift your chest up until you feel a gentle stretch in your abdomen and your lower back. ? This will place some of your body weight on your elbows. If this is uncomfortable, try stacking pillows under your chest. ? Your hips should stay down, against the surface that you are lying on. Keep your hip and back muscles relaxed. 4. Hold for __________ seconds. 5. Slowly relax your upper body and return to the starting position. Repeat __________ times. Complete this exercise __________ times a day. Strengthening exercises These exercises build strength and endurance in your back. Endurance is the ability to use your muscles for a long time, even after they get tired. Exercise D: Pelvic tilt 1. Lie on your back on a firm surface. Bend your knees and keep your feet flat. 2. Tense your abdominal muscles. Tip your pelvis up toward the ceiling and flatten your lower back into the floor. ? To help with this exercise, you may place a small towel under your lower back and try to push your back into   the towel. 3. Hold for __________ seconds. 4. Let your muscles relax completely before you repeat this exercise. Repeat __________ times. Complete this exercise __________ times a day. Exercise E: Alternating arm and leg raises  1. Get on your hands and knees on a firm surface. If you are on a hard floor, you may want to use padding to cushion your knees, such as an exercise mat. 2. Line up your arms and legs. Your hands should be below your shoulders, and your knees should be below your hips. 3. Lift your  left leg behind you. At the same time, raise your right arm and straighten it in front of you. ? Do not lift your leg higher than your hip. ? Do not lift your arm higher than your shoulder. ? Keep your abdominal and back muscles tight. ? Keep your hips facing the ground. ? Do not arch your back. ? Keep your balance carefully, and do not hold your breath. 4. Hold for __________ seconds. 5. Slowly return to the starting position and repeat with your right leg and your left arm. Repeat __________ times. Complete this exercise __________ times a day. Posture and body mechanics  Body mechanics refers to the movements and positions of your body while you do your daily activities. Posture is part of body mechanics. Good posture and healthy body mechanics can help to relieve stress in your body's tissues and joints. Good posture means that your spine is in its natural S-curve position (your spine is neutral), your shoulders are pulled back slightly, and your head is not tipped forward. The following are general guidelines for applying improved posture and body mechanics to your everyday activities. Standing   When standing, keep your spine neutral and your feet about hip-width apart. Keep a slight bend in your knees. Your ears, shoulders, and hips should line up.  When you do a task in which you stand in one place for a long time, place one foot up on a stable object that is 2-4 inches (5-10 cm) high, such as a footstool. This helps keep your spine neutral. Sitting   When sitting, keep your spine neutral and keep your feet flat on the floor. Use a footrest, if necessary, and keep your thighs parallel to the floor. Avoid rounding your shoulders, and avoid tilting your head forward.  When working at a desk or a computer, keep your desk at a height where your hands are slightly lower than your elbows. Slide your chair under your desk so you are close enough to maintain good posture.  When working at a  computer, place your monitor at a height where you are looking straight ahead and you do not have to tilt your head forward or downward to look at the screen. Resting   When lying down and resting, avoid positions that are most painful for you.  If you have pain with activities such as sitting, bending, stooping, or squatting (flexion-based activities), lie in a position in which your body does not bend very much. For example, avoid curling up on your side with your arms and knees near your chest (fetal position).  If you have pain with activities such as standing for a long time or reaching with your arms (extension-based activities), lie with your spine in a neutral position and bend your knees slightly. Try the following positions: ? Lying on your side with a pillow between your knees. ? Lying on your back with a pillow under your knees. Lifting  When lifting objects, keep your feet at least shoulder-width apart and tighten your abdominal muscles.  Bend your knees and hips and keep your spine neutral. It is important to lift using the strength of your legs, not your back. Do not lock your knees straight out.  Always ask for help to lift heavy or awkward objects. This information is not intended to replace advice given to you by your health care provider. Make sure you discuss any questions you have with your health care provider. Document Released: 06/06/2005 Document Revised: 02/11/2016 Document Reviewed: 02/20/2015 Elsevier Interactive Patient Education  [employer]2018 [employer] Inc[employer]

## 2017-01-03 NOTE — Progress Notes (Signed)
Temp 98 F (36.7 C) (Oral)   Ht 5\' 2"  (1.575 m)   Wt 205 lb 12.8 oz (93.4 kg)   BMI 37.64 kg/m    Subjective:    Patient ID: Sandra Carroll, female    DOB: 03/07/1966, 51 y.o.   MRN: [id number]  HPI: Sandra Slickerammy W Huntley is a 51 y.o. female presenting on 01/03/2017 for Follow-up (3 month ) and Medication Refill  This patient comes in for periodic recheck on medications and conditions including DDD and sciatica right side.   All medications are reviewed today. There are no reports of any problems with the medications. All of the medical conditions are reviewed and updated.  Lab work is reviewed and will be ordered as medically necessary. There are no new problems reported with today's visit.   Relevant past medical, surgical, family and social history reviewed and updated as indicated. Allergies and medications reviewed and updated.  Past Medical History:  Diagnosis Date  . Chronic back pain   . Complication of anesthesia 1991 and 1998   childbirth --reaction to anesthesia was itching.   . DDD (degenerative disc disease)   . Hemorrhoids   . Hyperlipidemia   . Hypertension   . PONV (postoperative nausea and vomiting)   . Shortness of breath    quit smoking two years ago and still has some sob with exertion     Past Surgical History:  Procedure Laterality Date  . ADENOIDECTOMY  1978  . CESAREAN SECTION  1991 1998   pt had itching with 1991 c-section.   . COLONOSCOPY N/A 03/01/2013   Dr. Jena Gaussourk- normal rectum except for anal canal/internal hemorrhoidal tags. colonic mucosa appeared normal.  . KNEE ARTHROSCOPY  2003  . LUMBAR LAMINECTOMY/DECOMPRESSION MICRODISCECTOMY  03/02/2012   Procedure: LUMBAR LAMINECTOMY/DECOMPRESSION MICRODISCECTOMY 1 LEVEL;  Surgeon: Carmela HurtKyle L Cabbell, MD;  Location: [facility];  Service: Neurosurgery;  Laterality: Left;  LEFT Lumbar Five-Sacral One Laminectomy for Decompression     Review of Systems  Constitutional: Negative.   HENT: Negative.   Eyes:  Negative.   Respiratory: Negative.   Gastrointestinal: Negative.   Genitourinary: Negative.   Musculoskeletal: Positive for back pain, gait problem and myalgias.    Allergies as of 01/03/2017   No Known Allergies     Medication List       Accurate as of 01/03/17  3:59 PM. Always use your most recent med list.          aspirin EC 81 MG tablet Take 81 mg by mouth daily.   desogestrel-ethinyl estradiol 0.15-0.02/0.01 MG (21/5) tablet Commonly known as:  KARIVA,AZURETTE,MIRCETTE Take 1 tablet by mouth daily.   lisinopril 20 MG tablet Commonly known as:  PRINIVIL,ZESTRIL Take 1 tablet (20 mg total) by mouth daily.   meloxicam 7.5 MG tablet Commonly known as:  MOBIC Take 1 tablet (7.5 mg total) by mouth daily.   oxyCODONE-acetaminophen 10-325 MG tablet Commonly known as:  PERCOCET Take 1 tablet by mouth 3 (three) times daily as needed for pain. For hip pain.   oxyCODONE-acetaminophen 10-325 MG tablet Commonly known as:  PERCOCET Take 1 tablet by mouth every 8 (eight) hours as needed for pain.   oxyCODONE-acetaminophen 10-325 MG tablet Commonly known as:  PERCOCET Take 1 tablet by mouth every 8 (eight) hours as needed for pain.   polyethylene glycol packet Commonly known as:  MIRALAX / GLYCOLAX Take 17 g by mouth daily as needed for mild constipation or moderate constipation.   simvastatin 20 MG  tablet Commonly known as:  ZOCOR Take 1 tablet (20 mg total) by mouth every evening.          Objective:    Temp 98 F (36.7 C) (Oral)   Ht 5\' 2"  (1.575 m)   Wt 205 lb 12.8 oz (93.4 kg)   BMI 37.64 kg/m   No Known Allergies  Physical Exam  Constitutional: She is oriented to person, place, and time. She appears well-developed and well-nourished.  HENT:  Head: Normocephalic and atraumatic.  Eyes: Pupils are equal, round, and reactive to light. Conjunctivae and EOM are normal.  Cardiovascular: Normal rate, regular rhythm, normal heart sounds and intact distal  pulses.   Pulmonary/Chest: Effort normal and breath sounds normal.  Abdominal: Soft. Bowel sounds are normal.  Musculoskeletal:       Right shoulder: She exhibits decreased range of motion, tenderness, pain and spasm.       Lumbar back: She exhibits decreased range of motion, tenderness, pain and spasm.  Neurological: She is alert and oriented to person, place, and time. She has normal reflexes.  Skin: Skin is warm and dry. No rash noted.  Psychiatric: She has a normal mood and affect. Her behavior is normal. Judgment and thought content normal.        Assessment & Plan:   1. DDD (degenerative disc disease), lumbar - oxyCODONE-acetaminophen (PERCOCET) 10-325 MG tablet; Take 1 tablet by mouth 3 (three) times daily as needed for pain. For hip pain.  Dispense: 90 tablet; Refill: 0 - oxyCODONE-acetaminophen (PERCOCET) 10-325 MG tablet; Take 1 tablet by mouth every 8 (eight) hours as needed for pain.  Dispense: 90 tablet; Refill: 0 - oxyCODONE-acetaminophen (PERCOCET) 10-325 MG tablet; Take 1 tablet by mouth every 8 (eight) hours as needed for pain.  Dispense: 90 tablet; Refill: 0  2. Sciatica of right side   Current Outpatient Prescriptions:  .  aspirin EC 81 MG tablet, Take 81 mg by mouth daily., Disp: , Rfl:  .  desogestrel-ethinyl estradiol (KARIVA,AZURETTE,MIRCETTE) 0.15-0.02/0.01 MG (21/5) tablet, Take 1 tablet by mouth daily., Disp: 1 Package, Rfl: 12 .  lisinopril (PRINIVIL,ZESTRIL) 20 MG tablet, Take 1 tablet (20 mg total) by mouth daily., Disp: 30 tablet, Rfl: 11 .  meloxicam (MOBIC) 7.5 MG tablet, Take 1 tablet (7.5 mg total) by mouth daily., Disp: 30 tablet, Rfl: 11 .  oxyCODONE-acetaminophen (PERCOCET) 10-325 MG tablet, Take 1 tablet by mouth 3 (three) times daily as needed for pain. For hip pain., Disp: 90 tablet, Rfl: 0 .  oxyCODONE-acetaminophen (PERCOCET) 10-325 MG tablet, Take 1 tablet by mouth every 8 (eight) hours as needed for pain., Disp: 90 tablet, Rfl: 0 .   oxyCODONE-acetaminophen (PERCOCET) 10-325 MG tablet, Take 1 tablet by mouth every 8 (eight) hours as needed for pain., Disp: 90 tablet, Rfl: 0 .  polyethylene glycol (MIRALAX / GLYCOLAX) packet, Take 17 g by mouth daily as needed for mild constipation or moderate constipation. , Disp: , Rfl:  .  simvastatin (ZOCOR) 20 MG tablet, Take 1 tablet (20 mg total) by mouth every evening., Disp: 30 tablet, Rfl: 11  Continue all other maintenance medications as listed above.  Follow up plan: Return in about 3 months (around 04/05/2017) for recheck.  Educational handout given for survey   Remus Loffler PA-C [facility] [address]  [place], [place] [address] [phone]   01/03/2017, 3:59 PM

## 2017-01-06 ENCOUNTER — Telehealth: Payer: Self-pay | Admitting: Physician Assistant

## 2017-01-06 MED ORDER — MELOXICAM 15 MG PO TABS: 7.5000 mg | ORAL_TABLET | Freq: Every day | ORAL | 5 refills | Status: DC

## 2017-01-06 NOTE — Telephone Encounter (Signed)
Patient states that she is taking meloxicam 7.5mg . I advised patient that this was an antiinflammatory. She said did you just want her to continue that or were you going to increase or change?

## 2017-01-06 NOTE — Telephone Encounter (Signed)
Pt aware.

## 2017-01-06 NOTE — Telephone Encounter (Signed)
She is on mobic 7.5 mg , this is antiinflammatory, is she taking it?

## 2017-01-06 NOTE — Telephone Encounter (Signed)
Patient states that you all discussed a med to be sent in for inflammation? [facility]

## 2017-03-28 ENCOUNTER — Encounter (HOSPITAL_COMMUNITY): Payer: Self-pay | Admitting: Emergency Medicine

## 2017-03-28 ENCOUNTER — Emergency Department (HOSPITAL_COMMUNITY)
Admission: EM | Admit: 2017-03-28 | Discharge: 2017-03-28 | Disposition: A | Discharge status: Home / Self Care | Payer: PRIVATE HEALTH INSURANCE | Attending: Emergency Medicine | Admitting: Emergency Medicine

## 2017-03-28 DIAGNOSIS — I10 Essential (primary) hypertension: Secondary | ICD-10-CM | POA: Insufficient documentation

## 2017-03-28 DIAGNOSIS — Z7982 Long term (current) use of aspirin: Secondary | ICD-10-CM | POA: Insufficient documentation

## 2017-03-28 DIAGNOSIS — M5431 Sciatica, right side: Secondary | ICD-10-CM | POA: Insufficient documentation

## 2017-03-28 DIAGNOSIS — M5430 Sciatica, unspecified side: Secondary | ICD-10-CM

## 2017-03-28 DIAGNOSIS — Z87891 Personal history of nicotine dependence: Secondary | ICD-10-CM | POA: Insufficient documentation

## 2017-03-28 DIAGNOSIS — Z79899 Other long term (current) drug therapy: Secondary | ICD-10-CM | POA: Insufficient documentation

## 2017-03-28 MED ORDER — KETOROLAC TROMETHAMINE 30 MG/ML IJ SOLN
30.0000 mg | Freq: Once | INTRAMUSCULAR | Status: AC
  Administered 2017-03-28: 30 mg via INTRAMUSCULAR
  Filled 2017-03-28: qty 1

## 2017-03-28 MED ORDER — DEXAMETHASONE SODIUM PHOSPHATE 4 MG/ML IJ SOLN
10.0000 mg | Freq: Once | INTRAMUSCULAR | Status: AC
  Administered 2017-03-28: 10 mg via INTRAMUSCULAR
  Filled 2017-03-28: qty 3

## 2017-03-28 NOTE — Discharge Instructions (Signed)
See your doctor as requested.

## 2017-03-28 NOTE — ED Notes (Signed)
Pt driving herself home today, has no one else to pick her up. Refused narcotic d/t this.

## 2017-03-28 NOTE — ED Triage Notes (Signed)
Chronic hip pain, today pain is worse, new burning pain, some radiating pain down leg, pt walks a lot at work

## 2017-03-28 NOTE — ED Provider Notes (Signed)
[facility]PT Provider Note   CSN: [id number] Arrival date & time: 03/28/17  1217     History   Chief Complaint Chief Complaint  Patient presents with  . Hip Pain    HPI Sandra Carroll is a 51 y.o. female.  HPI Patient comes in with chief complaint of back pain. Patient has history of chronic back pain managed by her primary care doctor with Percocets. Patient reports that last week while at work she might have strained her back and since then she has been having right-sided hip pain that is radiating towards her knee. Patient has never had pain radiation to her knee. Pt has no associated numbness, weakness, urinary incontinence, urinary retention, bowel incontinence, pins and needle sensation in the perineal area.  Past Medical History:  Diagnosis Date  . Chronic back pain   . Complication of anesthesia 1991 and 1998   childbirth --reaction to anesthesia was itching.   . DDD (degenerative disc disease)   . Hemorrhoids   . Hyperlipidemia   . Hypertension   . PONV (postoperative nausea and vomiting)   . Shortness of breath    quit smoking two years ago and still has some sob with exertion     Patient Active Problem List   Diagnosis Date Noted  . Sciatica of right side 01/03/2017  . DDD (degenerative disc disease), lumbar 10/04/2016  . Essential hypertension 10/04/2016  . Pure hypercholesterolemia 10/04/2016  . DUB (dysfunctional uterine bleeding) 10/04/2016  . Internal hemorrhoids 07/25/2014  . Upper abdominal pain 07/25/2014  . Nausea and vomiting 07/25/2014  . Rash 07/25/2014  . Enteritis 07/25/2014  . Internal hemorrhoids with other complication 06/25/2013  . Rectal bleeding 02/16/2013  . Constipation 02/16/2013    Past Surgical History:  Procedure Laterality Date  . ADENOIDECTOMY  1978  . CESAREAN SECTION  1991 1998   pt had itching with 1991 c-section.   . COLONOSCOPY N/A 03/01/2013   Dr. Jena Gauss- normal rectum except for anal canal/internal  hemorrhoidal tags. colonic mucosa appeared normal.  . KNEE ARTHROSCOPY  2003  . LUMBAR LAMINECTOMY/DECOMPRESSION MICRODISCECTOMY  03/02/2012   Procedure: LUMBAR LAMINECTOMY/DECOMPRESSION MICRODISCECTOMY 1 LEVEL;  Surgeon: Carmela Hurt, MD;  Location: [facility];  Service: Neurosurgery;  Laterality: Left;  LEFT Lumbar Five-Sacral One Laminectomy for Decompression     OB History    No data available       Home Medications    Prior to Admission medications   Medication Sig Start Date End Date Taking? Authorizing Provider  aspirin EC 81 MG tablet Take 81 mg by mouth daily.   Yes [provider]  Aspirin-Caffeine (BACK & BODY EXTRA STRENGTH) 500-32.5 MG TABS Take 2 tablets by mouth daily as needed (pain).   Yes [provider]  desogestrel-ethinyl estradiol (KARIVA,AZURETTE,MIRCETTE) 0.15-0.02/0.01 MG (21/5) tablet Take 1 tablet by mouth daily. 10/04/16  Yes Remus Loffler, PA-C  lisinopril (PRINIVIL,ZESTRIL) 20 MG tablet Take 1 tablet (20 mg total) by mouth daily. 10/04/16  Yes Remus Loffler, PA-C  meloxicam (MOBIC) 15 MG tablet Take 0.5 tablets (7.5 mg total) by mouth daily. Patient taking differently: Take 15 mg by mouth daily.  01/06/17  Yes Remus Loffler, PA-C  oxyCODONE-acetaminophen (PERCOCET) 10-325 MG tablet Take 1 tablet by mouth every 8 (eight) hours as needed for pain. 01/03/17  Yes Remus Loffler, PA-C  polyethylene glycol (MIRALAX / GLYCOLAX) packet Take 17 g by mouth daily as needed for mild constipation or moderate constipation.  Yes [provider]  simvastatin (ZOCOR) 20 MG tablet Take 1 tablet (20 mg total) by mouth every evening. 10/04/16  Yes Remus Loffler, PA-C    Family History Family History  Problem Relation Age of Onset  . Hypertension Mother   . Cancer Father   . Hypertension Father   . Colon cancer Neg Hx     Social History Social History  Substance Use Topics  . Smoking status: Former Smoker    Quit date: 2010  . Smokeless  tobacco: Never Used  . Alcohol use No     Allergies   Patient has no known allergies.   Review of Systems Review of Systems  Constitutional: Positive for activity change.  Gastrointestinal: Negative for abdominal pain.  Musculoskeletal: Positive for back pain.  Neurological: Positive for numbness.     Physical Exam Updated Vital Signs BP (!) 149/92 (BP Location: Right Arm)   Pulse 67   Temp 98.2 F (36.8 C) (Oral)   Resp 19   Ht  (1.575 m)   Wt 93 kg (205 lb)   LMP 03/13/2017   SpO2 98%   BMI 37.49 kg/m   Physical Exam  Constitutional: She is oriented to person, place, and time. She appears well-developed.  HENT:  Head: Normocephalic and atraumatic.  Eyes: EOM are normal.  Neck: Normal range of motion. Neck supple.  Cardiovascular: Normal rate.   Pulmonary/Chest: Effort normal.  Abdominal: Bowel sounds are normal.  Musculoskeletal:  Pt has tenderness over the lumbar region and the R hip No step offs, no erythema. Pt has 1+ patellar reflex bilaterally. Able to discriminate between sharp and dull.    Neurological: She is alert and oriented to person, place, and time.  Skin: Skin is warm and dry.  Nursing note and vitals reviewed.    ED Treatments / Results  Labs (all labs ordered are listed, but only abnormal results are displayed) Labs Reviewed - No data to display  EKG  EKG Interpretation None       Radiology No results found.  Procedures Procedures (including critical care time)  Medications Ordered in ED Medications  dexamethasone (DECADRON) injection 10 mg (not administered)  ketorolac (TORADOL) 30 MG/ML injection 30 mg (not administered)     Initial Impression / Assessment and Plan / ED Course  I have reviewed the triage vital signs and the nursing notes.  Pertinent labs & imaging results that were available during my care of the patient were reviewed by me and considered in my medical decision making (see chart for  details).    Patient comes in with chief complaint of right hip pain. Patient has history of degenerative disc disease of her back. It appears that she might have flared up her injury last week while at work. Patient is having nerve impingement syndrome-like syndrome. Passive leg raise test did not reproduce the symptoms - but sciatica could be the underlying etiology. Patient has no focal spine tenderness and no red flags to be concerned about spinal cord compression. Patient is already on narcotic pain meds. We will give her a Toradol and Decadron IM dose, and advise conservative measures with exercise and PCP follow-up.  Final Clinical Impressions(s) / ED Diagnoses   Final diagnoses:  Acute sciatica    New Prescriptions New Prescriptions   No medications on file     Derwood Kaplan, MD 03/28/17 1432

## 2017-04-07 ENCOUNTER — Encounter: Payer: Self-pay | Admitting: Physician Assistant

## 2017-04-07 ENCOUNTER — Ambulatory Visit (INDEPENDENT_AMBULATORY_CARE_PROVIDER_SITE_OTHER): Payer: Self-pay | Admitting: Physician Assistant

## 2017-04-07 VITALS — BP 113/76 | HR 62 | Temp 96.9°F | Ht 62.0 in | Wt 208.0 lb

## 2017-04-07 DIAGNOSIS — M5136 Other intervertebral disc degeneration, lumbar region: Secondary | ICD-10-CM

## 2017-04-07 DIAGNOSIS — M5431 Sciatica, right side: Secondary | ICD-10-CM

## 2017-04-07 MED ORDER — OXYCODONE-ACETAMINOPHEN 10-325 MG PO TABS: 1.0000 | ORAL_TABLET | Freq: Three times a day (TID) | ORAL | 0 refills | Status: DC | PRN

## 2017-04-07 MED ORDER — METHYLPREDNISOLONE ACETATE 80 MG/ML IJ SUSP: 80.0000 mg | Freq: Once | INTRAMUSCULAR | Status: DC

## 2017-04-07 MED ORDER — CYCLOBENZAPRINE HCL 10 MG PO TABS: 10.0000 mg | ORAL_TABLET | Freq: Three times a day (TID) | ORAL | 5 refills | Status: DC | PRN

## 2017-04-07 MED ORDER — FLUTICASONE PROPIONATE 50 MCG/ACT NA SUSP: 2.0000 | Freq: Every day | NASAL | 6 refills | Status: DC

## 2017-04-07 MED ORDER — OXYCODONE-ACETAMINOPHEN 10-325 MG PO TABS
1.0000 | ORAL_TABLET | Freq: Three times a day (TID) | ORAL | 0 refills | Status: DC | PRN
Start: 2017-04-07 — End: 2017-07-14

## 2017-04-07 NOTE — Progress Notes (Signed)
BP 113/76   Pulse 62   Temp (!) 96.9 F (36.1 C) (Oral)   Ht 5\' 2"  (1.575 m)   Wt 208 lb (94.3 kg)   LMP 03/13/2017   BMI 38.04 kg/m    Subjective:    Patient ID: Sandra Carroll, female    DOB: 1965/08/16, 51 y.o.   MRN: [id number]  HPI: Sandra Carroll is a 51 y.o. female presenting on 04/07/2017 for Follow-up (3 month )  This patient comes in for periodic recheck on medications and conditions including DDD and sciatica right side.  Has not had any improvement in her back and at times the right side will really flare up.  .She is seeing chiropractor for her back right now, without much change yet.  All medications are reviewed today. There are no reports of any problems with the medications. All of the medical conditions are reviewed and updated.  Lab work is reviewed and will be ordered as medically necessary. There are no new problems reported with today's visit.   Relevant past medical, surgical, family and social history reviewed and updated as indicated. Allergies and medications reviewed and updated.  Past Medical History:  Diagnosis Date  . Chronic back pain   . Complication of anesthesia 1991 and 1998   childbirth --reaction to anesthesia was itching.   . DDD (degenerative disc disease)   . Hemorrhoids   . Hyperlipidemia   . Hypertension   . PONV (postoperative nausea and vomiting)   . Shortness of breath    quit smoking two years ago and still has some sob with exertion     Past Surgical History:  Procedure Laterality Date  . ADENOIDECTOMY  1978  . CESAREAN SECTION  1991 1998   pt had itching with 1991 c-section.   . COLONOSCOPY N/A 03/01/2013   Dr. Jena Gauss- normal rectum except for anal canal/internal hemorrhoidal tags. colonic mucosa appeared normal.  . KNEE ARTHROSCOPY  2003  . LUMBAR LAMINECTOMY/DECOMPRESSION MICRODISCECTOMY  03/02/2012   Procedure: LUMBAR LAMINECTOMY/DECOMPRESSION MICRODISCECTOMY 1 LEVEL;  Surgeon: Carmela Hurt, MD;  Location: [facility];  Service: Neurosurgery;  Laterality: Left;  LEFT Lumbar Five-Sacral One Laminectomy for Decompression     Review of Systems  Constitutional: Negative.  Negative for activity change, fatigue and fever.  HENT: Negative.   Eyes: Negative.   Respiratory: Negative.  Negative for cough.   Cardiovascular: Negative.  Negative for chest pain.  Gastrointestinal: Negative.  Negative for abdominal pain.  Endocrine: Negative.   Genitourinary: Negative.  Negative for dysuria.  Musculoskeletal: Positive for arthralgias, myalgias and neck pain.  Skin: Negative.   Neurological: Negative.     Allergies as of 04/07/2017   No Known Allergies     Medication List       Accurate as of 04/07/17  1:53 PM. Always use your most recent med list.          aspirin EC 81 MG tablet Take 81 mg by mouth daily.   BACK & BODY EXTRA STRENGTH 500-32.5 MG Tabs Generic drug:  Aspirin-Caffeine Take 2 tablets by mouth daily as needed (pain).   cyclobenzaprine 10 MG tablet Commonly known as:  FLEXERIL Take 1 tablet (10 mg total) by mouth 3 (three) times daily as needed for muscle spasms.   desogestrel-ethinyl estradiol 0.15-0.02/0.01 MG (21/5) tablet Commonly known as:  KARIVA,AZURETTE,MIRCETTE Take 1 tablet by mouth daily.   fluticasone 50 MCG/ACT nasal spray Commonly known as:  FLONASE Place 2 sprays into both nostrils  daily.   lisinopril 20 MG tablet Commonly known as:  PRINIVIL,ZESTRIL Take 1 tablet (20 mg total) by mouth daily.   meloxicam 15 MG tablet Commonly known as:  MOBIC Take 0.5 tablets (7.5 mg total) by mouth daily.   oxyCODONE-acetaminophen 10-325 MG tablet Commonly known as:  PERCOCET Take 1 tablet by mouth every 8 (eight) hours as needed for pain.   oxyCODONE-acetaminophen 10-325 MG tablet Commonly known as:  PERCOCET Take 1 tablet by mouth every 8 (eight) hours as needed for pain.   oxyCODONE-acetaminophen 10-325 MG tablet Commonly known as:  PERCOCET Take 1 tablet by  mouth every 8 (eight) hours as needed for pain.   polyethylene glycol packet Commonly known as:  MIRALAX / GLYCOLAX Take 17 g by mouth daily as needed for mild constipation or moderate constipation.   simvastatin 20 MG tablet Commonly known as:  ZOCOR Take 1 tablet (20 mg total) by mouth every evening.          Objective:    BP 113/76   Pulse 62   Temp (!) 96.9 F (36.1 C) (Oral)   Ht 5\' 2"  (1.575 m)   Wt 208 lb (94.3 kg)   LMP 03/13/2017   BMI 38.04 kg/m   No Known Allergies  Physical Exam  Constitutional: She is oriented to person, place, and time. She appears well-developed and well-nourished.  HENT:  Head: Normocephalic and atraumatic.  Eyes: Pupils are equal, round, and reactive to light. Conjunctivae and EOM are normal.  Cardiovascular: Normal rate, regular rhythm, normal heart sounds and intact distal pulses.   Pulmonary/Chest: Effort normal and breath sounds normal.  Abdominal: Soft. Bowel sounds are normal.  Musculoskeletal:       Lumbar back: She exhibits decreased range of motion, pain and spasm. She exhibits no edema.       Back:  Neurological: She is alert and oriented to person, place, and time. She has normal reflexes.  Skin: Skin is warm and dry. No rash noted.  Psychiatric: She has a normal mood and affect. Her behavior is normal. Judgment and thought content normal.  Nursing note and vitals reviewed.       Assessment & Plan:   1. DDD (degenerative disc disease), lumbar - oxyCODONE-acetaminophen (PERCOCET) 10-325 MG tablet; Take 1 tablet by mouth every 8 (eight) hours as needed for pain.  Dispense: 90 tablet; Refill: 0 - oxyCODONE-acetaminophen (PERCOCET) 10-325 MG tablet; Take 1 tablet by mouth every 8 (eight) hours as needed for pain.  Dispense: 90 tablet; Refill: 0 - oxyCODONE-acetaminophen (PERCOCET) 10-325 MG tablet; Take 1 tablet by mouth every 8 (eight) hours as needed for pain.  Dispense: 90 tablet; Refill: 0  2. Sciatica of right  side    Current Outpatient Prescriptions:  .  aspirin EC 81 MG tablet, Take 81 mg by mouth daily., Disp: , Rfl:  .  Aspirin-Caffeine (BACK & BODY EXTRA STRENGTH) 500-32.5 MG TABS, Take 2 tablets by mouth daily as needed (pain)., Disp: , Rfl:  .  desogestrel-ethinyl estradiol (KARIVA,AZURETTE,MIRCETTE) 0.15-0.02/0.01 MG (21/5) tablet, Take 1 tablet by mouth daily., Disp: 1 Package, Rfl: 12 .  lisinopril (PRINIVIL,ZESTRIL) 20 MG tablet, Take 1 tablet (20 mg total) by mouth daily., Disp: 30 tablet, Rfl: 11 .  meloxicam (MOBIC) 15 MG tablet, Take 0.5 tablets (7.5 mg total) by mouth daily. (Patient taking differently: Take 15 mg by mouth daily. ), Disp: 30 tablet, Rfl: 5 .  oxyCODONE-acetaminophen (PERCOCET) 10-325 MG tablet, Take 1 tablet by mouth every 8 (eight)  hours as needed for pain., Disp: 90 tablet, Rfl: 0 .  polyethylene glycol (MIRALAX / GLYCOLAX) packet, Take 17 g by mouth daily as needed for mild constipation or moderate constipation. , Disp: , Rfl:  .  simvastatin (ZOCOR) 20 MG tablet, Take 1 tablet (20 mg total) by mouth every evening., Disp: 30 tablet, Rfl: 11 .  cyclobenzaprine (FLEXERIL) 10 MG tablet, Take 1 tablet (10 mg total) by mouth 3 (three) times daily as needed for muscle spasms., Disp: 90 tablet, Rfl: 5 .  fluticasone (FLONASE) 50 MCG/ACT nasal spray, Place 2 sprays into both nostrils daily., Disp: 16 g, Rfl: 6 .  oxyCODONE-acetaminophen (PERCOCET) 10-325 MG tablet, Take 1 tablet by mouth every 8 (eight) hours as needed for pain., Disp: 90 tablet, Rfl: 0 .  oxyCODONE-acetaminophen (PERCOCET) 10-325 MG tablet, Take 1 tablet by mouth every 8 (eight) hours as needed for pain., Disp: 90 tablet, Rfl: 0 Continue all other maintenance medications as listed above.  Follow up plan: Return in about 3 months (around 07/08/2017) for recheck.  Educational handout given for survey  Remus LofflerAngel S. Freeland Pracht PA-C [facility] [address]  [place], [place]  [id number] [phone]   04/07/2017, 1:53 PM

## 2017-04-07 NOTE — Patient Instructions (Signed)
In a few days you may receive a survey in the mail or online from [employer] regarding your visit with us today. Please take a moment to fill this out. Your feedback is very important to our whole office. It can help us better understand your needs as well as improve your experience and satisfaction. Thank you for taking your time to complete it. We care about you.  Caton Popowski, PA-C  

## 2017-07-14 ENCOUNTER — Ambulatory Visit (INDEPENDENT_AMBULATORY_CARE_PROVIDER_SITE_OTHER): Payer: Self-pay | Admitting: Physician Assistant

## 2017-07-14 ENCOUNTER — Encounter: Payer: Self-pay | Admitting: Physician Assistant

## 2017-07-14 VITALS — BP 129/84 | HR 91 | Temp 97.2°F | Ht 62.0 in | Wt 206.6 lb

## 2017-07-14 DIAGNOSIS — I10 Essential (primary) hypertension: Secondary | ICD-10-CM

## 2017-07-14 DIAGNOSIS — M7071 Other bursitis of hip, right hip: Secondary | ICD-10-CM

## 2017-07-14 DIAGNOSIS — M5136 Other intervertebral disc degeneration, lumbar region: Secondary | ICD-10-CM

## 2017-07-14 MED ORDER — OXYCODONE-ACETAMINOPHEN 10-325 MG PO TABS: 1.0000 | ORAL_TABLET | Freq: Three times a day (TID) | ORAL | 0 refills | Status: DC | PRN

## 2017-07-14 MED ORDER — PREDNISONE 10 MG (48) PO TBPK: ORAL_TABLET | ORAL | 0 refills | Status: DC

## 2017-07-14 NOTE — Patient Instructions (Addendum)
Hip Exercises Ask your health care provider which exercises are safe for you. Do exercises exactly as told by your health care provider and adjust them as directed. It is normal to feel mild stretching, pulling, tightness, or discomfort as you do these exercises, but you should stop right away if you feel sudden pain or your pain gets worse.Do not begin these exercises until told by your health care provider. STRETCHING AND RANGE OF MOTION EXERCISES These exercises warm up your muscles and joints and improve the movement and flexibility of your hip. These exercises also help to relieve pain, numbness, and tingling. Exercise A: Hamstrings, Supine  1. Lie on your back. 2. Loop a belt or towel over the ball of your left / rightfoot. The ball of your foot is on the walking surface, right under your toes. 3. Straighten your left / rightknee and slowly pull on the belt to raise your leg. ? Do not let your left / right knee bend while you do this. ? Keep your other leg flat on the floor. ? Raise the left / right leg until you feel a gentle stretch behind your left / right knee or thigh. 4. Hold this position for __________ seconds. 5. Slowly return your leg to the starting position. Repeat __________ times. Complete this stretch __________ times a day. Exercise B: Hip Rotators  1. Lie on your back on a firm surface. 2. Hold your left / right knee with your left / right hand. Hold your ankle with your other hand. 3. Gently pull your left / right knee and rotate your lower leg toward your other shoulder. ? Pull until you feel a stretch in your buttocks. ? Keep your hips and shoulders firmly planted while you do this stretch. 4. Hold this position for __________ seconds. Repeat __________ times. Complete this stretch __________ times a day. Exercise C: V-Sit (Hamstrings and Adductors)  1. Sit on the floor with your legs extended in a large "V" shape. Keep your knees straight during this  exercise. 2. Start with your head and chest upright, then bend at your waist to reach for your left foot (position A). You should feel a stretch in your right inner thigh. 3. Hold this position for __________ seconds. Then slowly return to the upright position. 4. Bend at your waist to reach forward (position B). You should feel a stretch behind both of your thighs and knees. 5. Hold this position for __________ seconds. Then slowly return to the upright position. 6. Bend at your waist to reach for your right foot (position C). You should feel a stretch in your left inner thigh. 7. Hold this position for __________ seconds. Then slowly return to the upright position. Repeat __________ times. Complete this stretch __________ times a day. Exercise D: Lunge (Hip Flexors)  1. Place your left / right knee on the floor and bend your other knee so that is directly over your ankle. You should be half-kneeling. 2. Keep good posture with your head over your shoulders. 3. Tighten your buttocks to point your tailbone downward. This helps your back to keep from arching too much. 4. You should feel a gentle stretch in the front of your left / right thigh and hip. If you do not feel any resistance, slightly slide your other foot forward and then slowly lunge forward so your knee once again lines up over your ankle. 5. Make sure your tailbone continues to point downward. 6. Hold this position for __________ seconds. Repeat   __________ times. Complete this stretch __________ times a day. STRENGTHENING EXERCISES These exercises build strength and endurance in your hip. Endurance is the ability to use your muscles for a long time, even after they get tired. Exercise E: Bridge (Hip Extensors)  1. Lie on your back on a firm surface with your knees bent and your feet flat on the floor. 2. Tighten your buttocks muscles and lift your bottom off the floor until the trunk of your body is level with your thighs. ? Do not  arch your back. ? You should feel the muscles working in your buttocks and the back of your thighs. If you do not feel these muscles, slide your feet 1-2 inches (2.5-5 cm) farther away from your buttocks. 3. Hold this position for __________ seconds. 4. Slowly lower your hips to the starting position. 5. Let your muscles relax completely between repetitions. 6. If this exercise is too easy, try doing it with your arms crossed over your chest. Repeat __________ times. Complete this exercise __________ times a day. Exercise F: Straight Leg Raises - Hip Abductors  1. Lie on your side with your left / right leg in the top position. Lie so your head, shoulder, knee, and hip line up with each other. You may bend your bottom knee to help you balance. 2. Roll your hips slightly forward, so your hips are stacked directly over each other and your left / right knee is facing forward. 3. Leading with your heel, lift your top leg 4-6 inches (10-15 cm). You should feel the muscles in your outer hip lifting. ? Do not let your foot drift forward. ? Do not let your knee roll toward the ceiling. 4. Hold this position for __________ seconds. 5. Slowly return to the starting position. 6. Let your muscles relax completely between repetitions. Repeat __________ times. Complete this exercise __________ times a day. Exercise G: Straight Leg Raises - Hip Adductors  1. Lie on your side with your left / right leg in the bottom position. Lie so your head, shoulder, knee, and hip line up. You may place your upper foot in front to help you balance. 2. Roll your hips slightly forward, so your hips are stacked directly over each other and your left / right knee is facing forward. 3. Tense the muscles in your inner thigh and lift your bottom leg 4-6 inches (10-15 cm). 4. Hold this position for __________ seconds. 5. Slowly return to the starting position. 6. Let your muscles relax completely between repetitions. Repeat  __________ times. Complete this exercise __________ times a day. Exercise H: Straight Leg Raises - Quadriceps  1. Lie on your back with your left / right leg extended and your other knee bent. 2. Tense the muscles in the front of your left / right thigh. When you do this, you should see your kneecap slide up or see increased dimpling just above your knee. 3. Tighten these muscles even more and raise your leg 4-6 inches (10-15 cm) off the floor. 4. Hold this position for __________ seconds. 5. Keep these muscles tense as you lower your leg. 6. Relax the muscles slowly and completely between repetitions. Repeat __________ times. Complete this exercise __________ times a day. Exercise I: Hip Abductors, Standing 1. Tie one end of a rubber exercise band or tubing to a secure surface, such as a table or pole. 2. Loop the other end of the band or tubing around your left / right ankle. 3. Keeping your ankle with   the band or tubing directly opposite of the secured end, step away until there is tension in the tubing or band. Hold onto a chair as needed for balance. 4. Lift your left / right leg out to your side. While you do this: ? Keep your back upright. ? Keep your shoulders over your hips. ? Keep your toes pointing forward. ? Make sure to use your hip muscles to lift your leg. Do not "throw" your leg or tip your body to lift your leg. 5. Hold this position for __________ seconds. 6. Slowly return to the starting position. Repeat __________ times. Complete this exercise __________ times a day. Exercise J: Squats (Quadriceps) 1. Stand in a door frame so your feet and knees are in line with the frame. You may place your hands on the frame for balance. 2. Slowly bend your knees and lower your hips like you are going to sit in a chair. ? Keep your lower legs in a straight-up-and-down position. ? Do not let your hips go lower than your knees. ? Do not bend your knees lower than told by your health  care provider. ? If your hip pain increases, do not bend as low. 3. Hold this position for ___________ seconds. 4. Slowly push with your legs to return to standing. Do not use your hands to pull yourself to standing. Repeat __________ times. Complete this exercise __________ times a day. This information is not intended to replace advice given to you by your health care provider. Make sure you discuss any questions you have with your health care provider. Document Released: 06/24/2005 Document Revised: 02/29/2016 Document Reviewed: 06/01/2015 Elsevier Interactive Patient Education  2018 [employer] Hip Bursitis Hip bursitis is swelling of a fluid-filled sac (bursa) in your hip. This swelling (inflammation) can be painful. This condition may come and go over time. Follow these instructions at home: Medicines  Take over-the-counter and prescription medicines only as told by your doctor.  Do not drive or use heavy machinery while taking prescription pain medicine, or as told by your doctor.  If you were prescribed an antibiotic medicine, take it as told by your doctor. Do not stop taking the antibiotic even if you start to feel better. Activity  Return to your normal activities as told by your doctor. Ask your doctor what activities are safe for you.  Rest and protect your hip until you feel better. General instructions  Wear wraps that put pressure on your hip (compression wraps) only as told by your doctor.  Raise (elevate) your hip above the level of your heart as much as you can. To do this, try putting a pillow under your hips while you lie down. Stop if this causes pain.  Do not use your hip to support your body weight until your doctor says that you can.  Use crutches as told by your doctor.  Gently rub and stretch your injured area as often as is comfortable.  Keep all follow-up visits as told by your doctor. This is important. How is this prevented?  Exercise regularly,  as told by your doctor.  Warm up and stretch before being active.  Cool down and stretch after being active.  Avoid activities that bother your hip or cause pain.  Avoid sitting down for long periods at a time. Contact a doctor if:  You have a fever.  You get new symptoms.  You have trouble walking.  You have trouble doing everyday activities.  You have pain that gets  worse.  You have pain that does not get better with medicine.  You get red skin on your hip area.  You get a feeling of warmth in your hip area. Get help right away if:  You cannot move your hip.  You have very bad pain. This information is not intended to replace advice given to you by your health care provider. Make sure you discuss any questions you have with your health care provider. Document Released: 07/09/2010 Document Revised: 11/12/2015 Document Reviewed: 01/06/2015 Elsevier Interactive Patient Education  [employer].

## 2017-07-17 DIAGNOSIS — M7071 Other bursitis of hip, right hip: Secondary | ICD-10-CM | POA: Insufficient documentation

## 2017-07-17 NOTE — Progress Notes (Signed)
BP 129/84   Pulse 91   Temp (!) 97.2 F (36.2 C) (Oral)   Ht 5\' 2"  (1.575 m)   Wt 206 lb 9.6 oz (93.7 kg)   BMI 37.79 kg/m    Subjective:    Patient ID: Sandra Carroll, female    DOB: 11/04/1965, 52 y.o.   MRN: [id number]  HPI: Sandra Carroll is a 52 y.o. female presenting on 07/14/2017 for Follow-up (3 month )  This patient comes in for periodic recheck on medications and conditions including and hypertension.  All of her indications are reviewed today she is having the most problem out of her right hip particularly when she lays on it.  She is not sleeping very well at night because of it..   All medications are reviewed today. There are no reports of any problems with the medications. All of the medical conditions are reviewed and updated.  Lab work is reviewed and will be ordered as medically necessary. There are no new problems reported with today's visit.   Relevant past medical, surgical, family and social history reviewed and updated as indicated. Allergies and medications reviewed and updated.  Past Medical History:  Diagnosis Date  . Chronic back pain   . Complication of anesthesia 1991 and 1998   childbirth --reaction to anesthesia was itching.   . DDD (degenerative disc disease)   . Hemorrhoids   . Hyperlipidemia   . Hypertension   . PONV (postoperative nausea and vomiting)   . Shortness of breath    quit smoking two years ago and still has some sob with exertion     Past Surgical History:  Procedure Laterality Date  . ADENOIDECTOMY  1978  . CESAREAN SECTION  1991 1998   pt had itching with 1991 c-section.   . COLONOSCOPY N/A 03/01/2013   Dr. Jena Gaussourk- normal rectum except for anal canal/internal hemorrhoidal tags. colonic mucosa appeared normal.  . KNEE ARTHROSCOPY  2003  . LUMBAR LAMINECTOMY/DECOMPRESSION MICRODISCECTOMY  03/02/2012   Procedure: LUMBAR LAMINECTOMY/DECOMPRESSION MICRODISCECTOMY 1 LEVEL;  Surgeon: Carmela HurtKyle L Cabbell, MD;  Location: [facility];   Service: Neurosurgery;  Laterality: Left;  LEFT Lumbar Five-Sacral One Laminectomy for Decompression     Review of Systems  Constitutional: Negative.   HENT: Negative.   Eyes: Negative.   Respiratory: Negative.   Gastrointestinal: Negative.   Genitourinary: Negative.   Musculoskeletal: Positive for arthralgias, back pain and myalgias.    Allergies as of 07/14/2017   No Known Allergies     Medication List        Accurate as of 07/14/17 11:59 PM. Always use your most recent med list.          aspirin EC 81 MG tablet Take 81 mg by mouth daily.   cyclobenzaprine 10 MG tablet Commonly known as:  FLEXERIL Take 1 tablet (10 mg total) by mouth 3 (three) times daily as needed for muscle spasms.   desogestrel-ethinyl estradiol 0.15-0.02/0.01 MG (21/5) tablet Commonly known as:  KARIVA,AZURETTE,MIRCETTE Take 1 tablet by mouth daily.   ibuprofen 800 MG tablet Commonly known as:  ADVIL,MOTRIN Take by mouth.   lisinopril 20 MG tablet Commonly known as:  PRINIVIL,ZESTRIL Take 1 tablet (20 mg total) by mouth daily.   oxyCODONE-acetaminophen 10-325 MG tablet Commonly known as:  PERCOCET Take 1 tablet by mouth every 8 (eight) hours as needed for pain.   oxyCODONE-acetaminophen 10-325 MG tablet Commonly known as:  PERCOCET Take 1 tablet by mouth every 8 (eight) hours as  needed for pain.   oxyCODONE-acetaminophen 10-325 MG tablet Commonly known as:  PERCOCET Take 1 tablet by mouth every 8 (eight) hours as needed for pain.   polyethylene glycol packet Commonly known as:  MIRALAX / GLYCOLAX Take 17 g by mouth daily as needed for mild constipation or moderate constipation.   predniSONE 10 MG (48) Tbpk tablet Commonly known as:  STERAPRED UNI-PAK 48 TAB Take 12 days as directed   simvastatin 20 MG tablet Commonly known as:  ZOCOR Take 1 tablet (20 mg total) by mouth every evening.          Objective:    BP 129/84   Pulse 91   Temp (!) 97.2 F (36.2 C) (Oral)   Ht  5\' 2"  (1.575 m)   Wt 206 lb 9.6 oz (93.7 kg)   BMI 37.79 kg/m   No Known Allergies  Physical Exam  Constitutional: She is oriented to person, place, and time. She appears well-developed and well-nourished.  HENT:  Head: Normocephalic and atraumatic.  Eyes: Conjunctivae and EOM are normal. Pupils are equal, round, and reactive to light.  Cardiovascular: Normal rate, regular rhythm, normal heart sounds and intact distal pulses.  Pulmonary/Chest: Effort normal and breath sounds normal.  Abdominal: Soft. Bowel sounds are normal.  Musculoskeletal:       Lumbar back: She exhibits decreased range of motion, tenderness, pain and spasm.  Neurological: She is alert and oriented to person, place, and time. She has normal reflexes.  Skin: Skin is warm and dry. No rash noted.  Psychiatric: She has a normal mood and affect. Her behavior is normal. Judgment and thought content normal.  Nursing note and vitals reviewed.       Assessment & Plan:   1. DDD (degenerative disc disease), lumbar - ibuprofen (ADVIL,MOTRIN) 800 MG tablet; Take by mouth. - oxyCODONE-acetaminophen (PERCOCET) 10-325 MG tablet; Take 1 tablet by mouth every 8 (eight) hours as needed for pain.  Dispense: 90 tablet; Refill: 0 - oxyCODONE-acetaminophen (PERCOCET) 10-325 MG tablet; Take 1 tablet by mouth every 8 (eight) hours as needed for pain.  Dispense: 90 tablet; Refill: 0 - oxyCODONE-acetaminophen (PERCOCET) 10-325 MG tablet; Take 1 tablet by mouth every 8 (eight) hours as needed for pain.  Dispense: 90 tablet; Refill: 0  2. Bursitis of right hip, unspecified bursa - ibuprofen (ADVIL,MOTRIN) 800 MG tablet; Take by mouth. - predniSONE (STERAPRED UNI-PAK 48 TAB) 10 MG (48) TBPK tablet; Take 12 days as directed  Dispense: 48 tablet; Refill: 0 - DG HIP UNILAT W OR W/O PELVIS 2-3 VIEWS LEFT; Future  3. Essential hypertension    Current Outpatient Medications:  .  aspirin EC 81 MG tablet, Take 81 mg by mouth daily., Disp: ,  Rfl:  .  cyclobenzaprine (FLEXERIL) 10 MG tablet, Take 1 tablet (10 mg total) by mouth 3 (three) times daily as needed for muscle spasms., Disp: 90 tablet, Rfl: 5 .  desogestrel-ethinyl estradiol (KARIVA,AZURETTE,MIRCETTE) 0.15-0.02/0.01 MG (21/5) tablet, Take 1 tablet by mouth daily., Disp: 1 Package, Rfl: 12 .  ibuprofen (ADVIL,MOTRIN) 800 MG tablet, Take by mouth., Disp: , Rfl:  .  lisinopril (PRINIVIL,ZESTRIL) 20 MG tablet, Take 1 tablet (20 mg total) by mouth daily., Disp: 30 tablet, Rfl: 11 .  oxyCODONE-acetaminophen (PERCOCET) 10-325 MG tablet, Take 1 tablet by mouth every 8 (eight) hours as needed for pain., Disp: 90 tablet, Rfl: 0 .  oxyCODONE-acetaminophen (PERCOCET) 10-325 MG tablet, Take 1 tablet by mouth every 8 (eight) hours as needed for pain., Disp: 90  tablet, Rfl: 0 .  oxyCODONE-acetaminophen (PERCOCET) 10-325 MG tablet, Take 1 tablet by mouth every 8 (eight) hours as needed for pain., Disp: 90 tablet, Rfl: 0 .  polyethylene glycol (MIRALAX / GLYCOLAX) packet, Take 17 g by mouth daily as needed for mild constipation or moderate constipation. , Disp: , Rfl:  .  predniSONE (STERAPRED UNI-PAK 48 TAB) 10 MG (48) TBPK tablet, Take 12 days as directed, Disp: 48 tablet, Rfl: 0 .  simvastatin (ZOCOR) 20 MG tablet, Take 1 tablet (20 mg total) by mouth every evening., Disp: 30 tablet, Rfl: 11 Continue all other maintenance medications as listed above.  Follow up plan: Return in about 3 months (around 10/12/2017) for recheck.  Educational handout given for survey  Remus Loffler PA-C [facility] [address]  [place], [place] [address] [phone]   07/17/2017, 9:27 AM

## 2017-08-11 ENCOUNTER — Telehealth: Payer: Self-pay | Admitting: Physician Assistant

## 2017-08-11 ENCOUNTER — Other Ambulatory Visit: Payer: Self-pay | Admitting: Physician Assistant

## 2017-08-11 DIAGNOSIS — M5136 Other intervertebral disc degeneration, lumbar region: Secondary | ICD-10-CM

## 2017-08-11 DIAGNOSIS — M51369 Other intervertebral disc degeneration, lumbar region without mention of lumbar back pain or lower extremity pain: Secondary | ICD-10-CM

## 2017-08-11 NOTE — Telephone Encounter (Signed)
Patient aware that pharmacy will contact office for ok on oxycodone refill patient should not need refill until 02/25

## 2017-08-23 NOTE — Telephone Encounter (Signed)
error 

## 2017-09-29 ENCOUNTER — Other Ambulatory Visit: Payer: Self-pay | Admitting: Physician Assistant

## 2017-09-29 DIAGNOSIS — N938 Other specified abnormal uterine and vaginal bleeding: Secondary | ICD-10-CM

## 2017-10-20 ENCOUNTER — Ambulatory Visit (INDEPENDENT_AMBULATORY_CARE_PROVIDER_SITE_OTHER): Payer: Self-pay | Admitting: Physician Assistant

## 2017-10-20 ENCOUNTER — Encounter: Payer: Self-pay | Admitting: Physician Assistant

## 2017-10-20 DIAGNOSIS — I10 Essential (primary) hypertension: Secondary | ICD-10-CM

## 2017-10-20 DIAGNOSIS — E7800 Pure hypercholesterolemia, unspecified: Secondary | ICD-10-CM

## 2017-10-20 DIAGNOSIS — M5136 Other intervertebral disc degeneration, lumbar region: Secondary | ICD-10-CM

## 2017-10-20 MED ORDER — PREDNISONE 10 MG (21) PO TBPK: ORAL_TABLET | ORAL | 0 refills | Status: DC

## 2017-10-20 MED ORDER — AZITHROMYCIN 250 MG PO TABS: ORAL_TABLET | ORAL | 0 refills | Status: DC

## 2017-10-20 MED ORDER — OXYCODONE-ACETAMINOPHEN 10-325 MG PO TABS: 1.0000 | ORAL_TABLET | Freq: Three times a day (TID) | ORAL | 0 refills | Status: DC | PRN

## 2017-10-20 MED ORDER — ALBUTEROL SULFATE HFA 108 (90 BASE) MCG/ACT IN AERS: 2.0000 | INHALATION_SPRAY | Freq: Four times a day (QID) | RESPIRATORY_TRACT | 0 refills | Status: DC | PRN

## 2017-10-20 MED ORDER — LISINOPRIL 20 MG PO TABS: 20.0000 mg | ORAL_TABLET | Freq: Every day | ORAL | 11 refills | Status: DC

## 2017-10-20 MED ORDER — SIMVASTATIN 20 MG PO TABS: 20.0000 mg | ORAL_TABLET | Freq: Every evening | ORAL | 11 refills | Status: DC

## 2017-10-22 NOTE — Progress Notes (Signed)
BP 116/74   Pulse 85   Temp 97.9 F (36.6 C) (Oral)   Ht  (1.575 m)   Wt 204 lb 6.4 oz (92.7 kg)   BMI 37.39 kg/m    Subjective:    Patient ID: Sandra Carroll, female    DOB: 1965/09/03, 52 y.o.   MRN: [id number]  HPI: Sandra Carroll is a 52 y.o. female presenting on 10/20/2017 for Hypertension; Hyperlipidemia; Cough; and chest congestion  This patient comes in for recheck on her conditions.  She does have known degenerative disc disease.  She does need refills on the medication.  She is feeling very good overall.  She is tolerating her medications well and working.  She does not have to go and see the neurosurgeon anytime soon.  Past Medical History:  Diagnosis Date  . Chronic back pain   . Complication of anesthesia 1991 and 1998   childbirth --reaction to anesthesia was itching.   . DDD (degenerative disc disease)   . Hemorrhoids   . Hyperlipidemia   . Hypertension   . PONV (postoperative nausea and vomiting)   . Shortness of breath    quit smoking two years ago and still has some sob with exertion    Relevant past medical, surgical, family and social history reviewed and updated as indicated. Interim medical history since our last visit reviewed. Allergies and medications reviewed and updated. DATA REVIEWED: CHART IN EPIC  Family History reviewed for pertinent findings.  Review of Systems  Constitutional: Negative.  Negative for activity change, fatigue and fever.  HENT: Negative.   Eyes: Negative.   Respiratory: Negative.  Negative for cough.   Cardiovascular: Negative.  Negative for chest pain.  Gastrointestinal: Negative.  Negative for abdominal pain.  Endocrine: Negative.   Genitourinary: Negative.  Negative for dysuria.  Musculoskeletal: Positive for arthralgias, back pain and gait problem.  Skin: Negative.     Allergies as of 10/20/2017   No Known Allergies     Medication List        Accurate as of 10/20/17 11:59 PM. Always use your most  recent med list.          albuterol 108 (90 Base) MCG/ACT inhaler Commonly known as:  PROVENTIL HFA;VENTOLIN HFA Inhale 2 puffs into the lungs every 6 (six) hours as needed for wheezing or shortness of breath.   aspirin EC 81 MG tablet Take 81 mg by mouth daily.   azithromycin 250 MG tablet Commonly known as:  ZITHROMAX Z-PAK Take as directed   lisinopril 20 MG tablet Commonly known as:  PRINIVIL,ZESTRIL Take 1 tablet (20 mg total) by mouth daily.   oxyCODONE-acetaminophen 10-325 MG tablet Commonly known as:  PERCOCET Take 1 tablet by mouth every 8 (eight) hours as needed for pain.   oxyCODONE-acetaminophen 10-325 MG tablet Commonly known as:  PERCOCET Take 1 tablet by mouth every 8 (eight) hours as needed for pain.   oxyCODONE-acetaminophen 10-325 MG tablet Commonly known as:  PERCOCET Take 1 tablet by mouth every 8 (eight) hours as needed for pain.   predniSONE 10 MG (21) Tbpk tablet Commonly known as:  STERAPRED UNI-PAK 21 TAB As directed x 6 days   simvastatin 20 MG tablet Commonly known as:  ZOCOR Take 1 tablet (20 mg total) by mouth every evening.   VIORELE 0.15-0.02/0.01 MG (21/5) tablet Generic drug:  desogestrel-ethinyl estradiol TAKE ONE (1) TABLET EACH DAY          Objective:  BP 116/74   Pulse 85   Temp 97.9 F (36.6 C) (Oral)   Ht  (1.575 m)   Wt 204 lb 6.4 oz (92.7 kg)   BMI 37.39 kg/m   No Known Allergies  Wt Readings from Last 3 Encounters:  10/20/17 204 lb 6.4 oz (92.7 kg)  07/14/17 206 lb 9.6 oz (93.7 kg)  04/07/17 208 lb (94.3 kg)    Physical Exam  Constitutional: She is oriented to person, place, and time. She appears well-developed and well-nourished.  HENT:  Head: Normocephalic and atraumatic.  Eyes: Pupils are equal, round, and reactive to light. Conjunctivae and EOM are normal.  Cardiovascular: Normal rate, regular rhythm, normal heart sounds and intact distal pulses.  Pulmonary/Chest: Effort normal and breath  sounds normal.  Abdominal: Soft. Bowel sounds are normal.  Musculoskeletal:       Lumbar back: She exhibits decreased range of motion, tenderness, pain and spasm.       Back:  Neurological: She is alert and oriented to person, place, and time. She has normal reflexes.  Skin: Skin is warm and dry. No rash noted.  Psychiatric: She has a normal mood and affect. Her behavior is normal. Judgment and thought content normal.        Assessment & Plan:   1. DDD (degenerative disc disease), lumbar - oxyCODONE-acetaminophen (PERCOCET) 10-325 MG tablet; Take 1 tablet by mouth every 8 (eight) hours as needed for pain.  Dispense: 90 tablet; Refill: 0 - oxyCODONE-acetaminophen (PERCOCET) 10-325 MG tablet; Take 1 tablet by mouth every 8 (eight) hours as needed for pain.  Dispense: 90 tablet; Refill: 0 - oxyCODONE-acetaminophen (PERCOCET) 10-325 MG tablet; Take 1 tablet by mouth every 8 (eight) hours as needed for pain.  Dispense: 90 tablet; Refill: 0  2. Pure hypercholesterolemia - simvastatin (ZOCOR) 20 MG tablet; Take 1 tablet (20 mg total) by mouth every evening.  Dispense: 30 tablet; Refill: 11  3. Essential hypertension - lisinopril (PRINIVIL,ZESTRIL) 20 MG tablet; Take 1 tablet (20 mg total) by mouth daily.  Dispense: 30 tablet; Refill: 11   Continue all other maintenance medications as listed above.  Follow up plan: No follow-ups on file.  Educational handout given for survey  Remus Loffler PA-C [facility] [address]  [place], [place] [address] [phone]   10/22/2017, 10:01 PM

## 2017-11-22 ENCOUNTER — Other Ambulatory Visit: Payer: Self-pay | Admitting: Physician Assistant

## 2017-11-22 DIAGNOSIS — M5136 Other intervertebral disc degeneration, lumbar region: Secondary | ICD-10-CM

## 2017-12-09 ENCOUNTER — Emergency Department (HOSPITAL_COMMUNITY)
Admission: EM | Admit: 2017-12-09 | Discharge: 2017-12-09 | Disposition: A | Discharge status: Home / Self Care | Payer: Self-pay | Attending: Emergency Medicine | Admitting: Emergency Medicine

## 2017-12-09 ENCOUNTER — Encounter (HOSPITAL_COMMUNITY): Payer: Self-pay | Admitting: Emergency Medicine

## 2017-12-09 ENCOUNTER — Emergency Department (HOSPITAL_COMMUNITY): Payer: Self-pay

## 2017-12-09 DIAGNOSIS — I10 Essential (primary) hypertension: Secondary | ICD-10-CM | POA: Insufficient documentation

## 2017-12-09 DIAGNOSIS — Z7982 Long term (current) use of aspirin: Secondary | ICD-10-CM | POA: Insufficient documentation

## 2017-12-09 DIAGNOSIS — Z79899 Other long term (current) drug therapy: Secondary | ICD-10-CM | POA: Insufficient documentation

## 2017-12-09 DIAGNOSIS — N3001 Acute cystitis with hematuria: Secondary | ICD-10-CM

## 2017-12-09 DIAGNOSIS — N201 Calculus of ureter: Secondary | ICD-10-CM

## 2017-12-09 DIAGNOSIS — Z87891 Personal history of nicotine dependence: Secondary | ICD-10-CM | POA: Insufficient documentation

## 2017-12-09 HISTORY — DX: Disorder of kidney and ureter, unspecified: N28.9

## 2017-12-09 LAB — URINALYSIS, ROUTINE W REFLEX MICROSCOPIC
BILIRUBIN URINE: NEGATIVE
Glucose, UA: NEGATIVE mg/dL
Ketones, ur: NEGATIVE mg/dL
NITRITE: NEGATIVE
PH: 5 (ref 5.0–8.0)
Protein, ur: 100 mg/dL — AB
SPECIFIC GRAVITY, URINE: 1.02 (ref 1.005–1.030)

## 2017-12-09 LAB — BASIC METABOLIC PANEL
ANION GAP: 6 (ref 5–15)
BUN: 24 mg/dL — ABNORMAL HIGH (ref 6–20)
CALCIUM: 10.3 mg/dL (ref 8.9–10.3)
CO2: 25 mmol/L (ref 22–32)
Chloride: 106 mmol/L (ref 101–111)
Creatinine, Ser: 1.28 mg/dL — ABNORMAL HIGH (ref 0.44–1.00)
GFR, EST AFRICAN AMERICAN: 56 mL/min — AB (ref >60)
GFR, EST NON AFRICAN AMERICAN: 48 mL/min — AB (ref >60)
Glucose, Bld: 101 mg/dL — ABNORMAL HIGH (ref 65–99)
POTASSIUM: 4.3 mmol/L (ref 3.5–5.1)
Sodium: 137 mmol/L (ref 135–145)

## 2017-12-09 LAB — PREGNANCY, URINE: Preg Test, Ur: NEGATIVE

## 2017-12-09 LAB — CBC
HEMATOCRIT: 39.8 % (ref 36.0–46.0)
Hemoglobin: 12.8 g/dL (ref 12.0–15.0)
MCH: 30.3 pg (ref 26.0–34.0)
MCHC: 32.2 g/dL (ref 30.0–36.0)
MCV: 94.3 fL (ref 78.0–100.0)
PLATELETS: 327 10*3/uL (ref 150–400)
RBC: 4.22 MIL/uL (ref 3.87–5.11)
RDW: 13.1 % (ref 11.5–15.5)
WBC: 8.5 10*3/uL (ref 4.0–10.5)

## 2017-12-09 MED ORDER — HYDROMORPHONE HCL 1 MG/ML IJ SOLN
1.0000 mg | Freq: Once | INTRAMUSCULAR | Status: AC
  Administered 2017-12-09: 1 mg via INTRAVENOUS
  Filled 2017-12-09: qty 1

## 2017-12-09 MED ORDER — ONDANSETRON HCL 4 MG/2ML IJ SOLN
4.0000 mg | Freq: Once | INTRAMUSCULAR | Status: AC
  Administered 2017-12-09: 4 mg via INTRAVENOUS
  Filled 2017-12-09: qty 2

## 2017-12-09 MED ORDER — SODIUM CHLORIDE 0.9 % IV SOLN
1.0000 g | Freq: Once | INTRAVENOUS | Status: AC
  Administered 2017-12-09: 1 g via INTRAVENOUS
  Filled 2017-12-09: qty 10

## 2017-12-09 MED ORDER — SODIUM CHLORIDE 0.9 % IV SOLN
INTRAVENOUS | Status: DC
  Administered 2017-12-09: 17:00:00 via INTRAVENOUS

## 2017-12-09 MED ORDER — HYDROCODONE-ACETAMINOPHEN 5-325 MG PO TABS: 2.0000 | ORAL_TABLET | ORAL | 0 refills | Status: DC | PRN

## 2017-12-09 MED ORDER — SODIUM CHLORIDE 0.9 % IV BOLUS
1000.0000 mL | Freq: Once | INTRAVENOUS | Status: AC
  Administered 2017-12-09: 1000 mL via INTRAVENOUS

## 2017-12-09 MED ORDER — CEPHALEXIN 500 MG PO CAPS: 500.0000 mg | ORAL_CAPSULE | Freq: Four times a day (QID) | ORAL | 0 refills | Status: DC

## 2017-12-09 NOTE — Discharge Instructions (Addendum)
As we discussed 3 mm right-sided ureteral stone.  Most likely will be able to pass it.  Renal function with some slight abnormalities and urinalysis is suggestive of possibly early infection.  Urine culture sent.  Take the antibiotic Keflex as directed for the next 7 days.  Take the hydrocodone as needed for pain.  Return for any new or worse symptoms.

## 2017-12-09 NOTE — ED Triage Notes (Signed)
Pt states she has been having pain between her shoulder blades for about 3 weeks as well as intermittent abd pain.  States pain hurts worse to bend or move, but feels this is a kidney stone because she has only urinated once today.

## 2017-12-09 NOTE — ED Provider Notes (Signed)
[facility] EMERGENCY DEPARTMENT Provider Note   CSN: [id number] Arrival date & time: 12/09/17  1320     History   Chief Complaint Chief Complaint  Patient presents with  . Back Pain    HPI Sandra Carroll is a 52 y.o. female.  Patient with complaint of low back pain bilaterally as well as pain between shoulder blades.  She says this is unusual pain but not unusual for her it usually means she has a kidney stone.  She is past couple kidney stone in the past few days.  She has a long-standing history of multiple stones has had stents in the past.  In the past is been followed by urology in [place] but has not seen them for over a year because she has not needed to see them.  Patient denies fever no nausea no vomiting     Past Medical History:  Diagnosis Date  . Chronic back pain   . Complication of anesthesia 1991 and 1998   childbirth --reaction to anesthesia was itching.   . DDD (degenerative disc disease)   . Hemorrhoids   . Hyperlipidemia   . Hypertension   . PONV (postoperative nausea and vomiting)   . Renal disorder    kidney stones  . Shortness of breath    quit smoking two years ago and still has some sob with exertion     Patient Active Problem List   Diagnosis Date Noted  . Bursitis of right hip 07/17/2017  . Sciatica of right side 01/03/2017  . DDD (degenerative disc disease), lumbar 10/04/2016  . Essential hypertension 10/04/2016  . Pure hypercholesterolemia 10/04/2016  . DUB (dysfunctional uterine bleeding) 10/04/2016  . Internal hemorrhoids 07/25/2014  . Upper abdominal pain 07/25/2014  . Nausea and vomiting 07/25/2014  . Rash 07/25/2014  . Enteritis 07/25/2014  . Internal hemorrhoids with other complication 06/25/2013  . Rectal bleeding 02/16/2013  . Constipation 02/16/2013    Past Surgical History:  Procedure Laterality Date  . ADENOIDECTOMY  1978  . CESAREAN SECTION  1991 1998   pt had itching with 1991 c-section.   . COLONOSCOPY  N/A 03/01/2013   Dr. Jena Gauss- normal rectum except for anal canal/internal hemorrhoidal tags. colonic mucosa appeared normal.  . KNEE ARTHROSCOPY  2003  . LITHOTRIPSY    . LUMBAR LAMINECTOMY/DECOMPRESSION MICRODISCECTOMY  03/02/2012   Procedure: LUMBAR LAMINECTOMY/DECOMPRESSION MICRODISCECTOMY 1 LEVEL;  Surgeon: Carmela Hurt, MD;  Location: [facility];  Service: Neurosurgery;  Laterality: Left;  LEFT Lumbar Five-Sacral One Laminectomy for Decompression      OB History   None      Home Medications    Prior to Admission medications   Medication Sig Start Date End Date Taking? Authorizing Provider  aspirin EC 81 MG tablet Take 81 mg by mouth daily.   Yes [provider]  lisinopril (PRINIVIL,ZESTRIL) 20 MG tablet Take 1 tablet (20 mg total) by mouth daily. 10/20/17  Yes Remus Loffler, PA-C  oxyCODONE-acetaminophen (PERCOCET) 10-325 MG tablet Take 1 tablet by mouth every 8 (eight) hours as needed for pain. 10/20/17  Yes Remus Loffler, PA-C  simvastatin (ZOCOR) 20 MG tablet Take 1 tablet (20 mg total) by mouth every evening. 10/20/17  Yes Remus Loffler, PA-C  VIORELE 0.15-0.02/0.01 MG (21/5) tablet TAKE ONE (1) TABLET EACH DAY 09/29/17  Yes Remus Loffler, PA-C  albuterol (PROVENTIL HFA;VENTOLIN HFA) 108 (90 Base) MCG/ACT inhaler Inhale 2 puffs into the lungs every 6 (six) hours as needed for  wheezing or shortness of breath. Patient not taking: Reported on 12/09/2017 10/20/17   Remus Loffler, PA-C  cephALEXin (KEFLEX) 500 MG capsule Take 1 capsule (500 mg total) by mouth 4 (four) times daily. 12/09/17   Vanetta Mulders, MD  HYDROcodone-acetaminophen (NORCO/VICODIN) 5-325 MG tablet Take 2 tablets by mouth every 4 (four) hours as needed. 12/09/17   Vanetta Mulders, MD    Family History Family History  Problem Relation Age of Onset  . Hypertension Mother   . Cancer Father   . Hypertension Father   . Colon cancer Neg Hx     Social History Social History   Tobacco Use  . Smoking  status: Former Smoker    Last attempt to quit: 2010    Years since quitting: 9.4  . Smokeless tobacco: Never Used  Substance Use Topics  . Alcohol use: No  . Drug use: No     Allergies   Patient has no known allergies.   Review of Systems Review of Systems  Constitutional: Negative for fever.  HENT: Negative for congestion.   Eyes: Negative for visual disturbance.  Respiratory: Negative for shortness of breath.   Cardiovascular: Negative for chest pain.  Gastrointestinal: Negative for abdominal pain, nausea and vomiting.  Genitourinary: Negative for hematuria.  Musculoskeletal: Positive for back pain.  Skin: Negative for rash.  Neurological: Negative for syncope.  Hematological: Does not bruise/bleed easily.  Psychiatric/Behavioral: Negative for confusion.     Physical Exam Updated Vital Signs BP 98/62   Pulse 80   Temp 97.7 F (36.5 C) (Oral)   Resp 15   Ht 1.626 m (5\' 4" )   Wt 92.5 kg (204 lb)   LMP 11/27/2017   SpO2 99%   BMI 35.02 kg/m   Physical Exam  Constitutional: She is oriented to person, place, and time. She appears well-developed and well-nourished. No distress.  HENT:  Head: Normocephalic and atraumatic.  Mouth/Throat: Oropharynx is clear and moist.  Eyes: Pupils are equal, round, and reactive to light. Conjunctivae and EOM are normal.  Neck: Normal range of motion. Neck supple.  Cardiovascular: Normal rate, regular rhythm and normal heart sounds.  Pulmonary/Chest: Effort normal and breath sounds normal.  Abdominal: Soft. Bowel sounds are normal. There is no tenderness.  Musculoskeletal: Normal range of motion. She exhibits no tenderness.  Lumbar back area normal no tenderness to palpation lumbar thoracic back area.  Neurological: She is alert and oriented to person, place, and time. No cranial nerve deficit or sensory deficit. She exhibits normal muscle tone. Coordination normal.  Skin: Skin is warm. No rash noted.  Nursing note and vitals  reviewed.    ED Treatments / Results  Labs (all labs ordered are listed, but only abnormal results are displayed) Labs Reviewed  URINALYSIS, ROUTINE W REFLEX MICROSCOPIC - Abnormal; Notable for the following components:      Result Value   Color, Urine AMBER (*)    APPearance CLOUDY (*)    Hgb urine dipstick LARGE (*)    Protein, ur 100 (*)    Leukocytes, UA SMALL (*)    RBC / HPF >50 (*)    Bacteria, UA RARE (*)    All other components within normal limits  BASIC METABOLIC PANEL - Abnormal; Notable for the following components:   Glucose, Bld 101 (*)    BUN 24 (*)    Creatinine, Ser 1.28 (*)    GFR calc non Af Amer 48 (*)    GFR calc Af Amer 56 (*)  All other components within normal limits  URINE CULTURE  CBC  PREGNANCY, URINE    EKG None  Radiology Ct Renal Stone Study  Result Date: 12/09/2017 CLINICAL DATA:  52 year old female with history of pain between the shoulder blades for the past 3 weeks and intermittent abdominal pain. EXAM: CT ABDOMEN AND PELVIS WITHOUT CONTRAST TECHNIQUE: Multidetector CT imaging of the abdomen and pelvis was performed following the standard protocol without IV contrast. COMPARISON:  CT the abdomen and pelvis 06/11/2017. FINDINGS: Lower chest: Unremarkable. Hepatobiliary: No definite cystic or solid hepatic lesions are noted on today's noncontrast CT examination. Noncalcified partially cavitary gallstones are noted in the gallbladder. No findings to suggest an acute cholecystitis at this time. Pancreas: No definite pancreatic mass or peripancreatic fluid or inflammatory changes are noted on today's noncontrast CT examination. Spleen: Unremarkable. Adrenals/Urinary Tract: Numerous nonobstructive calculi are noted within the collecting systems of both kidneys measuring up to 4 mm in the interpolar region of the left kidney. In addition, in the distal third of the right ureter there is a 3 mm calculus (axial image 61 of series 2). This is associated  with moderate proximal right hydroureteronephrosis and perinephric/periureteric stranding indicative of obstruction. No left ureteral or bladder calculi are noted. Unenhanced appearance of the urinary bladder is normal. Bilateral adrenal glands are normal in appearance. Stomach/Bowel: Unenhanced appearance of the stomach is normal. There is no pathologic dilatation of small bowel or colon. Normal appendix. Vascular/Lymphatic: Mild aortic atherosclerosis. No lymphadenopathy noted in the abdomen or pelvis. Reproductive: Uterus and ovaries are unremarkable in appearance. Other: No significant volume of ascites.  No pneumoperitoneum. Musculoskeletal: There are no aggressive appearing lytic or blastic lesions noted in the visualized portions of the skeleton. IMPRESSION: 1. 3 mm obstructive calculus in the distal third of the right ureter with moderate proximal right hydroureteronephrosis. 2. Multiple additional nonobstructive calculi in the collecting systems of both kidneys measuring up to 4 mm. 3. Aortic atherosclerosis. 4. Additional incidental findings, as above. Electronically Signed   By: Trudie Reed M.D.   On: 12/09/2017 17:00    Procedures Procedures (including critical care time)  Medications Ordered in ED Medications  0.9 %  sodium chloride infusion ( Intravenous New Bag/Given 12/09/17 1652)  sodium chloride 0.9 % bolus 1,000 mL (0 mLs Intravenous Stopped 12/09/17 1541)  ondansetron (ZOFRAN) injection 4 mg (4 mg Intravenous Given 12/09/17 1436)  HYDROmorphone (DILAUDID) injection 1 mg (1 mg Intravenous Given 12/09/17 1436)  cefTRIAXone (ROCEPHIN) 1 g in sodium chloride 0.9 % 100 mL IVPB (0 g Intravenous Stopped 12/09/17 1819)     Initial Impression / Assessment and Plan / ED Course  I have reviewed the triage vital signs and the nursing notes.  Pertinent labs & imaging results that were available during my care of the patient were reviewed by me and considered in my medical decision making  (see chart for details).    CT renal consistent with 3 mm stone right ureter distal one third of the ureter.  Creatinine elevated some urinalysis level concerning for possible infection or could be related to the fact the patient has passed 2 stones in the past few days as well.  Patient nontoxic no acute distress did not want any pain medicine here.  Urine sent for culture patient was started on Keflex given a dose of Rocephin here will have her follow-up with urology here in [place] or down in [place].  Patient will return for any new or worse symptoms.   Final Clinical  Impressions(s) / ED Diagnoses   Final diagnoses:  Right ureteral stone  Acute cystitis with hematuria    ED Discharge Orders        Ordered    cephALEXin (KEFLEX) 500 MG capsule  4 times daily     12/09/17 1843    HYDROcodone-acetaminophen (NORCO/VICODIN) 5-325 MG tablet  Every 4 hours PRN     12/09/17 1843       Vanetta MuldersZackowski, Dyamond Tolosa, MD 12/09/17 [phone]

## 2017-12-11 LAB — URINE CULTURE

## 2017-12-12 ENCOUNTER — Encounter: Payer: Self-pay | Admitting: *Deleted

## 2017-12-20 ENCOUNTER — Encounter: Payer: Self-pay | Admitting: *Deleted

## 2017-12-22 ENCOUNTER — Other Ambulatory Visit: Payer: Self-pay | Admitting: Physician Assistant

## 2017-12-22 DIAGNOSIS — N938 Other specified abnormal uterine and vaginal bleeding: Secondary | ICD-10-CM

## 2018-02-02 ENCOUNTER — Ambulatory Visit: Payer: Medicaid Other | Admitting: Physician Assistant

## 2018-02-07 ENCOUNTER — Ambulatory Visit: Payer: Medicaid Other | Admitting: Physician Assistant

## 2018-02-08 ENCOUNTER — Ambulatory Visit (INDEPENDENT_AMBULATORY_CARE_PROVIDER_SITE_OTHER): Payer: Self-pay | Admitting: Physician Assistant

## 2018-02-08 ENCOUNTER — Encounter: Payer: Self-pay | Admitting: Physician Assistant

## 2018-02-08 DIAGNOSIS — M5136 Other intervertebral disc degeneration, lumbar region: Secondary | ICD-10-CM

## 2018-02-08 MED ORDER — OXYCODONE-ACETAMINOPHEN 10-325 MG PO TABS: 1.0000 | ORAL_TABLET | Freq: Three times a day (TID) | ORAL | 0 refills | Status: DC | PRN

## 2018-02-09 ENCOUNTER — Ambulatory Visit: Payer: Medicaid Other | Admitting: Physician Assistant

## 2018-02-13 NOTE — Progress Notes (Signed)
BP 101/66   Pulse 84   Temp 98.2 F (36.8 C) (Oral)   Ht 5\' 4"  (1.626 m)   Wt 209 lb (94.8 kg)   BMI 35.87 kg/m    Subjective:    Patient ID: Sandra Slickerammy W Venhuizen, female    DOB: 04/26/1966, 52 y.o.   MRN: [id number]  HPI: Sandra Carroll is a 52 y.o. female presenting on 02/08/2018 for Medical Management of Chronic Issues Chronic pain related to her lumbar degenerative disc disease and status post surgery, is under control, patient has no new complaints. Medications are keeping things stable. Needs refills for the next three months.  [place] Controlled Substance website checked and normal. Drug screen normal this year.   Past Medical History:  Diagnosis Date  . Chronic back pain   . Complication of anesthesia 1991 and 1998   childbirth --reaction to anesthesia was itching.   . DDD (degenerative disc disease)   . Hemorrhoids   . Hyperlipidemia   . Hypertension   . PONV (postoperative nausea and vomiting)   . Renal disorder    kidney stones  . Shortness of breath    quit smoking two years ago and still has some sob with exertion    Relevant past medical, surgical, family and social history reviewed and updated as indicated. Interim medical history since our last visit reviewed. Allergies and medications reviewed and updated. DATA REVIEWED: CHART IN EPIC  Family History reviewed for pertinent findings.  Review of Systems  Constitutional: Negative.   HENT: Negative.   Eyes: Negative.   Respiratory: Negative.   Gastrointestinal: Negative.   Genitourinary: Negative.   Musculoskeletal: Positive for arthralgias and back pain.    Allergies as of 02/08/2018   No Known Allergies     Medication List        Accurate as of 02/08/18 11:59 PM. Always use your most recent med list.          albuterol 108 (90 Base) MCG/ACT inhaler Commonly known as:  PROVENTIL HFA;VENTOLIN HFA Inhale 2 puffs into the lungs every 6 (six) hours as needed for wheezing or shortness of breath.     aspirin EC 81 MG tablet Take 81 mg by mouth daily.   lisinopril 20 MG tablet Commonly known as:  PRINIVIL,ZESTRIL Take 1 tablet (20 mg total) by mouth daily.   oxyCODONE-acetaminophen 10-325 MG tablet Commonly known as:  PERCOCET Take 1 tablet by mouth every 8 (eight) hours as needed for pain.   oxyCODONE-acetaminophen 10-325 MG tablet Commonly known as:  PERCOCET Take 1 tablet by mouth every 8 (eight) hours as needed for pain.   oxyCODONE-acetaminophen 10-325 MG tablet Commonly known as:  PERCOCET Take 1 tablet by mouth every 8 (eight) hours as needed for pain.   simvastatin 20 MG tablet Commonly known as:  ZOCOR Take 1 tablet (20 mg total) by mouth every evening.   VIORELE 0.15-0.02/0.01 MG (21/5) tablet Generic drug:  desogestrel-ethinyl estradiol TAKE ONE (1) TABLET EACH DAY          Objective:    BP 101/66   Pulse 84   Temp 98.2 F (36.8 C) (Oral)   Ht 5\' 4"  (1.626 m)   Wt 209 lb (94.8 kg)   BMI 35.87 kg/m   No Known Allergies  Wt Readings from Last 3 Encounters:  02/08/18 209 lb (94.8 kg)  12/09/17 204 lb (92.5 kg)  10/20/17 204 lb 6.4 oz (92.7 kg)    Physical Exam  Constitutional: She is oriented  to person, place, and time. She appears well-developed and well-nourished.  HENT:  Head: Normocephalic and atraumatic.  Eyes: Pupils are equal, round, and reactive to light. Conjunctivae and EOM are normal.  Cardiovascular: Normal rate, regular rhythm, normal heart sounds and intact distal pulses.  Pulmonary/Chest: Effort normal and breath sounds normal.  Abdominal: Soft. Bowel sounds are normal.  Musculoskeletal:       Lumbar back: She exhibits decreased range of motion, tenderness, pain and spasm.  Neurological: She is alert and oriented to person, place, and time. She has normal reflexes.  Skin: Skin is warm and dry. No rash noted.  Psychiatric: She has a normal mood and affect. Her behavior is normal. Judgment and thought content normal.         Assessment & Plan:   1. DDD (degenerative disc disease), lumbar - oxyCODONE-acetaminophen (PERCOCET) 10-325 MG tablet; Take 1 tablet by mouth every 8 (eight) hours as needed for pain.  Dispense: 90 tablet; Refill: 0 - oxyCODONE-acetaminophen (PERCOCET) 10-325 MG tablet; Take 1 tablet by mouth every 8 (eight) hours as needed for pain.  Dispense: 90 tablet; Refill: 0 - oxyCODONE-acetaminophen (PERCOCET) 10-325 MG tablet; Take 1 tablet by mouth every 8 (eight) hours as needed for pain.  Dispense: 90 tablet; Refill: 0   Continue all other maintenance medications as listed above.  Follow up plan: Return in about 3 months (around 05/11/2018).  Educational handout given for survey  Remus Loffler PA-C [facility] [address]  [place], [place] [address] [phone]   02/13/2018, 7:55 AM

## 2018-05-03 ENCOUNTER — Emergency Department (HOSPITAL_COMMUNITY): Payer: Self-pay

## 2018-05-03 ENCOUNTER — Encounter (HOSPITAL_COMMUNITY): Payer: Self-pay | Admitting: Emergency Medicine

## 2018-05-03 ENCOUNTER — Emergency Department (HOSPITAL_COMMUNITY)
Admission: EM | Admit: 2018-05-03 | Discharge: 2018-05-03 | Disposition: A | Discharge status: Home / Self Care | Payer: Self-pay | Attending: Emergency Medicine | Admitting: Emergency Medicine

## 2018-05-03 DIAGNOSIS — M5136 Other intervertebral disc degeneration, lumbar region: Secondary | ICD-10-CM

## 2018-05-03 DIAGNOSIS — I10 Essential (primary) hypertension: Secondary | ICD-10-CM | POA: Insufficient documentation

## 2018-05-03 DIAGNOSIS — Z79899 Other long term (current) drug therapy: Secondary | ICD-10-CM | POA: Insufficient documentation

## 2018-05-03 DIAGNOSIS — M51369 Other intervertebral disc degeneration, lumbar region without mention of lumbar back pain or lower extremity pain: Secondary | ICD-10-CM

## 2018-05-03 DIAGNOSIS — N200 Calculus of kidney: Secondary | ICD-10-CM

## 2018-05-03 DIAGNOSIS — Z7982 Long term (current) use of aspirin: Secondary | ICD-10-CM | POA: Insufficient documentation

## 2018-05-03 DIAGNOSIS — Z87891 Personal history of nicotine dependence: Secondary | ICD-10-CM | POA: Insufficient documentation

## 2018-05-03 DIAGNOSIS — N390 Urinary tract infection, site not specified: Secondary | ICD-10-CM

## 2018-05-03 LAB — BASIC METABOLIC PANEL
Anion gap: 7 (ref 5–15)
BUN: 12 mg/dL (ref 6–20)
CALCIUM: 10 mg/dL (ref 8.9–10.3)
CHLORIDE: 104 mmol/L (ref 98–111)
CO2: 26 mmol/L (ref 22–32)
CREATININE: 0.87 mg/dL (ref 0.44–1.00)
GLUCOSE: 122 mg/dL — AB (ref 70–99)
Potassium: 3.9 mmol/L (ref 3.5–5.1)
Sodium: 137 mmol/L (ref 135–145)

## 2018-05-03 LAB — CBC WITH DIFFERENTIAL/PLATELET
Abs Immature Granulocytes: 0.03 10*3/uL (ref 0.00–0.07)
Basophils Absolute: 0.1 10*3/uL (ref 0.0–0.1)
Basophils Relative: 1 %
EOS ABS: 0 10*3/uL (ref 0.0–0.5)
Eosinophils Relative: 0 %
HCT: 40.7 % (ref 36.0–46.0)
HEMOGLOBIN: 12.9 g/dL (ref 12.0–15.0)
Immature Granulocytes: 0 %
LYMPHS ABS: 1.1 10*3/uL (ref 0.7–4.0)
LYMPHS PCT: 10 %
MCH: 31.6 pg (ref 26.0–34.0)
MCHC: 31.7 g/dL (ref 30.0–36.0)
MCV: 99.8 fL (ref 80.0–100.0)
MONO ABS: 0.8 10*3/uL (ref 0.1–1.0)
MONOS PCT: 7 %
Neutro Abs: 8.9 10*3/uL — ABNORMAL HIGH (ref 1.7–7.7)
Neutrophils Relative %: 82 %
Platelets: 282 10*3/uL (ref 150–400)
RBC: 4.08 MIL/uL (ref 3.87–5.11)
RDW: 12.8 % (ref 11.5–15.5)
WBC: 10.9 10*3/uL — ABNORMAL HIGH (ref 4.0–10.5)
nRBC: 0 % (ref 0.0–0.2)

## 2018-05-03 LAB — URINALYSIS, ROUTINE W REFLEX MICROSCOPIC
Bilirubin Urine: NEGATIVE
Glucose, UA: NEGATIVE mg/dL
Ketones, ur: NEGATIVE mg/dL
Nitrite: POSITIVE — AB
Protein, ur: 100 mg/dL — AB
Specific Gravity, Urine: 1.011 (ref 1.005–1.030)
pH: 7 (ref 5.0–8.0)

## 2018-05-03 LAB — POC URINE PREG, ED: Preg Test, Ur: NEGATIVE

## 2018-05-03 MED ORDER — KETOROLAC TROMETHAMINE 30 MG/ML IJ SOLN
30.0000 mg | Freq: Once | INTRAMUSCULAR | Status: DC
Start: 2018-05-03 — End: 2018-05-03

## 2018-05-03 MED ORDER — ONDANSETRON HCL 4 MG/2ML IJ SOLN
4.0000 mg | Freq: Once | INTRAMUSCULAR | Status: AC
  Administered 2018-05-03: 4 mg via INTRAVENOUS
  Filled 2018-05-03: qty 2

## 2018-05-03 MED ORDER — CEPHALEXIN 500 MG PO CAPS: ORAL_CAPSULE | ORAL | 0 refills | Status: DC

## 2018-05-03 MED ORDER — OXYCODONE-ACETAMINOPHEN 5-325 MG PO TABS
1.0000 | ORAL_TABLET | Freq: Once | ORAL | Status: AC
  Administered 2018-05-03: 1 via ORAL
  Filled 2018-05-03: qty 1

## 2018-05-03 MED ORDER — SODIUM CHLORIDE 0.9 % IV BOLUS
1000.0000 mL | Freq: Once | INTRAVENOUS | Status: AC
  Administered 2018-05-03: 1000 mL via INTRAVENOUS

## 2018-05-03 MED ORDER — SODIUM CHLORIDE 0.9 % IV SOLN
1.0000 g | Freq: Once | INTRAVENOUS | Status: AC
  Administered 2018-05-03: 1 g via INTRAVENOUS
  Filled 2018-05-03: qty 10

## 2018-05-03 MED ORDER — TAMSULOSIN HCL 0.4 MG PO CAPS: 0.4000 mg | ORAL_CAPSULE | Freq: Every day | ORAL | 0 refills | Status: DC

## 2018-05-03 MED ORDER — KETOROLAC TROMETHAMINE 10 MG PO TABS: 10.0000 mg | ORAL_TABLET | Freq: Four times a day (QID) | ORAL | 0 refills | Status: DC | PRN

## 2018-05-03 MED ORDER — MORPHINE SULFATE (PF) 4 MG/ML IV SOLN
6.0000 mg | Freq: Once | INTRAVENOUS | Status: AC
Start: 2018-05-03 — End: 2018-05-03
  Administered 2018-05-03: 6 mg via INTRAVENOUS
  Filled 2018-05-03: qty 2

## 2018-05-03 MED ORDER — KETOROLAC TROMETHAMINE 30 MG/ML IJ SOLN
30.0000 mg | Freq: Once | INTRAMUSCULAR | Status: AC
  Administered 2018-05-03: 30 mg via INTRAVENOUS
  Filled 2018-05-03: qty 1

## 2018-05-03 NOTE — ED Triage Notes (Signed)
Pt c/o left flank pain since Sunday. Pt reports that has lots of pressure when urinates but denies blood in urine. Pt reports hx kidney stones.

## 2018-05-03 NOTE — ED Notes (Addendum)
Urine culture collected and sent down by this nurse

## 2018-05-03 NOTE — Discharge Instructions (Signed)
You have evidence of obstructed kidney stones on the left side.  Use urine strainer and take antibiotic as prescribed.  Call and follow up with kidney doctor for further management.  Return if you have any concerns.

## 2018-05-03 NOTE — ED Provider Notes (Signed)
[facility]-EMERGENCY DEPT Provider Note   CSN: [id number] Arrival date & time: 05/03/18  1130     History   Chief Complaint Chief Complaint  Patient presents with  . Flank Pain    HPI Sandra Carroll is a 52 y.o. female.  The history is provided by the patient and medical records. No language interpreter was used.  Flank Pain      52 year old female with history of kidney stones presenting complaining of left flank pain.  Patient report having intermittent pain to her left leg ongoing for the past 5 days.  Pain is sharp, throbbing, pressure sensation, waxing waning and has become progressively worse especially today.  She endorsed mild nausea.  She endorsed pressure while urinating with trace of blood.  Symptoms felt very similar to prior kidney stones that she had in the past.  No report of fever, significant abdominal pain, burning urination or vaginal discharge.  Nothing seems to make the pain better or worse, she has been taking Tylenol at home with some improvement.  She reportedly had lithotripsy and renal stenting in the past,  Past Medical History:  Diagnosis Date  . Chronic back pain   . Complication of anesthesia 1991 and 1998   childbirth --reaction to anesthesia was itching.   . DDD (degenerative disc disease)   . Hemorrhoids   . Hyperlipidemia   . Hypertension   . PONV (postoperative nausea and vomiting)   . Renal disorder    kidney stones  . Shortness of breath    quit smoking two years ago and still has some sob with exertion     Patient Active Problem List   Diagnosis Date Noted  . Bursitis of right hip 07/17/2017  . Sciatica of right side 01/03/2017  . DDD (degenerative disc disease), lumbar 10/04/2016  . Essential hypertension 10/04/2016  . Pure hypercholesterolemia 10/04/2016  . DUB (dysfunctional uterine bleeding) 10/04/2016  . Internal hemorrhoids 07/25/2014  . Upper abdominal pain 07/25/2014  . Nausea and vomiting  07/25/2014  . Rash 07/25/2014  . Enteritis 07/25/2014  . Internal hemorrhoids with other complication 06/25/2013  . Rectal bleeding 02/16/2013  . Constipation 02/16/2013    Past Surgical History:  Procedure Laterality Date  . ADENOIDECTOMY  1978  . CESAREAN SECTION  1991 1998   pt had itching with 1991 c-section.   . COLONOSCOPY N/A 03/01/2013   Dr. Jena Gauss- normal rectum except for anal canal/internal hemorrhoidal tags. colonic mucosa appeared normal.  . KNEE ARTHROSCOPY  2003  . LITHOTRIPSY    . LUMBAR LAMINECTOMY/DECOMPRESSION MICRODISCECTOMY  03/02/2012   Procedure: LUMBAR LAMINECTOMY/DECOMPRESSION MICRODISCECTOMY 1 LEVEL;  Surgeon: Carmela Hurt, MD;  Location: [facility];  Service: Neurosurgery;  Laterality: Left;  LEFT Lumbar Five-Sacral One Laminectomy for Decompression      OB History   None      Home Medications    Prior to Admission medications   Medication Sig Start Date End Date Taking? Authorizing Provider  albuterol (PROVENTIL HFA;VENTOLIN HFA) 108 (90 Base) MCG/ACT inhaler Inhale 2 puffs into the lungs every 6 (six) hours as needed for wheezing or shortness of breath. 10/20/17   Remus Loffler, PA-C  aspirin EC 81 MG tablet Take 81 mg by mouth daily.    [provider]  lisinopril (PRINIVIL,ZESTRIL) 20 MG tablet Take 1 tablet (20 mg total) by mouth daily. 10/20/17   Remus Loffler, PA-C  oxyCODONE-acetaminophen (PERCOCET) 10-325 MG tablet Take 1 tablet by mouth every 8 (eight)  hours as needed for pain. 02/08/18   Remus Loffler, PA-C  oxyCODONE-acetaminophen (PERCOCET) 10-325 MG tablet Take 1 tablet by mouth every 8 (eight) hours as needed for pain. 02/08/18   Remus Loffler, PA-C  oxyCODONE-acetaminophen (PERCOCET) 10-325 MG tablet Take 1 tablet by mouth every 8 (eight) hours as needed for pain. 02/08/18   Remus Loffler, PA-C  simvastatin (ZOCOR) 20 MG tablet Take 1 tablet (20 mg total) by mouth every evening. 10/20/17   Remus Loffler, PA-C  VIORELE  0.15-0.02/0.01 MG (21/5) tablet TAKE ONE (1) TABLET EACH DAY 12/24/17   Remus Loffler, PA-C    Family History Family History  Problem Relation Age of Onset  . Hypertension Mother   . Cancer Father   . Hypertension Father   . Colon cancer Neg Hx     Social History Social History   Tobacco Use  . Smoking status: Former Smoker    Last attempt to quit: 2010    Years since quitting: 9.8  . Smokeless tobacco: Never Used  Substance Use Topics  . Alcohol use: No  . Drug use: No     Allergies   Patient has no known allergies.   Review of Systems Review of Systems  Genitourinary: Positive for flank pain.  All other systems reviewed and are negative.    Physical Exam Updated Vital Signs BP 126/82 (BP Location: Right Arm)   Pulse (!) 104   Temp 97.8 F (36.6 C) (Oral)   Resp 20   Ht 5\' 2"  (1.575 m)   Wt 99.8 kg   SpO2 97%   BMI 40.24 kg/m   Physical Exam  Constitutional: She appears well-developed and well-nourished. No distress.  HENT:  Head: Atraumatic.  Eyes: Conjunctivae are normal.  Neck: Neck supple.  Cardiovascular: Normal rate and regular rhythm.  Pulmonary/Chest: Breath sounds normal.  Abdominal: Soft. Bowel sounds are normal. She exhibits no distension. There is no tenderness.  Genitourinary:  Genitourinary Comments: Left CVA tenderness on percussion  Neurological: She is alert.  Skin: No rash noted.  Psychiatric: She has a normal mood and affect.  Nursing note and vitals reviewed.    ED Treatments / Results  Labs (all labs ordered are listed, but only abnormal results are displayed) Labs Reviewed  URINALYSIS, ROUTINE W REFLEX MICROSCOPIC - Abnormal; Notable for the following components:      Result Value   APPearance TURBID (*)    Hgb urine dipstick MODERATE (*)    Protein, ur 100 (*)    Nitrite POSITIVE (*)    Leukocytes, UA LARGE (*)    Bacteria, UA MANY (*)    All other components within normal limits  CBC WITH DIFFERENTIAL/PLATELET -  Abnormal; Notable for the following components:   WBC 10.9 (*)    Neutro Abs 8.9 (*)    All other components within normal limits  BASIC METABOLIC PANEL - Abnormal; Notable for the following components:   Glucose, Bld 122 (*)    All other components within normal limits  URINE CULTURE  POC URINE PREG, ED    EKG None  Radiology US Renal  Result Date: 05/03/2018 CLINICAL DATA:  Left flank pain EXAM: RENAL / URINARY TRACT ULTRASOUND COMPLETE COMPARISON:  CT 12/09/2017 FINDINGS: Right Kidney: Renal measurements: 11.1 x 6.2 x 6 cm = volume: 222.6 mL . Echogenicity within normal limits. Moderate right-sided hydronephrosis with intrarenal calculi the largest is interpolar at 4.2 mm. No apparent renal mass. Left Kidney: Renal measurements: 11.8 x  6.1 x 5.2 cm = volume: 98 mL. Echogenicity within normal limits. No mass. Marked hydronephrosis with a 3 mm upper pole renal calculus noted. Bladder: Under distended in appearance without focal mural thickening. No demonstrable ureteral jets are noted suggesting obstruction. IMPRESSION: Moderate right and marked left dilatation of the renal collecting systems compatible with hydronephrosis with bilateral intrarenal calculi. No demonstrable ureteral jets to suggest patency of the ureters. Obstruction of the ureters is therefore raised possibly from ureteric calculi though the possibility of stricture, ureteral lesions, extrinsic mass effect on the ureters or retroperitoneal fibrosis accounting for this appearance are not entirely excluded. Consider CT for further assessment. Electronically Signed   By: Tollie Ethavid  Kwon M.D.   On: 05/03/2018 14:53   Ct Renal Stone Study  Result Date: 05/03/2018 CLINICAL DATA:  Flank pain for several days EXAM: CT ABDOMEN AND PELVIS WITHOUT CONTRAST TECHNIQUE: Multidetector CT imaging of the abdomen and pelvis was performed following the standard protocol without IV contrast. COMPARISON:  Ultrasound from earlier in the same day as  well as prior CT from 12/09/2017 FINDINGS: Lower chest: Lung bases are free of acute infiltrate or sizable effusion. Hepatobiliary: Liver is well visualized and within normal limits. Multiple gallstones are seen similar to that noted on the prior exam. Pancreas: Unremarkable. No pancreatic ductal dilatation or surrounding inflammatory changes. Spleen: Normal in size without focal abnormality. Adrenals/Urinary Tract: The adrenal glands are within normal limits. Kidneys demonstrate bilateral nonobstructing renal calculi as well as bilateral hydronephrosis. This is slightly worse on the left than the right. In the mid left ureter there are at least 2 ureteral stones identified causing the obstructive change. The largest of these measures 6 mm. Perinephric stranding and peri ureteral stranding is noted on the left. This may represent a superimposed infection. Correlation with urinalysis is recommended. On the right the collecting system and ureter are dilated as well. This extends to the level of the urinary bladder. No definitive calculus is seen. The bladder is partially distended. Stomach/Bowel: No obstructive or inflammatory changes of the bowel are seen. The appendix is within normal limits. Vascular/Lymphatic: No significant vascular findings are present. No enlarged abdominal or pelvic lymph nodes. Reproductive: Uterus and bilateral adnexa are unremarkable. Other: No abdominal wall hernia or abnormality. No abdominopelvic ascites. Musculoskeletal: Degenerative changes of the lumbar spine are seen. IMPRESSION: Bilateral nonobstructing renal calculi. Bilateral hydronephrosis left worse than right with 2 stones within the mid left ureter causing the obstructive change. The largest of these measures 6 mm in dimension. The right ureter shows no definitive stone although these changes could be related to edema from recently passed stone. Perinephric stranding is noted on the left as well as peri ureteral stranding  suggestive of underlying UTI. Correlation with urinalysis is recommended. Cholelithiasis without complicating factors. Electronically Signed   By: Alcide CleverMark  Lukens M.D.   On: 05/03/2018 16:04    Procedures Procedures (including critical care time)  Medications Ordered in ED Medications  oxyCODONE-acetaminophen (PERCOCET/ROXICET) 5-325 MG per tablet 1 tablet (has no administration in time range)  sodium chloride 0.9 % bolus 1,000 mL (0 mLs Intravenous Stopped 05/03/18 1443)  ketorolac (TORADOL) 30 MG/ML injection 30 mg (30 mg Intravenous Given 05/03/18 1331)  cefTRIAXone (ROCEPHIN) 1 g in sodium chloride 0.9 % 100 mL IVPB (0 g Intravenous Stopped 05/03/18 1443)  ondansetron (ZOFRAN) injection 4 mg (4 mg Intravenous Given 05/03/18 1331)  morphine 4 MG/ML injection 6 mg (6 mg Intravenous Given 05/03/18 1536)     Initial Impression /  Assessment and Plan / ED Course  I have reviewed the triage vital signs and the nursing notes.  Pertinent labs & imaging results that were available during my care of the patient were reviewed by me and considered in my medical decision making (see chart for details).     BP (!) 128/95   Pulse 82   Temp 97.8 F (36.6 C) (Oral)   Resp 18   Ht 5\' 2"  (1.575 m)   Wt 99.8 kg   SpO2 97%   BMI 40.24 kg/m    Final Clinical Impressions(s) / ED Diagnoses   Final diagnoses:  Nephrolithiasis  Lower urinary tract infectious disease    ED Discharge Orders         Ordered    tamsulosin (FLOMAX) 0.4 MG CAPS capsule  Daily     05/03/18 1821    cephALEXin (KEFLEX) 500 MG capsule     05/03/18 1821    ketorolac (TORADOL) 10 MG tablet  Every 6 hours PRN     05/03/18 1821         1:15 PM Patient with history of kidney stones here with left flank pain suggestive of nephrolithiasis.  UA shows moderate amount of hemoglobin and urine dipsticks with positive nitrite and many bacteria concerning for obstructive kidney stone leading to urinary tract infection. Will  initiate Rocephin, check basic labs, obtain renal ultrasound and provide pain medication along with antinausea medication.  4:38 PM Normal renal function.  WBC is 10.9.  Pregnancy test is negative.  CT scan renal stone study demonstrate bilateral hydroureter hydronephrosis left worse than right with 2 stones within the mid left ureter causing obstructive changes.  The largest of the stone is 6 mm in the dimension.  The right ureter shows no definitive stone although the edema could be related to recently passed stone.  Perinephric stranding is noted in the left as well as periureteral stranding suggestive of underlying UTI.  Urine did confirm that she has a urinary tract infection.  Given this CT results, will consult nephrologist for recommendation.  Patient is currently afebrile.  4:48 PM Appreciate consultation from oncall nephrologist Dr. Mena Goes who felt pt can f/u outpt for further management. I discussed the plan with pt.  Pt does want a urine strainer and she will call and f/u with urology for further care.  Will d/c home with percocet, flomax and keflex.  In order to decrease risk of narcotic abuse. Pt's record were checked using the [place] Controlled Substance database.     Fayrene Helper, PA-C 05/03/18 Kristeen Mans    Raeford Razor, MD 05/04/18 [phone]

## 2018-05-05 LAB — URINE CULTURE

## 2018-05-06 ENCOUNTER — Telehealth: Payer: Self-pay

## 2018-05-06 NOTE — Telephone Encounter (Signed)
Post ED Visit - Positive Culture Follow-up  Culture report reviewed by antimicrobial stewardship pharmacist:  []  Sandra Carroll, Pharm.D. []  Sandra Carroll, Pharm.D., BCPS AQ-ID [x]  Sandra Carroll, Pharm.D., BCPS []  Sandra Carroll, Pharm.D., BCPS []  Sandra Carroll, Sandra Rainbow BoulevardPharm.D., BCPS, AAHIVP []  Sandra Carroll, Pharm.D., BCPS, AAHIVP []  Sandra Carroll, PharmD, BCPS []  Sandra Carroll, PharmD, BCPS []  Sandra Carroll, PharmD, BCPS []  Sandra Carroll, PharmD  Positive urine culture Treated with Cephalexin, organism sensitive to the same and no further patient follow-up is required at this time.  Sandra Carroll, Sandra Braddock Burnett 05/06/2018, 11:13 AM

## 2018-05-11 ENCOUNTER — Encounter: Payer: Self-pay | Admitting: Physician Assistant

## 2018-05-11 ENCOUNTER — Ambulatory Visit (INDEPENDENT_AMBULATORY_CARE_PROVIDER_SITE_OTHER): Payer: Self-pay | Admitting: Physician Assistant

## 2018-05-11 DIAGNOSIS — M5136 Other intervertebral disc degeneration, lumbar region: Secondary | ICD-10-CM

## 2018-05-11 MED ORDER — OXYCODONE-ACETAMINOPHEN 10-325 MG PO TABS
1.0000 | ORAL_TABLET | Freq: Three times a day (TID) | ORAL | 0 refills | Status: DC | PRN
Start: 2018-05-11 — End: 2018-08-14

## 2018-05-11 MED ORDER — TAMSULOSIN HCL 0.4 MG PO CAPS: 0.8000 mg | ORAL_CAPSULE | Freq: Every day | ORAL | 5 refills | Status: DC

## 2018-05-11 MED ORDER — OXYCODONE-ACETAMINOPHEN 10-325 MG PO TABS: 1.0000 | ORAL_TABLET | Freq: Three times a day (TID) | ORAL | 0 refills | Status: DC | PRN

## 2018-05-11 NOTE — Patient Instructions (Signed)
Dietary Guidelines to Help Prevent Kidney Stones Kidney stones are deposits of minerals and salts that form inside your kidneys. Your risk of developing kidney stones may be greater depending on your diet, your lifestyle, the medicines you take, and whether you have certain medical conditions. Most people can reduce their chances of developing kidney stones by following the instructions below. Depending on your overall health and the type of kidney stones you tend to develop, your dietitian may give you more specific instructions. What are tips for following this plan? Reading food labels  Choose foods with "no salt added" or "low-salt" labels. Limit your sodium intake to less than 1500 mg per day.  Choose foods with calcium for each meal and snack. Try to eat about 300 mg of calcium at each meal. Foods that contain 200-500 mg of calcium per serving include: ? 8 oz (237 ml) of milk, fortified nondairy milk, and fortified fruit juice. ? 8 oz (237 ml) of kefir, yogurt, and soy yogurt. ? 4 oz (118 ml) of tofu. ? 1 oz of cheese. ? 1 cup (300 g) of dried figs. ? 1 cup (91 g) of cooked broccoli. ? 1-3 oz can of sardines or mackerel.  Most people need 1000 to 1500 mg of calcium each day. Talk to your dietitian about how much calcium is recommended for you. Shopping  Buy plenty of fresh fruits and vegetables. Most people do not need to avoid fruits and vegetables, even if they contain nutrients that may contribute to kidney stones.  When shopping for convenience foods, choose: ? Whole pieces of fruit. ? Premade salads with dressing on the side. ? Low-fat fruit and yogurt smoothies.  Avoid buying frozen meals or prepared deli foods.  Look for foods with live cultures, such as yogurt and kefir. Cooking  Do not add salt to food when cooking. Place a salt shaker on the table and allow each person to add his or her own salt to taste.  Use vegetable protein, such as beans, textured vegetable  protein (TVP), or tofu instead of meat in pasta, casseroles, and soups. Meal planning  Eat less salt, if told by your dietitian. To do this: ? Avoid eating processed or premade food. ? Avoid eating fast food.  Eat less animal protein, including cheese, meat, poultry, or fish, if told by your dietitian. To do this: ? Limit the number of times you have meat, poultry, fish, or cheese each week. Eat a diet free of meat at least 2 days a week. ? Eat only one serving each day of meat, poultry, fish, or seafood. ? When you prepare animal protein, cut pieces into small portion sizes. For most meat and fish, one serving is about the size of one deck of cards.  Eat at least 5 servings of fresh fruits and vegetables each day. To do this: ? Keep fruits and vegetables on hand for snacks. ? Eat 1 piece of fruit or a handful of berries with breakfast. ? Have a salad and fruit at lunch. ? Have two kinds of vegetables at dinner.  Limit foods that are high in a substance called oxalate. These include: ? Spinach. ? Rhubarb. ? Beets. ? Potato chips and french fries. ? Nuts.  If you regularly take a diuretic medicine, make sure to eat at least 1-2 fruits or vegetables high in potassium each day. These include: ? Avocado. ? Banana. ? Orange, prune, carrot, or tomato juice. ? Baked potato. ? Cabbage. ? Beans and split peas.   General instructions  Drink enough fluid to keep your urine clear or pale yellow. This is the most important thing you can do.  Talk to your health care provider and dietitian about taking daily supplements. Depending on your health and the cause of your kidney stones, you may be advised: ? Not to take supplements with vitamin C. ? To take a calcium supplement. ? To take a daily probiotic supplement. ? To take other supplements such as magnesium, fish oil, or vitamin B6.  Take all medicines and supplements as told by your health care provider.  Limit alcohol intake to no more  than 1 drink a day for nonpregnant women and 2 drinks a day for men. One drink equals 12 oz of beer, 5 oz of wine, or 1 oz of hard liquor.  Lose weight if told by your health care provider. Work with your dietitian to find strategies and an eating plan that works best for you. What foods are not recommended? Limit your intake of the following foods, or as told by your dietitian. Talk to your dietitian about specific foods you should avoid based on the type of kidney stones and your overall health. Grains Breads. Bagels. Rolls. Baked goods. Salted crackers. Cereal. Pasta. Vegetables Spinach. Rhubarb. Beets. Canned vegetables. Pickles. Olives. Meats and other protein foods Nuts. Nut butters. Large portions of meat, poultry, or fish. Salted or cured meats. Deli meats. Hot dogs. Sausages. Dairy Cheese. Beverages Regular soft drinks. Regular vegetable juice. Seasonings and other foods Seasoning blends with salt. Salad dressings. Canned soups. Soy sauce. Ketchup. Barbecue sauce. Canned pasta sauce. Casseroles. Pizza. Lasagna. Frozen meals. Potato chips. French fries. Summary  You can reduce your risk of kidney stones by making changes to your diet.  The most important thing you can do is drink enough fluid. You should drink enough fluid to keep your urine clear or pale yellow.  Ask your health care provider or dietitian how much protein from animal sources you should eat each day, and also how much salt and calcium you should have each day. This information is not intended to replace advice given to you by your health care provider. Make sure you discuss any questions you have with your health care provider. Document Released: 10/01/2010 Document Revised: 05/17/2016 Document Reviewed: 05/17/2016 Elsevier Interactive Patient Education  2018 [employer]  

## 2018-05-12 NOTE — Progress Notes (Signed)
BP 128/88   Pulse 91   Temp (!) 96.8 F (36 C) (Oral)   Ht 5\' 2"  (1.575 m)   Wt 205 lb 12.8 oz (93.4 kg)   BMI 37.64 kg/m    Subjective:    Patient ID: Sandra Carroll, female    DOB: 1965-11-20, 52 y.o.   MRN: [id number]  HPI: Sandra Carroll is a 52 y.o. female presenting on 05/11/2018 for Pain (3 month )  This patient comes in for periodic recheck on medications and conditions including DDD.   All medications are reviewed today. There are no reports of any problems with the medications. All of the medical conditions are reviewed and updated.  Lab work is reviewed and will be ordered as medically necessary. There are no new problems reported with today's visit.   Past Medical History:  Diagnosis Date  . Chronic back pain   . Complication of anesthesia 1991 and 1998   childbirth --reaction to anesthesia was itching.   . DDD (degenerative disc disease)   . Hemorrhoids   . Hyperlipidemia   . Hypertension   . PONV (postoperative nausea and vomiting)   . Renal disorder    kidney stones  . Shortness of breath    quit smoking two years ago and still has some sob with exertion    Relevant past medical, surgical, family and social history reviewed and updated as indicated. Interim medical history since our last visit reviewed. Allergies and medications reviewed and updated. DATA REVIEWED: CHART IN EPIC  Family History reviewed for pertinent findings.  Review of Systems  Constitutional: Negative.   HENT: Negative.   Eyes: Negative.   Respiratory: Negative.   Gastrointestinal: Negative.   Genitourinary: Negative.     Allergies as of 05/11/2018   No Known Allergies     Medication List        Accurate as of 05/11/18 11:59 PM. Always use your most recent med list.          acetaminophen 325 MG tablet Commonly known as:  TYLENOL Take 975-1,300 mg by mouth every 6 (six) hours as needed for headache.   albuterol 108 (90 Base) MCG/ACT inhaler Commonly known as:   PROVENTIL HFA;VENTOLIN HFA Inhale 2 puffs into the lungs every 6 (six) hours as needed for wheezing or shortness of breath.   aspirin EC 81 MG tablet Take 81 mg by mouth daily.   lisinopril 20 MG tablet Commonly known as:  PRINIVIL,ZESTRIL Take 1 tablet (20 mg total) by mouth daily.   oxyCODONE-acetaminophen 10-325 MG tablet Commonly known as:  PERCOCET Take 1 tablet by mouth every 8 (eight) hours as needed for pain.   oxyCODONE-acetaminophen 10-325 MG tablet Commonly known as:  PERCOCET Take 1 tablet by mouth every 8 (eight) hours as needed for pain.   oxyCODONE-acetaminophen 10-325 MG tablet Commonly known as:  PERCOCET Take 1 tablet by mouth every 8 (eight) hours as needed for pain.   simvastatin 20 MG tablet Commonly known as:  ZOCOR Take 1 tablet (20 mg total) by mouth every evening.   tamsulosin 0.4 MG Caps capsule Commonly known as:  FLOMAX Take 2 capsules (0.8 mg total) by mouth daily.   VIORELE 0.15-0.02/0.01 MG (21/5) tablet Generic drug:  desogestrel-ethinyl estradiol TAKE ONE (1) TABLET EACH DAY          Objective:    BP 128/88   Pulse 91   Temp (!) 96.8 F (36 C) (Oral)   Ht 5\' 2"  (1.575 m)  Wt 205 lb 12.8 oz (93.4 kg)   BMI 37.64 kg/m   No Known Allergies  Wt Readings from Last 3 Encounters:  05/11/18 205 lb 12.8 oz (93.4 kg)  05/03/18 220 lb (99.8 kg)  02/08/18 209 lb (94.8 kg)    Physical Exam  Constitutional: She is oriented to person, place, and time. She appears well-developed and well-nourished.  HENT:  Head: Normocephalic and atraumatic.  Eyes: Pupils are equal, round, and reactive to light. Conjunctivae and EOM are normal.  Cardiovascular: Normal rate, regular rhythm, normal heart sounds and intact distal pulses.  Pulmonary/Chest: Effort normal and breath sounds normal.  Abdominal: Soft. Bowel sounds are normal.  Neurological: She is alert and oriented to person, place, and time. She has normal reflexes.  Skin: Skin is warm and  dry. No rash noted.  Psychiatric: She has a normal mood and affect. Her behavior is normal. Judgment and thought content normal.        Assessment & Plan:   1. DDD (degenerative disc disease), lumbar - oxyCODONE-acetaminophen (PERCOCET) 10-325 MG tablet; Take 1 tablet by mouth every 8 (eight) hours as needed for pain.  Dispense: 90 tablet; Refill: 0 - oxyCODONE-acetaminophen (PERCOCET) 10-325 MG tablet; Take 1 tablet by mouth every 8 (eight) hours as needed for pain.  Dispense: 90 tablet; Refill: 0 - oxyCODONE-acetaminophen (PERCOCET) 10-325 MG tablet; Take 1 tablet by mouth every 8 (eight) hours as needed for pain.  Dispense: 90 tablet; Refill: 0   Continue all other maintenance medications as listed above.  Follow up plan: No follow-ups on file.  Educational handout given for survey  Remus LofflerAngel S. Zeb Rawl PA-C [facility] [address]  [place], [place] [id number] [phone]   05/12/2018, 5:05 PM

## 2018-08-13 ENCOUNTER — Telehealth: Payer: Self-pay | Admitting: Physician Assistant

## 2018-08-13 NOTE — Telephone Encounter (Signed)
Her surgeon will need to handle post surgical pain

## 2018-08-14 ENCOUNTER — Other Ambulatory Visit: Payer: Self-pay | Admitting: Physician Assistant

## 2018-08-14 DIAGNOSIS — M5136 Other intervertebral disc degeneration, lumbar region: Secondary | ICD-10-CM

## 2018-08-14 MED ORDER — OXYCODONE-ACETAMINOPHEN 10-325 MG PO TABS: 1.0000 | ORAL_TABLET | Freq: Three times a day (TID) | ORAL | 0 refills | Status: DC | PRN

## 2018-08-14 NOTE — Telephone Encounter (Signed)
I sent a limited amount to the pharmacy that she should be able to use until her visit

## 2018-08-14 NOTE — Telephone Encounter (Signed)
See note

## 2018-08-14 NOTE — Telephone Encounter (Signed)
Patient aware and states she has an apt with Lawanna Kobus 3/5 and would like to know if she will fill her pain medications at that appointment? Please advise

## 2018-08-14 NOTE — Telephone Encounter (Signed)
Patient notified that prescription was sent in.  

## 2018-08-16 ENCOUNTER — Ambulatory Visit: Payer: Medicaid Other | Admitting: Physician Assistant

## 2018-08-27 ENCOUNTER — Other Ambulatory Visit: Payer: Self-pay | Admitting: Physician Assistant

## 2018-08-27 ENCOUNTER — Ambulatory Visit (INDEPENDENT_AMBULATORY_CARE_PROVIDER_SITE_OTHER): Payer: Self-pay | Admitting: Physician Assistant

## 2018-08-27 ENCOUNTER — Encounter: Payer: Self-pay | Admitting: Physician Assistant

## 2018-08-27 VITALS — BP 133/79 | HR 98 | Temp 98.0°F | Ht 62.0 in | Wt 198.2 lb

## 2018-08-27 DIAGNOSIS — N938 Other specified abnormal uterine and vaginal bleeding: Secondary | ICD-10-CM

## 2018-08-27 DIAGNOSIS — Z87442 Personal history of urinary calculi: Secondary | ICD-10-CM

## 2018-08-27 DIAGNOSIS — M5136 Other intervertebral disc degeneration, lumbar region: Secondary | ICD-10-CM

## 2018-08-27 DIAGNOSIS — E7800 Pure hypercholesterolemia, unspecified: Secondary | ICD-10-CM

## 2018-08-27 MED ORDER — OXYCODONE-ACETAMINOPHEN 10-325 MG PO TABS: 1.0000 | ORAL_TABLET | Freq: Three times a day (TID) | ORAL | 0 refills | Status: DC | PRN

## 2018-08-27 MED ORDER — DESOGESTREL-ETHINYL ESTRADIOL 0.15-0.02/0.01 MG (21/5) PO TABS: ORAL_TABLET | ORAL | 6 refills | Status: DC

## 2018-08-27 MED ORDER — TAMSULOSIN HCL 0.4 MG PO CAPS: 0.8000 mg | ORAL_CAPSULE | Freq: Every day | ORAL | 11 refills | Status: DC

## 2018-08-27 MED ORDER — IBUPROFEN 200 MG PO TABS: 400.0000 mg | ORAL_TABLET | Freq: Four times a day (QID) | ORAL | 0 refills | Status: DC | PRN

## 2018-08-27 MED ORDER — SIMVASTATIN 20 MG PO TABS: 20.0000 mg | ORAL_TABLET | Freq: Every evening | ORAL | 11 refills | Status: DC

## 2018-08-28 DIAGNOSIS — Z87442 Personal history of urinary calculi: Secondary | ICD-10-CM | POA: Insufficient documentation

## 2018-08-28 NOTE — Progress Notes (Addendum)
BP 133/79   Pulse 98   Temp 98 F (36.7 C) (Oral)   Ht  (1.575 m)   Wt 198 lb 3.2 oz (89.9 kg)   BMI 36.25 kg/m    Subjective:    Patient ID: Sandra Carroll, female    DOB: 1965-11-06, 53 y.o.   MRN: [id number]  HPI: Sandra Carroll is a 53 y.o. female presenting on 08/27/2018 for Back Pain (3 month follow up )  She recently has gall bladder surgery and there was a rupture and ended up with open laparotomy.  Still under the care of the surgeon.  Also has history of kidney stones and they are still present. Flomax refill is needed.   PAIN ASSESSMENT: Cause of pain- DDD with ruptured disc and surgical repair  1.  Soft disc protrusion in the left lateral recess and left neural foramen at L5-S1 compressing the left L5 and S1 nerves. 2.  Small soft disc protrusion into the right lateral recess at L5- S1 without neural impingement. 3.  Right hydronephrosis, new since 12/16/2010.  The right ureter is dilated into the pelvis.  Site and cause of obstruction is not visible on this exam.  This patient returns for a 3 month recheck on narcotic use for the above named conditions  Current medications- oxycodone 10/325 1 every 8 hours Ibuprofen 800 mg Consider gabapentin in the future Medication side effects- none Any concerns- no  Pain on scale of 1-10- 8 Frequency- daily What increases pain- walking and lifting What makes pain Better- nothing Effects on ADL - mild Any change in general medical condition- recent gall bladder removal  Effectiveness of current meds- good Adverse reactions form pain meds-no PMP AWARE website reviewed: Yes Any suspicious activity on PMP Aware: No MME daily dose: 46 MME  Contract on file Last UDS  08/27/2018  [facility] script sent or current  History of overdose or risk of abuse one   Past Medical History:  Diagnosis Date  . Chronic back pain   . Complication of anesthesia 1991 and 1998   childbirth --reaction to anesthesia was  itching.   . DDD (degenerative disc disease)   . Hemorrhoids   . Hyperlipidemia   . Hypertension   . PONV (postoperative nausea and vomiting)   . Renal disorder    kidney stones  . Shortness of breath    quit smoking two years ago and still has some sob with exertion    Relevant past medical, surgical, family and social history reviewed and updated as indicated. Interim medical history since our last visit reviewed. Allergies and medications reviewed and updated. DATA REVIEWED: CHART IN EPIC  Family History reviewed for pertinent findings.  Review of Systems  Constitutional: Negative.   HENT: Negative.   Eyes: Negative.   Respiratory: Negative.   Gastrointestinal: Negative.   Genitourinary: Negative.   Musculoskeletal: Positive for arthralgias, back pain, joint swelling and myalgias.    Allergies as of 08/27/2018   No Known Allergies     Medication List       Accurate as of August 27, 2018 11:59 PM. Always use your most recent med list.        acetaminophen 325 MG tablet Commonly known as:  TYLENOL Take 975-1,300 mg by mouth every 6 (six) hours as needed for headache.   albuterol 108 (90 Base) MCG/ACT inhaler Commonly known as:  PROVENTIL HFA;VENTOLIN HFA Inhale 2 puffs into the lungs every 6 (six) hours as needed for wheezing  or shortness of breath.   aspirin EC 81 MG tablet Take 81 mg by mouth daily.   desogestrel-ethinyl estradiol 0.15-0.02/0.01 MG (21/5) tablet Commonly known as:  Viorele TAKE ONE (1) TABLET EACH DAY   ibuprofen 200 MG tablet Commonly known as:  ADVIL,MOTRIN Take 2 tablets (400 mg total) by mouth every 6 (six) hours as needed.   lisinopril 20 MG tablet Commonly known as:  PRINIVIL,ZESTRIL Take 1 tablet (20 mg total) by mouth daily.   oxyCODONE-acetaminophen 10-325 MG tablet Commonly known as:  PERCOCET Take 1 tablet by mouth every 8 (eight) hours as needed for pain.   oxyCODONE-acetaminophen 10-325 MG tablet Commonly known as:   Percocet Take 1 tablet by mouth every 8 (eight) hours as needed for pain.   oxyCODONE-acetaminophen 10-325 MG tablet Commonly known as:  PERCOCET Take 1 tablet by mouth every 8 (eight) hours as needed for pain.   simvastatin 20 MG tablet Commonly known as:  ZOCOR Take 1 tablet (20 mg total) by mouth every evening.   tamsulosin 0.4 MG Caps capsule Commonly known as:  Flomax Take 2 capsules (0.8 mg total) by mouth daily.          Objective:    BP 133/79   Pulse 98   Temp 98 F (36.7 C) (Oral)   Ht 5\' 2"  (1.575 m)   Wt 198 lb 3.2 oz (89.9 kg)   BMI 36.25 kg/m   No Known Allergies  Wt Readings from Last 3 Encounters:  08/27/18 198 lb 3.2 oz (89.9 kg)  05/11/18 205 lb 12.8 oz (93.4 kg)  05/03/18 220 lb (99.8 kg)    Physical Exam Constitutional:      Appearance: She is well-developed.  HENT:     Head: Normocephalic and atraumatic.  Eyes:     Conjunctiva/sclera: Conjunctivae normal.     Pupils: Pupils are equal, round, and reactive to light.  Cardiovascular:     Rate and Rhythm: Normal rate and regular rhythm.     Heart sounds: Normal heart sounds.  Pulmonary:     Effort: Pulmonary effort is normal.     Breath sounds: Normal breath sounds.  Abdominal:     General: Bowel sounds are normal.     Palpations: Abdomen is soft.  Musculoskeletal:     Lumbar back: She exhibits decreased range of motion, tenderness, pain and spasm.  Skin:    General: Skin is warm and dry.     Findings: No rash.  Neurological:     Mental Status: She is alert and oriented to person, place, and time.     Deep Tendon Reflexes: Reflexes are normal and symmetric.  Psychiatric:        Behavior: Behavior normal.        Thought Content: Thought content normal.        Judgment: Judgment normal.         Assessment & Plan:   1. DDD (degenerative disc disease), lumbar - ToxASSURE Select 13 (MW), Urine - oxyCODONE-acetaminophen (PERCOCET) 10-325 MG tablet; Take 1 tablet by mouth every 8  (eight) hours as needed for pain.  Dispense: 90 tablet; Refill: 0 - oxyCODONE-acetaminophen (PERCOCET) 10-325 MG tablet; Take 1 tablet by mouth every 8 (eight) hours as needed for pain.  Dispense: 90 tablet; Refill: 0 - oxyCODONE-acetaminophen (PERCOCET) 10-325 MG tablet; Take 1 tablet by mouth every 8 (eight) hours as needed for pain.  Dispense: 40 tablet; Refill: 0 - ibuprofen (ADVIL,MOTRIN) 200 MG tablet; Take 2 tablets (400 mg total)  by mouth every 6 (six) hours as needed.  Dispense: 30 tablet; Refill: 0  2. DUB (dysfunctional uterine bleeding) - desogestrel-ethinyl estradiol (VIORELE) 0.15-0.02/0.01 MG (21/5) tablet; TAKE ONE (1) TABLET EACH DAY  Dispense: 28 tablet; Refill: 6  3. Pure hypercholesterolemia - simvastatin (ZOCOR) 20 MG tablet; Take 1 tablet (20 mg total) by mouth every evening.  Dispense: 30 tablet; Refill: 11  4. History of kidney stones - tamsulosin (FLOMAX) 0.4 MG CAPS capsule; Take 2 capsules (0.8 mg total) by mouth daily.  Dispense: 30 capsule; Refill: 11   Continue all other maintenance medications as listed above.  Follow up plan: Return in about 3 months (around 11/27/2018) for recheck.  Educational handout given for survey  Remus Loffler PA-C [facility] [address]  [place], [place] [address] [phone]   08/28/2018, 8:47 PM

## 2018-08-31 LAB — TOXASSURE SELECT 13 (MW), URINE

## 2018-09-03 DIAGNOSIS — K8012 Calculus of gallbladder with acute and chronic cholecystitis without obstruction: Secondary | ICD-10-CM | POA: Insufficient documentation

## 2018-09-18 ENCOUNTER — Telehealth: Payer: Self-pay | Admitting: Physician Assistant

## 2018-09-18 ENCOUNTER — Other Ambulatory Visit: Payer: Self-pay | Admitting: Physician Assistant

## 2018-09-18 DIAGNOSIS — M5136 Other intervertebral disc degeneration, lumbar region: Secondary | ICD-10-CM

## 2018-09-18 MED ORDER — OXYCODONE-ACETAMINOPHEN 10-325 MG PO TABS: 1.0000 | ORAL_TABLET | Freq: Three times a day (TID) | ORAL | 0 refills | Status: DC | PRN

## 2018-09-18 NOTE — Telephone Encounter (Signed)
1st rx was written only for #40 and she is out. They will not fill next one till 09/27/18. It should have been for #90 like the other two??

## 2018-09-18 NOTE — Telephone Encounter (Signed)
done

## 2018-09-18 NOTE — Telephone Encounter (Signed)
Yes it should have been.  I will correct it with the pharmacy

## 2018-11-19 ENCOUNTER — Other Ambulatory Visit: Payer: Self-pay | Admitting: Physician Assistant

## 2018-11-19 DIAGNOSIS — M5136 Other intervertebral disc degeneration, lumbar region: Secondary | ICD-10-CM

## 2018-11-27 ENCOUNTER — Other Ambulatory Visit: Payer: Self-pay

## 2018-11-28 ENCOUNTER — Other Ambulatory Visit: Payer: Self-pay | Admitting: Physician Assistant

## 2018-11-28 ENCOUNTER — Ambulatory Visit (INDEPENDENT_AMBULATORY_CARE_PROVIDER_SITE_OTHER): Payer: Medicaid Other | Admitting: Physician Assistant

## 2018-11-28 ENCOUNTER — Encounter: Payer: Self-pay | Admitting: Physician Assistant

## 2018-11-28 DIAGNOSIS — M5136 Other intervertebral disc degeneration, lumbar region: Secondary | ICD-10-CM

## 2018-11-28 DIAGNOSIS — M51369 Other intervertebral disc degeneration, lumbar region without mention of lumbar back pain or lower extremity pain: Secondary | ICD-10-CM

## 2018-11-28 MED ORDER — OXYCODONE-ACETAMINOPHEN 10-325 MG PO TABS: 1.0000 | ORAL_TABLET | Freq: Three times a day (TID) | ORAL | 0 refills | Status: DC | PRN

## 2018-11-28 MED ORDER — DULOXETINE HCL 30 MG PO CPEP: 30.0000 mg | ORAL_CAPSULE | Freq: Every day | ORAL | 5 refills | Status: DC

## 2018-11-28 MED ORDER — SUMATRIPTAN SUCCINATE 50 MG PO TABS: 50.0000 mg | ORAL_TABLET | ORAL | 5 refills | Status: DC | PRN

## 2018-11-29 ENCOUNTER — Other Ambulatory Visit: Payer: Self-pay | Admitting: Physician Assistant

## 2018-11-29 DIAGNOSIS — I10 Essential (primary) hypertension: Secondary | ICD-10-CM

## 2018-11-29 NOTE — Progress Notes (Signed)
Telephone visit  Subjective: CC: Recheck on chronic pain due to degenerative disc disease PCP: Remus LofflerJones, Yanni Quiroa S, PA-C XBJ:YNWGNHPI:Sandra Donney RankinsW Carroll is a 53 y.o. female calls for telephone consult today. Patient provides verbal consent for consult held via phone.  Patient is identified with 2 separate identifiers.  At this time the entire area is on COVID-19 social distancing and stay home orders are in place.  Patient is of higher risk and therefore we are performing this by a virtual method.  Location of patient: home Location of provider: [facility] Others present for call: no   PAIN ASSESSMENT: Cause of pain-degenerative disc disease  This patient returns for a 3 month recheck on narcotic use for the above named conditions  Current medications-oxycodone 10/325 1 every 8 hours as needed for pain Medication side effects-none Any concerns-no  Pain on scale of 1-10-5 Frequency-Daily What increases pain-walking, standing, lifting What makes pain Better-rest Effects on ADL -mild Any change in general medical condition-no  Effectiveness of current meds-good Adverse reactions form pain meds-no PMP AWARE website reviewed: Yes Any suspicious activity on PMP Aware: No MME daily dose: 45  Contract on file 05/16/2018 Last UDS 08/27/2018  [facility]  History of overdose or risk of abuse no    ROS: Per HPI  No Known Allergies Past Medical History:  Diagnosis Date  . Chronic back pain   . Complication of anesthesia 1991 and 1998   childbirth --reaction to anesthesia was itching.   . DDD (degenerative disc disease)   . Hemorrhoids   . Hyperlipidemia   . Hypertension   . PONV (postoperative nausea and vomiting)   . Renal disorder    kidney stones  . Shortness of breath    quit smoking two years ago and still has some sob with exertion     Current Outpatient Medications:  .  acetaminophen (TYLENOL) 325 MG tablet, Take 975-1,300 mg by mouth every 6 (six) hours  as needed for headache., Disp: , Rfl:  .  albuterol (PROVENTIL HFA;VENTOLIN HFA) 108 (90 Base) MCG/ACT inhaler, Inhale 2 puffs into the lungs every 6 (six) hours as needed for wheezing or shortness of breath., Disp: 1 Inhaler, Rfl: 0 .  aspirin EC 81 MG tablet, Take 81 mg by mouth daily., Disp: , Rfl:  .  desogestrel-ethinyl estradiol (VIORELE) 0.15-0.02/0.01 MG (21/5) tablet, TAKE ONE (1) TABLET EACH DAY, Disp: 28 tablet, Rfl: 6 .  DULoxetine (CYMBALTA) 30 MG capsule, Take 1-2 capsules (30-60 mg total) by mouth daily., Disp: 60 capsule, Rfl: 5 .  ibuprofen (ADVIL,MOTRIN) 200 MG tablet, Take 2 tablets (400 mg total) by mouth every 6 (six) hours as needed., Disp: 30 tablet, Rfl: 0 .  lisinopril (ZESTRIL) 20 MG tablet, TAKE ONE (1) TABLET EACH DAY, Disp: 30 tablet, Rfl: 11 .  oxyCODONE-acetaminophen (PERCOCET) 10-325 MG tablet, Take 1 tablet by mouth every 8 (eight) hours as needed for pain., Disp: 90 tablet, Rfl: 0 .  oxyCODONE-acetaminophen (PERCOCET) 10-325 MG tablet, Take 1 tablet by mouth every 8 (eight) hours as needed for pain., Disp: 90 tablet, Rfl: 0 .  oxyCODONE-acetaminophen (PERCOCET) 10-325 MG tablet, Take 1 tablet by mouth every 8 (eight) hours as needed for pain., Disp: 90 tablet, Rfl: 0 .  simvastatin (ZOCOR) 20 MG tablet, Take 1 tablet (20 mg total) by mouth every evening., Disp: 30 tablet, Rfl: 11 .  SUMAtriptan (IMITREX) 50 MG tablet, Take 1 tablet (50 mg total) by mouth every 2 (two) hours as  needed for migraine. May repeat in 2 hours if headache persists or recurs., Disp: 10 tablet, Rfl: 5 .  tamsulosin (FLOMAX) 0.4 MG CAPS capsule, Take 2 capsules (0.8 mg total) by mouth daily., Disp: 30 capsule, Rfl: 11  Assessment/ Plan: 53 y.o. female   1. DDD (degenerative disc disease), lumbar - oxyCODONE-acetaminophen (PERCOCET) 10-325 MG tablet; Take 1 tablet by mouth every 8 (eight) hours as needed for pain.  Dispense: 90 tablet; Refill: 0 - oxyCODONE-acetaminophen (PERCOCET) 10-325 MG  tablet; Take 1 tablet by mouth every 8 (eight) hours as needed for pain.  Dispense: 90 tablet; Refill: 0 - oxyCODONE-acetaminophen (PERCOCET) 10-325 MG tablet; Take 1 tablet by mouth every 8 (eight) hours as needed for pain.  Dispense: 90 tablet; Refill: 0   Start time: 9:58 AM End time: 10:06 AM  Meds ordered this encounter  Medications  . DISCONTD: oxyCODONE-acetaminophen (PERCOCET) 10-325 MG tablet    Sig: Take 1 tablet by mouth every 8 (eight) hours as needed for pain.    Dispense:  90 tablet    Refill:  0    Fill 60 days from original script date    Order Specific Question:   Supervising Provider    Answer:   Janora Norlander [[id number]]  . DISCONTD: oxyCODONE-acetaminophen (PERCOCET) 10-325 MG tablet    Sig: Take 1 tablet by mouth every 8 (eight) hours as needed for pain.    Dispense:  90 tablet    Refill:  0    Fill 30 days from original script date    Order Specific Question:   Supervising Provider    Answer:   Janora Norlander [[id number]]  . DISCONTD: oxyCODONE-acetaminophen (PERCOCET) 10-325 MG tablet    Sig: Take 1 tablet by mouth every 8 (eight) hours as needed for pain.    Dispense:  90 tablet    Refill:  0    Order Specific Question:   Supervising Provider    Answer:   Janora Norlander [[id number]]  . SUMAtriptan (IMITREX) 50 MG tablet    Sig: Take 1 tablet (50 mg total) by mouth every 2 (two) hours as needed for migraine. May repeat in 2 hours if headache persists or recurs.    Dispense:  10 tablet    Refill:  5    Order Specific Question:   Supervising Provider    Answer:   Janora Norlander [[id number]]  . DISCONTD: DULoxetine (CYMBALTA) 30 MG capsule    Sig: Take 1-2 capsules (30-60 mg total) by mouth daily.    Dispense:  60 capsule    Refill:  5    Order Specific Question:   Supervising Provider    Answer:   Janora Norlander [[id number]]  . DULoxetine (CYMBALTA) 30 MG capsule    Sig: Take 1-2 capsules (30-60 mg total) by mouth daily.    Dispense:  60  capsule    Refill:  5    Order Specific Question:   Supervising Provider    Answer:   Janora Norlander [[id number]]  . oxyCODONE-acetaminophen (PERCOCET) 10-325 MG tablet    Sig: Take 1 tablet by mouth every 8 (eight) hours as needed for pain.    Dispense:  90 tablet    Refill:  0    Fill 60 days from original script date    Order Specific Question:   Supervising Provider    Answer:   Janora Norlander [[id number]]  . oxyCODONE-acetaminophen (PERCOCET) 10-325 MG tablet    Sig: Take 1 tablet  by mouth every 8 (eight) hours as needed for pain.    Dispense:  90 tablet    Refill:  0    Fill 30 days from original script date    Order Specific Question:   Supervising Provider    Answer:   Raliegh IpGOTTSCHALK, ASHLY M [[id number]]  . oxyCODONE-acetaminophen (PERCOCET) 10-325 MG tablet    Sig: Take 1 tablet by mouth every 8 (eight) hours as needed for pain.    Dispense:  90 tablet    Refill:  0    Order Specific Question:   Supervising Provider    Answer:   Raliegh IpGOTTSCHALK, ASHLY M [[id number]]    Prudy FeelerAngel Milee Qualls PA-C [facility] [phone]

## 2019-01-29 ENCOUNTER — Other Ambulatory Visit: Payer: Self-pay | Admitting: Physician Assistant

## 2019-01-29 DIAGNOSIS — M5136 Other intervertebral disc degeneration, lumbar region: Secondary | ICD-10-CM

## 2019-03-01 ENCOUNTER — Encounter: Payer: Self-pay | Admitting: Physician Assistant

## 2019-03-01 ENCOUNTER — Ambulatory Visit (INDEPENDENT_AMBULATORY_CARE_PROVIDER_SITE_OTHER): Payer: Medicaid Other | Admitting: Physician Assistant

## 2019-03-01 DIAGNOSIS — N938 Other specified abnormal uterine and vaginal bleeding: Secondary | ICD-10-CM

## 2019-03-01 DIAGNOSIS — M5136 Other intervertebral disc degeneration, lumbar region: Secondary | ICD-10-CM

## 2019-03-01 DIAGNOSIS — J301 Allergic rhinitis due to pollen: Secondary | ICD-10-CM

## 2019-03-01 MED ORDER — LORATADINE 10 MG PO TABS: 10.0000 mg | ORAL_TABLET | Freq: Every day | ORAL | 11 refills | Status: DC

## 2019-03-01 MED ORDER — OXYCODONE-ACETAMINOPHEN 10-325 MG PO TABS: 1.0000 | ORAL_TABLET | Freq: Three times a day (TID) | ORAL | 0 refills | Status: DC | PRN

## 2019-03-01 MED ORDER — FLUTICASONE PROPIONATE 50 MCG/ACT NA SUSP: 1.0000 | Freq: Two times a day (BID) | NASAL | 6 refills | Status: DC

## 2019-03-01 MED ORDER — DESOGESTREL-ETHINYL ESTRADIOL 0.15-0.02/0.01 MG (21/5) PO TABS: ORAL_TABLET | ORAL | 11 refills | Status: DC

## 2019-03-01 NOTE — Progress Notes (Signed)
Telephone visit  Subjective: CC: Recheck on chronic pain medicines and allergy, congestion PCP: Remus Loffler, PA-C BJS:EGBTD Sandra Carroll is a 53 y.o. female calls for telephone consult today. Patient provides verbal consent for consult held via phone.  Patient is identified with 2 separate identifiers.  At this time the entire area is on COVID-19 social distancing and stay home orders are in place.  Patient is of higher risk and therefore we are performing this by a virtual method.  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 There is copious drainage at times. Denies any fever at this time. There has been a history of sinus infections in the past.  There is cough at night.  She states that when she is wearing her mask all day at work that she gets very congested.   PAIN ASSESSMENT: Cause of pain-degenerative disc disease  This patient returns for a 3 month recheck on narcotic use for the above named conditions  Current medications-oxycodone 10/325 1 every 8 hours as needed for pain Medication side effects-none Any concerns-no  Pain on scale of 1-10-5 Frequency-Daily What increases pain-walking, standing, lifting What makes pain Better-rest Effects on ADL -mild Any change in general medical condition-no  Effectiveness of current meds-good Adverse reactions form pain meds-no PMP AWARE website reviewed: Yes Any suspicious activity on PMP Aware: No MME daily dose: 45  Contract on file 05/16/2018 Last UDS 08/27/2018  [facility] script sent or current  History of overdose or risk of abuse no  ROS: Per HPI  No Known Allergies Past Medical History:  Diagnosis Date  . Chronic back pain   . Complication of anesthesia 1991 and 1998   childbirth --reaction to anesthesia was itching.   . DDD (degenerative disc disease)    . Hemorrhoids   . Hyperlipidemia   . Hypertension   . PONV (postoperative nausea and vomiting)   . Renal disorder    kidney stones  . Shortness of breath    quit smoking two years ago and still has some sob with exertion     Current Outpatient Medications:  .  acetaminophen (TYLENOL) 325 MG tablet, Take 975-1,300 mg by mouth every 6 (six) hours as needed for headache., Disp: , Rfl:  .  albuterol (PROVENTIL HFA;VENTOLIN HFA) 108 (90 Base) MCG/ACT inhaler, Inhale 2 puffs into the lungs every 6 (six) hours as needed for wheezing or shortness of breath., Disp: 1 Inhaler, Rfl: 0 .  aspirin EC 81 MG tablet, Take 81 mg by mouth daily., Disp: , Rfl:  .  desogestrel-ethinyl estradiol (VIORELE) 0.15-0.02/0.01 MG (21/5) tablet, TAKE ONE (1) TABLET EACH DAY, Disp: 28 tablet, Rfl: 11 .  fluticasone (FLONASE) 50 MCG/ACT nasal spray, Place 1 spray into both nostrils 2 (two) times daily., Disp: 16 g, Rfl: 6 .  ibuprofen (ADVIL,MOTRIN) 200 MG tablet, Take 2 tablets (400 mg total) by mouth every 6 (six) hours as needed., Disp: 30 tablet, Rfl: 0 .  lisinopril (ZESTRIL) 20 MG tablet, TAKE ONE (1) TABLET EACH DAY, Disp: 30 tablet, Rfl: 11 .  loratadine (CLARITIN) 10 MG tablet, Take 1 tablet (10 mg total) by mouth daily., Disp: 30 tablet, Rfl: 11 .  oxyCODONE-acetaminophen (PERCOCET) 10-325 MG tablet, Take 1 tablet by mouth every 8 (eight) hours as needed for pain., Disp: 90 tablet, Rfl: 0 .  oxyCODONE-acetaminophen (  PERCOCET) 10-325 MG tablet, Take 1 tablet by mouth every 8 (eight) hours as needed for pain., Disp: 90 tablet, Rfl: 0 .  oxyCODONE-acetaminophen (PERCOCET) 10-325 MG tablet, Take 1 tablet by mouth every 8 (eight) hours as needed for pain., Disp: 90 tablet, Rfl: 0 .  simvastatin (ZOCOR) 20 MG tablet, Take 1 tablet (20 mg total) by mouth every evening., Disp: 30 tablet, Rfl: 11 .  SUMAtriptan (IMITREX) 50 MG tablet, Take 1 tablet (50 mg total) by mouth every 2 (two) hours as needed for migraine. May  repeat in 2 hours if headache persists or recurs., Disp: 10 tablet, Rfl: 5 .  tamsulosin (FLOMAX) 0.4 MG CAPS capsule, Take 2 capsules (0.8 mg total) by mouth daily., Disp: 30 capsule, Rfl: 11  Assessment/ Plan: 53 y.o. female   1. DDD (degenerative disc disease), lumbar - oxyCODONE-acetaminophen (PERCOCET) 10-325 MG tablet; Take 1 tablet by mouth every 8 (eight) hours as needed for pain.  Dispense: 90 tablet; Refill: 0 - oxyCODONE-acetaminophen (PERCOCET) 10-325 MG tablet; Take 1 tablet by mouth every 8 (eight) hours as needed for pain.  Dispense: 90 tablet; Refill: 0 - oxyCODONE-acetaminophen (PERCOCET) 10-325 MG tablet; Take 1 tablet by mouth every 8 (eight) hours as needed for pain.  Dispense: 90 tablet; Refill: 0  2. DUB (dysfunctional uterine bleeding) - desogestrel-ethinyl estradiol (VIORELE) 0.15-0.02/0.01 MG (21/5) tablet; TAKE ONE (1) TABLET EACH DAY  Dispense: 28 tablet; Refill: 11  3. Seasonal allergic rhinitis due to pollen - loratadine (CLARITIN) 10 MG tablet; Take 1 tablet (10 mg total) by mouth daily.  Dispense: 30 tablet; Refill: 11 - fluticasone (FLONASE) 50 MCG/ACT nasal spray; Place 1 spray into both nostrils 2 (two) times daily.  Dispense: 16 g; Refill: 6   No follow-ups on file.  Continue all other maintenance medications as listed above.  Start time: 10:12 AM End time: 10:24 AM  Meds ordered this encounter  Medications  . oxyCODONE-acetaminophen (PERCOCET) 10-325 MG tablet    Sig: Take 1 tablet by mouth every 8 (eight) hours as needed for pain.    Dispense:  90 tablet    Refill:  0    Fill 60 days from original script date    Order Specific Question:   Supervising Provider    Answer:   Janora Norlander [[id number]]  . oxyCODONE-acetaminophen (PERCOCET) 10-325 MG tablet    Sig: Take 1 tablet by mouth every 8 (eight) hours as needed for pain.    Dispense:  90 tablet    Refill:  0    Fill 30 days from original script date    Order Specific Question:    Supervising Provider    Answer:   Janora Norlander [[id number]]  . oxyCODONE-acetaminophen (PERCOCET) 10-325 MG tablet    Sig: Take 1 tablet by mouth every 8 (eight) hours as needed for pain.    Dispense:  90 tablet    Refill:  0    Order Specific Question:   Supervising Provider    Answer:   Janora Norlander [[id number]]  . desogestrel-ethinyl estradiol (VIORELE) 0.15-0.02/0.01 MG (21/5) tablet    Sig: TAKE ONE (1) TABLET EACH DAY    Dispense:  28 tablet    Refill:  11    Order Specific Question:   Supervising Provider    Answer:   Janora Norlander [[id number]]  . loratadine (CLARITIN) 10 MG tablet    Sig: Take 1 tablet (10 mg total) by mouth daily.    Dispense:  30 tablet  Refill:  11    Order Specific Question:   Supervising Provider    Answer:   GOTTSCHALK, ASHLY M [[id number]]  . fluticasone (FLONASE) 50 MCG/ACT nasal sRaliegh Ippray    Sig: Place 1 spray into both nostrils 2 (two) times daily.    Dispense:  16 g    Refill:  6    Order Specific Question:   Supervising Provider    Answer:   Raliegh IpGOTTSCHALK, ASHLY M [[id number]]    Prudy FeelerAngel Aurelio Mccamy PA-C [facility] [phone]

## 2019-04-08 ENCOUNTER — Other Ambulatory Visit: Payer: Self-pay

## 2019-04-08 ENCOUNTER — Emergency Department (HOSPITAL_COMMUNITY)
Admission: EM | Admit: 2019-04-08 | Discharge: 2019-04-08 | Disposition: A | Discharge status: Home / Self Care | Payer: Managed Care, Other (non HMO) | Attending: Emergency Medicine | Admitting: Emergency Medicine

## 2019-04-08 ENCOUNTER — Encounter (HOSPITAL_COMMUNITY): Payer: Self-pay | Admitting: Emergency Medicine

## 2019-04-08 ENCOUNTER — Emergency Department (HOSPITAL_COMMUNITY): Payer: Managed Care, Other (non HMO)

## 2019-04-08 DIAGNOSIS — Z87891 Personal history of nicotine dependence: Secondary | ICD-10-CM | POA: Insufficient documentation

## 2019-04-08 DIAGNOSIS — I10 Essential (primary) hypertension: Secondary | ICD-10-CM | POA: Diagnosis not present

## 2019-04-08 DIAGNOSIS — M79672 Pain in left foot: Secondary | ICD-10-CM

## 2019-04-08 DIAGNOSIS — Z79899 Other long term (current) drug therapy: Secondary | ICD-10-CM | POA: Insufficient documentation

## 2019-04-08 DIAGNOSIS — Z7982 Long term (current) use of aspirin: Secondary | ICD-10-CM | POA: Diagnosis not present

## 2019-04-08 MED ORDER — PREDNISONE 10 MG (21) PO TBPK: ORAL_TABLET | Freq: Every day | ORAL | 0 refills | Status: DC

## 2019-04-08 NOTE — Discharge Instructions (Signed)
1. Medications: Take steroid taper as prescribed with food to avoid upset stomach issues.  Do not take ibuprofen, Advil, Aleve, or Motrin while taking this medicine.  You can continue taking your oxycodone as prescribed.  Do not drive, drink alcohol, operate heavy machinery while taking this medication as it can make you drowsy. 2. Treatment: rest, ice, elevate and use brace, drink plenty of fluids, gentle stretching; wear compression stockings to help with swelling and pain 3. Follow Up: Please followup with orthopedics or podiatry as directed or your PCP in 1 week if no improvement for discussion of your diagnoses and further evaluation after today's visit; if you do not have a primary care doctor use the resource guide provided to find one; Please return to the ER for worsening symptoms or other concerns such as worsening swelling, redness of the skin, fevers, loss of pulses, or loss of feeling

## 2019-04-08 NOTE — ED Provider Notes (Signed)
[facility] EMERGENCY DEPARTMENT Provider Note   CSN: [id number] Arrival date & time: 04/08/19  1536     History   Chief Complaint Chief Complaint  Patient presents with   Foot Pain    HPI Sandra Carroll is a 53 y.o. female with history of degenerative disc disease, hypertension, hyperlipidemia presents for evaluation of acute onset, progressively worsening left foot pain for 1 month.  She reports that symptoms began after a wheel from a "buggy"grazed against the lateral aspect of her left foot constant, throbbing and aching, worsens with prolonged ambulation primarily at work, improves with application of ice.  Has also tried Epsom salt soaks, Tylenol, oxycodone without relief of symptoms.  Notes some swelling to the lateral aspect of the foot.  Denies fevers. She states "I think I just need a boot".      The history is provided by the patient.    Past Medical History:  Diagnosis Date   Chronic back pain    Complication of anesthesia 1991 and 1998   childbirth --reaction to anesthesia was itching.    DDD (degenerative disc disease)    Hemorrhoids    Hyperlipidemia    Hypertension    PONV (postoperative nausea and vomiting)    Renal disorder    kidney stones   Shortness of breath    quit smoking two years ago and still has some sob with exertion     Patient Active Problem List   Diagnosis Date Noted   History of kidney stones 08/28/2018   Bursitis of right hip 07/17/2017   Sciatica of right side 01/03/2017   DDD (degenerative disc disease), lumbar 10/04/2016   Essential hypertension 10/04/2016   Pure hypercholesterolemia 10/04/2016   DUB (dysfunctional uterine bleeding) 10/04/2016   Internal hemorrhoids 07/25/2014   Upper abdominal pain 07/25/2014   Nausea and vomiting 07/25/2014   Rash 07/25/2014   Enteritis 07/25/2014   Internal hemorrhoids with other complication 06/25/2013   Rectal bleeding 02/16/2013   Constipation 02/16/2013     Past Surgical History:  Procedure Laterality Date   ADENOIDECTOMY  1978   CESAREAN SECTION  1991 1998   pt had itching with 1991 c-section.    COLONOSCOPY N/A 03/01/2013   Dr. Jena Gauss- normal rectum except for anal canal/internal hemorrhoidal tags. colonic mucosa appeared normal.   KNEE ARTHROSCOPY  2003   LITHOTRIPSY     LUMBAR LAMINECTOMY/DECOMPRESSION MICRODISCECTOMY  03/02/2012   Procedure: LUMBAR LAMINECTOMY/DECOMPRESSION MICRODISCECTOMY 1 LEVEL;  Surgeon: Carmela Hurt, MD;  Location: [facility];  Service: Neurosurgery;  Laterality: Left;  LEFT Lumbar Five-Sacral One Laminectomy for Decompression      OB History   No obstetric history on file.      Home Medications    Prior to Admission medications   Medication Sig Start Date End Date Taking? Authorizing Provider  acetaminophen (TYLENOL) 325 MG tablet Take 975-1,300 mg by mouth every 6 (six) hours as needed for headache.    [provider]  albuterol (PROVENTIL HFA;VENTOLIN HFA) 108 (90 Base) MCG/ACT inhaler Inhale 2 puffs into the lungs every 6 (six) hours as needed for wheezing or shortness of breath. 10/20/17   Remus Loffler, PA-C  aspirin EC 81 MG tablet Take 81 mg by mouth daily.    [provider]  desogestrel-ethinyl estradiol (VIORELE) 0.15-0.02/0.01 MG (21/5) tablet TAKE ONE (1) TABLET EACH DAY 03/01/19   Remus Loffler, PA-C  fluticasone (FLONASE) 50 MCG/ACT nasal spray Place 1 spray into both nostrils 2 (two)  times daily. 03/01/19   Remus LofflerJones, Angel S, PA-C  ibuprofen (ADVIL,MOTRIN) 200 MG tablet Take 2 tablets (400 mg total) by mouth every 6 (six) hours as needed. 08/27/18   Remus LofflerJones, Angel S, PA-C  lisinopril (ZESTRIL) 20 MG tablet TAKE ONE (1) TABLET EACH DAY 11/29/18   Remus LofflerJones, Angel S, PA-C  loratadine (CLARITIN) 10 MG tablet Take 1 tablet (10 mg total) by mouth daily. 03/01/19   Remus LofflerJones, Angel S, PA-C  oxyCODONE-acetaminophen (PERCOCET) 10-325 MG tablet Take 1 tablet by mouth every 8 (eight) hours as  needed for pain. 03/01/19   Remus LofflerJones, Angel S, PA-C  oxyCODONE-acetaminophen (PERCOCET) 10-325 MG tablet Take 1 tablet by mouth every 8 (eight) hours as needed for pain. 03/01/19   Remus LofflerJones, Angel S, PA-C  oxyCODONE-acetaminophen (PERCOCET) 10-325 MG tablet Take 1 tablet by mouth every 8 (eight) hours as needed for pain. 03/01/19   Remus LofflerJones, Angel S, PA-C  predniSONE (STERAPRED UNI-PAK 21 TAB) 10 MG (21) TBPK tablet Take by mouth daily. Take 6 tabs by mouth daily  for 2 days, then 5 tabs for 2 days, then 4 tabs for 2 days, then 3 tabs for 2 days, 2 tabs for 2 days, then 1 tab by mouth daily for 2 days 04/08/19   Michela PitcherFawze, Clemie General A, PA-C  simvastatin (ZOCOR) 20 MG tablet Take 1 tablet (20 mg total) by mouth every evening. 08/27/18   Remus LofflerJones, Angel S, PA-C  SUMAtriptan (IMITREX) 50 MG tablet Take 1 tablet (50 mg total) by mouth every 2 (two) hours as needed for migraine. May repeat in 2 hours if headache persists or recurs. 11/28/18   Remus LofflerJones, Angel S, PA-C  tamsulosin (FLOMAX) 0.4 MG CAPS capsule Take 2 capsules (0.8 mg total) by mouth daily. 08/27/18   Remus LofflerJones, Angel S, PA-C    Family History Family History  Problem Relation Age of Onset   Hypertension Mother    Cancer Father    Hypertension Father    Colon cancer Neg Hx     Social History Social History   Tobacco Use   Smoking status: Former Smoker    Quit date: 2010    Years since quitting: 10.8   Smokeless tobacco: Never Used  Substance Use Topics   Alcohol use: No   Drug use: No     Allergies   Patient has no known allergies.   Review of Systems Review of Systems  Constitutional: Negative for chills and fever.  Musculoskeletal: Positive for arthralgias.     Physical Exam Updated Vital Signs BP 122/87    Pulse 82    Temp 97.8 F (36.6 C) (Oral)    Resp 16    LMP 04/05/2019    SpO2 98%   Physical Exam Vitals signs and nursing note reviewed.  Constitutional:      General: She is not in acute distress.    Appearance: She is  well-developed.  HENT:     Head: Normocephalic and atraumatic.  Eyes:     General:        Right eye: No discharge.        Left eye: No discharge.     Conjunctiva/sclera: Conjunctivae normal.  Neck:     Vascular: No JVD.     Trachea: No tracheal deviation.  Cardiovascular:     Rate and Rhythm: Normal rate.     Pulses: Normal pulses.     Comments: 2+ DP/PT pulses bilaterally.  Denna HaggardHomans' sign absent bilaterally. Pulmonary:     Effort: Pulmonary effort is normal.  Abdominal:     General: There is no distension.  Musculoskeletal:        General: Tenderness present.     Comments: Tenderness to palpation of the lateral aspect of the left foot along the mid-foot; trace swelling to this area.  No ecchymosis, erythema or warmth.  5/5 strength of BLE major muscle groups including with plantarflexion, dorsiflexion, inversion, and eversion of the left foot.  Skin:    General: Skin is warm and dry.     Findings: No erythema.  Neurological:     Mental Status: She is alert.     Comments: Fluent speech, no facial droop, sensation intact to light touch of bilateral lower extremities.  Ambulates with an antalgic gait but exhibits good balance.  Psychiatric:        Behavior: Behavior normal.      ED Treatments / Results  Labs (all labs ordered are listed, but only abnormal results are displayed) Labs Reviewed - No data to display  EKG None  Radiology Dg Foot Complete Left  Result Date: 04/08/2019 CLINICAL DATA:  Pain, ran over foot with we will shopping cart 1 month ago EXAM: LEFT FOOT - COMPLETE 3+ VIEW COMPARISON:  None. FINDINGS: No fracture or dislocation of the left foot. Mild midfoot arthrosis. Joint spaces are otherwise well preserved. Mild soft tissue edema about the forefoot. Small plantar and Achilles calcaneal spurs. IMPRESSION: 1. No fracture or dislocation of the left foot. 2. Mild midfoot arthrosis. Joint spaces are otherwise well preserved. 3.  Mild soft tissue edema about the  forefoot. Electronically Signed   By: Eddie Candle M.D.   On: 04/08/2019 16:55    Procedures Procedures (including critical care time)  Medications Ordered in ED Medications - No data to display   Initial Impression / Assessment and Plan / ED Course  I have reviewed the triage vital signs and the nursing notes.  Pertinent labs & imaging results that were available during my care of the patient were reviewed by me and considered in my medical decision making (see chart for details).        Patient with left foot pain for 1 month after injury.  She is afebrile, initially tachycardic on triage but was not tachycardic with me, vital signs normalized on reevaluation.  She is nontoxic in appearance.  Neurovascularly intact and ambulatory without difficulty despite pain.  Radiographs show no fracture or dislocation.  She does have mild midfoot arthrosis and mild soft tissue edema about the forefoot.  No evidence of secondary skin infection, necrotizing fasciitis, septic arthritis, or osteomyelitis.  Conservative therapy indicated and discussed with patient.  Review of [place] controlled substance Registry reveals that she is prescribed chronic narcotics with her PCP.  Offered CAM Walker, prednisone taper, recommended compression stockings, elevation, application of ice and follow-up with orthopedist or podiatrist for reevaluation of symptoms.  Discussed strict ED return precautions. Patient verbalized understanding of and agreement with plan and is safe for discharge home at this time.   Final Clinical Impressions(s) / ED Diagnoses   Final diagnoses:  Acute foot pain, left    ED Discharge Orders         Ordered    predniSONE (STERAPRED UNI-PAK 21 TAB) 10 MG (21) TBPK tablet  Daily     04/08/19 2019           Renita Papa, PA-C 04/08/19 2036    Margette Fast, MD 04/09/19 1246

## 2019-04-08 NOTE — ED Triage Notes (Signed)
Pain to LT foot since she ran over her foot with the wheel of a shopping cart x 1 month ago

## 2019-04-08 NOTE — ED Notes (Signed)
Pt reports a month old injury to her L foot after a mop bucket hit it   She reports foot is painful to touch   Her xrays are complete and read

## 2019-05-30 ENCOUNTER — Other Ambulatory Visit: Payer: Self-pay

## 2019-05-31 ENCOUNTER — Encounter: Payer: Self-pay | Admitting: Physician Assistant

## 2019-05-31 ENCOUNTER — Ambulatory Visit (INDEPENDENT_AMBULATORY_CARE_PROVIDER_SITE_OTHER): Payer: Managed Care, Other (non HMO) | Admitting: Physician Assistant

## 2019-05-31 VITALS — BP 126/80 | HR 84 | Temp 99.1°F | Resp 20 | Ht 62.0 in | Wt 213.0 lb

## 2019-05-31 DIAGNOSIS — Z87442 Personal history of urinary calculi: Secondary | ICD-10-CM

## 2019-05-31 DIAGNOSIS — M5136 Other intervertebral disc degeneration, lumbar region: Secondary | ICD-10-CM | POA: Diagnosis not present

## 2019-05-31 DIAGNOSIS — F320 Major depressive disorder, single episode, mild: Secondary | ICD-10-CM | POA: Diagnosis not present

## 2019-05-31 MED ORDER — OXYCODONE-ACETAMINOPHEN 10-325 MG PO TABS: 1.0000 | ORAL_TABLET | Freq: Three times a day (TID) | ORAL | 0 refills | Status: DC | PRN

## 2019-05-31 MED ORDER — TAMSULOSIN HCL 0.4 MG PO CAPS: 0.8000 mg | ORAL_CAPSULE | Freq: Every day | ORAL | 11 refills | Status: DC

## 2019-05-31 MED ORDER — CITALOPRAM HYDROBROMIDE 10 MG PO TABS: 10.0000 mg | ORAL_TABLET | Freq: Every day | ORAL | 1 refills | Status: DC

## 2019-05-31 NOTE — Progress Notes (Signed)
BP 126/80   Pulse 84   Temp 99.1 F (37.3 C)   Resp 20   Ht 5\' 2"  (1.575 m)   Wt 213 lb (96.6 kg)   LMP 03/31/2019 (Approximate) Comment: irreg.  SpO2 97%   BMI 38.96 kg/m    Subjective:    Patient ID: Sandra Carroll, female    DOB: 09/01/1965, 53 y.o.   MRN: [id number]  HPI: Sandra Carroll is a 53 y.o. female presenting on 05/31/2019 for Medical Management of Chronic Issues (3 mo / refills)  PAIN ASSESSMENT: Cause of pain-degenerative disc disease 1.  Soft disc protrusion in the left lateral recess and left neural foramen at L5-S1 compressing the left L5 and S1 nerves. 2.  Small soft disc protrusion into the right lateral recess at L5- S1 without neural impingement. 3.  Right hydronephrosis, new since 12/16/2010.  The right ureter is dilated into the pelvis.  Site and cause of obstruction is not visible on this exam.  This patient returns for a 3 month recheck on narcotic use for the above named conditions  Current medications-oxycodone 10/325 1 every 8 hours as needed for pain Medication side effects-none Any concerns-no  Pain on scale of 1-10-5 Frequency-Daily What increases pain-walking, standing, lifting What makes pain Better-rest Effects on ADL -mild Any change in general medical condition-no  Effectiveness of current meds-good Adverse reactions form pain meds-no PMP AWARE website reviewed:Yes Any suspicious activity on PMP Aware:No MME daily dose:45  Contract on file11/27/2019 Last UDS3/02/2019  [facility]  [facility] 05/31/2019 08/27/2018 05/11/2018 02/08/2018 10/20/2017  Decreased Interest 2 0 0 0 0  Down, Depressed, Hopeless 3 0 0 0 0  PHQ - 2 Score 5 0 0 0 0  Altered sleeping 0 - - - -  Tired, decreased energy 2 - - - -  Change in appetite 1 - - - -  Feeling bad or failure about yourself  3 - - - -  Trouble concentrating 0 - - - -  Moving slowly or fidgety/restless 0 - - - -  Suicidal thoughts 0 - -  - -  PHQ-9 Score 11 - - - -  Difficult doing work/chores Somewhat difficult - - - -      Past Medical History:  Diagnosis Date  . Chronic back pain   . Complication of anesthesia 1991 and 1998   childbirth --reaction to anesthesia was itching.   . DDD (degenerative disc disease)   . Hemorrhoids   . Hyperlipidemia   . Hypertension   . PONV (postoperative nausea and vomiting)   . Renal disorder    kidney stones  . Shortness of breath    quit smoking two years ago and still has some sob with exertion    Relevant past medical, surgical, family and social history reviewed and updated as indicated. Interim medical history since our last visit reviewed. Allergies and medications reviewed and updated. DATA REVIEWED: CHART IN EPIC  Family History reviewed for pertinent findings.  Review of Systems  Constitutional: Negative.   HENT: Negative.   Eyes: Negative.   Respiratory: Negative.   Gastrointestinal: Negative.   Genitourinary: Negative.     Allergies as of 05/31/2019   No Known Allergies     Medication List       Accurate as of May 31, 2019 10:37 AM. If you have any questions, ask your nurse or doctor.        STOP taking these medications  albuterol 108 (90 Base) MCG/ACT inhaler Commonly known as: VENTOLIN HFA Stopped by: Terald Sleeper, PA-C   fluticasone 50 MCG/ACT nasal spray Commonly known as: FLONASE Stopped by: Terald Sleeper, PA-C   loratadine 10 MG tablet Commonly known as: CLARITIN Stopped by: Terald Sleeper, PA-C   predniSONE 10 MG (21) Tbpk tablet Commonly known as: STERAPRED UNI-PAK 21 TAB Stopped by: Terald Sleeper, PA-C   tamsulosin 0.4 MG Caps capsule Commonly known as: Flomax Stopped by: Terald Sleeper, PA-C     TAKE these medications   acetaminophen 325 MG tablet Commonly known as: TYLENOL Take 975-1,300 mg by mouth every 6 (six) hours as needed for headache.   aspirin EC 81 MG tablet Take 81 mg by mouth daily.    desogestrel-ethinyl estradiol 0.15-0.02/0.01 MG (21/5) tablet Commonly known as: Viorele TAKE ONE (1) TABLET EACH DAY   ibuprofen 200 MG tablet Commonly known as: ADVIL Take 2 tablets (400 mg total) by mouth every 6 (six) hours as needed.   lisinopril 20 MG tablet Commonly known as: ZESTRIL TAKE ONE (1) TABLET EACH DAY   oxyCODONE-acetaminophen 10-325 MG tablet Commonly known as: PERCOCET Take 1 tablet by mouth every 8 (eight) hours as needed for pain.   oxyCODONE-acetaminophen 10-325 MG tablet Commonly known as: Percocet Take 1 tablet by mouth every 8 (eight) hours as needed for pain.   oxyCODONE-acetaminophen 10-325 MG tablet Commonly known as: PERCOCET Take 1 tablet by mouth every 8 (eight) hours as needed for pain.   simvastatin 20 MG tablet Commonly known as: ZOCOR Take 1 tablet (20 mg total) by mouth every evening.   SUMAtriptan 50 MG tablet Commonly known as: Imitrex Take 1 tablet (50 mg total) by mouth every 2 (two) hours as needed for migraine. May repeat in 2 hours if headache persists or recurs.          Objective:    BP 126/80   Pulse 84   Temp 99.1 F (37.3 C)   Resp 20   Ht 5\' 2"  (1.575 m)   Wt 213 lb (96.6 kg)   LMP 03/31/2019 (Approximate) Comment: irreg.  SpO2 97%   BMI 38.96 kg/m   No Known Allergies  Wt Readings from Last 3 Encounters:  05/31/19 213 lb (96.6 kg)  08/27/18 198 lb 3.2 oz (89.9 kg)  05/11/18 205 lb 12.8 oz (93.4 kg)    Physical Exam Constitutional:      General: She is not in acute distress.    Appearance: Normal appearance. She is well-developed.  HENT:     Head: Normocephalic and atraumatic.  Cardiovascular:     Rate and Rhythm: Normal rate.  Pulmonary:     Effort: Pulmonary effort is normal.  Skin:    General: Skin is warm and dry.     Findings: No rash.  Neurological:     Mental Status: She is alert and oriented to person, place, and time.     Deep Tendon Reflexes: Reflexes are normal and symmetric.      Results for orders placed or performed in visit on 08/27/18  ToxASSURE Select 13 (MW), Urine  Result Value Ref Range   Summary FINAL       Assessment & Plan:   1. DDD (degenerative disc disease), lumbar - oxyCODONE-acetaminophen (PERCOCET) 10-325 MG tablet; Take 1 tablet by mouth every 8 (eight) hours as needed for pain.  Dispense: 90 tablet; Refill: 0 - oxyCODONE-acetaminophen (PERCOCET) 10-325 MG tablet; Take 1 tablet by mouth every 8 (  eight) hours as needed for pain.  Dispense: 90 tablet; Refill: 0 - oxyCODONE-acetaminophen (PERCOCET) 10-325 MG tablet; Take 1 tablet by mouth every 8 (eight) hours as needed for pain.  Dispense: 90 tablet; Refill: 0  2. Depression, major, single episode, mild (HCC) - citalopram (CELEXA) 10 MG tablet; Take 1 tablet (10 mg total) by mouth daily.  Dispense: 30 tablet; Refill: 1  3. History of kidney stones - tamsulosin (FLOMAX) 0.4 MG CAPS capsule; Take 2 capsules (0.8 mg total) by mouth daily.  Dispense: 60 capsule; Refill: 11    Continue all other maintenance medications as listed above.  Follow up plan: No follow-ups on file.  Educational handout given for stress  Remus Loffler PA-C [facility] [address]  [place], [place] [address] [phone]   05/31/2019, 10:37 AM

## 2019-05-31 NOTE — Patient Instructions (Signed)

## 2019-07-01 ENCOUNTER — Telehealth: Payer: Self-pay | Admitting: *Deleted

## 2019-07-01 ENCOUNTER — Telehealth: Payer: Self-pay | Admitting: Physician Assistant

## 2019-07-01 NOTE — Telephone Encounter (Signed)
Prior Auth for oxycodone-acetaminophen 10/325mg -In Process  Opioid form filled out, signed and faxed to [facility]

## 2019-07-01 NOTE — Telephone Encounter (Signed)
What is the name of the medication? Percocet  Have you contacted your pharmacy to request a refill? Yes  Which pharmacy would you like this sent to? [facility]   Patient notified that their request is being sent to the clinical staff for review and that they should receive a call once it is complete. If they do not receive a call within 24 hours they can check with their pharmacy or our office.   Please call pt bc she goes to 2 different Pharmacies bc of cost.  B/p meds & chlestrol meds sent into [facility] in [place].  Also, she has an appt this Friday.

## 2019-07-01 NOTE — Telephone Encounter (Signed)
LMOVM Percocet was sent in 3 Rx's to [facility] pharmacy on 05/31/19. Simvastatin & BP med was not sent in. These chronic conditions were not sent in & not discussed during the last visit & bloodwork has not been done in the last year.

## 2019-07-02 NOTE — Telephone Encounter (Signed)
Spoke with Stew from [facility] who said that patient has [employer], so most likely the Rx for Oxycodone will be denied. Wanted to let us know. He said he would try and contact the patient to let her know.

## 2019-07-05 ENCOUNTER — Ambulatory Visit (INDEPENDENT_AMBULATORY_CARE_PROVIDER_SITE_OTHER): Payer: Managed Care, Other (non HMO) | Admitting: Physician Assistant

## 2019-07-05 ENCOUNTER — Encounter: Payer: Self-pay | Admitting: Physician Assistant

## 2019-07-05 DIAGNOSIS — F320 Major depressive disorder, single episode, mild: Secondary | ICD-10-CM | POA: Insufficient documentation

## 2019-07-05 DIAGNOSIS — F419 Anxiety disorder, unspecified: Secondary | ICD-10-CM | POA: Diagnosis not present

## 2019-07-05 DIAGNOSIS — R4586 Emotional lability: Secondary | ICD-10-CM | POA: Insufficient documentation

## 2019-07-05 MED ORDER — CITALOPRAM HYDROBROMIDE 20 MG PO TABS: 10.0000 mg | ORAL_TABLET | Freq: Every day | ORAL | 2 refills | Status: DC

## 2019-07-05 MED ORDER — RISPERIDONE 0.5 MG PO TABS: 0.5000 mg | ORAL_TABLET | Freq: Every day | ORAL | 2 refills | Status: DC

## 2019-07-05 NOTE — Progress Notes (Signed)
Telephone visit  Subjective: Sandra Carroll, anxiety, mood changes PCP: Remus Loffler, PA-C Sandra Carroll is a 54 y.o. female calls for telephone consult today. Patient provides verbal consent for consult held via phone.  Patient is identified with 2 separate identifiers.  At this time the entire area is on COVID-19 social distancing and stay home orders are in place.  Patient is of higher risk and therefore we are performing this by a virtual method.  Location of patient: home Location of provider: [facility] Others present for call: no  This patient several 4-week recheck on her depression anxiety.  She is very tearful and crying on the phone and states that she just has a lot of stressors around her.  She denies any chest pain or shortness of breath.  She just feels over only depressed and down.  Blood she has had suicidal thoughts but, contracts for safety.  And I have encouraged her that if we are changing the medication she can possibly have bad side effects and she should stop the medicine and go to the emergency room if needed  Depression screen [facility] 2/9 07/05/2019 05/31/2019 08/27/2018 05/11/2018 02/08/2018  Decreased Interest 2 2 0 0 0  Down, Depressed, Hopeless 3 3 0 0 0  PHQ - 2 Score 5 5 0 0 0  Altered sleeping 2 0 - - -  Tired, decreased energy 3 2 - - -  Change in appetite 1 1 - - -  Feeling bad or failure about yourself  2 3 - - -  Trouble concentrating - 0 - - -  Moving slowly or fidgety/restless 1 0 - - -  Suicidal thoughts 1 0 - - -  PHQ-9 Score 15 11 - - -  Difficult doing work/chores Very difficult Somewhat difficult - - -   GAD 7 : Generalized Anxiety Score 07/05/2019 05/31/2019  Nervous, Anxious, on Edge 3 1  Control/stop worrying 3 3  Worry too much - different things 2 3  Trouble relaxing 2 3  Restless 0 2  Easily annoyed or irritable 3 1  Afraid - awful might happen 1 3  Total GAD 7 Score 14 16  Anxiety Difficulty Somewhat difficult Somewhat  difficult       ROS: Per HPI  No Known Allergies Past Medical History:  Diagnosis Date  . Chronic back pain   . Complication of anesthesia 1991 and 1998   childbirth --reaction to anesthesia was itching.   . DDD (degenerative disc disease)   . Hemorrhoids   . Hyperlipidemia   . Hypertension   . PONV (postoperative nausea and vomiting)   . Renal disorder    kidney stones  . Shortness of breath    quit smoking two years ago and still has some sob with exertion     Current Outpatient Medications:  .  acetaminophen (TYLENOL) 325 MG tablet, Take 975-1,300 mg by mouth every 6 (six) hours as needed for headache., Disp: , Rfl:  .  aspirin EC 81 MG tablet, Take 81 mg by mouth daily., Disp: , Rfl:  .  citalopram (CELEXA) 20 MG tablet, Take 0.5 tablets (10 mg total) by mouth daily., Disp: 30 tablet, Rfl: 2 .  desogestrel-ethinyl estradiol (VIORELE) 0.15-0.02/0.01 MG (21/5) tablet, TAKE ONE (1) TABLET EACH DAY, Disp: 28 tablet, Rfl: 11 .  ibuprofen (ADVIL,MOTRIN) 200 MG tablet, Take 2 tablets (400 mg total) by mouth every 6 (six) hours as needed., Disp: 30 tablet, Rfl: 0 .  lisinopril (ZESTRIL)  20 MG tablet, TAKE ONE (1) TABLET EACH DAY, Disp: 30 tablet, Rfl: 11 .  oxyCODONE-acetaminophen (PERCOCET) 10-325 MG tablet, Take 1 tablet by mouth every 8 (eight) hours as needed for pain., Disp: 90 tablet, Rfl: 0 .  oxyCODONE-acetaminophen (PERCOCET) 10-325 MG tablet, Take 1 tablet by mouth every 8 (eight) hours as needed for pain., Disp: 90 tablet, Rfl: 0 .  oxyCODONE-acetaminophen (PERCOCET) 10-325 MG tablet, Take 1 tablet by mouth every 8 (eight) hours as needed for pain., Disp: 90 tablet, Rfl: 0 .  risperiDONE (RISPERDAL) 0.5 MG tablet, Take 1 tablet (0.5 mg total) by mouth at bedtime., Disp: 30 tablet, Rfl: 2 .  simvastatin (ZOCOR) 20 MG tablet, Take 1 tablet (20 mg total) by mouth every evening., Disp: 30 tablet, Rfl: 11 .  SUMAtriptan (IMITREX) 50 MG tablet, Take 1 tablet (50 mg total) by  mouth every 2 (two) hours as needed for migraine. May repeat in 2 hours if headache persists or recurs. (Patient not taking: Reported on 05/31/2019), Disp: 10 tablet, Rfl: 5 .  tamsulosin (FLOMAX) 0.4 MG CAPS capsule, Take 2 capsules (0.8 mg total) by mouth daily., Disp: 60 capsule, Rfl: 11  Assessment/ Plan: 54 y.o. female   1. Depression, major, single episode, mild (HCC) - citalopram (CELEXA) 20 MG tablet; Take 0.5 tablets (10 mg total) by mouth daily.  Dispense: 30 tablet; Refill: 2 - risperiDONE (RISPERDAL) 0.5 MG tablet; Take 1 tablet (0.5 mg total) by mouth at bedtime.  Dispense: 30 tablet; Refill: 2  2. Anxiety - citalopram (CELEXA) 20 MG tablet; Take 0.5 tablets (10 mg total) by mouth daily.  Dispense: 30 tablet; Refill: 2  3. Mood change - citalopram (CELEXA) 20 MG tablet; Take 0.5 tablets (10 mg total) by mouth daily.  Dispense: 30 tablet; Refill: 2 - risperiDONE (RISPERDAL) 0.5 MG tablet; Take 1 tablet (0.5 mg total) by mouth at bedtime.  Dispense: 30 tablet; Refill: 2    Return in about 4 weeks (around 08/02/2019) for recheck medications.  Continue all other maintenance medications as listed above.  Start time: 1:53 PM End time: 2:11 PM  Meds ordered this encounter  Medications  . citalopram (CELEXA) 20 MG tablet    Sig: Take 0.5 tablets (10 mg total) by mouth daily.    Dispense:  30 tablet    Refill:  2    Order Specific Question:   Supervising Provider    Answer:   Janora Norlander [[id number]]  . risperiDONE (RISPERDAL) 0.5 MG tablet    Sig: Take 1 tablet (0.5 mg total) by mouth at bedtime.    Dispense:  30 tablet    Refill:  2    Order Specific Question:   Supervising Provider    Answer:   Janora Norlander [[id number]]    Particia Nearing PA-C [place] [phone]

## 2019-07-05 NOTE — Telephone Encounter (Signed)
Pt was made aware family planning Medicaid won't cover and she already paid cash and picked up medication.

## 2019-08-02 ENCOUNTER — Other Ambulatory Visit: Payer: Self-pay | Admitting: Physician Assistant

## 2019-08-02 ENCOUNTER — Ambulatory Visit: Payer: Managed Care, Other (non HMO) | Admitting: Physician Assistant

## 2019-08-02 DIAGNOSIS — M5136 Other intervertebral disc degeneration, lumbar region: Secondary | ICD-10-CM

## 2019-08-02 NOTE — Telephone Encounter (Signed)
Pt is requesting controlled medication. Please review.

## 2019-08-02 NOTE — Telephone Encounter (Signed)
Will you please refuse medication? Nurses are not allowed to.Marland Kitchen

## 2019-08-02 NOTE — Telephone Encounter (Signed)
On 05/31/19 note, scripts to cover 12/11, 1/10 and 2/9. There should be a script.  Assessment & Plan:    1. DDD (degenerative disc disease), lumbar - oxyCODONE-acetaminophen (PERCOCET) 10-325 MG tablet; Take 1 tablet by mouth every 8 (eight) hours as needed for pain.  Dispense: 90 tablet; Refill: 0 - oxyCODONE-acetaminophen (PERCOCET) 10-325 MG tablet; Take 1 tablet by mouth every 8 (eight) hours as needed for pain.  Dispense: 90 tablet; Refill: 0 - oxyCODONE-acetaminophen (PERCOCET) 10-325 MG tablet; Take 1 tablet by mouth every 8 (eight) hours as needed for pain.  Dispense: 90 tablet; Refill: 0   2. Depression, major, single episode, mild (HCC) - citalopram (CELEXA) 10 MG tablet; Take 1 tablet (10 mg total) by mouth daily.  Dispense: 30 tablet; Refill: 1   3. History of kidney stones - tamsulosin (FLOMAX) 0.4 MG CAPS capsule; Take 2 capsules (0.8 mg total) by mouth daily.  Dispense: 60 capsule; Refill: 11       Continue all other maintenance medications as listed above.   Follow up plan: No follow-ups on file.   Educational handout given for stress   Remus Loffler PA-C [facility] [address]  [place], [place] [address] [phone]     05/31/2019, 10:37 AM

## 2019-08-09 ENCOUNTER — Telehealth: Payer: Self-pay | Admitting: Physician Assistant

## 2019-08-09 NOTE — Telephone Encounter (Signed)
appt made patient aware 

## 2019-08-27 ENCOUNTER — Other Ambulatory Visit: Payer: Self-pay

## 2019-08-28 ENCOUNTER — Other Ambulatory Visit: Payer: Self-pay

## 2019-08-28 ENCOUNTER — Encounter: Payer: Self-pay | Admitting: Family

## 2019-08-28 ENCOUNTER — Ambulatory Visit (INDEPENDENT_AMBULATORY_CARE_PROVIDER_SITE_OTHER): Payer: Managed Care, Other (non HMO) | Admitting: Family

## 2019-08-28 ENCOUNTER — Ambulatory Visit: Payer: Managed Care, Other (non HMO) | Admitting: Physician Assistant

## 2019-08-28 VITALS — BP 117/78 | HR 83 | Temp 98.9°F | Ht 62.0 in | Wt 209.4 lb

## 2019-08-28 DIAGNOSIS — I10 Essential (primary) hypertension: Secondary | ICD-10-CM

## 2019-08-28 DIAGNOSIS — E7800 Pure hypercholesterolemia, unspecified: Secondary | ICD-10-CM | POA: Diagnosis not present

## 2019-08-28 DIAGNOSIS — F419 Anxiety disorder, unspecified: Secondary | ICD-10-CM | POA: Diagnosis not present

## 2019-08-28 DIAGNOSIS — M5136 Other intervertebral disc degeneration, lumbar region: Secondary | ICD-10-CM

## 2019-08-28 DIAGNOSIS — K5900 Constipation, unspecified: Secondary | ICD-10-CM

## 2019-08-28 DIAGNOSIS — N938 Other specified abnormal uterine and vaginal bleeding: Secondary | ICD-10-CM

## 2019-08-28 DIAGNOSIS — F1120 Opioid dependence, uncomplicated: Secondary | ICD-10-CM

## 2019-08-28 DIAGNOSIS — Z0289 Encounter for other administrative examinations: Secondary | ICD-10-CM

## 2019-08-28 DIAGNOSIS — F320 Major depressive disorder, single episode, mild: Secondary | ICD-10-CM

## 2019-08-28 MED ORDER — SIMVASTATIN 20 MG PO TABS: 20.0000 mg | ORAL_TABLET | Freq: Every evening | ORAL | 11 refills | Status: DC

## 2019-08-28 MED ORDER — OXYCODONE-ACETAMINOPHEN 10-325 MG PO TABS: 1.0000 | ORAL_TABLET | Freq: Three times a day (TID) | ORAL | 0 refills | Status: DC | PRN

## 2019-08-28 MED ORDER — DESOGESTREL-ETHINYL ESTRADIOL 0.15-0.02/0.01 MG (21/5) PO TABS: ORAL_TABLET | ORAL | 11 refills | Status: DC

## 2019-08-28 MED ORDER — LISINOPRIL 20 MG PO TABS: ORAL_TABLET | ORAL | 11 refills | Status: DC

## 2019-08-28 NOTE — Patient Instructions (Signed)

## 2019-08-28 NOTE — Progress Notes (Signed)
Subjective:    Patient ID: Sandra Carroll, female    DOB: 02-Jun-1966, 54 y.o.   MRN: [id number]  Chief Complaint  Patient presents with  . Medication Refill    Angel patient    Pt presents to the office today for chronic follow up.  Hypertension This is a chronic problem. The current episode started more than 1 year ago. The problem has been resolved since onset. The problem is controlled. Associated symptoms include anxiety and headaches. Pertinent negatives include no malaise/fatigue, peripheral edema or shortness of breath. Risk factors for coronary artery disease include dyslipidemia, obesity and sedentary lifestyle. The current treatment provides moderate improvement. There is no history of kidney disease, CAD/MI or heart failure.  Depression        This is a chronic problem.  The current episode started more than 1 year ago.   The onset quality is sudden.   The problem occurs intermittently.  The problem has been waxing and waning since onset.  Associated symptoms include irritable, restlessness, decreased interest, headaches and sad.  Associated symptoms include no helplessness and no hopelessness.  Past treatments include SSRIs - Selective serotonin reuptake inhibitors.  Past medical history includes anxiety.   Anxiety Presents for follow-up visit. Symptoms include depressed mood, excessive worry, irritability, nervous/anxious behavior, panic and restlessness. Patient reports no shortness of breath. Symptoms occur occasionally. The severity of symptoms is moderate. The quality of sleep is good.    Constipation This is a chronic problem. The current episode started more than 1 year ago. The problem has been waxing and waning since onset. Associated symptoms include back pain. She has tried laxatives for the symptoms. The treatment provided moderate relief.  Back Pain This is a chronic problem. The current episode started more than 1 year ago. The problem occurs intermittently. The  problem has been waxing and waning since onset. The pain is present in the lumbar spine. The quality of the pain is described as aching. The pain is at a severity of 10/10. The pain is moderate. Associated symptoms include headaches and perianal numbness. Pertinent negatives include no bladder incontinence or bowel incontinence. Risk factors include obesity. She has tried analgesics and bed rest for the symptoms. The treatment provided mild relief.  Hyperlipidemia This is a chronic problem. The current episode started more than 1 year ago. Exacerbating diseases include obesity. Pertinent negatives include no shortness of breath. Current antihyperlipidemic treatment includes statins. The current treatment provides moderate improvement of lipids.      Review of Systems  Constitutional: Positive for irritability. Negative for malaise/fatigue.  Respiratory: Negative for shortness of breath.   Gastrointestinal: Positive for constipation. Negative for bowel incontinence.  Genitourinary: Negative for bladder incontinence.  Musculoskeletal: Positive for back pain.  Neurological: Positive for headaches.  Psychiatric/Behavioral: Positive for depression. The patient is nervous/anxious.   All other systems reviewed and are negative.      Objective:   Physical Exam Vitals reviewed.  Constitutional:      General: She is irritable. She is not in acute distress.    Appearance: She is well-developed.  HENT:     Head: Normocephalic and atraumatic.     Right Ear: Tympanic membrane normal.     Left Ear: Tympanic membrane normal.  Eyes:     Pupils: Pupils are equal, round, and reactive to light.  Neck:     Thyroid: No thyromegaly.  Cardiovascular:     Rate and Rhythm: Normal rate and regular rhythm.  Heart sounds: Normal heart sounds. No murmur.  Pulmonary:     Effort: Pulmonary effort is normal. No respiratory distress.     Breath sounds: Normal breath sounds. No wheezing.  Abdominal:      General: Bowel sounds are normal. There is no distension.     Palpations: Abdomen is soft.     Tenderness: There is no abdominal tenderness.  Musculoskeletal:        General: No tenderness. Normal range of motion.     Cervical back: Normal range of motion and neck supple.  Skin:    General: Skin is warm and dry.  Neurological:     Mental Status: She is alert and oriented to person, place, and time.     Cranial Nerves: No cranial nerve deficit.     Deep Tendon Reflexes: Reflexes are normal and symmetric.  Psychiatric:        Behavior: Behavior normal.        Thought Content: Thought content normal.        Judgment: Judgment normal.       BP 117/78   Pulse 83   Temp 98.9 F (37.2 C) (Temporal)   Ht 5' 2" (1.575 m)   Wt 209 lb 6.4 oz (95 kg)   SpO2 95%   BMI 38.30 kg/m      Assessment & Plan:  Sandra Carroll comes in today with chief complaint of Medication Refill Sandra Carroll patient )   Diagnosis and orders addressed:  1. DDD (degenerative disc disease), lumbar - ToxASSURE Select 13 (MW), Urine - oxyCODONE-acetaminophen (PERCOCET) 10-325 MG tablet; Take 1 tablet by mouth every 8 (eight) hours as needed for pain.  Dispense: 90 tablet; Refill: 0 - oxyCODONE-acetaminophen (PERCOCET) 10-325 MG tablet; Take 1 tablet by mouth every 8 (eight) hours as needed for pain.  Dispense: 90 tablet; Refill: 0 - oxyCODONE-acetaminophen (PERCOCET) 10-325 MG tablet; Take 1 tablet by mouth every 8 (eight) hours as needed for pain.  Dispense: 90 tablet; Refill: 0 - CMP14+EGFR - CBC with Differential/Platelet  2. Pure hypercholesterolemia - simvastatin (ZOCOR) 20 MG tablet; Take 1 tablet (20 mg total) by mouth every evening.  Dispense: 30 tablet; Refill: 11 - CMP14+EGFR - CBC with Differential/Platelet  3. Essential hypertension - lisinopril (ZESTRIL) 20 MG tablet; TAKE ONE (1) TABLET EACH DAY  Dispense: 30 tablet; Refill: 11 - CMP14+EGFR - CBC with Differential/Platelet  4. DUB  (dysfunctional uterine bleeding) - desogestrel-ethinyl estradiol (VIORELE) 0.15-0.02/0.01 MG (21/5) tablet; TAKE ONE (1) TABLET EACH DAY  Dispense: 28 tablet; Refill: 11 - CMP14+EGFR - CBC with Differential/Platelet  5. Anxiety - CMP14+EGFR - CBC with Differential/Platelet  6. Constipation, unspecified constipation type - CMP14+EGFR - CBC with Differential/Platelet  7. Depression, major, single episode, mild (HCC) - CMP14+EGFR - CBC with Differential/Platelet  8. Uncomplicated opioid dependence ([place]) - ToxASSURE Select 13 (MW), Urine - oxyCODONE-acetaminophen (PERCOCET) 10-325 MG tablet; Take 1 tablet by mouth every 8 (eight) hours as needed for pain.  Dispense: 90 tablet; Refill: 0 - oxyCODONE-acetaminophen (PERCOCET) 10-325 MG tablet; Take 1 tablet by mouth every 8 (eight) hours as needed for pain.  Dispense: 90 tablet; Refill: 0 - oxyCODONE-acetaminophen (PERCOCET) 10-325 MG tablet; Take 1 tablet by mouth every 8 (eight) hours as needed for pain.  Dispense: 90 tablet; Refill: 0 - CMP14+EGFR - CBC with Differential/Platelet  9. Pain management contract signed - ToxASSURE Select 13 (MW), Urine - oxyCODONE-acetaminophen (PERCOCET) 10-325 MG tablet; Take 1 tablet by mouth every 8 (eight) hours as needed for  pain.  Dispense: 90 tablet; Refill: 0 - oxyCODONE-acetaminophen (PERCOCET) 10-325 MG tablet; Take 1 tablet by mouth every 8 (eight) hours as needed for pain.  Dispense: 90 tablet; Refill: 0 - oxyCODONE-acetaminophen (PERCOCET) 10-325 MG tablet; Take 1 tablet by mouth every 8 (eight) hours as needed for pain.  Dispense: 90 tablet; Refill: 0 - CMP14+EGFR - CBC with Differential/Platelet   Labs pending Health Maintenance reviewed Diet and exercise encouraged  Follow up plan: 3 months with PCP   Evelina Dun, FNP

## 2019-08-29 LAB — CBC WITH DIFFERENTIAL/PLATELET
Basophils Absolute: 0 10*3/uL (ref 0.0–0.2)
Basos: 1 %
EOS (ABSOLUTE): 0.1 10*3/uL (ref 0.0–0.4)
Eos: 1 %
Hematocrit: 39.9 % (ref 34.0–46.6)
Hemoglobin: 13.3 g/dL (ref 11.1–15.9)
Immature Grans (Abs): 0 10*3/uL (ref 0.0–0.1)
Immature Granulocytes: 0 %
Lymphocytes Absolute: 2.1 10*3/uL (ref 0.7–3.1)
Lymphs: 32 %
MCH: 31.3 pg (ref 26.6–33.0)
MCHC: 33.3 g/dL (ref 31.5–35.7)
MCV: 94 fL (ref 79–97)
Monocytes Absolute: 0.5 10*3/uL (ref 0.1–0.9)
Monocytes: 8 %
Neutrophils Absolute: 3.9 10*3/uL (ref 1.4–7.0)
Neutrophils: 58 %
Platelets: 282 10*3/uL (ref 150–450)
RBC: 4.25 x10E6/uL (ref 3.77–5.28)
RDW: 12.6 % (ref 11.7–15.4)
WBC: 6.6 10*3/uL (ref 3.4–10.8)

## 2019-08-29 LAB — CMP14+EGFR
ALT: 15 IU/L (ref 0–32)
AST: 23 IU/L (ref 0–40)
Albumin/Globulin Ratio: 1.4 (ref 1.2–2.2)
Albumin: 4.1 g/dL (ref 3.8–4.9)
Alkaline Phosphatase: 77 IU/L (ref 39–117)
BUN/Creatinine Ratio: 21 (ref 9–23)
BUN: 19 mg/dL (ref 6–24)
Bilirubin Total: <0.2 mg/dL (ref 0.0–1.2)
CO2: 22 mmol/L (ref 20–29)
Calcium: 10 mg/dL (ref 8.7–10.2)
Chloride: 103 mmol/L (ref 96–106)
Creatinine, Ser: 0.9 mg/dL (ref 0.57–1.00)
GFR calc Af Amer: 86 mL/min/{1.73_m2} (ref >59)
GFR calc non Af Amer: 74 mL/min/{1.73_m2} (ref >59)
Globulin, Total: 3 g/dL (ref 1.5–4.5)
Glucose: 91 mg/dL (ref 65–99)
Potassium: 4.2 mmol/L (ref 3.5–5.2)
Sodium: 139 mmol/L (ref 134–144)
Total Protein: 7.1 g/dL (ref 6.0–8.5)

## 2019-09-01 LAB — TOXASSURE SELECT 13 (MW), URINE

## 2019-09-03 ENCOUNTER — Other Ambulatory Visit: Payer: Self-pay | Admitting: Family

## 2019-09-03 ENCOUNTER — Other Ambulatory Visit: Payer: Self-pay | Admitting: *Deleted

## 2019-09-03 DIAGNOSIS — M5136 Other intervertebral disc degeneration, lumbar region: Secondary | ICD-10-CM

## 2019-11-01 ENCOUNTER — Other Ambulatory Visit: Payer: Self-pay | Admitting: *Deleted

## 2019-11-01 DIAGNOSIS — N938 Other specified abnormal uterine and vaginal bleeding: Secondary | ICD-10-CM

## 2019-11-01 MED ORDER — DESOGESTREL-ETHINYL ESTRADIOL 0.15-0.02/0.01 MG (21/5) PO TABS: ORAL_TABLET | ORAL | 0 refills | Status: DC

## 2019-11-29 ENCOUNTER — Ambulatory Visit: Payer: Managed Care, Other (non HMO) | Admitting: Family Medicine

## 2019-11-29 ENCOUNTER — Ambulatory Visit: Payer: Managed Care, Other (non HMO) | Admitting: Physician Assistant

## 2019-12-09 ENCOUNTER — Encounter (HOSPITAL_COMMUNITY): Payer: Self-pay | Admitting: *Deleted

## 2019-12-09 ENCOUNTER — Emergency Department (HOSPITAL_COMMUNITY): Payer: Managed Care, Other (non HMO)

## 2019-12-09 ENCOUNTER — Inpatient Hospital Stay (HOSPITAL_COMMUNITY)
Admission: EM | Admit: 2019-12-09 | Discharge: 2019-12-13 | DRG: 854 | Disposition: A | Discharge status: Home / Self Care | Payer: Managed Care, Other (non HMO) | Attending: Internal Medicine | Admitting: Internal Medicine

## 2019-12-09 ENCOUNTER — Encounter (HOSPITAL_COMMUNITY)
Admission: EM | Disposition: A | Discharge status: Home / Self Care | Payer: Self-pay | Source: Home / Self Care | Attending: Internal Medicine

## 2019-12-09 ENCOUNTER — Other Ambulatory Visit: Payer: Self-pay

## 2019-12-09 DIAGNOSIS — A4151 Sepsis due to Escherichia coli [E. coli]: Secondary | ICD-10-CM | POA: Diagnosis present

## 2019-12-09 DIAGNOSIS — N12 Tubulo-interstitial nephritis, not specified as acute or chronic: Secondary | ICD-10-CM | POA: Diagnosis not present

## 2019-12-09 DIAGNOSIS — Z809 Family history of malignant neoplasm, unspecified: Secondary | ICD-10-CM

## 2019-12-09 DIAGNOSIS — N200 Calculus of kidney: Secondary | ICD-10-CM | POA: Diagnosis not present

## 2019-12-09 DIAGNOSIS — E669 Obesity, unspecified: Secondary | ICD-10-CM | POA: Diagnosis present

## 2019-12-09 DIAGNOSIS — N135 Crossing vessel and stricture of ureter without hydronephrosis: Secondary | ICD-10-CM | POA: Diagnosis not present

## 2019-12-09 DIAGNOSIS — Z79899 Other long term (current) drug therapy: Secondary | ICD-10-CM

## 2019-12-09 DIAGNOSIS — Z20822 Contact with and (suspected) exposure to covid-19: Secondary | ICD-10-CM | POA: Diagnosis present

## 2019-12-09 DIAGNOSIS — G8929 Other chronic pain: Secondary | ICD-10-CM | POA: Diagnosis present

## 2019-12-09 DIAGNOSIS — Z87442 Personal history of urinary calculi: Secondary | ICD-10-CM

## 2019-12-09 DIAGNOSIS — E861 Hypovolemia: Secondary | ICD-10-CM | POA: Diagnosis present

## 2019-12-09 DIAGNOSIS — N138 Other obstructive and reflux uropathy: Secondary | ICD-10-CM

## 2019-12-09 DIAGNOSIS — I10 Essential (primary) hypertension: Secondary | ICD-10-CM | POA: Diagnosis present

## 2019-12-09 DIAGNOSIS — R109 Unspecified abdominal pain: Secondary | ICD-10-CM | POA: Diagnosis present

## 2019-12-09 DIAGNOSIS — D649 Anemia, unspecified: Secondary | ICD-10-CM | POA: Diagnosis present

## 2019-12-09 DIAGNOSIS — E785 Hyperlipidemia, unspecified: Secondary | ICD-10-CM | POA: Diagnosis present

## 2019-12-09 DIAGNOSIS — Z01818 Encounter for other preprocedural examination: Secondary | ICD-10-CM | POA: Diagnosis present

## 2019-12-09 DIAGNOSIS — E860 Dehydration: Secondary | ICD-10-CM | POA: Diagnosis present

## 2019-12-09 DIAGNOSIS — M47817 Spondylosis without myelopathy or radiculopathy, lumbosacral region: Secondary | ICD-10-CM | POA: Diagnosis present

## 2019-12-09 DIAGNOSIS — Z8249 Family history of ischemic heart disease and other diseases of the circulatory system: Secondary | ICD-10-CM

## 2019-12-09 DIAGNOSIS — Z6839 Body mass index (BMI) 39.0-39.9, adult: Secondary | ICD-10-CM

## 2019-12-09 DIAGNOSIS — A419 Sepsis, unspecified organism: Secondary | ICD-10-CM

## 2019-12-09 DIAGNOSIS — N201 Calculus of ureter: Secondary | ICD-10-CM | POA: Diagnosis present

## 2019-12-09 DIAGNOSIS — Z87891 Personal history of nicotine dependence: Secondary | ICD-10-CM

## 2019-12-09 DIAGNOSIS — N179 Acute kidney failure, unspecified: Secondary | ICD-10-CM | POA: Diagnosis present

## 2019-12-09 DIAGNOSIS — R1032 Left lower quadrant pain: Secondary | ICD-10-CM | POA: Diagnosis present

## 2019-12-09 DIAGNOSIS — R112 Nausea with vomiting, unspecified: Secondary | ICD-10-CM | POA: Diagnosis present

## 2019-12-09 DIAGNOSIS — R7881 Bacteremia: Secondary | ICD-10-CM | POA: Diagnosis not present

## 2019-12-09 DIAGNOSIS — R6520 Severe sepsis without septic shock: Secondary | ICD-10-CM | POA: Diagnosis not present

## 2019-12-09 DIAGNOSIS — N136 Pyonephrosis: Secondary | ICD-10-CM | POA: Diagnosis present

## 2019-12-09 DIAGNOSIS — E869 Volume depletion, unspecified: Secondary | ICD-10-CM | POA: Diagnosis not present

## 2019-12-09 DIAGNOSIS — F418 Other specified anxiety disorders: Secondary | ICD-10-CM | POA: Diagnosis present

## 2019-12-09 HISTORY — PX: CYSTOSCOPY W/ URETERAL STENT PLACEMENT: SHX1429

## 2019-12-09 LAB — COMPREHENSIVE METABOLIC PANEL
ALT: 18 U/L (ref 0–44)
AST: 22 U/L (ref 15–41)
Albumin: 3.1 g/dL — ABNORMAL LOW (ref 3.5–5.0)
Alkaline Phosphatase: 59 U/L (ref 38–126)
Anion gap: 12 (ref 5–15)
BUN: 23 mg/dL — ABNORMAL HIGH (ref 6–20)
CO2: 22 mmol/L (ref 22–32)
Calcium: 9.2 mg/dL (ref 8.9–10.3)
Chloride: 99 mmol/L (ref 98–111)
Creatinine, Ser: 2.06 mg/dL — ABNORMAL HIGH (ref 0.44–1.00)
GFR calc Af Amer: 32 mL/min — ABNORMAL LOW (ref >60)
GFR calc non Af Amer: 27 mL/min — ABNORMAL LOW (ref >60)
Glucose, Bld: 137 mg/dL — ABNORMAL HIGH (ref 70–99)
Potassium: 3.5 mmol/L (ref 3.5–5.1)
Sodium: 133 mmol/L — ABNORMAL LOW (ref 135–145)
Total Bilirubin: 1.5 mg/dL — ABNORMAL HIGH (ref 0.3–1.2)
Total Protein: 7.1 g/dL (ref 6.5–8.1)

## 2019-12-09 LAB — LACTIC ACID, PLASMA
Lactic Acid, Venous: 2.6 mmol/L (ref 0.5–1.9)
Lactic Acid, Venous: 1.7 mmol/L (ref 0.5–1.9)

## 2019-12-09 LAB — APTT: aPTT: 32 seconds (ref 24–36)

## 2019-12-09 LAB — CBC WITH DIFFERENTIAL/PLATELET
Abs Immature Granulocytes: 0.45 10*3/uL — ABNORMAL HIGH (ref 0.00–0.07)
Basophils Absolute: 0.1 10*3/uL (ref 0.0–0.1)
Basophils Relative: 0 %
Eosinophils Absolute: 0 10*3/uL (ref 0.0–0.5)
Eosinophils Relative: 0 %
HCT: 39.4 % (ref 36.0–46.0)
Hemoglobin: 12.9 g/dL (ref 12.0–15.0)
Immature Granulocytes: 2 %
Lymphocytes Relative: 4 %
Lymphs Abs: 0.8 10*3/uL (ref 0.7–4.0)
MCH: 31.5 pg (ref 26.0–34.0)
MCHC: 32.7 g/dL (ref 30.0–36.0)
MCV: 96.1 fL (ref 80.0–100.0)
Monocytes Absolute: 0.6 10*3/uL (ref 0.1–1.0)
Monocytes Relative: 3 %
Neutro Abs: 18 10*3/uL — ABNORMAL HIGH (ref 1.7–7.7)
Neutrophils Relative %: 91 %
Platelets: 260 10*3/uL (ref 150–400)
RBC: 4.1 MIL/uL (ref 3.87–5.11)
RDW: 13.6 % (ref 11.5–15.5)
WBC: 19.9 10*3/uL — ABNORMAL HIGH (ref 4.0–10.5)
nRBC: 0 % (ref 0.0–0.2)

## 2019-12-09 LAB — URINALYSIS, ROUTINE W REFLEX MICROSCOPIC
Bilirubin Urine: NEGATIVE
Glucose, UA: NEGATIVE mg/dL
Hgb urine dipstick: NEGATIVE
Ketones, ur: NEGATIVE mg/dL
Nitrite: NEGATIVE
Protein, ur: >=300 mg/dL — AB
Specific Gravity, Urine: 1.018 (ref 1.005–1.030)
WBC, UA: >50 WBC/hpf — ABNORMAL HIGH (ref 0–5)
pH: 5 (ref 5.0–8.0)

## 2019-12-09 LAB — SARS CORONAVIRUS 2 BY RT PCR (HOSPITAL ORDER, PERFORMED IN ~~LOC~~ HOSPITAL LAB): SARS Coronavirus 2: NEGATIVE

## 2019-12-09 LAB — PREGNANCY, URINE: Preg Test, Ur: NEGATIVE

## 2019-12-09 LAB — LIPASE, BLOOD: Lipase: 15 U/L (ref 11–51)

## 2019-12-09 LAB — PROTIME-INR
INR: 1.3 — ABNORMAL HIGH (ref 0.8–1.2)
Prothrombin Time: 15.5 seconds — ABNORMAL HIGH (ref 11.4–15.2)

## 2019-12-09 SURGERY — CYSTOSCOPY, WITH RETROGRADE PYELOGRAM AND URETERAL STENT INSERTION
Anesthesia: General | Site: Ureter | Laterality: Left

## 2019-12-09 MED ORDER — ONDANSETRON HCL 4 MG/2ML IJ SOLN
4.0000 mg | Freq: Once | INTRAMUSCULAR | Status: AC
  Administered 2019-12-09: 4 mg via INTRAVENOUS
  Filled 2019-12-09: qty 2

## 2019-12-09 MED ORDER — HYDROMORPHONE HCL 1 MG/ML IJ SOLN
0.5000 mg | INTRAMUSCULAR | Status: DC | PRN
  Administered 2019-12-09: 0.5 mg via INTRAVENOUS
  Administered 2019-12-10 – 2019-12-11 (×7): 1 mg via INTRAVENOUS
  Filled 2019-12-09 (×8): qty 1

## 2019-12-09 MED ORDER — PIPERACILLIN-TAZOBACTAM 3.375 G IVPB: 3.3750 g | Freq: Three times a day (TID) | INTRAVENOUS | Status: DC

## 2019-12-09 MED ORDER — LACTATED RINGERS IV BOLUS (SEPSIS): 1000.0000 mL | Freq: Once | INTRAVENOUS | Status: DC

## 2019-12-09 MED ORDER — SODIUM CHLORIDE 0.9 % IV BOLUS (SEPSIS)
1000.0000 mL | Freq: Once | INTRAVENOUS | Status: AC
  Administered 2019-12-09: 1000 mL via INTRAVENOUS

## 2019-12-09 MED ORDER — VANCOMYCIN HCL 1250 MG/250ML IV SOLN
1250.0000 mg | INTRAVENOUS | Status: DC
  Administered 2019-12-09 – 2019-12-10 (×2): 1250 mg via INTRAVENOUS
  Filled 2019-12-09 (×2): qty 250

## 2019-12-09 MED ORDER — SODIUM CHLORIDE 0.9 % IV BOLUS
1000.0000 mL | Freq: Once | INTRAVENOUS | Status: AC
  Administered 2019-12-09: 1000 mL via INTRAVENOUS

## 2019-12-09 MED ORDER — PIPERACILLIN-TAZOBACTAM 3.375 G IVPB 30 MIN
3.3750 g | Freq: Once | INTRAVENOUS | Status: AC
  Administered 2019-12-09: 3.375 g via INTRAVENOUS
  Filled 2019-12-09: qty 50

## 2019-12-09 MED ORDER — TRAMADOL HCL 50 MG PO TABS
50.0000 mg | ORAL_TABLET | Freq: Three times a day (TID) | ORAL | Status: DC | PRN
  Administered 2019-12-11 – 2019-12-12 (×2): 50 mg via ORAL
  Filled 2019-12-09 (×2): qty 1

## 2019-12-09 MED ORDER — SODIUM CHLORIDE 0.9 % IV SOLN
1.0000 g | Freq: Two times a day (BID) | INTRAVENOUS | Status: DC
  Administered 2019-12-09 – 2019-12-11 (×4): 1 g via INTRAVENOUS
  Filled 2019-12-09 (×4): qty 1

## 2019-12-09 MED ORDER — SODIUM CHLORIDE 0.9 % IV SOLN: INTRAVENOUS | Status: DC

## 2019-12-09 MED ORDER — MORPHINE SULFATE (PF) 4 MG/ML IV SOLN
4.0000 mg | Freq: Once | INTRAVENOUS | Status: AC
  Administered 2019-12-09: 4 mg via INTRAVENOUS
  Filled 2019-12-09: qty 1

## 2019-12-09 MED ORDER — SODIUM CHLORIDE 0.9 % IV BOLUS
2000.0000 mL | Freq: Once | INTRAVENOUS | Status: AC
  Administered 2019-12-09: 2000 mL via INTRAVENOUS

## 2019-12-09 MED ORDER — POLYETHYLENE GLYCOL 3350 17 G PO PACK: 17.0000 g | PACK | Freq: Every day | ORAL | Status: DC | PRN

## 2019-12-09 MED ORDER — FENTANYL CITRATE (PF) 100 MCG/2ML IJ SOLN
50.0000 ug | Freq: Once | INTRAMUSCULAR | Status: AC
  Administered 2019-12-09: 50 ug via INTRAVENOUS
  Filled 2019-12-09: qty 2

## 2019-12-09 MED ORDER — ONDANSETRON HCL 4 MG PO TABS: 4.0000 mg | ORAL_TABLET | Freq: Four times a day (QID) | ORAL | Status: DC | PRN

## 2019-12-09 MED ORDER — ONDANSETRON HCL 4 MG/2ML IJ SOLN: 4.0000 mg | Freq: Four times a day (QID) | INTRAMUSCULAR | Status: DC | PRN

## 2019-12-09 SURGICAL SUPPLY — 20 items
BAG URO CATCHER STRL LF (MISCELLANEOUS) ×3 IMPLANT
CATH URET 5FR 28IN OPEN ENDED (CATHETERS) ×2 IMPLANT
CLOTH BEACON ORANGE TIMEOUT ST (SAFETY) ×3 IMPLANT
GLOVE BIOGEL PI IND STRL 7.5 (GLOVE) ×1 IMPLANT
GLOVE BIOGEL PI IND STRL 8 (GLOVE) IMPLANT
GLOVE BIOGEL PI INDICATOR 7.5 (GLOVE) ×2
GLOVE BIOGEL PI INDICATOR 8 (GLOVE) ×2
GLOVE INDICATOR 8.0 STRL GRN (GLOVE) ×2 IMPLANT
GLOVE SURG SS PI 8.0 STRL IVOR (GLOVE) ×2 IMPLANT
GOWN STRL REUS W/ TWL XL LVL3 (GOWN DISPOSABLE) IMPLANT
GOWN STRL REUS W/TWL XL LVL3 (GOWN DISPOSABLE) ×9 IMPLANT
GUIDEWIRE STR DUAL SENSOR (WIRE) ×3 IMPLANT
KIT TURNOVER KIT A (KITS) ×2 IMPLANT
MANIFOLD NEPTUNE II (INSTRUMENTS) ×3 IMPLANT
STENT URET 6FRX24 CONTOUR (STENTS) ×2 IMPLANT
TRAY CYSTO PACK (CUSTOM PROCEDURE TRAY) ×3 IMPLANT
TRAY FOLEY MTR SLVR 16FR STAT (SET/KITS/TRAYS/PACK) ×3 IMPLANT
TUBING CONNECTING 10 (TUBING) ×2 IMPLANT
TUBING CONNECTING 10' (TUBING) ×1
TUBING UROLOGY SET (TUBING) ×2 IMPLANT

## 2019-12-09 NOTE — Progress Notes (Signed)
Notified bedside nurse of need to administer antibiotics.  

## 2019-12-09 NOTE — Progress Notes (Signed)
Notified bedside nurse of need to draw repeat lactic acid @ 1842.

## 2019-12-09 NOTE — ED Notes (Signed)
Report to [facility] by Sheralyn Boatman, RN

## 2019-12-09 NOTE — ED Notes (Signed)
Per Dr Arlean Hopping  No beds at [facility]   However, Dr Annabell Howells orders transfer to [facility] OR directly

## 2019-12-09 NOTE — ED Provider Notes (Signed)
Patient signed out to me at shift change by Tanda Rockers, PA-C, pending CT abdomen pelvis.  Suspect intra-abdominal infection, possibly diverticulitis.  She is tachycardic and hypotensive here, subjective fevers.  She was given IV Zosyn pending her work-up.   CT imaging revealing for a 4 mm urethral stone per below.  Patient comes in with urosepsis.  Discussed patient's findings with Dr. Annabell Howells.  He will consult this patient, will plan to take her to the OR this evening for stenting to retrieve the stone.  He has asked for hospitalist admission, transferring patient to [facility].  Discussed with Dr. Arlean Hopping who accepts pt for transfer/admission at [facility].   Results for orders placed or performed during the hospital encounter of 12/09/19  Blood Culture (routine x 2)   Specimen: BLOOD RIGHT HAND  Result Value Ref Range   Specimen Description BLOOD RIGHT HAND    Special Requests      BOTTLES DRAWN AEROBIC AND ANAEROBIC Blood Culture adequate volume Performed at [facility], [address] [place], [place] [address]    Culture PENDING    Report Status PENDING   Blood Culture (routine x 2)   Specimen: BLOOD LEFT HAND  Result Value Ref Range   Specimen Description BLOOD LEFT HAND    Special Requests      BOTTLES DRAWN AEROBIC AND ANAEROBIC Blood Culture adequate volume Performed at [facility], [address], [place], [place] [address]    Culture PENDING    Report Status PENDING   SARS Coronavirus 2 by RT PCR (hospital order, performed in [facility] hospital lab) Nasopharyngeal Nasopharyngeal Swab   Specimen: Nasopharyngeal Swab  Result Value Ref Range   SARS Coronavirus 2 NEGATIVE NEGATIVE  Comprehensive metabolic panel  Result Value Ref Range   Sodium 133 (L) 135 - 145 mmol/L   Potassium 3.5 3.5 - 5.1 mmol/L   Chloride 99 98 - 111 mmol/L   CO2 22 22 - 32 mmol/L   Glucose, Bld 137 (H) 70 - 99 mg/dL   BUN 23 (H) 6 - 20 mg/dL   Creatinine, Ser 7.41 (H) 0.44 - 1.00 mg/dL   Calcium  9.2 8.9 - 28.7 mg/dL   Total Protein 7.1 6.5 - 8.1 g/dL   Albumin 3.1 (L) 3.5 - 5.0 g/dL   AST 22 15 - 41 U/L   ALT 18 0 - 44 U/L   Alkaline Phosphatase 59 38 - 126 U/L   Total Bilirubin 1.5 (H) 0.3 - 1.2 mg/dL   GFR calc non Af Amer 27 (L) >60 mL/min   GFR calc Af Amer 32 (L) >60 mL/min   Anion gap 12 5 - 15  Lipase, blood  Result Value Ref Range   Lipase 15 11 - 51 U/L  CBC with Differential  Result Value Ref Range   WBC 19.9 (H) 4.0 - 10.5 K/uL   RBC 4.10 3.87 - 5.11 MIL/uL   Hemoglobin 12.9 12.0 - 15.0 g/dL   HCT 86.7 36 - 46 %   MCV 96.1 80.0 - 100.0 fL   MCH 31.5 26.0 - 34.0 pg   MCHC 32.7 30.0 - 36.0 g/dL   RDW 67.2 09.4 - 70.9 %   Platelets 260 150 - 400 K/uL   nRBC 0.0 0.0 - 0.2 %   Neutrophils Relative % 91 %   Neutro Abs 18.0 (H) 1.7 - 7.7 K/uL   Lymphocytes Relative 4 %   Lymphs Abs 0.8 0.7 - 4.0 K/uL   Monocytes Relative 3 %  Monocytes Absolute 0.6 0 - 1 K/uL   Eosinophils Relative 0 %   Eosinophils Absolute 0.0 0 - 0 K/uL   Basophils Relative 0 %   Basophils Absolute 0.1 0 - 0 K/uL   Immature Granulocytes 2 %   Abs Immature Granulocytes 0.45 (H) 0.00 - 0.07 K/uL  Urinalysis, Routine w reflex microscopic  Result Value Ref Range   Color, Urine YELLOW YELLOW   APPearance HAZY (A) CLEAR   Specific Gravity, Urine 1.018 1.005 - 1.030   pH 5.0 5.0 - 8.0   Glucose, UA NEGATIVE NEGATIVE mg/dL   Hgb urine dipstick NEGATIVE NEGATIVE   Bilirubin Urine NEGATIVE NEGATIVE   Ketones, ur NEGATIVE NEGATIVE mg/dL   Protein, ur >=300 (A) NEGATIVE mg/dL   Nitrite NEGATIVE NEGATIVE   Leukocytes,Ua MODERATE (A) NEGATIVE   RBC / HPF 6-10 0 - 5 RBC/hpf   WBC, UA >50 (H) 0 - 5 WBC/hpf   Bacteria, UA FEW (A) NONE SEEN   Squamous Epithelial / LPF 11-20 0 - 5   WBC Clumps PRESENT    Hyaline Casts, UA PRESENT    Non Squamous Epithelial 0-5 (A) NONE SEEN  Lactic acid, plasma  Result Value Ref Range   Lactic Acid, Venous 2.6 (HH) 0.5 - 1.9 mmol/L  Lactic acid, plasma   Result Value Ref Range   Lactic Acid, Venous 1.7 0.5 - 1.9 mmol/L  APTT  Result Value Ref Range   aPTT 32 24 - 36 seconds  Protime-INR  Result Value Ref Range   Prothrombin Time 15.5 (H) 11.4 - 15.2 seconds   INR 1.3 (H) 0.8 - 1.2  Pregnancy, urine  Result Value Ref Range   Preg Test, Ur NEGATIVE NEGATIVE   CT ABDOMEN PELVIS WO CONTRAST  Result Date: 12/09/2019 CLINICAL DATA:  Left abdominal pain, vomiting EXAM: CT ABDOMEN AND PELVIS WITHOUT CONTRAST TECHNIQUE: Multidetector CT imaging of the abdomen and pelvis was performed following the standard protocol without IV contrast. COMPARISON:  2019 FINDINGS: Lower chest: Mild bibasilar atelectasis/scarring. Hepatobiliary: No focal liver lesion.  Gallbladder is contracted. Pancreas: Unremarkable. Spleen: Unremarkable. Adrenals/Urinary Tract: Adrenals are unremarkable. There is left perinephric stranding and left hydronephrosis. Left hydroureter with surrounding inflammatory change. An obstructing 4 mm calculus is present within the distal ureter. Right kidney is unremarkable. The bladder is unremarkable. Stomach/Bowel: Small hiatal hernia. Stomach is otherwise unremarkable. Bowel is normal in caliber. Vascular/Lymphatic: Mild aortic atherosclerosis. Pathologically enlarged lymph nodes. Reproductive: Uterus and bilateral adnexa are unremarkable. Other: Fat containing left inguinal hernia. Musculoskeletal: Degenerative changes of the spine. No acute osseous abnormality IMPRESSION: 4 mm obstructing distal left ureteral calculus with proximal hydroureteronephrosis. Electronically Signed   By: Macy Mis M.D.   On: 12/09/2019 19:55   DG Chest Port 1 View  Result Date: 12/09/2019 CLINICAL DATA:  Abdominal pain with nausea and vomiting. EXAM: PORTABLE CHEST 1 VIEW COMPARISON:  August 28, 2016 FINDINGS: Mild, diffuse chronic appearing increased lung markings are seen without evidence of acute infiltrate, pleural effusion or pneumothorax. The heart size  and mediastinal contours are within normal limits. Degenerative changes seen within the thoracic spine. IMPRESSION: No active disease. Electronically Signed   By: Virgina Norfolk M.D.   On: 12/09/2019 18:04      Landis Martins 12/09/19 2121    Varney Biles, MD 12/09/19 2205

## 2019-12-09 NOTE — ED Notes (Signed)
Med verified by pharm   Call to [facility] who will procure meds

## 2019-12-09 NOTE — Progress Notes (Signed)
Pharmacy Antibiotic Note  Sandra Carroll is a 54 y.o. female admitted on 12/09/2019 with urosepsis with obstruction.  Pharmacy has been consulted for meropenem and vancomycin dosing.  Discussed UpToDate empiric antimicrobial therapy recommendations and agreed with Dr Arlean Hopping to use broad coverage with meropenem and vancomycin.   Plan: Discontinue Zosyn Begin meropenem 1 g IV q12h Begin vancomycin 1250 mg IV q24h Monitor clinical picture, renal function, vancomycin level(s) if indicated F/U C&S, abx deescalation / LOT    Weight: 92.2 kg (203 lb 4 oz)  Temp (24hrs), Avg:98.2 F (36.8 C), Min:98.2 F (36.8 C), Max:98.2 F (36.8 C)  Recent Labs  Lab 12/09/19 1616 12/09/19 1651 12/09/19 1833  WBC 19.9*  --   --   CREATININE 2.06*  --   --   LATICACIDVEN  --  2.6* 1.7    Estimated Creatinine Clearance: 34.1 mL/min (A) (by C-G formula based on SCr of 2.06 mg/dL (H)).    No Known Allergies  Antimicrobials this admission: 6/21 Zosyn x 1 6/21 vancomycin >> 6/21 meropenem >>  Dose adjustments this admission:   Microbiology results: 6/21BCx: pending 6/21 UCx: sent   Thank you for allowing pharmacy to be a part of this patients care.  Royce Macadamia, PharmD, BCPS 12/09/2019 10:19 PM

## 2019-12-09 NOTE — H&P (Addendum)
[facility] History & Physical  Rob Tax adviser MD  ELLISHA Carroll 12/09/2019  Chief Complaint: Abd pain HPI: The patient is a 54 y.o. year-old w/ hx of HTN, HL and kidney stones presenting to ED w/ 1.5 day hx of L sided abdominal pain, nausea and refractory emesis (10x yesterday). Has stopped eating due to emesis today. LLQ abd pain wraps around the L flank. +fevers at home, no SOB or CP, no diarrhea. In ED HR 130, BP 90's and RR 24 initially.  CT abd showed 4 mm obstructing distal left ureteral calculus with proximal Hydroureteronephrosis.  Pt was given IVpain meds, 2 L NS bolus and abx w/ IV zosyn. HR is down to 110 and BP stable at 98/ 67.  Pt still in lots of pain.  ED spoke w/ urology Dr Annabell Howells who requests pt being admitted to [facility] for possible procedure tonight.  We are asked to see for admission.   Pt seen in ED.  She is uncomfortable. States she has had this same problems "3 times before", where they had to go in and take the stone out. These were done at [facility]. She has had GB surgery as well as some point. Only home meds are BC pill, BP lisinopril, celexa and statin. Pt is single, no etoh, former smoker has quit, no drug use.   States main issues are as above, LLQ/ L flank pain and nausea / vomiting. A little better since arriving here 4 hrs ago.    ROS  denies CP  no joint pain   no HA  no blurry vision  no rash  no diarrhea  no dysuria  no difficulty voiding  no change in urine color   Past Medical History  Past Medical History:  Diagnosis Date  . Chronic back pain   . Complication of anesthesia 1991 and 1998   childbirth --reaction to anesthesia was itching.   . DDD (degenerative disc disease)   . Hemorrhoids   . Hyperlipidemia   . Hypertension   . PONV (postoperative nausea and vomiting)   . Renal disorder    kidney stones  . Shortness of breath    quit smoking two years ago and still has some sob with exertion    Past Surgical History  Past  Surgical History:  Procedure Laterality Date  . ADENOIDECTOMY  1978  . CESAREAN SECTION  1991 1998   pt had itching with 1991 c-section.   . COLONOSCOPY N/A 03/01/2013   Dr. Jena Gauss- normal rectum except for anal canal/internal hemorrhoidal tags. colonic mucosa appeared normal.  . KNEE ARTHROSCOPY  2003  . LITHOTRIPSY    . LUMBAR LAMINECTOMY/DECOMPRESSION MICRODISCECTOMY  03/02/2012   Procedure: LUMBAR LAMINECTOMY/DECOMPRESSION MICRODISCECTOMY 1 LEVEL;  Surgeon: Carmela Hurt, MD;  Location: [facility]UR[facility] ORS;  Service: Neurosurgery;  Laterality: Left;  LEFT Lumbar Five-Sacral One Laminectomy for Decompression    Family History  Family History  Problem Relation Age of Onset  . Hypertension Mother   . Cancer Father   . Hypertension Father   . Colon cancer Neg Hx    Social History  reports that she quit smoking about 11 years ago. She has never used smokeless tobacco. She reports that she does not drink alcohol and does not use drugs. Allergies No Known Allergies Home medications Prior to Admission medications   Medication Sig Start Date End Date Taking? Authorizing Provider  citalopram (CELEXA) 20 MG tablet Take 0.5 tablets (10 mg total) by mouth daily. 07/05/19  Yes Terald Sleeper, PA-C  desogestrel-ethinyl estradiol (VIORELE) 0.15-0.02/0.01 MG (21/5) tablet TAKE ONE (1) TABLET EACH DAY (Needs physical) Patient taking differently: Take 1 tablet by mouth daily.  11/01/19  Yes Dettinger, Fransisca Kaufmann, MD  lisinopril (ZESTRIL) 20 MG tablet TAKE ONE (1) TABLET EACH DAY 08/28/19  Yes Hawks, Christy A, FNP  simvastatin (ZOCOR) 20 MG tablet Take 1 tablet (20 mg total) by mouth every evening. Patient taking differently: Take 20 mg by mouth every morning.  08/28/19  Yes Hawks, Theador Hawthorne, FNP       Exam Gen obese pleasant WF, in lots of pain, uncomfortable Fully Ox 3 and responsive No rash, cyanosis or gangrene Sclera anicteric, throat clear slightly dry   No jvd or bruits, flat neck veins Chest  clear bilat to bases to bases no rales, wheezing or bronchial BS RRR no MRG Abd soft ntnd no mass or ascites +bs, + L flank pain to percussion GU defer MS no joint effusions or deformity Ext no LE edema, no wounds or ulcers Neuro is alert, Ox 3 , nf    Home meds:  - lisinopril 20 qd/ zocor 20 hs  - BC pill  - celexa 10mg  qd    UA few bact >50 wbc, +clumps, 6-10 rbc, >300 prot   BUN 23  Creat 2.06 (was 0.90 in march 2021)  CO2 22 K 4.2  Alb 4.1  LFT"s okay   WBC 19k  Hb 12  plt 260  Assessment/ Plan: 1. Complicated pyelonephritis / L ureteral obstruction - Pt presenting w/ L abd pain and refractory N/V, hypotensive and tachycardic w/ sepsis picture. HR better after 2L bolus.  LA down on 2nd draw. Still has room for volume, will bolus another 2 L and cont NS at 100/hr.  Got zosyn x 1 in ED, however UTD recommends vanc/ cefepime due to obstructed GU tract so will order now.  Urology wants to possibly take her for procedure tonight, keep NPO.  Orders placed for admit to [facility] in [place].  IV pain and nausea meds ordered, as BP will tolerate.  2. AKI - due to dehydratoin/ ACEi, and obstructed L kidney. Supportive care, treat obstruction and vol depletion. Hold ACEi.  3. HTN - holding BP medication d/t low BP 4. Hypovolemia - as above      Kelly Splinter  MD 12/09/2019, 9:38 PM

## 2019-12-09 NOTE — Progress Notes (Signed)
Pharmacy Antibiotic Note  Sandra Carroll is a 54 y.o. female admitted on 12/09/2019 with complicated pyelo w/ obstructed ureter by stone.  Pharmacy has been consulted for Zosyn dosing.  Plan: Zosyn 3.375g IV q8h (4 hour infusion).  Monitor clinical progress and renal function F/U C&S, abx deescalation / LOT   Weight: 92.2 kg (203 lb 4 oz)  Temp (24hrs), Avg:98.2 F (36.8 C), Min:98.2 F (36.8 C), Max:98.2 F (36.8 C)  Recent Labs  Lab 12/09/19 1616 12/09/19 1651 12/09/19 1833  WBC 19.9*  --   --   CREATININE 2.06*  --   --   LATICACIDVEN  --  2.6* 1.7    Estimated Creatinine Clearance: 34.1 mL/min (A) (by C-G formula based on SCr of 2.06 mg/dL (H)).    No Known Allergies  Antimicrobials this admission: Zosyn 6/21 >>   Dose adjustments this admission:  Microbiology results: 6/21BCx: pending 6/21 UCx: sent    Thank you for allowing pharmacy to be a part of this patient's care.  Waverley Krempasky P. Casimiro Needle, PharmD, BCPS Clinical Pharmacist [place] Please utilize Amion for appropriate phone number to reach the unit pharmacist [facility]) 12/09/2019 9:38 PM

## 2019-12-09 NOTE — ED Notes (Signed)
Unable to assess this pt until now due to   No rapid response team   Critical covid patient cared for and transferred   Staffing shortage of bedside nurses  (prior notifications by off site RN)  Pt report pain 10/10 Speaking to daughter on phone   Apologies for unable to assess prior Pt reports understanding   Hospitalist to bedside for pt  Speaks of transfer to [facility] Pt out of bed to [facility] Transfer paperwork signed  meds given and pain addressed

## 2019-12-09 NOTE — ED Triage Notes (Signed)
Abdominal pain onset yesterday with vomiting

## 2019-12-09 NOTE — ED Provider Notes (Signed)
[facility] EMERGENCY DEPARTMENT Provider Note   CSN: [id number] Arrival date & time: 12/09/19  1224     History Chief Complaint  Patient presents with  . Abdominal Pain    Sandra Carroll is a 54 y.o. female with PMHx HTN, HLD, who presents to the ED today with complaint of gradual onset, constant, worsening, LLQ abdominal pain x 1-2 days. Pt also complains of nausea and NBNB emesis (> 10 episodes yesterday).  Patient reports she has not tried taking anything for pain due to the fact that she cannot keep anything down.  She states that she last vomited around 7 this morning, has not tried to eat or drink anything since then.  She denies any suspicious food intake.  No recent sick contacts or recent foreign travel.  Patient states the pain does wrap around to her lower back.  She denies any urinary symptoms.  She does mention history of kidney stones and states this feels somewhat similar however denies any urinary symptoms at this time. She complains of subjective fevers as well. Pt denies coffee ground emesis, hematemesis, diarrhea, constipation, BRBPR, melena, urinary sx, or any other associated symptoms.   The history is provided by the patient and medical records.       Past Medical History:  Diagnosis Date  . Chronic back pain   . Complication of anesthesia 1991 and 1998   childbirth --reaction to anesthesia was itching.   . DDD (degenerative disc disease)   . Hemorrhoids   . Hyperlipidemia   . Hypertension   . PONV (postoperative nausea and vomiting)   . Renal disorder    kidney stones  . Shortness of breath    quit smoking two years ago and still has some sob with exertion     Patient Active Problem List   Diagnosis Date Noted  . Depression, major, single episode, mild (HCC) 07/05/2019  . Anxiety 07/05/2019  . Mood change 07/05/2019  . History of kidney stones 08/28/2018  . Bursitis of right hip 07/17/2017  . Sciatica of right side 01/03/2017  . DDD (degenerative  disc disease), lumbar 10/04/2016  . Essential hypertension 10/04/2016  . Pure hypercholesterolemia 10/04/2016  . DUB (dysfunctional uterine bleeding) 10/04/2016  . Internal hemorrhoids 07/25/2014  . Upper abdominal pain 07/25/2014  . Nausea and vomiting 07/25/2014  . Rash 07/25/2014  . Enteritis 07/25/2014  . Internal hemorrhoids with other complication 06/25/2013  . Rectal bleeding 02/16/2013  . Constipation 02/16/2013    Past Surgical History:  Procedure Laterality Date  . ADENOIDECTOMY  1978  . CESAREAN SECTION  1991 1998   pt had itching with 1991 c-section.   . COLONOSCOPY N/A 03/01/2013   Dr. Jena Gauss- normal rectum except for anal canal/internal hemorrhoidal tags. colonic mucosa appeared normal.  . KNEE ARTHROSCOPY  2003  . LITHOTRIPSY    . LUMBAR LAMINECTOMY/DECOMPRESSION MICRODISCECTOMY  03/02/2012   Procedure: LUMBAR LAMINECTOMY/DECOMPRESSION MICRODISCECTOMY 1 LEVEL;  Surgeon: Carmela Hurt, MD;  Location: [facility];  Service: Neurosurgery;  Laterality: Left;  LEFT Lumbar Five-Sacral One Laminectomy for Decompression      OB History   No obstetric history on file.     Family History  Problem Relation Age of Onset  . Hypertension Mother   . Cancer Father   . Hypertension Father   . Colon cancer Neg Hx     Social History   Tobacco Use  . Smoking status: Former Smoker    Quit date: 2010    Years since  quitting: 11.4  . Smokeless tobacco: Never Used  Vaping Use  . Vaping Use: Never used  Substance Use Topics  . Alcohol use: No  . Drug use: No    Home Medications Prior to Admission medications   Medication Sig Start Date End Date Taking? Authorizing Provider  citalopram (CELEXA) 20 MG tablet Take 0.5 tablets (10 mg total) by mouth daily. 07/05/19  Yes Remus Loffler, PA-C  desogestrel-ethinyl estradiol (VIORELE) 0.15-0.02/0.01 MG (21/5) tablet TAKE ONE (1) TABLET EACH DAY (Needs physical) Patient taking differently: Take 1 tablet by mouth daily.  11/01/19   Yes Dettinger, Elige Radon, MD  lisinopril (ZESTRIL) 20 MG tablet TAKE ONE (1) TABLET EACH DAY 08/28/19  Yes Hawks, Christy A, FNP  simvastatin (ZOCOR) 20 MG tablet Take 1 tablet (20 mg total) by mouth every evening. Patient taking differently: Take 20 mg by mouth every morning.  08/28/19  Yes Junie Spencer, FNP    Allergies    Patient has no known allergies.  Review of Systems   Review of Systems  Constitutional: Positive for fever (subjective). Negative for chills.  Respiratory: Negative for shortness of breath.   Cardiovascular: Negative for chest pain.  Gastrointestinal: Positive for abdominal pain, nausea and vomiting. Negative for constipation and diarrhea.  Genitourinary: Negative for difficulty urinating.  All other systems reviewed and are negative.   Physical Exam Updated Vital Signs BP (!) 97/56 (BP Location: Right Arm)   Pulse (!) 134   Temp 98.2 F (36.8 C)   Resp 18   Wt 92.2 kg   SpO2 98%   BMI 37.17 kg/m   Physical Exam Vitals and nursing note reviewed.  Constitutional:      Appearance: She is obese. She is not ill-appearing.  HENT:     Head: Normocephalic and atraumatic.  Eyes:     Conjunctiva/sclera: Conjunctivae normal.  Cardiovascular:     Rate and Rhythm: Normal rate and regular rhythm.     Heart sounds: Normal heart sounds.  Pulmonary:     Effort: Pulmonary effort is normal.     Breath sounds: Normal breath sounds. No wheezing, rhonchi or rales.  Abdominal:     Palpations: Abdomen is soft.     Tenderness: There is abdominal tenderness in the periumbilical area, left upper quadrant and left lower quadrant. There is no right CVA tenderness, left CVA tenderness, guarding or rebound.  Musculoskeletal:     Cervical back: Neck supple.  Skin:    General: Skin is warm and dry.  Neurological:     Mental Status: She is alert.     ED Results / Procedures / Treatments   Labs (all labs ordered are listed, but only abnormal results are displayed) Labs  Reviewed  COMPREHENSIVE METABOLIC PANEL - Abnormal; Notable for the following components:      Result Value   Sodium 133 (*)    Glucose, Bld 137 (*)    BUN 23 (*)    Creatinine, Ser 2.06 (*)    Albumin 3.1 (*)    Total Bilirubin 1.5 (*)    GFR calc non Af Amer 27 (*)    GFR calc Af Amer 32 (*)    All other components within normal limits  CBC WITH DIFFERENTIAL/PLATELET - Abnormal; Notable for the following components:   WBC 19.9 (*)    Neutro Abs 18.0 (*)    Abs Immature Granulocytes 0.45 (*)    All other components within normal limits  URINALYSIS, ROUTINE W REFLEX MICROSCOPIC - Abnormal;  Notable for the following components:   APPearance HAZY (*)    Protein, ur >=300 (*)    Leukocytes,Ua MODERATE (*)    WBC, UA >50 (*)    Bacteria, UA FEW (*)    Non Squamous Epithelial 0-5 (*)    All other components within normal limits  LACTIC ACID, PLASMA - Abnormal; Notable for the following components:   Lactic Acid, Venous 2.6 (*)    All other components within normal limits  PROTIME-INR - Abnormal; Notable for the following components:   Prothrombin Time 15.5 (*)    INR 1.3 (*)    All other components within normal limits  CULTURE, BLOOD (ROUTINE X 2)  CULTURE, BLOOD (ROUTINE X 2)  URINE CULTURE  SARS CORONAVIRUS 2 BY RT PCR (HOSPITAL ORDER, [facility])  LIPASE, BLOOD  LACTIC ACID, PLASMA  APTT  PREGNANCY, URINE    EKG None  Radiology DG Chest Port 1 View  Result Date: 12/09/2019 CLINICAL DATA:  Abdominal pain with nausea and vomiting. EXAM: PORTABLE CHEST 1 VIEW COMPARISON:  August 28, 2016 FINDINGS: Mild, diffuse chronic appearing increased lung markings are seen without evidence of acute infiltrate, pleural effusion or pneumothorax. The heart size and mediastinal contours are within normal limits. Degenerative changes seen within the thoracic spine. IMPRESSION: No active disease. Electronically Signed   By: Virgina Norfolk M.D.   On: 12/09/2019  18:04    Procedures .Critical Care Performed by: Eustaquio Maize, PA-C Authorized by: Eustaquio Maize, PA-C   Critical care provider statement:    Critical care time (minutes):  60   Critical care was necessary to treat or prevent imminent or life-threatening deterioration of the following conditions:  Sepsis and renal failure   Critical care was time spent personally by me on the following activities:  Discussions with consultants, evaluation of patient's response to treatment, examination of patient, ordering and performing treatments and interventions, ordering and review of laboratory studies, ordering and review of radiographic studies, pulse oximetry, re-evaluation of patient's condition, obtaining history from patient or surrogate and review of old charts   (including critical care time)  Medications Ordered in ED Medications  sodium chloride 0.9 % bolus 1,000 mL (0 mLs Intravenous Stopped 12/09/19 1838)  ondansetron (ZOFRAN) injection 4 mg (4 mg Intravenous Given 12/09/19 1721)  morphine 4 MG/ML injection 4 mg (4 mg Intravenous Given 12/09/19 1721)  piperacillin-tazobactam (ZOSYN) IVPB 3.375 g (0 g Intravenous Stopped 12/09/19 1823)  sodium chloride 0.9 % bolus 1,000 mL (1,000 mLs Intravenous New Bag/Given 12/09/19 1838)    ED Course  I have reviewed the triage vital signs and the nursing notes.  Pertinent labs & imaging results that were available during my care of the patient were reviewed by me and considered in my medical decision making (see chart for details).  Clinical Course as of Dec 09 1914  Mon Dec 09, 2019  1643 WBC(!): 19.9 [MV]  1807 Lactic Acid, Venous(!!): 2.6 [MV]    Clinical Course User Index [MV] Eustaquio Maize, PA-C   MDM Rules/Calculators/A&P                          54 year old female presents to the ED today complaining of abdominal pain, nausea, vomiting x1 to 2 days.  On arrival to the ED patient is afebrile, noted to be tachycardic in the 130s,  nontachypneic.  Pressure initially 113/61 however with recheck 97/56.  On exam patient has diffuse left-sided abdominal  tenderness more specifically in the left lower quadrant versus the left upper quadrant.  No rebound or guarding.  Does appear uncomfortable on exam.  Differential diagnosis includes diverticulitis versus pancreatitis versus gastritis versus gastroenteritis versus other abdominal infection.  Patient meets SIRS criteria at this time with suspected source of infection in her abdominal.  Will obtain labs at this time.  Work-up for sepsis.   CBC with leukocytosis 19.9.  Sepsis protocol started.  Empirically started on Zosyn to cover for intra-abdominal infection.  Blood cultures obtained.  1 L normal saline fluid bolus initially ordered, will await lactic acid prior to 30 cc/kg fluids.   CMP with sodium 133. Glucose 137. No gap. Creatinine elevated at 2.06; will have to change CT scan from with contrast to without. Will need to be admitted regardless for AKI.   Lab Results  Component Value Date   CREATININE 2.06 (H) 12/09/2019   CREATININE 0.90 08/28/2019   CREATININE 0.87 05/03/2018   Lactic acid 2.6. Pt treated with additional fluid bolus with total of 2L for IBW given BMI > 35.  Lipase within normal limits at 15.   CXR added to rule out infection prior to CT scan - negative U/A has returned with > 50 WBCS and moderate leuks. Pt denies urinary symptoms at this time.   7:16 PM At shift change case signed out to Burgess Amor, PA-C, who will admit patient after CT scan for sepsis.   Final Clinical Impression(s) / ED Diagnoses Final diagnoses:  Sepsis with acute renal failure, due to unspecified organism, unspecified acute renal failure type, unspecified whether septic shock present [facility])    Rx / DC Orders ED Discharge Orders    None       Tanda Rockers, PA-C 12/09/19 1916    Derwood Kaplan, MD 12/09/19 2214

## 2019-12-10 ENCOUNTER — Inpatient Hospital Stay (HOSPITAL_COMMUNITY): Payer: Managed Care, Other (non HMO) | Admitting: Certified Registered Nurse Anesthetist

## 2019-12-10 ENCOUNTER — Other Ambulatory Visit: Payer: Self-pay

## 2019-12-10 ENCOUNTER — Inpatient Hospital Stay (HOSPITAL_COMMUNITY): Payer: Managed Care, Other (non HMO)

## 2019-12-10 ENCOUNTER — Encounter (HOSPITAL_COMMUNITY): Payer: Self-pay | Admitting: Nephrology

## 2019-12-10 LAB — BASIC METABOLIC PANEL
Anion gap: 10 (ref 5–15)
BUN: 25 mg/dL — ABNORMAL HIGH (ref 6–20)
CO2: 18 mmol/L — ABNORMAL LOW (ref 22–32)
Calcium: 8.2 mg/dL — ABNORMAL LOW (ref 8.9–10.3)
Chloride: 107 mmol/L (ref 98–111)
Creatinine, Ser: 1.66 mg/dL — ABNORMAL HIGH (ref 0.44–1.00)
GFR calc Af Amer: 41 mL/min — ABNORMAL LOW (ref >60)
GFR calc non Af Amer: 35 mL/min — ABNORMAL LOW (ref >60)
Glucose, Bld: 160 mg/dL — ABNORMAL HIGH (ref 70–99)
Potassium: 4 mmol/L (ref 3.5–5.1)
Sodium: 135 mmol/L (ref 135–145)

## 2019-12-10 LAB — CBC
HCT: 32.2 % — ABNORMAL LOW (ref 36.0–46.0)
Hemoglobin: 10.5 g/dL — ABNORMAL LOW (ref 12.0–15.0)
MCH: 31.6 pg (ref 26.0–34.0)
MCHC: 32.6 g/dL (ref 30.0–36.0)
MCV: 97 fL (ref 80.0–100.0)
Platelets: 197 10*3/uL (ref 150–400)
RBC: 3.32 MIL/uL — ABNORMAL LOW (ref 3.87–5.11)
RDW: 13.9 % (ref 11.5–15.5)
WBC: 14.4 10*3/uL — ABNORMAL HIGH (ref 4.0–10.5)
nRBC: 0 % (ref 0.0–0.2)

## 2019-12-10 MED ORDER — OXYCODONE HCL 5 MG/5ML PO SOLN: 5.0000 mg | Freq: Once | ORAL | Status: DC | PRN

## 2019-12-10 MED ORDER — PROPOFOL 10 MG/ML IV BOLUS
INTRAVENOUS | Status: DC | PRN
  Administered 2019-12-10: 150 mg via INTRAVENOUS

## 2019-12-10 MED ORDER — 0.9 % SODIUM CHLORIDE (POUR BTL) OPTIME
TOPICAL | Status: DC | PRN
  Administered 2019-12-10: 1000 mL

## 2019-12-10 MED ORDER — HYDROMORPHONE HCL 1 MG/ML IJ SOLN: 0.2500 mg | INTRAMUSCULAR | Status: DC | PRN

## 2019-12-10 MED ORDER — DEXAMETHASONE SODIUM PHOSPHATE 10 MG/ML IJ SOLN
INTRAMUSCULAR | Status: AC
  Filled 2019-12-10: qty 1

## 2019-12-10 MED ORDER — LIP MEDEX EX OINT
TOPICAL_OINTMENT | CUTANEOUS | Status: AC
  Filled 2019-12-10: qty 7

## 2019-12-10 MED ORDER — MIDAZOLAM HCL 2 MG/2ML IJ SOLN
INTRAMUSCULAR | Status: AC
  Filled 2019-12-10: qty 2

## 2019-12-10 MED ORDER — DESOGESTREL-ETHINYL ESTRADIOL 0.15-0.02/0.01 MG (21/5) PO TABS: 1.0000 | ORAL_TABLET | Freq: Every day | ORAL | Status: DC

## 2019-12-10 MED ORDER — CITALOPRAM HYDROBROMIDE 20 MG PO TABS
10.0000 mg | ORAL_TABLET | Freq: Every day | ORAL | Status: DC
  Administered 2019-12-10 – 2019-12-13 (×4): 10 mg via ORAL
  Filled 2019-12-10 (×4): qty 1

## 2019-12-10 MED ORDER — PROMETHAZINE HCL 25 MG/ML IJ SOLN: 6.2500 mg | INTRAMUSCULAR | Status: DC | PRN

## 2019-12-10 MED ORDER — SODIUM CHLORIDE 0.9 % IR SOLN
Status: DC | PRN
  Administered 2019-12-10: 3000 mL

## 2019-12-10 MED ORDER — ONDANSETRON HCL 4 MG/2ML IJ SOLN
INTRAMUSCULAR | Status: DC | PRN
  Administered 2019-12-10: 4 mg via INTRAVENOUS

## 2019-12-10 MED ORDER — CHLORHEXIDINE GLUCONATE CLOTH 2 % EX PADS
6.0000 | MEDICATED_PAD | Freq: Every day | CUTANEOUS | Status: DC
  Administered 2019-12-10: 6 via TOPICAL

## 2019-12-10 MED ORDER — PROPOFOL 10 MG/ML IV BOLUS
INTRAVENOUS | Status: AC
  Filled 2019-12-10: qty 20

## 2019-12-10 MED ORDER — IOHEXOL 300 MG/ML  SOLN
INTRAMUSCULAR | Status: DC | PRN
  Administered 2019-12-10: 2 mL via URETHRAL

## 2019-12-10 MED ORDER — LIDOCAINE 2% (20 MG/ML) 5 ML SYRINGE
INTRAMUSCULAR | Status: DC | PRN
  Administered 2019-12-10: 100 mg via INTRAVENOUS

## 2019-12-10 MED ORDER — FENTANYL CITRATE (PF) 100 MCG/2ML IJ SOLN
INTRAMUSCULAR | Status: DC | PRN
  Administered 2019-12-10: 50 ug via INTRAVENOUS

## 2019-12-10 MED ORDER — ONDANSETRON HCL 4 MG/2ML IJ SOLN
INTRAMUSCULAR | Status: AC
  Filled 2019-12-10: qty 2

## 2019-12-10 MED ORDER — SIMVASTATIN 20 MG PO TABS
20.0000 mg | ORAL_TABLET | Freq: Every evening | ORAL | Status: DC
  Administered 2019-12-10 – 2019-12-12 (×3): 20 mg via ORAL
  Filled 2019-12-10 (×3): qty 1

## 2019-12-10 MED ORDER — PHENYLEPHRINE HCL-NACL 10-0.9 MG/250ML-% IV SOLN
INTRAVENOUS | Status: DC | PRN
Start: 2019-12-10 — End: 2019-12-10
  Administered 2019-12-10: 40 ug/min via INTRAVENOUS

## 2019-12-10 MED ORDER — MIDAZOLAM HCL 5 MG/5ML IJ SOLN
INTRAMUSCULAR | Status: DC | PRN
  Administered 2019-12-10: 2 mg via INTRAVENOUS

## 2019-12-10 MED ORDER — PHENYLEPHRINE HCL (PRESSORS) 10 MG/ML IV SOLN
INTRAVENOUS | Status: AC
  Filled 2019-12-10: qty 1

## 2019-12-10 MED ORDER — OXYCODONE HCL 5 MG PO TABS: 5.0000 mg | ORAL_TABLET | Freq: Once | ORAL | Status: DC | PRN

## 2019-12-10 MED ORDER — DEXAMETHASONE SODIUM PHOSPHATE 10 MG/ML IJ SOLN
INTRAMUSCULAR | Status: DC | PRN
  Administered 2019-12-10: 10 mg via INTRAVENOUS

## 2019-12-10 MED ORDER — AMISULPRIDE (ANTIEMETIC) 5 MG/2ML IV SOLN: 10.0000 mg | Freq: Once | INTRAVENOUS | Status: DC | PRN

## 2019-12-10 MED ORDER — ENOXAPARIN SODIUM 40 MG/0.4ML ~~LOC~~ SOLN
40.0000 mg | Freq: Every day | SUBCUTANEOUS | Status: DC
  Administered 2019-12-10 – 2019-12-13 (×4): 40 mg via SUBCUTANEOUS
  Filled 2019-12-10 (×4): qty 0.4

## 2019-12-10 MED ORDER — FENTANYL CITRATE (PF) 100 MCG/2ML IJ SOLN
INTRAMUSCULAR | Status: AC
  Filled 2019-12-10: qty 2

## 2019-12-10 NOTE — Progress Notes (Addendum)
[facility] PROGRESS NOTE   Sandra Carroll IRC:[id number] DOB: 1966-01-13 DOA: 12/09/2019  PCP: Remus Loffler, PA-C  Brief History/Interval Summary: 54 y.o. year-old w/ hx of HTN, HL and kidney stones presented to ED w/ 1.5 day hx of L sided abdominal pain, nausea and refractory emesis (10x yesterday). She underwent CT scan which showed 4 mm obstructing distal left ureteral calculus. There was hydronephrosis. Urology was consulted. Patient was transferred to [facility].   Reason for Visit: Ureteral lithiasis with obstruction  Consultants: Urology  Procedures:  On June 22: 1.  Cystoscopy with left retrograde pyelogram and interpretation. 2.  Cystoscopy with insertion of left double-J stent.   Antibiotics: Anti-infectives (From admission, onward)   Start     Dose/Rate Route Frequency Ordered Stop   12/09/19 2300  vancomycin (VANCOREADY) IVPB 1250 mg/250 mL     Discontinue     1,250 mg 166.7 mL/hr over 90 Minutes Intravenous Every 24 hours 12/09/19 2218     12/09/19 2215  meropenem (MERREM) 1 g in sodium chloride 0.9 % 100 mL IVPB     Discontinue     1 g 200 mL/hr over 30 Minutes Intravenous Every 12 hours 12/09/19 2212     12/09/19 2200  piperacillin-tazobactam (ZOSYN) IVPB 3.375 g  Status:  Discontinued        3.375 g 12.5 mL/hr over 240 Minutes Intravenous Every 8 hours 12/09/19 2135 12/09/19 2202   12/09/19 1645  piperacillin-tazobactam (ZOSYN) IVPB 3.375 g        3.375 g 100 mL/hr over 30 Minutes Intravenous  Once 12/09/19 1642 12/09/19 1823      Subjective/Interval History: Patient states that her left-sided flank pain is better compared to last night. Still nauseated but no vomiting. Denies any chills.  ROS: No shortness of breath    Assessment/Plan:  Acute pyelonephritis complicated by ureteral stone with obstruction/sepsis present on admission Patient remains on vancomycin and cefepime. Follow-up on cultures. Patient was seen by urology and  underwent cystoscopy and stent placement last night. Patient is feeling better from a symptom standpoint. She is noted to have a Foley catheter in place.  Acute kidney injury Most likely due to dehydration, ureteral stone, along with the use of ACE inhibitor. Patient was hydrated. Renal function is better this morning. Continue to monitor urine output.  Normocytic anemia Drop in hemoglobin is dilutional. Continue to monitor. No evidence of overt bleeding.  Essential hypertension Holding her blood pressure medications. She was hypotensive yesterday. Blood pressure is remain low but stable. She is asymptomatic.  Obesity Estimated body mass index is 37.17 kg/m as calculated from the following:   Height as of 08/28/19: 5\' 2"  (1.575 m).   Weight as of this encounter: 92.2 kg.   DVT Prophylaxis: Initiate Lovenox Code Status: Full code Family Communication: Discussed with the patient Disposition Plan:  Status is: Inpatient  Remains inpatient appropriate because:IV treatments appropriate due to intensity of illness or inability to take PO   Dispo: The patient is from: Home              Anticipated d/c is to: Home              Anticipated d/c date is: 2 days              Patient currently is not medically stable to d/c.     Medications:  Scheduled: . Chlorhexidine Gluconate Cloth  6 each Topical Daily  . citalopram  10 mg Oral  Daily  . desogestrel-ethinyl estradiol  1 tablet Oral Daily  . simvastatin  20 mg Oral QPM   Continuous: . sodium chloride 100 mL/hr at 12/10/19 0736  . meropenem (MERREM) IV 1 g (12/10/19 0944)  . vancomycin 1,250 mg (12/09/19 2247)   PIR:JJOACZYSAYTKZ (DILAUDID) injection, ondansetron **OR** ondansetron (ZOFRAN) IV, polyethylene glycol, traMADol   Objective:  Vital Signs  Vitals:   12/10/19 0300 12/10/19 0400 12/10/19 0445 12/10/19 0513  BP: 94/60 (!) 92/58 90/62 100/62  Pulse: 99 90 91 88  Resp: 16 17 16 18   Temp:   97.7 F (36.5 C) 97.8 F  (36.6 C)  TempSrc:    Oral  SpO2: 98% 97% 97% 97%  Weight:        Intake/Output Summary (Last 24 hours) at 12/10/2019 1057 Last data filed at 12/10/2019 0641 Gross per 24 hour  Intake 2230 ml  Output 620 ml  Net 1610 ml   Filed Weights   12/09/19 1450  Weight: 92.2 kg    General appearance: Awake alert.  In no distress Resp: Clear to auscultation bilaterally.  Normal effort Cardio: S1-S2 is normal regular.  No S3-S4.  No rubs murmurs or bruit GI: Abdomen is soft.  Nontender nondistended.  Bowel sounds are present normal.  No masses organomegaly Extremities: No edema.  Full range of motion of lower extremities. Neurologic: Alert and oriented x3.  No focal neurological deficits.    Lab Results:  Data Reviewed: I have personally reviewed following labs and imaging studies  CBC: Recent Labs  Lab 12/09/19 1616 12/10/19 0744  WBC 19.9* 14.4*  NEUTROABS 18.0*  --   HGB 12.9 10.5*  HCT 39.4 32.2*  MCV 96.1 97.0  PLT 260 197    Basic Metabolic Panel: Recent Labs  Lab 12/09/19 1616 12/10/19 0744  NA 133* 135  K 3.5 4.0  CL 99 107  CO2 22 18*  GLUCOSE 137* 160*  BUN 23* 25*  CREATININE 2.06* 1.66*  CALCIUM 9.2 8.2*    GFR: Estimated Creatinine Clearance: 42.3 mL/min (A) (by C-G formula based on SCr of 1.66 mg/dL (H)).  Liver Function Tests: Recent Labs  Lab 12/09/19 1616  AST 22  ALT 18  ALKPHOS 59  BILITOT 1.5*  PROT 7.1  ALBUMIN 3.1*    Recent Labs  Lab 12/09/19 1616  LIPASE 15    Coagulation Profile: Recent Labs  Lab 12/09/19 1616  INR 1.3*      Recent Results (from the past 240 hour(s))  Blood Culture (routine x 2)     Status: None (Preliminary result)   Collection Time: 12/09/19  4:52 PM   Specimen: BLOOD RIGHT HAND  Result Value Ref Range Status   Specimen Description BLOOD RIGHT HAND  Final   Special Requests   Final    BOTTLES DRAWN AEROBIC AND ANAEROBIC Blood Culture adequate volume   Culture   Final    NO GROWTH < 24  HOURS Performed at [facility], [address], [place], [place]    Report Status PENDING  Incomplete  Blood Culture (routine x 2)     Status: None (Preliminary result)   Collection Time: 12/09/19  5:00 PM   Specimen: BLOOD LEFT HAND  Result Value Ref Range Status   Specimen Description BLOOD LEFT HAND  Final   Special Requests   Final    BOTTLES DRAWN AEROBIC AND ANAEROBIC Blood Culture adequate volume   Culture   Final    NO GROWTH < 24 HOURS Performed  at [facility], [address] [place], [place] [address]    Report Status PENDING  Incomplete  SARS Coronavirus 2 by RT PCR (hospital order, performed in [facility] hospital lab) Nasopharyngeal Nasopharyngeal Swab     Status: None   Collection Time: 12/09/19  6:10 PM   Specimen: Nasopharyngeal Swab  Result Value Ref Range Status   SARS Coronavirus 2 NEGATIVE NEGATIVE Final    Comment: (NOTE) SARS-CoV-2 target nucleic acids are NOT DETECTED.  The SARS-CoV-2 RNA is generally detectable in upper and lower respiratory specimens during the acute phase of infection. The lowest concentration of SARS-CoV-2 viral copies this assay can detect is 250 copies / mL. A negative result does not preclude SARS-CoV-2 infection and should not be used as the sole basis for treatment or other patient management decisions.  A negative result may occur with improper specimen collection / handling, submission of specimen other than nasopharyngeal swab, presence of viral mutation(s) within the areas targeted by this assay, and inadequate number of viral copies (<250 copies / mL). A negative result must be combined with clinical observations, patient history, and epidemiological information.  Fact Sheet for Patients:   BoilerBrush.com.cy  Fact Sheet for Healthcare Providers: https://pope.com/  This test is not yet approved or  cleared by the [place] FDA and has been authorized for  detection and/or diagnosis of SARS-CoV-2 by FDA under an Emergency Use Authorization (EUA).  This EUA will remain in effect (meaning this test can be used) for the duration of the COVID-19 declaration under Section 564(b)(1) of the Act, 21 U.S.C. section 360bbb-3(b)(1), unless the authorization is terminated or revoked sooner.  Performed at [facility], [address], [place], [place] [address]       Radiology Studies: CT ABDOMEN PELVIS WO CONTRAST  Result Date: 12/09/2019 CLINICAL DATA:  Left abdominal pain, vomiting EXAM: CT ABDOMEN AND PELVIS WITHOUT CONTRAST TECHNIQUE: Multidetector CT imaging of the abdomen and pelvis was performed following the standard protocol without IV contrast. COMPARISON:  2019 FINDINGS: Lower chest: Mild bibasilar atelectasis/scarring. Hepatobiliary: No focal liver lesion.  Gallbladder is contracted. Pancreas: Unremarkable. Spleen: Unremarkable. Adrenals/Urinary Tract: Adrenals are unremarkable. There is left perinephric stranding and left hydronephrosis. Left hydroureter with surrounding inflammatory change. An obstructing 4 mm calculus is present within the distal ureter. Right kidney is unremarkable. The bladder is unremarkable. Stomach/Bowel: Small hiatal hernia. Stomach is otherwise unremarkable. Bowel is normal in caliber. Vascular/Lymphatic: Mild aortic atherosclerosis. Pathologically enlarged lymph nodes. Reproductive: Uterus and bilateral adnexa are unremarkable. Other: Fat containing left inguinal hernia. Musculoskeletal: Degenerative changes of the spine. No acute osseous abnormality IMPRESSION: 4 mm obstructing distal left ureteral calculus with proximal hydroureteronephrosis. Electronically Signed   By: Guadlupe Spanish M.D.   On: 12/09/2019 19:55   DG Chest Port 1 View  Result Date: 12/09/2019 CLINICAL DATA:  Abdominal pain with nausea and vomiting. EXAM: PORTABLE CHEST 1 VIEW COMPARISON:  August 28, 2016 FINDINGS: Mild, diffuse chronic appearing increased  lung markings are seen without evidence of acute infiltrate, pleural effusion or pneumothorax. The heart size and mediastinal contours are within normal limits. Degenerative changes seen within the thoracic spine. IMPRESSION: No active disease. Electronically Signed   By: Aram Candela M.D.   On: 12/09/2019 18:04   DG C-Arm 1-60 Min  Result Date: 12/10/2019 CLINICAL DATA:  Surgery, intraoperative left stent placement. EXAM: DG C-ARM 1-60 MIN CONTRAST:  Not specified. FLUOROSCOPY TIME:  Fluoroscopy Time:  6 seconds Radiation Exposure Index (if provided by the fluoroscopic device):  2.7 mGy Number of Acquired Spot Images: Single spot view as well as cine clip. COMPARISON:  Abdominal CT yesterday. FINDINGS: Cine clip demonstrates contrast opacifying the left ureter. Filling defect in the left ureter at site of calculus on CT. Single spot view demonstrates placement of a ureteric stent with loop in the renal pelvis. IMPRESSION: Procedural fluoroscopy during left ureteral stent placement. Electronically Signed   By: Keith Rake M.D.   On: 12/10/2019 02:15       LOS: 1 day   [facility] Pager on [email or web address]  12/10/2019, 10:57 AM

## 2019-12-10 NOTE — H&P (View-Only) (Signed)
Subjective: 1. Sepsis with acute renal failure, due to unspecified organism, unspecified acute renal failure type, unspecified whether septic shock present (HCC)     CC: Left flank pain and fever.  Consult requested by  Burgess AmorJulie Idol PA.   Hx: Sandra Carroll is a 54 yo female with a history of stones who presented to the ER on 6/21 in [place] with fever, chills, left flank pain and nausea with vomiting.   A CT showed a 4mm left proximal stone with obstruction and she exhibited signs and symptoms of sepsis but was improving on Zosyn and with IV fluids.  [facility] were [facility] and will have cystoscopy and left ureteral stenting here.  She has a history of stones with ESWL x 2 and prior stents. She had some dysuria this AM.   Her UA looks infected.  She has a Cr of 2.06.  Her lactate was 2.6 but has fallen to 1.7.  She is COVID negative.  ROS:  Review of Systems  All other systems reviewed and are negative.   No Known Allergies  Past Medical History:  Diagnosis Date  . Chronic back pain   . Complication of anesthesia 1991 and 1998   childbirth --reaction to anesthesia was itching.   . DDD (degenerative disc disease)   . Hemorrhoids   . Hyperlipidemia   . Hypertension   . PONV (postoperative nausea and vomiting)   . Renal disorder    kidney stones  . Shortness of breath    quit smoking two years ago and still has some sob with exertion     Past Surgical History:  Procedure Laterality Date  . ADENOIDECTOMY  1978  . CESAREAN SECTION  1991 1998   pt had itching with 1991 c-section.   . COLONOSCOPY N/A 03/01/2013   Dr. Jena Gaussourk- normal rectum except for anal canal/internal hemorrhoidal tags. colonic mucosa appeared normal.  . KNEE ARTHROSCOPY  2003  . LITHOTRIPSY    . LUMBAR LAMINECTOMY/DECOMPRESSION MICRODISCECTOMY  03/02/2012   Procedure: LUMBAR LAMINECTOMY/DECOMPRESSION MICRODISCECTOMY 1 LEVEL;  Surgeon: Carmela HurtKyle L Cabbell, MD;   Location: [facility];  Service: Neurosurgery;  Laterality: Left;  LEFT Lumbar Five-Sacral One Laminectomy for Decompression     Social History   Socioeconomic History  . Marital status: Single    Spouse name: Not on file  . Number of children: Not on file  . Years of education: Not on file  . Highest education level: Not on file  Occupational History  . Not on file  Tobacco Use  . Smoking status: Former Smoker    Quit date: 2010    Years since quitting: 11.4  . Smokeless tobacco: Never Used  Vaping Use  . Vaping Use: Never used  Substance and Sexual Activity  . Alcohol use: No  . Drug use: No  . Sexual activity: Never  Other Topics Concern  . Not on file  Social History Narrative  . Not on file   Social Determinants of Health   Financial Resource Strain:   . Difficulty of Paying Living Expenses:   Food Insecurity:   . Worried About Programme researcher, broadcasting/film/videounning Out of Food in the Last Year:   . Baristaan Out of Food in the Last Year:   Transportation Needs:   . Freight forwarderLack of Transportation (Medical):   Marland Kitchen. Lack of Transportation (Non-Medical):   Physical Activity:   . Days of Exercise per Week:   . Minutes of Exercise per  Session:   Stress:   . Feeling of Stress :   Social Connections:   . Frequency of Communication with Friends and Family:   . Frequency of Social Gatherings with Friends and Family:   . Attends Religious Services:   . Active Member of Clubs or Organizations:   . Attends Archivist Meetings:   Marland Kitchen Marital Status:   Intimate Partner Violence:   . Fear of Current or Ex-Partner:   . Emotionally Abused:   Marland Kitchen Physically Abused:   . Sexually Abused:     Family History  Problem Relation Age of Onset  . Hypertension Mother   . Cancer Father   . Hypertension Father   . Colon cancer Neg Hx     Anti-infectives: Anti-infectives (From admission, onward)   Start     Dose/Rate Route Frequency Ordered Stop   12/09/19 2300  vancomycin (VANCOREADY) IVPB 1250 mg/250 mL      Discontinue     1,250 mg 166.7 mL/hr over 90 Minutes Intravenous Every 24 hours 12/09/19 2218     12/09/19 2215  meropenem (MERREM) 1 g in sodium chloride 0.9 % 100 mL IVPB     Discontinue     1 g 200 mL/hr over 30 Minutes Intravenous Every 12 hours 12/09/19 2212     12/09/19 2200  piperacillin-tazobactam (ZOSYN) IVPB 3.375 g  Status:  Discontinued        3.375 g 12.5 mL/hr over 240 Minutes Intravenous Every 8 hours 12/09/19 2135 12/09/19 2202   12/09/19 1645  piperacillin-tazobactam (ZOSYN) IVPB 3.375 g        3.375 g 100 mL/hr over 30 Minutes Intravenous  Once 12/09/19 1642 12/09/19 1823      Current Facility-Administered Medications  Medication Dose Route Frequency Provider Last Rate Last Admin  . 0.9 %  sodium chloride infusion   Intravenous Continuous Roney Jaffe, MD 500 mL/hr at 12/09/19 2157 Rate Change at 12/09/19 2157  . HYDROmorphone (DILAUDID) injection 0.5-1 mg  0.5-1 mg Intravenous Q2H PRN Roney Jaffe, MD   0.5 mg at 12/09/19 2250  . meropenem (MERREM) 1 g in sodium chloride 0.9 % 100 mL IVPB  1 g Intravenous Q12H Efraim Kaufmann, RPH 200 mL/hr at 12/09/19 2241 1 g at 12/09/19 2241  . ondansetron (ZOFRAN) tablet 4 mg  4 mg Oral Q6H PRN Roney Jaffe, MD       Or  . ondansetron [facility]) injection 4 mg  4 mg Intravenous Q6H PRN Roney Jaffe, MD      . polyethylene glycol (MIRALAX / GLYCOLAX) packet 17 g  17 g Oral Daily PRN Roney Jaffe, MD      . traMADol Veatrice Bourbon) tablet 50 mg  50 mg Oral Q8H PRN Roney Jaffe, MD      . vancomycin (VANCOREADY) IVPB 1250 mg/250 mL  1,250 mg Intravenous Q24H Efraim Kaufmann, RPH 166.7 mL/hr at 12/09/19 2247 1,250 mg at 12/09/19 2247   Current Outpatient Medications  Medication Sig Dispense Refill  . citalopram (CELEXA) 20 MG tablet Take 0.5 tablets (10 mg total) by mouth daily. 30 tablet 2  . desogestrel-ethinyl estradiol (VIORELE) 0.15-0.02/0.01 MG (21/5) tablet TAKE ONE (1) TABLET EACH DAY (Needs physical) (Patient  taking differently: Take 1 tablet by mouth daily. ) 28 tablet 0  . lisinopril (ZESTRIL) 20 MG tablet TAKE ONE (1) TABLET EACH DAY 30 tablet 11  . simvastatin (ZOCOR) 20 MG tablet Take 1 tablet (20 mg total) by mouth every evening. (Patient taking differently: Take 20  mg by mouth every morning. ) 30 tablet 11     Objective: Vital signs in last 24 hours: BP (!) 84/50   Pulse (!) 113   Temp 98.2 F (36.8 C)   Resp 13   Wt 92.2 kg   SpO2 94%   BMI 37.17 kg/m   Intake/Output from previous day: 06/21 0701 - 06/22 0700 In: 1050 [IV Piggyback:1050] Out: -  Intake/Output this shift: No intake/output data recorded.   Physical Exam Vitals reviewed.  Constitutional:      Appearance: She is well-developed. She is obese. She is ill-appearing.  HENT:     Head: Normocephalic and atraumatic.  Cardiovascular:     Rate and Rhythm: Regular rhythm. Tachycardia present.     Heart sounds: Normal heart sounds.  Pulmonary:     Effort: Pulmonary effort is normal. No respiratory distress.     Breath sounds: Normal breath sounds.  Abdominal:     General: Abdomen is protuberant.     Palpations: Abdomen is soft.     Tenderness: There is generalized abdominal tenderness and tenderness in the left upper quadrant. There is left CVA tenderness.  Musculoskeletal:        General: No swelling or tenderness. Normal range of motion.  Skin:    General: Skin is warm and dry.  Neurological:     General: No focal deficit present.     Mental Status: She is alert and oriented to person, place, and time.  Psychiatric:        Mood and Affect: Mood normal.        Behavior: Behavior normal.     Lab Results:  Results for orders placed or performed during the hospital encounter of 12/09/19 (from the past 24 hour(s))  Comprehensive metabolic panel     Status: Abnormal   Collection Time: 12/09/19  4:16 PM  Result Value Ref Range   Sodium 133 (L) 135 - 145 mmol/L   Potassium 3.5 3.5 - 5.1 mmol/L   Chloride  99 98 - 111 mmol/L   CO2 22 22 - 32 mmol/L   Glucose, Bld 137 (H) 70 - 99 mg/dL   BUN 23 (H) 6 - 20 mg/dL   Creatinine, Ser 6.96 (H) 0.44 - 1.00 mg/dL   Calcium 9.2 8.9 - 29.5 mg/dL   Total Protein 7.1 6.5 - 8.1 g/dL   Albumin 3.1 (L) 3.5 - 5.0 g/dL   AST 22 15 - 41 U/L   ALT 18 0 - 44 U/L   Alkaline Phosphatase 59 38 - 126 U/L   Total Bilirubin 1.5 (H) 0.3 - 1.2 mg/dL   GFR calc non Af Amer 27 (L) >60 mL/min   GFR calc Af Amer 32 (L) >60 mL/min   Anion gap 12 5 - 15  Lipase, blood     Status: None   Collection Time: 12/09/19  4:16 PM  Result Value Ref Range   Lipase 15 11 - 51 U/L  CBC with Differential     Status: Abnormal   Collection Time: 12/09/19  4:16 PM  Result Value Ref Range   WBC 19.9 (H) 4.0 - 10.5 K/uL   RBC 4.10 3.87 - 5.11 MIL/uL   Hemoglobin 12.9 12.0 - 15.0 g/dL   HCT 28.4 36 - 46 %   MCV 96.1 80.0 - 100.0 fL   MCH 31.5 26.0 - 34.0 pg   MCHC 32.7 30.0 - 36.0 g/dL   RDW 13.2 44.0 - 10.2 %   Platelets 260 150 -  400 K/uL   nRBC 0.0 0.0 - 0.2 %   Neutrophils Relative % 91 %   Neutro Abs 18.0 (H) 1.7 - 7.7 K/uL   Lymphocytes Relative 4 %   Lymphs Abs 0.8 0.7 - 4.0 K/uL   Monocytes Relative 3 %   Monocytes Absolute 0.6 0 - 1 K/uL   Eosinophils Relative 0 %   Eosinophils Absolute 0.0 0 - 0 K/uL   Basophils Relative 0 %   Basophils Absolute 0.1 0 - 0 K/uL   Immature Granulocytes 2 %   Abs Immature Granulocytes 0.45 (H) 0.00 - 0.07 K/uL  Urinalysis, Routine w reflex microscopic     Status: Abnormal   Collection Time: 12/09/19  4:16 PM  Result Value Ref Range   Color, Urine YELLOW YELLOW   APPearance HAZY (A) CLEAR   Specific Gravity, Urine 1.018 1.005 - 1.030   pH 5.0 5.0 - 8.0   Glucose, UA NEGATIVE NEGATIVE mg/dL   Hgb urine dipstick NEGATIVE NEGATIVE   Bilirubin Urine NEGATIVE NEGATIVE   Ketones, ur NEGATIVE NEGATIVE mg/dL   Protein, ur >=062 (A) NEGATIVE mg/dL   Nitrite NEGATIVE NEGATIVE   Leukocytes,Ua MODERATE (A) NEGATIVE   RBC / HPF 6-10 0 - 5  RBC/hpf   WBC, UA >50 (H) 0 - 5 WBC/hpf   Bacteria, UA FEW (A) NONE SEEN   Squamous Epithelial / LPF 11-20 0 - 5   WBC Clumps PRESENT    Hyaline Casts, UA PRESENT    Non Squamous Epithelial 0-5 (A) NONE SEEN  APTT     Status: None   Collection Time: 12/09/19  4:16 PM  Result Value Ref Range   aPTT 32 24 - 36 seconds  Protime-INR     Status: Abnormal   Collection Time: 12/09/19  4:16 PM  Result Value Ref Range   Prothrombin Time 15.5 (H) 11.4 - 15.2 seconds   INR 1.3 (H) 0.8 - 1.2  Pregnancy, urine     Status: None   Collection Time: 12/09/19  4:44 PM  Result Value Ref Range   Preg Test, Ur NEGATIVE NEGATIVE  Lactic acid, plasma     Status: Abnormal   Collection Time: 12/09/19  4:51 PM  Result Value Ref Range   Lactic Acid, Venous 2.6 (HH) 0.5 - 1.9 mmol/L  Blood Culture (routine x 2)     Status: None (Preliminary result)   Collection Time: 12/09/19  4:52 PM   Specimen: BLOOD RIGHT HAND  Result Value Ref Range   Specimen Description BLOOD RIGHT HAND    Special Requests      BOTTLES DRAWN AEROBIC AND ANAEROBIC Blood Culture adequate volume Performed at [facility], [address] [place], [place] [address]    Culture PENDING    Report Status PENDING   Blood Culture (routine x 2)     Status: None (Preliminary result)   Collection Time: 12/09/19  5:00 PM   Specimen: BLOOD LEFT HAND  Result Value Ref Range   Specimen Description BLOOD LEFT HAND    Special Requests      BOTTLES DRAWN AEROBIC AND ANAEROBIC Blood Culture adequate volume Performed at [facility], [address] [place], [place] [address]    Culture PENDING    Report Status PENDING   SARS Coronavirus 2 by RT PCR (hospital order, performed in [facility] hospital lab) Nasopharyngeal Nasopharyngeal Swab     Status: None   Collection Time: 12/09/19  6:10 PM   Specimen: Nasopharyngeal Swab  Result Value Ref  Range   SARS Coronavirus 2 NEGATIVE NEGATIVE  Lactic acid, plasma     Status: None   Collection Time:  12/09/19  6:33 PM  Result Value Ref Range   Lactic Acid, Venous 1.7 0.5 - 1.9 mmol/L    BMET Recent Labs    12/09/19 1616  NA 133*  K 3.5  CL 99  CO2 22  GLUCOSE 137*  BUN 23*  CREATININE 2.06*  CALCIUM 9.2   PT/INR Recent Labs    12/09/19 1616  LABPROT 15.5*  INR 1.3*   ABG No results for input(s): PHART, HCO3 in the last 72 hours.  Invalid input(s): PCO2, PO2  Studies/Results: CT ABDOMEN PELVIS WO CONTRAST  Result Date: 12/09/2019 CLINICAL DATA:  Left abdominal pain, vomiting EXAM: CT ABDOMEN AND PELVIS WITHOUT CONTRAST TECHNIQUE: Multidetector CT imaging of the abdomen and pelvis was performed following the standard protocol without IV contrast. COMPARISON:  2019 FINDINGS: Lower chest: Mild bibasilar atelectasis/scarring. Hepatobiliary: No focal liver lesion.  Gallbladder is contracted. Pancreas: Unremarkable. Spleen: Unremarkable. Adrenals/Urinary Tract: Adrenals are unremarkable. There is left perinephric stranding and left hydronephrosis. Left hydroureter with surrounding inflammatory change. An obstructing 4 mm calculus is present within the distal ureter. Right kidney is unremarkable. The bladder is unremarkable. Stomach/Bowel: Small hiatal hernia. Stomach is otherwise unremarkable. Bowel is normal in caliber. Vascular/Lymphatic: Mild aortic atherosclerosis. Pathologically enlarged lymph nodes. Reproductive: Uterus and bilateral adnexa are unremarkable. Other: Fat containing left inguinal hernia. Musculoskeletal: Degenerative changes of the spine. No acute osseous abnormality IMPRESSION: 4 mm obstructing distal left ureteral calculus with proximal hydroureteronephrosis. Electronically Signed   By: Guadlupe Spanish M.D.   On: 12/09/2019 19:55   DG Chest Port 1 View  Result Date: 12/09/2019 CLINICAL DATA:  Abdominal pain with nausea and vomiting. EXAM: PORTABLE CHEST 1 VIEW COMPARISON:  August 28, 2016 FINDINGS: Mild, diffuse chronic appearing increased lung markings are seen  without evidence of acute infiltrate, pleural effusion or pneumothorax. The heart size and mediastinal contours are within normal limits. Degenerative changes seen within the thoracic spine. IMPRESSION: No active disease. Electronically Signed   By: Aram Candela M.D.   On: 12/09/2019 18:04   Case discussed with Burgess Amor.  I have reviewed the imaging, labs and notes.  Assessment/Plan: Left proximal stone with sepsis.  She will be taken for cystoscopy with left ureteral stenting.  Risks of bleeding, infection, ureteral injury, need for secondary procedures, thrombotic events and anesthetic complications reviewed.           No follow-ups on file.    CC: Burgess Amor PA    Bjorn Pippin 12/10/2019 [phone]

## 2019-12-10 NOTE — Discharge Instructions (Signed)

## 2019-12-10 NOTE — Anesthesia Preprocedure Evaluation (Signed)
Anesthesia Evaluation  Patient identified by MRN, date of birth, ID band Patient awake    Reviewed: Allergy & Precautions, NPO status , Patient's Chart, lab work & pertinent test results  History of Anesthesia Complications (+) PONV and history of anesthetic complications  Airway Mallampati: II  TM Distance: >3 FB Neck ROM: Full    Dental no notable dental hx. (+) Teeth Intact, Dental Advisory Given   Pulmonary neg pulmonary ROS, neg shortness of breath, former smoker,    Pulmonary exam normal breath sounds clear to auscultation       Cardiovascular hypertension, negative cardio ROS Normal cardiovascular exam Rhythm:Regular Rate:Normal     Neuro/Psych Anxiety Depression Chronic back pain: narcotics  CLINICAL DATA:  Lumbosacral spondylosis without myelopathy. Displacement of the L4-5 and L5-S1 lumbar disc. The patient had significant relief of pain from a prior injection.  Her pain has since recurred.  negative neurological ROS  negative psych ROS   GI/Hepatic negative GI ROS, Neg liver ROS,   Endo/Other  negative endocrine ROSMorbid obesity  Renal/GU negative Renal ROS  negative genitourinary   Musculoskeletal negative musculoskeletal ROS (+) Arthritis , Osteoarthritis,    Abdominal (+) + obese,   Peds negative pediatric ROS (+)  Hematology negative hematology ROS (+)   Anesthesia Other Findings   Reproductive/Obstetrics negative OB ROS BTL                             Anesthesia Physical  Anesthesia Plan  ASA: II and emergent  Anesthesia Plan: General   Post-op Pain Management:    Induction: Intravenous  PONV Risk Score and Plan: 4 or greater and Ondansetron, Midazolam, Dexamethasone, Droperidol and Treatment may vary due to age or medical condition  Airway Management Planned: LMA  Additional Equipment:   Intra-op Plan:   Post-operative Plan: Extubation in  OR  Informed Consent: I have reviewed the patients History and Physical, chart, labs and discussed the procedure including the risks, benefits and alternatives for the proposed anesthesia with the patient or authorized representative who has indicated his/her understanding and acceptance.     Dental advisory given  Plan Discussed with: CRNA and Surgeon  Anesthesia Plan Comments:         Anesthesia Quick Evaluation

## 2019-12-10 NOTE — Op Note (Signed)
Procedure: 1.  Cystoscopy with left retrograde pyelogram and interpretation. 2.  Cystoscopy with insertion of left double-J stent.  Preop diagnosis: Left proximal ureteral stone with sepsis.  Postop diagnosis: Left mid ureteral stone with sepsis.  Surgeon: Dr. Irine Seal.  Anesthesia: General.  Specimen: None.  Drain: 6 [place] by 24 cm left contour double-J stent and 16 French Foley catheter.  EBL: None.  Complications: None.  Indications: The patient is a 54 year old female with a history of stones who presented to the emergency room yesterday with left flank pain, nausea and vomiting and fever.  She met sepsis criteria and had an abnormal urine.  A CT scan demonstrated a 4 mm left proximal stone with obstruction it was felt that stenting was indicated.  Procedure: She had been given Zosyn in the ER and received cefepime and meropenem as well from the hospitalist.  A general anesthetic was induced and she was placed in lithotomy position.  She was fitted with PAS hose.  Her perineum and genitalia were prepped with Betadine solution she was draped in usual sterile fashion.  Cystoscopy was performed using a 23 [place] scope and 30 degree lens.  Examination revealed a normal urethra.  The bladder urine was very concentrated.  The bladder wall was smooth and pale without tumors, stones or significant inflammation.  There was some mild squamous metaplasia adjacent to the ureteral orifices bilaterally but they were otherwise unremarkable.  The left ureteral orifice was cannulated with 5 French opening catheter and contrast was gently instilled.  This demonstrated normal caliber ureter up to a filling defect in the mid ureter consistent with her stone there was no proximal dilation.  A sensor wire was then passed to the kidney under fluoroscopic guidance and the open-ended catheter was removed.  A 6 French by 24 cm contour double-J stent was then inserted over the wire to the kidney under  fluoroscopic guidance.  The wire was removed, leaving good coil in the kidney and a good coil in the bladder.  The cystoscope was removed and a 16 French Foley catheter was inserted.  The balloon was filled with 10 mL of sterile fluid.  The catheter was placed to straight drainage.  She was taken down from lithotomy position, her anesthetic was reversed and she was moved to recovery in stable condition.  There were no complications.Marland Kitchen

## 2019-12-10 NOTE — Anesthesia Postprocedure Evaluation (Signed)
Anesthesia Post Note  Patient: Sandra Carroll  Procedure(s) Performed: CYSTOSCOPY WITH RETROGRADE PYELOGRAM/URETERAL STENT PLACEMENT (Left Ureter)     Patient location during evaluation: PACU Anesthesia Type: General Level of consciousness: awake and alert Pain management: pain level controlled Vital Signs Assessment: post-procedure vital signs reviewed and stable Respiratory status: spontaneous breathing, nonlabored ventilation and respiratory function stable Cardiovascular status: blood pressure returned to baseline and stable Postop Assessment: no apparent nausea or vomiting Anesthetic complications: no   No complications documented.  Last Vitals:  Vitals:   12/10/19 0126 12/10/19 0130  BP: (!) 102/52 (!) 93/58  Pulse: (!) 117 (!) 117  Resp: 18 19  Temp: 37.8 C   SpO2: 98% 92%    Last Pain:  Vitals:   12/10/19 0130  PainSc: 0-No pain                 Lowella Curb

## 2019-12-10 NOTE — Anesthesia Procedure Notes (Signed)
Procedure Name: LMA Insertion Date/Time: 12/10/2019 12:54 AM Performed by: Epimenio Sarin, CRNA Pre-anesthesia Checklist: Patient identified, Emergency Drugs available, Suction available, Patient being monitored and Timeout performed Patient Re-evaluated:Patient Re-evaluated prior to induction Oxygen Delivery Method: Circle system utilized Preoxygenation: Pre-oxygenation with 100% oxygen Induction Type: IV induction Ventilation: Mask ventilation without difficulty LMA: LMA with gastric port inserted LMA Size: 4.0 Number of attempts: 1 Dental Injury: Teeth and Oropharynx as per pre-operative assessment

## 2019-12-10 NOTE — Plan of Care (Signed)
  Problem: Education: Goal: Knowledge of General Education information will improve Description Including pain rating scale, medication(s)/side effects and non-pharmacologic comfort measures Outcome: Progressing   

## 2019-12-10 NOTE — Transfer of Care (Signed)
Immediate Anesthesia Transfer of Care Note  Patient: Sandra Carroll  Procedure(s) Performed: CYSTOSCOPY WITH RETROGRADE PYELOGRAM/URETERAL STENT PLACEMENT (Left Ureter)  Patient Location: PACU  Anesthesia Type:General  Level of Consciousness: drowsy and patient cooperative  Airway & Oxygen Therapy: Patient Spontanous Breathing and Patient connected to face mask oxygen  Post-op Assessment: Report given to RN and Post -op Vital signs reviewed and stable  Post vital signs: Reviewed and stable  Last Vitals:  Vitals Value Taken Time  BP 102/52 12/10/19 0125  Temp    Pulse 117 12/10/19 0127  Resp 21 12/10/19 0127  SpO2 98 % 12/10/19 0127  Vitals shown include unvalidated device data.  Last Pain:  Vitals:   12/09/19 2246  PainSc: 7          Complications: No complications documented.

## 2019-12-10 NOTE — Consult Note (Addendum)
 Subjective: 1. Sepsis with acute renal failure, due to unspecified organism, unspecified acute renal failure type, unspecified whether septic shock present (HCC)     CC: Left flank pain and fever.  Consult requested by  Julie Idol PA.   Hx: Sandra Carroll is a 53 yo female with a history of stones who presented to the ER on 6/21 in [place] with fever, chills, left flank pain and nausea with vomiting.   A CT showed a 4mm left proximal stone with obstruction and she exhibited signs and symptoms of sepsis but was improving on Zosyn and with IV fluids.  She was transferred to [facility] because there were no rooms for admission at [place] and will have cystoscopy and left ureteral stenting here.  She has a history of stones with ESWL x 2 and prior stents. She had some dysuria this AM.   Her UA looks infected.  She has a Cr of 2.06.  Her lactate was 2.6 but has fallen to 1.7.  She is COVID negative.  ROS:  Review of Systems  All other systems reviewed and are negative.   No Known Allergies  Past Medical History:  Diagnosis Date  . Chronic back pain   . Complication of anesthesia 1991 and 1998   childbirth --reaction to anesthesia was itching.   . DDD (degenerative disc disease)   . Hemorrhoids   . Hyperlipidemia   . Hypertension   . PONV (postoperative nausea and vomiting)   . Renal disorder    kidney stones  . Shortness of breath    quit smoking two years ago and still has some sob with exertion     Past Surgical History:  Procedure Laterality Date  . ADENOIDECTOMY  1978  . CESAREAN SECTION  1991 1998   pt had itching with 1991 c-section.   . COLONOSCOPY N/A 03/01/2013   Dr. Rourk- normal rectum except for anal canal/internal hemorrhoidal tags. colonic mucosa appeared normal.  . KNEE ARTHROSCOPY  2003  . LITHOTRIPSY    . LUMBAR LAMINECTOMY/DECOMPRESSION MICRODISCECTOMY  03/02/2012   Procedure: LUMBAR LAMINECTOMY/DECOMPRESSION MICRODISCECTOMY 1 LEVEL;  Surgeon: Kyle L Cabbell, MD;   Location: [facility];  Service: Neurosurgery;  Laterality: Left;  LEFT Lumbar Five-Sacral One Laminectomy for Decompression     Social History   Socioeconomic History  . Marital status: Single    Spouse name: Not on file  . Number of children: Not on file  . Years of education: Not on file  . Highest education level: Not on file  Occupational History  . Not on file  Tobacco Use  . Smoking status: Former Smoker    Quit date: 2010    Years since quitting: 11.4  . Smokeless tobacco: Never Used  Vaping Use  . Vaping Use: Never used  Substance and Sexual Activity  . Alcohol use: No  . Drug use: No  . Sexual activity: Never  Other Topics Concern  . Not on file  Social History Narrative  . Not on file   Social Determinants of Health   Financial Resource Strain:   . Difficulty of Paying Living Expenses:   Food Insecurity:   . Worried About Running Out of Food in the Last Year:   . Ran Out of Food in the Last Year:   Transportation Needs:   . Lack of Transportation (Medical):   . Lack of Transportation (Non-Medical):   Physical Activity:   . Days of Exercise per Week:   . Minutes of Exercise per   Session:   Stress:   . Feeling of Stress :   Social Connections:   . Frequency of Communication with Friends and Family:   . Frequency of Social Gatherings with Friends and Family:   . Attends Religious Services:   . Active Member of Clubs or Organizations:   . Attends Archivist Meetings:   Marland Kitchen Marital Status:   Intimate Partner Violence:   . Fear of Current or Ex-Partner:   . Emotionally Abused:   Marland Kitchen Physically Abused:   . Sexually Abused:     Family History  Problem Relation Age of Onset  . Hypertension Mother   . Cancer Father   . Hypertension Father   . Colon cancer Neg Hx     Anti-infectives: Anti-infectives (From admission, onward)   Start     Dose/Rate Route Frequency Ordered Stop   12/09/19 2300  vancomycin (VANCOREADY) IVPB 1250 mg/250 mL      Discontinue     1,250 mg 166.7 mL/hr over 90 Minutes Intravenous Every 24 hours 12/09/19 2218     12/09/19 2215  meropenem (MERREM) 1 g in sodium chloride 0.9 % 100 mL IVPB     Discontinue     1 g 200 mL/hr over 30 Minutes Intravenous Every 12 hours 12/09/19 2212     12/09/19 2200  piperacillin-tazobactam (ZOSYN) IVPB 3.375 g  Status:  Discontinued        3.375 g 12.5 mL/hr over 240 Minutes Intravenous Every 8 hours 12/09/19 2135 12/09/19 2202   12/09/19 1645  piperacillin-tazobactam (ZOSYN) IVPB 3.375 g        3.375 g 100 mL/hr over 30 Minutes Intravenous  Once 12/09/19 1642 12/09/19 1823      Current Facility-Administered Medications  Medication Dose Route Frequency Provider Last Rate Last Admin  . 0.9 %  sodium chloride infusion   Intravenous Continuous Roney Jaffe, MD 500 mL/hr at 12/09/19 2157 Rate Change at 12/09/19 2157  . HYDROmorphone (DILAUDID) injection 0.5-1 mg  0.5-1 mg Intravenous Q2H PRN Roney Jaffe, MD   0.5 mg at 12/09/19 2250  . meropenem (MERREM) 1 g in sodium chloride 0.9 % 100 mL IVPB  1 g Intravenous Q12H Efraim Kaufmann, RPH 200 mL/hr at 12/09/19 2241 1 g at 12/09/19 2241  . ondansetron (ZOFRAN) tablet 4 mg  4 mg Oral Q6H PRN Roney Jaffe, MD       Or  . ondansetron [facility]) injection 4 mg  4 mg Intravenous Q6H PRN Roney Jaffe, MD      . polyethylene glycol (MIRALAX / GLYCOLAX) packet 17 g  17 g Oral Daily PRN Roney Jaffe, MD      . traMADol Veatrice Bourbon) tablet 50 mg  50 mg Oral Q8H PRN Roney Jaffe, MD      . vancomycin (VANCOREADY) IVPB 1250 mg/250 mL  1,250 mg Intravenous Q24H Efraim Kaufmann, RPH 166.7 mL/hr at 12/09/19 2247 1,250 mg at 12/09/19 2247   Current Outpatient Medications  Medication Sig Dispense Refill  . citalopram (CELEXA) 20 MG tablet Take 0.5 tablets (10 mg total) by mouth daily. 30 tablet 2  . desogestrel-ethinyl estradiol (VIORELE) 0.15-0.02/0.01 MG (21/5) tablet TAKE ONE (1) TABLET EACH DAY (Needs physical) (Patient  taking differently: Take 1 tablet by mouth daily. ) 28 tablet 0  . lisinopril (ZESTRIL) 20 MG tablet TAKE ONE (1) TABLET EACH DAY 30 tablet 11  . simvastatin (ZOCOR) 20 MG tablet Take 1 tablet (20 mg total) by mouth every evening. (Patient taking differently: Take 20  mg by mouth every morning. ) 30 tablet 11     Objective: Vital signs in last 24 hours: BP (!) 84/50   Pulse (!) 113   Temp 98.2 F (36.8 C)   Resp 13   Wt 92.2 kg   SpO2 94%   BMI 37.17 kg/m   Intake/Output from previous day: 06/21 0701 - 06/22 0700 In: 1050 [IV Piggyback:1050] Out: -  Intake/Output this shift: No intake/output data recorded.   Physical Exam Vitals reviewed.  Constitutional:      Appearance: She is well-developed. She is obese. She is ill-appearing.  HENT:     Head: Normocephalic and atraumatic.  Cardiovascular:     Rate and Rhythm: Regular rhythm. Tachycardia present.     Heart sounds: Normal heart sounds.  Pulmonary:     Effort: Pulmonary effort is normal. No respiratory distress.     Breath sounds: Normal breath sounds.  Abdominal:     General: Abdomen is protuberant.     Palpations: Abdomen is soft.     Tenderness: There is generalized abdominal tenderness and tenderness in the left upper quadrant. There is left CVA tenderness.  Musculoskeletal:        General: No swelling or tenderness. Normal range of motion.  Skin:    General: Skin is warm and dry.  Neurological:     General: No focal deficit present.     Mental Status: She is alert and oriented to person, place, and time.  Psychiatric:        Mood and Affect: Mood normal.        Behavior: Behavior normal.     Lab Results:  Results for orders placed or performed during the hospital encounter of 12/09/19 (from the past 24 hour(s))  Comprehensive metabolic panel     Status: Abnormal   Collection Time: 12/09/19  4:16 PM  Result Value Ref Range   Sodium 133 (L) 135 - 145 mmol/L   Potassium 3.5 3.5 - 5.1 mmol/L   Chloride  99 98 - 111 mmol/L   CO2 22 22 - 32 mmol/L   Glucose, Bld 137 (H) 70 - 99 mg/dL   BUN 23 (H) 6 - 20 mg/dL   Creatinine, Ser 6.96 (H) 0.44 - 1.00 mg/dL   Calcium 9.2 8.9 - 29.5 mg/dL   Total Protein 7.1 6.5 - 8.1 g/dL   Albumin 3.1 (L) 3.5 - 5.0 g/dL   AST 22 15 - 41 U/L   ALT 18 0 - 44 U/L   Alkaline Phosphatase 59 38 - 126 U/L   Total Bilirubin 1.5 (H) 0.3 - 1.2 mg/dL   GFR calc non Af Amer 27 (L) >60 mL/min   GFR calc Af Amer 32 (L) >60 mL/min   Anion gap 12 5 - 15  Lipase, blood     Status: None   Collection Time: 12/09/19  4:16 PM  Result Value Ref Range   Lipase 15 11 - 51 U/L  CBC with Differential     Status: Abnormal   Collection Time: 12/09/19  4:16 PM  Result Value Ref Range   WBC 19.9 (H) 4.0 - 10.5 K/uL   RBC 4.10 3.87 - 5.11 MIL/uL   Hemoglobin 12.9 12.0 - 15.0 g/dL   HCT 28.4 36 - 46 %   MCV 96.1 80.0 - 100.0 fL   MCH 31.5 26.0 - 34.0 pg   MCHC 32.7 30.0 - 36.0 g/dL   RDW 13.2 44.0 - 10.2 %   Platelets 260 150 -  400 K/uL   nRBC 0.0 0.0 - 0.2 %   Neutrophils Relative % 91 %   Neutro Abs 18.0 (H) 1.7 - 7.7 K/uL   Lymphocytes Relative 4 %   Lymphs Abs 0.8 0.7 - 4.0 K/uL   Monocytes Relative 3 %   Monocytes Absolute 0.6 0 - 1 K/uL   Eosinophils Relative 0 %   Eosinophils Absolute 0.0 0 - 0 K/uL   Basophils Relative 0 %   Basophils Absolute 0.1 0 - 0 K/uL   Immature Granulocytes 2 %   Abs Immature Granulocytes 0.45 (H) 0.00 - 0.07 K/uL  Urinalysis, Routine w reflex microscopic     Status: Abnormal   Collection Time: 12/09/19  4:16 PM  Result Value Ref Range   Color, Urine YELLOW YELLOW   APPearance HAZY (A) CLEAR   Specific Gravity, Urine 1.018 1.005 - 1.030   pH 5.0 5.0 - 8.0   Glucose, UA NEGATIVE NEGATIVE mg/dL   Hgb urine dipstick NEGATIVE NEGATIVE   Bilirubin Urine NEGATIVE NEGATIVE   Ketones, ur NEGATIVE NEGATIVE mg/dL   Protein, ur >=062 (A) NEGATIVE mg/dL   Nitrite NEGATIVE NEGATIVE   Leukocytes,Ua MODERATE (A) NEGATIVE   RBC / HPF 6-10 0 - 5  RBC/hpf   WBC, UA >50 (H) 0 - 5 WBC/hpf   Bacteria, UA FEW (A) NONE SEEN   Squamous Epithelial / LPF 11-20 0 - 5   WBC Clumps PRESENT    Hyaline Casts, UA PRESENT    Non Squamous Epithelial 0-5 (A) NONE SEEN  APTT     Status: None   Collection Time: 12/09/19  4:16 PM  Result Value Ref Range   aPTT 32 24 - 36 seconds  Protime-INR     Status: Abnormal   Collection Time: 12/09/19  4:16 PM  Result Value Ref Range   Prothrombin Time 15.5 (H) 11.4 - 15.2 seconds   INR 1.3 (H) 0.8 - 1.2  Pregnancy, urine     Status: None   Collection Time: 12/09/19  4:44 PM  Result Value Ref Range   Preg Test, Ur NEGATIVE NEGATIVE  Lactic acid, plasma     Status: Abnormal   Collection Time: 12/09/19  4:51 PM  Result Value Ref Range   Lactic Acid, Venous 2.6 (HH) 0.5 - 1.9 mmol/L  Blood Culture (routine x 2)     Status: None (Preliminary result)   Collection Time: 12/09/19  4:52 PM   Specimen: BLOOD RIGHT HAND  Result Value Ref Range   Specimen Description BLOOD RIGHT HAND    Special Requests      BOTTLES DRAWN AEROBIC AND ANAEROBIC Blood Culture adequate volume Performed at [facility], [address] [place], [place] [address]    Culture PENDING    Report Status PENDING   Blood Culture (routine x 2)     Status: None (Preliminary result)   Collection Time: 12/09/19  5:00 PM   Specimen: BLOOD LEFT HAND  Result Value Ref Range   Specimen Description BLOOD LEFT HAND    Special Requests      BOTTLES DRAWN AEROBIC AND ANAEROBIC Blood Culture adequate volume Performed at [facility], [address] [place], [place] [address]    Culture PENDING    Report Status PENDING   SARS Coronavirus 2 by RT PCR (hospital order, performed in [facility] hospital lab) Nasopharyngeal Nasopharyngeal Swab     Status: None   Collection Time: 12/09/19  6:10 PM   Specimen: Nasopharyngeal Swab  Result Value Ref  Range   SARS Coronavirus 2 NEGATIVE NEGATIVE  Lactic acid, plasma     Status: None   Collection Time:  12/09/19  6:33 PM  Result Value Ref Range   Lactic Acid, Venous 1.7 0.5 - 1.9 mmol/L    BMET Recent Labs    12/09/19 1616  NA 133*  K 3.5  CL 99  CO2 22  GLUCOSE 137*  BUN 23*  CREATININE 2.06*  CALCIUM 9.2   PT/INR Recent Labs    12/09/19 1616  LABPROT 15.5*  INR 1.3*   ABG No results for input(s): PHART, HCO3 in the last 72 hours.  Invalid input(s): PCO2, PO2  Studies/Results: CT ABDOMEN PELVIS WO CONTRAST  Result Date: 12/09/2019 CLINICAL DATA:  Left abdominal pain, vomiting EXAM: CT ABDOMEN AND PELVIS WITHOUT CONTRAST TECHNIQUE: Multidetector CT imaging of the abdomen and pelvis was performed following the standard protocol without IV contrast. COMPARISON:  2019 FINDINGS: Lower chest: Mild bibasilar atelectasis/scarring. Hepatobiliary: No focal liver lesion.  Gallbladder is contracted. Pancreas: Unremarkable. Spleen: Unremarkable. Adrenals/Urinary Tract: Adrenals are unremarkable. There is left perinephric stranding and left hydronephrosis. Left hydroureter with surrounding inflammatory change. An obstructing 4 mm calculus is present within the distal ureter. Right kidney is unremarkable. The bladder is unremarkable. Stomach/Bowel: Small hiatal hernia. Stomach is otherwise unremarkable. Bowel is normal in caliber. Vascular/Lymphatic: Mild aortic atherosclerosis. Pathologically enlarged lymph nodes. Reproductive: Uterus and bilateral adnexa are unremarkable. Other: Fat containing left inguinal hernia. Musculoskeletal: Degenerative changes of the spine. No acute osseous abnormality IMPRESSION: 4 mm obstructing distal left ureteral calculus with proximal hydroureteronephrosis. Electronically Signed   By: Guadlupe Spanish M.D.   On: 12/09/2019 19:55   DG Chest Port 1 View  Result Date: 12/09/2019 CLINICAL DATA:  Abdominal pain with nausea and vomiting. EXAM: PORTABLE CHEST 1 VIEW COMPARISON:  August 28, 2016 FINDINGS: Mild, diffuse chronic appearing increased lung markings are seen  without evidence of acute infiltrate, pleural effusion or pneumothorax. The heart size and mediastinal contours are within normal limits. Degenerative changes seen within the thoracic spine. IMPRESSION: No active disease. Electronically Signed   By: Aram Candela M.D.   On: 12/09/2019 18:04   Case discussed with Burgess Amor.  I have reviewed the imaging, labs and notes.  Assessment/Plan: Left proximal stone with sepsis.  She will be taken for cystoscopy with left ureteral stenting.  Risks of bleeding, infection, ureteral injury, need for secondary procedures, thrombotic events and anesthetic complications reviewed.           No follow-ups on file.    CC: Burgess Amor PA    Bjorn Pippin 12/10/2019 [phone]

## 2019-12-11 ENCOUNTER — Other Ambulatory Visit: Payer: Self-pay | Admitting: Urology

## 2019-12-11 DIAGNOSIS — R7881 Bacteremia: Secondary | ICD-10-CM

## 2019-12-11 LAB — BASIC METABOLIC PANEL
Anion gap: 9 (ref 5–15)
BUN: 21 mg/dL — ABNORMAL HIGH (ref 6–20)
CO2: 18 mmol/L — ABNORMAL LOW (ref 22–32)
Calcium: 8.9 mg/dL (ref 8.9–10.3)
Chloride: 111 mmol/L (ref 98–111)
Creatinine, Ser: 1 mg/dL (ref 0.44–1.00)
GFR calc Af Amer: >60 mL/min (ref >60)
GFR calc non Af Amer: >60 mL/min (ref >60)
Glucose, Bld: 131 mg/dL — ABNORMAL HIGH (ref 70–99)
Potassium: 3.8 mmol/L (ref 3.5–5.1)
Sodium: 138 mmol/L (ref 135–145)

## 2019-12-11 LAB — CBC
HCT: 32.7 % — ABNORMAL LOW (ref 36.0–46.0)
Hemoglobin: 10.6 g/dL — ABNORMAL LOW (ref 12.0–15.0)
MCH: 31.4 pg (ref 26.0–34.0)
MCHC: 32.4 g/dL (ref 30.0–36.0)
MCV: 96.7 fL (ref 80.0–100.0)
Platelets: 216 10*3/uL (ref 150–400)
RBC: 3.38 MIL/uL — ABNORMAL LOW (ref 3.87–5.11)
RDW: 14 % (ref 11.5–15.5)
WBC: 15.2 10*3/uL — ABNORMAL HIGH (ref 4.0–10.5)
nRBC: 0 % (ref 0.0–0.2)

## 2019-12-11 LAB — URINE CULTURE

## 2019-12-11 MED ORDER — SODIUM CHLORIDE 0.9 % IV SOLN
1.0000 g | Freq: Three times a day (TID) | INTRAVENOUS | Status: DC
  Administered 2019-12-11 – 2019-12-13 (×6): 1 g via INTRAVENOUS
  Filled 2019-12-11 (×6): qty 1

## 2019-12-11 MED ORDER — ALUM & MAG HYDROXIDE-SIMETH 200-200-20 MG/5ML PO SUSP
15.0000 mL | Freq: Four times a day (QID) | ORAL | Status: DC | PRN
  Administered 2019-12-11: 15 mL via ORAL
  Filled 2019-12-11: qty 30

## 2019-12-11 NOTE — Progress Notes (Signed)
Pharmacy Antibiotic Note  Sandra Carroll is a 54 y.o. female admitted on 12/09/2019 with urosepsis with obstruction.  Pharmacy has been consulted for meropenem and vancomycin dosing. BCx growing gram-neg rods, vanc stopped per MD  12/11/2019 Scr 1.0, CrCl ~ 73.1 ml/min  Plan: Increase meropenem 1 g IV q8h F/U C&S, abx deescalation / LOT   Weight: 98.8 kg (217 lb 13 oz)  Temp (24hrs), Avg:98 F (36.7 C), Min:97.9 F (36.6 C), Max:98 F (36.7 C)  Recent Labs  Lab 12/09/19 1616 12/09/19 1651 12/09/19 1833 12/10/19 0744 12/11/19 0552  WBC 19.9*  --   --  14.4* 15.2*  CREATININE 2.06*  --   --  1.66* 1.00  LATICACIDVEN  --  2.6* 1.7  --   --     Estimated Creatinine Clearance: 73.1 mL/min (by C-G formula based on SCr of 1 mg/dL).    No Known Allergies  Antimicrobials this admission: 6/21 Zosyn x 1 6/21 vancomycin >> 6/23 6/21 meropenem >>  Dose adjustments this admission:   Microbiology results: 6/21BCx: GNR 6/21 UCx: multiple species  Thank you for allowing pharmacy to be a part of this patient's care.  Arley Phenix RPh 12/11/2019, 11:20 AM

## 2019-12-11 NOTE — Progress Notes (Signed)
   12/11/19 1254  Assess: MEWS Score  Temp 99.1 F (37.3 C)  BP 127/88  Pulse Rate (!) 105  Resp (!) 22  SpO2 99 %  O2 Device Room Air  Assess: MEWS Score  MEWS Temp 0  MEWS Systolic 0  MEWS Pulse 1  MEWS RR 1  MEWS LOC 0  MEWS Score 2  MEWS Score Color Yellow  Assess: if the MEWS score is Yellow or Red  Were vital signs taken at a resting state? Yes  Focused Assessment Documented focused assessment  Early Detection of Sepsis Score *See Row Information* Medium  MEWS guidelines implemented *See Row Information* Yes  Treat  MEWS Interventions Escalated (See documentation below)  Take Vital Signs  Increase Vital Sign Frequency  Yellow: Q 2hr X 2 then Q 4hr X 2, if remains yellow, continue Q 4hrs  Escalate  MEWS: Escalate Yellow: discuss with charge nurse/RN and consider discussing with provider and RRT  Notify: Charge Nurse/RN  Name of Charge Nurse/RN Notified Launa Flight, RN  Date Charge Nurse/RN Notified 12/11/19  Time Charge Nurse/RN Notified 1300

## 2019-12-11 NOTE — Progress Notes (Addendum)
[facility] PROGRESS NOTE   SUSSIE MINOR TKW:[id number] DOB: 04-04-1966 DOA: 12/09/2019  PCP: Terald Sleeper, PA-C  Brief History/Interval Summary: 54 y.o. year-old w/ hx of HTN, HL and kidney stones presented to ED w/ 1.5 day hx of L sided abdominal pain, nausea and refractory emesis (10x yesterday). She underwent CT scan which showed 4 mm obstructing distal left ureteral calculus. There was hydronephrosis. Urology was consulted. Patient was transferred to [facility].   Reason for Visit: Ureterolithiasis with obstruction  Consultants: Urology  Procedures:  On June 22: 1.  Cystoscopy with left retrograde pyelogram and interpretation. 2.  Cystoscopy with insertion of left double-J stent.   Antibiotics: Anti-infectives (From admission, onward)   Start     Dose/Rate Route Frequency Ordered Stop   12/09/19 2300  vancomycin (VANCOREADY) IVPB 1250 mg/250 mL     Discontinue     1,250 mg 166.7 mL/hr over 90 Minutes Intravenous Every 24 hours 12/09/19 2218     12/09/19 2215  meropenem (MERREM) 1 g in sodium chloride 0.9 % 100 mL IVPB     Discontinue     1 g 200 mL/hr over 30 Minutes Intravenous Every 12 hours 12/09/19 2212     12/09/19 2200  piperacillin-tazobactam (ZOSYN) IVPB 3.375 g  Status:  Discontinued        3.375 g 12.5 mL/hr over 240 Minutes Intravenous Every 8 hours 12/09/19 2135 12/09/19 2202   12/09/19 1645  piperacillin-tazobactam (ZOSYN) IVPB 3.375 g        3.375 g 100 mL/hr over 30 Minutes Intravenous  Once 12/09/19 1642 12/09/19 1823      Subjective/Interval History: Patient continues to have some pain in her left flank.  No nausea vomiting.  Tolerating her diet.  ROS: Denies any shortness of breath.    Assessment/Plan:  Acute pyelonephritis complicated by ureteral stone with obstruction/sepsis present on admission/bacteremia Seen by urology and underwent cystoscopy and stent placement.  Patient was started on broad-spectrum antibiotics  with vancomycin and Meropenem.  Urine culture is showing multiple species.  We will send another sample.  Blood culture however is growing gram-negative rod.  We will stop the vancomycin.  Continue Meropenem for now.  Foley catheter to be discontinued per urology.  Mobilize.  Acute kidney injury Most likely due to dehydration, ureteral stone, along with the use of ACE inhibitor.  Renal function is now back to baseline with IV hydration.  Cut back on IV fluids.  Monitor urine output.  Normocytic anemia Drop in hemoglobin is dilutional. Continue to monitor. No evidence of overt bleeding.  Hemoglobin is stable.  Essential hypertension Patient medications were placed on hold due to low blood pressures at admission.  Continue to monitor for now.    Obesity Estimated body mass index is 39.84 kg/m as calculated from the following:   Height as of 08/28/19: 5\' 2"  (1.575 m).   Weight as of this encounter: 98.8 kg.   DVT Prophylaxis: Initiate Lovenox Code Status: Full code Family Communication: Discussed with the patient Disposition Plan:  Status is: Inpatient  Remains inpatient appropriate because:IV treatments appropriate due to intensity of illness or inability to take PO   Dispo: The patient is from: Home              Anticipated d/c is to: Home              Anticipated d/c date is: 2 days  Patient currently is not medically stable to d/c.     Medications:  Scheduled: . Chlorhexidine Gluconate Cloth  6 each Topical Daily  . citalopram  10 mg Oral Daily  . desogestrel-ethinyl estradiol  1 tablet Oral Daily  . enoxaparin (LOVENOX) injection  40 mg Subcutaneous Daily  . simvastatin  20 mg Oral QPM   Continuous: . sodium chloride Stopped (12/10/19 2243)  . meropenem (MERREM) IV 1 g (12/11/19 0917)  . vancomycin Stopped (12/11/19 0058)   OEV:OJJK & mag hydroxide-simeth, HYDROmorphone (DILAUDID) injection, ondansetron **OR** ondansetron (ZOFRAN) IV, polyethylene glycol,  traMADol   Objective:  Vital Signs  Vitals:   12/10/19 1500 12/10/19 2125 12/11/19 0436 12/11/19 0500  BP: 122/76 (!) 107/91 (!) 141/86   Pulse: 79 78 92   Resp: 18 16 18    Temp: 97.9 F (36.6 C) 98 F (36.7 C) 98 F (36.7 C)   TempSrc: Oral Oral Oral   SpO2: 95% 96% 98%   Weight:    98.8 kg    Intake/Output Summary (Last 24 hours) at 12/11/2019 1107 Last data filed at 12/11/2019 1033 Gross per 24 hour  Intake 2141.22 ml  Output 3200 ml  Net -1058.78 ml   Filed Weights   12/09/19 1450 12/11/19 0500  Weight: 92.2 kg 98.8 kg    General appearance: Awake alert.  In no distress Resp: Clear to auscultation bilaterally.  Normal effort Cardio: S1-S2 is normal regular.  No S3-S4.  No rubs murmurs or bruit GI: Abdomen is soft.  Nontender nondistended.  Bowel sounds are present normal.  No masses organomegaly Extremities: No edema.  Full range of motion of lower extremities. Neurologic: Alert and oriented x3.  No focal neurological deficits.   Lab Results:  Data Reviewed: I have personally reviewed following labs and imaging studies  CBC: Recent Labs  Lab 12/09/19 1616 12/10/19 0744 12/11/19 0552  WBC 19.9* 14.4* 15.2*  NEUTROABS 18.0*  --   --   HGB 12.9 10.5* 10.6*  HCT 39.4 32.2* 32.7*  MCV 96.1 97.0 96.7  PLT 260 197 216    Basic Metabolic Panel: Recent Labs  Lab 12/09/19 1616 12/10/19 0744 12/11/19 0552  NA 133* 135 138  K 3.5 4.0 3.8  CL 99 107 111  CO2 22 18* 18*  GLUCOSE 137* 160* 131*  BUN 23* 25* 21*  CREATININE 2.06* 1.66* 1.00  CALCIUM 9.2 8.2* 8.9    GFR: Estimated Creatinine Clearance: 73.1 mL/min (by C-G formula based on SCr of 1 mg/dL).  Liver Function Tests: Recent Labs  Lab 12/09/19 1616  AST 22  ALT 18  ALKPHOS 59  BILITOT 1.5*  PROT 7.1  ALBUMIN 3.1*    Recent Labs  Lab 12/09/19 1616  LIPASE 15    Coagulation Profile: Recent Labs  Lab 12/09/19 1616  INR 1.3*      Recent Results (from the past 240 hour(s))    Urine culture     Status: Abnormal   Collection Time: 12/09/19  4:44 PM   Specimen: In/Out Cath Urine  Result Value Ref Range Status   Specimen Description   Final    IN/OUT CATH URINE Performed at [facility], [address] [place], [place]    Special Requests   Final    NONE Performed at [facility], [address], [place], [place]    Culture MULTIPLE SPECIES PRESENT, SUGGEST RECOLLECTION (A)  Final   Report Status 12/11/2019 FINAL  Final  Blood Culture (routine x 2)  Status: None (Preliminary result)   Collection Time: 12/09/19  4:52 PM   Specimen: BLOOD RIGHT HAND  Result Value Ref Range Status   Specimen Description BLOOD RIGHT HAND  Final   Special Requests   Final    BOTTLES DRAWN AEROBIC AND ANAEROBIC Blood Culture adequate volume   Culture  Setup Time   Final    GRAM NEGATIVE RODS ANAEROBIC BOTTLE ONLY Gram Stain Report Called to,Read Back By and Verified With: DICKEY A. @ [facility] @ 0816 ON [id number] BY HENDERSON L.    Culture   Final    NO GROWTH 2 DAYS Performed at [facility], [address] [place], [place] [address]    Report Status PENDING  Incomplete  Blood Culture (routine x 2)     Status: None (Preliminary result)   Collection Time: 12/09/19  5:00 PM   Specimen: BLOOD LEFT HAND  Result Value Ref Range Status   Specimen Description BLOOD LEFT HAND  Final   Special Requests   Final    BOTTLES DRAWN AEROBIC AND ANAEROBIC Blood Culture adequate volume   Culture   Final    NO GROWTH 2 DAYS Performed at [facility], [address], [place], [place] [address]    Report Status PENDING  Incomplete  SARS Coronavirus 2 by RT PCR (hospital order, performed in [facility] hospital lab) Nasopharyngeal Nasopharyngeal Swab     Status: None   Collection Time: 12/09/19  6:10 PM   Specimen: Nasopharyngeal Swab  Result Value Ref Range Status   SARS Coronavirus 2 NEGATIVE NEGATIVE Final    Comment: (NOTE) SARS-CoV-2 target nucleic acids are NOT  DETECTED.  The SARS-CoV-2 RNA is generally detectable in upper and lower respiratory specimens during the acute phase of infection. The lowest concentration of SARS-CoV-2 viral copies this assay can detect is 250 copies / mL. A negative result does not preclude SARS-CoV-2 infection and should not be used as the sole basis for treatment or other patient management decisions.  A negative result may occur with improper specimen collection / handling, submission of specimen other than nasopharyngeal swab, presence of viral mutation(s) within the areas targeted by this assay, and inadequate number of viral copies (<250 copies / mL). A negative result must be combined with clinical observations, patient history, and epidemiological information.  Fact Sheet for Patients:   BoilerBrush.com.cy  Fact Sheet for Healthcare Providers: https://pope.com/  This test is not yet approved or  cleared by the [place] FDA and has been authorized for detection and/or diagnosis of SARS-CoV-2 by FDA under an Emergency Use Authorization (EUA).  This EUA will remain in effect (meaning this test can be used) for the duration of the COVID-19 declaration under Section 564(b)(1) of the Act, 21 U.S.C. section 360bbb-3(b)(1), unless the authorization is terminated or revoked sooner.  Performed at [facility], [address], [place], [place] [address]       Radiology Studies: CT ABDOMEN PELVIS WO CONTRAST  Result Date: 12/09/2019 CLINICAL DATA:  Left abdominal pain, vomiting EXAM: CT ABDOMEN AND PELVIS WITHOUT CONTRAST TECHNIQUE: Multidetector CT imaging of the abdomen and pelvis was performed following the standard protocol without IV contrast. COMPARISON:  2019 FINDINGS: Lower chest: Mild bibasilar atelectasis/scarring. Hepatobiliary: No focal liver lesion.  Gallbladder is contracted. Pancreas: Unremarkable. Spleen: Unremarkable. Adrenals/Urinary Tract:  Adrenals are unremarkable. There is left perinephric stranding and left hydronephrosis. Left hydroureter with surrounding inflammatory change. An obstructing 4 mm calculus is present within the distal ureter. Right kidney is unremarkable. The  bladder is unremarkable. Stomach/Bowel: Small hiatal hernia. Stomach is otherwise unremarkable. Bowel is normal in caliber. Vascular/Lymphatic: Mild aortic atherosclerosis. Pathologically enlarged lymph nodes. Reproductive: Uterus and bilateral adnexa are unremarkable. Other: Fat containing left inguinal hernia. Musculoskeletal: Degenerative changes of the spine. No acute osseous abnormality IMPRESSION: 4 mm obstructing distal left ureteral calculus with proximal hydroureteronephrosis. Electronically Signed   By: Guadlupe Spanish M.D.   On: 12/09/2019 19:55   DG Chest Port 1 View  Result Date: 12/09/2019 CLINICAL DATA:  Abdominal pain with nausea and vomiting. EXAM: PORTABLE CHEST 1 VIEW COMPARISON:  August 28, 2016 FINDINGS: Mild, diffuse chronic appearing increased lung markings are seen without evidence of acute infiltrate, pleural effusion or pneumothorax. The heart size and mediastinal contours are within normal limits. Degenerative changes seen within the thoracic spine. IMPRESSION: No active disease. Electronically Signed   By: Aram Candela M.D.   On: 12/09/2019 18:04   DG C-Arm 1-60 Min  Result Date: 12/10/2019 CLINICAL DATA:  Surgery, intraoperative left stent placement. EXAM: DG C-ARM 1-60 MIN CONTRAST:  Not specified. FLUOROSCOPY TIME:  Fluoroscopy Time:  6 seconds Radiation Exposure Index (if provided by the fluoroscopic device): 2.7 mGy Number of Acquired Spot Images: Single spot view as well as cine clip. COMPARISON:  Abdominal CT yesterday. FINDINGS: Cine clip demonstrates contrast opacifying the left ureter. Filling defect in the left ureter at site of calculus on CT. Single spot view demonstrates placement of a ureteric stent with loop in the renal  pelvis. IMPRESSION: Procedural fluoroscopy during left ureteral stent placement. Electronically Signed   By: Narda Rutherford M.D.   On: 12/10/2019 02:15       LOS: 2 days   Sherlock Nancarrow Rito Ehrlich  [facility] Pager on [email or web address]  12/11/2019, 11:07 AM

## 2019-12-11 NOTE — Progress Notes (Signed)
2 Days Post-Op  Subjective: Sandra Carroll is improving s/p left ureteral stenting.  Her Cr has normalized and she is afebrile without significant pain.  She has stent irritation.  She still has a leukocytosis.  Cx are pending.   ROS:  Review of Systems  Constitutional: Negative for fever.  Gastrointestinal: Negative for nausea.  Genitourinary: Positive for frequency and urgency.  All other systems reviewed and are negative.   Anti-infectives: Anti-infectives (From admission, onward)   Start     Dose/Rate Route Frequency Ordered Stop   12/09/19 2300  vancomycin (VANCOREADY) IVPB 1250 mg/250 mL     Discontinue     1,250 mg 166.7 mL/hr over 90 Minutes Intravenous Every 24 hours 12/09/19 2218     12/09/19 2215  meropenem (MERREM) 1 g in sodium chloride 0.9 % 100 mL IVPB     Discontinue     1 g 200 mL/hr over 30 Minutes Intravenous Every 12 hours 12/09/19 2212     12/09/19 2200  piperacillin-tazobactam (ZOSYN) IVPB 3.375 g  Status:  Discontinued        3.375 g 12.5 mL/hr over 240 Minutes Intravenous Every 8 hours 12/09/19 2135 12/09/19 2202   12/09/19 1645  piperacillin-tazobactam (ZOSYN) IVPB 3.375 g        3.375 g 100 mL/hr over 30 Minutes Intravenous  Once 12/09/19 1642 12/09/19 1823      Current Facility-Administered Medications  Medication Dose Route Frequency Provider Last Rate Last Admin  . 0.9 %  sodium chloride infusion   Intravenous Continuous Delano Metz, MD   Stopped at 12/10/19 2243  . alum & mag hydroxide-simeth (MAALOX/MYLANTA) 200-200-20 MG/5ML suspension 15 mL  15 mL Oral Q6H PRN Osvaldo Shipper, MD   15 mL at 12/11/19 0545  . Chlorhexidine Gluconate Cloth 2 % PADS 6 each  6 each Topical Daily Delano Metz, MD   6 each at 12/10/19 [phone]  . citalopram (CELEXA) tablet 10 mg  10 mg Oral Daily Bjorn Pippin, MD   10 mg at 12/10/19 0934  . desogestrel-ethinyl estradiol (MIRCETTE) 0.15-0.02/0.01 MG (21/5) per tablet 1 tablet  1 tablet Oral Daily Bjorn Pippin, MD      .  enoxaparin (LOVENOX) injection 40 mg  40 mg Subcutaneous Daily Osvaldo Shipper, MD   40 mg at 12/10/19 1357  . HYDROmorphone (DILAUDID) injection 0.5-1 mg  0.5-1 mg Intravenous Q2H PRN Delano Metz, MD   1 mg at 12/11/19 0516  . meropenem (MERREM) 1 g in sodium chloride 0.9 % 100 mL IVPB  1 g Intravenous Q12H Royce Macadamia, [facility]   Stopped at 12/10/19 2313  . ondansetron (ZOFRAN) tablet 4 mg  4 mg Oral Q6H PRN Delano Metz, MD       Or  . ondansetron (ZOFRAN) injection 4 mg  4 mg Intravenous Q6H PRN Delano Metz, MD      . polyethylene glycol (MIRALAX / GLYCOLAX) packet 17 g  17 g Oral Daily PRN Delano Metz, MD      . simvastatin (ZOCOR) tablet 20 mg  20 mg Oral QPM Bjorn Pippin, MD   20 mg at 12/10/19 1708  . traMADol (ULTRAM) tablet 50 mg  50 mg Oral Q8H PRN Delano Metz, MD      . vancomycin (VANCOREADY) IVPB 1250 mg/250 mL  1,250 mg Intravenous Q24H Royce Macadamia, [facility]   Stopped at 12/11/19 0058     Objective: Vital signs in last 24 hours: Temp:  [97.9 F (36.6 C)-98 F (36.7 C)] 98 F (36.7  C) (06/23 0436) Pulse Rate:  [78-92] 92 (06/23 0436) Resp:  [16-18] 18 (06/23 0436) BP: (107-141)/(76-91) 141/86 (06/23 0436) SpO2:  [95 %-98 %] 98 % (06/23 0436) Weight:  [98.8 kg] 98.8 kg (06/23 0500)  Intake/Output from previous day: 06/22 0701 - 06/23 0700 In: 2164.1 [I.V.:1455.4; IV Piggyback:708.7] Out: 2200 [Urine:2200] Intake/Output this shift: Total I/O In: 1125.3 [I.V.:472.7; IV Piggyback:652.6] Out: 1000 [Urine:1000]   Physical Exam Vitals reviewed.  Constitutional:      Appearance: She is well-developed.  Neurological:     Mental Status: She is alert.     Lab Results:  Recent Labs    12/10/19 0744 12/11/19 0552  WBC 14.4* 15.2*  HGB 10.5* 10.6*  HCT 32.2* 32.7*  PLT 197 216   BMET Recent Labs    12/10/19 0744 12/11/19 0552  NA 135 138  K 4.0 3.8  CL 107 111  CO2 18* 18*  GLUCOSE 160* 131*  BUN 25* 21*  CREATININE 1.66* 1.00   CALCIUM 8.2* 8.9   PT/INR Recent Labs    12/09/19 1616  LABPROT 15.5*  INR 1.3*   ABG No results for input(s): PHART, HCO3 in the last 72 hours.  Invalid input(s): PCO2, PO2  Studies/Results: CT ABDOMEN PELVIS WO CONTRAST  Result Date: 12/09/2019 CLINICAL DATA:  Left abdominal pain, vomiting EXAM: CT ABDOMEN AND PELVIS WITHOUT CONTRAST TECHNIQUE: Multidetector CT imaging of the abdomen and pelvis was performed following the standard protocol without IV contrast. COMPARISON:  2019 FINDINGS: Lower chest: Mild bibasilar atelectasis/scarring. Hepatobiliary: No focal liver lesion.  Gallbladder is contracted. Pancreas: Unremarkable. Spleen: Unremarkable. Adrenals/Urinary Tract: Adrenals are unremarkable. There is left perinephric stranding and left hydronephrosis. Left hydroureter with surrounding inflammatory change. An obstructing 4 mm calculus is present within the distal ureter. Right kidney is unremarkable. The bladder is unremarkable. Stomach/Bowel: Small hiatal hernia. Stomach is otherwise unremarkable. Bowel is normal in caliber. Vascular/Lymphatic: Mild aortic atherosclerosis. Pathologically enlarged lymph nodes. Reproductive: Uterus and bilateral adnexa are unremarkable. Other: Fat containing left inguinal hernia. Musculoskeletal: Degenerative changes of the spine. No acute osseous abnormality IMPRESSION: 4 mm obstructing distal left ureteral calculus with proximal hydroureteronephrosis. Electronically Signed   By: Macy Mis M.D.   On: 12/09/2019 19:55   DG Chest Port 1 View  Result Date: 12/09/2019 CLINICAL DATA:  Abdominal pain with nausea and vomiting. EXAM: PORTABLE CHEST 1 VIEW COMPARISON:  August 28, 2016 FINDINGS: Mild, diffuse chronic appearing increased lung markings are seen without evidence of acute infiltrate, pleural effusion or pneumothorax. The heart size and mediastinal contours are within normal limits. Degenerative changes seen within the thoracic spine. IMPRESSION:  No active disease. Electronically Signed   By: Virgina Norfolk M.D.   On: 12/09/2019 18:04   DG C-Arm 1-60 Min  Result Date: 12/10/2019 CLINICAL DATA:  Surgery, intraoperative left stent placement. EXAM: DG C-ARM 1-60 MIN CONTRAST:  Not specified. FLUOROSCOPY TIME:  Fluoroscopy Time:  6 seconds Radiation Exposure Index (if provided by the fluoroscopic device): 2.7 mGy Number of Acquired Spot Images: Single spot view as well as cine clip. COMPARISON:  Abdominal CT yesterday. FINDINGS: Cine clip demonstrates contrast opacifying the left ureter. Filling defect in the left ureter at site of calculus on CT. Single spot view demonstrates placement of a ureteric stent with loop in the renal pelvis. IMPRESSION: Procedural fluoroscopy during left ureteral stent placement. Electronically Signed   By: Keith Rake M.D.   On: 12/10/2019 02:15     Assessment and Plan: 39mm left mid ureteral stone  with sepsis improving s/p stent and antibiotics.  I will have the foley d/c'd.  I will get her scheduled next week for ureteroscopic stone removal.   D/C home per hospitalist.       LOS: 2 days    Bjorn Pippin 12/11/2019 [phone] ID: Mindi Slicker, female   DOB: [birth year], 54 y.o.   MRN: [id number]

## 2019-12-12 ENCOUNTER — Encounter (HOSPITAL_COMMUNITY): Payer: Self-pay

## 2019-12-12 LAB — BASIC METABOLIC PANEL
Anion gap: 9 (ref 5–15)
BUN: 12 mg/dL (ref 6–20)
CO2: 18 mmol/L — ABNORMAL LOW (ref 22–32)
Calcium: 9.1 mg/dL (ref 8.9–10.3)
Chloride: 108 mmol/L (ref 98–111)
Creatinine, Ser: 0.8 mg/dL (ref 0.44–1.00)
GFR calc Af Amer: >60 mL/min (ref >60)
GFR calc non Af Amer: >60 mL/min (ref >60)
Glucose, Bld: 92 mg/dL (ref 70–99)
Potassium: 3.5 mmol/L (ref 3.5–5.1)
Sodium: 135 mmol/L (ref 135–145)

## 2019-12-12 LAB — CBC
HCT: 30.1 % — ABNORMAL LOW (ref 36.0–46.0)
Hemoglobin: 10.1 g/dL — ABNORMAL LOW (ref 12.0–15.0)
MCH: 31.2 pg (ref 26.0–34.0)
MCHC: 33.6 g/dL (ref 30.0–36.0)
MCV: 92.9 fL (ref 80.0–100.0)
Platelets: 218 10*3/uL (ref 150–400)
RBC: 3.24 MIL/uL — ABNORMAL LOW (ref 3.87–5.11)
RDW: 13.8 % (ref 11.5–15.5)
WBC: 7.9 10*3/uL (ref 4.0–10.5)
nRBC: 0 % (ref 0.0–0.2)

## 2019-12-12 MED ORDER — POTASSIUM CHLORIDE CRYS ER 20 MEQ PO TBCR
40.0000 meq | EXTENDED_RELEASE_TABLET | Freq: Once | ORAL | Status: AC
  Administered 2019-12-12: 40 meq via ORAL
  Filled 2019-12-12: qty 2

## 2019-12-12 MED ORDER — OXYCODONE HCL 5 MG PO TABS
5.0000 mg | ORAL_TABLET | ORAL | Status: DC | PRN
  Administered 2019-12-12 – 2019-12-13 (×4): 5 mg via ORAL
  Filled 2019-12-12 (×4): qty 1

## 2019-12-12 NOTE — Progress Notes (Signed)
[facility] PROGRESS NOTE   Sandra Carroll RDE:[id number] DOB: Dec 23, 1965 DOA: 12/09/2019  PCP: Remus Loffler, PA-C  Brief History/Interval Summary: 54 y.o. year-old w/ hx of HTN, HL and kidney stones presented to ED w/ 1.5 day hx of L sided abdominal pain, nausea and refractory emesis (10x yesterday). She underwent CT scan which showed 4 mm obstructing distal left ureteral calculus. There was hydronephrosis. Urology was consulted. Patient was transferred to [facility].   Reason for Visit: Ureterolithiasis with obstruction  Consultants: Urology  Procedures:  On June 22: 1.  Cystoscopy with left retrograde pyelogram and interpretation. 2.  Cystoscopy with insertion of left double-J stent.   Antibiotics: Anti-infectives (From admission, onward)   Start     Dose/Rate Route Frequency Ordered Stop   12/11/19 1800  meropenem (MERREM) 1 g in sodium chloride 0.9 % 100 mL IVPB     Discontinue     1 g 200 mL/hr over 30 Minutes Intravenous Every 8 hours 12/11/19 1116     12/09/19 2300  vancomycin (VANCOREADY) IVPB 1250 mg/250 mL  Status:  Discontinued        1,250 mg 166.7 mL/hr over 90 Minutes Intravenous Every 24 hours 12/09/19 2218 12/11/19 1112   12/09/19 2215  meropenem (MERREM) 1 g in sodium chloride 0.9 % 100 mL IVPB  Status:  Discontinued        1 g 200 mL/hr over 30 Minutes Intravenous Every 12 hours 12/09/19 2212 12/11/19 1116   12/09/19 2200  piperacillin-tazobactam (ZOSYN) IVPB 3.375 g  Status:  Discontinued        3.375 g 12.5 mL/hr over 240 Minutes Intravenous Every 8 hours 12/09/19 2135 12/09/19 2202   12/09/19 1645  piperacillin-tazobactam (ZOSYN) IVPB 3.375 g        3.375 g 100 mL/hr over 30 Minutes Intravenous  Once 12/09/19 1642 12/09/19 1823      Subjective/Interval History: Patient very upset that she continues to have kidney stones.  Denies any nausea vomiting.  She is very hesitant to get up and walk.  She was encouraged to do so.  Continues  to have some pain in the left flank area.    ROS: No shortness of breath    Assessment/Plan:  Acute pyelonephritis complicated by ureteral stone with obstruction/sepsis present on admission/bacteremia Seen by urology and underwent cystoscopy and stent placement.  Patient was started on broad-spectrum antibiotics with vancomycin and Meropenem.  Urine culture is showing multiple species.  Another sample was sent.  Blood cultures growing gram-negative rod.  Vancomycin was discontinued.  Continue meropenem.  Foley catheter discontinued by urology.  Patient will need outpatient follow-up with urology for stent removal.  To rent for sometime next week.  Await final identification and sensitivities of the bacteria in the blood.  WBC is normal.  Acute kidney injury Most likely due to dehydration, ureteral stone, along with the use of ACE inhibitor.  Renal function back to baseline.  Monitor urine output.  Stop IV fluids.  Give a dose of potassium.  Normocytic anemia Drop in hemoglobin is dilutional.  No evidence of overt bleeding.  Hemoglobin remained stable.    Essential hypertension Patient medications were placed on hold due to low blood pressures at admission.  Pressures have improved.  Continue to monitor.   Obesity Estimated body mass index is 39.49 kg/m as calculated from the following:   Height as of this encounter: 5\' 2"  (1.575 m).   Weight as of this encounter: 97.9 kg.  DVT Prophylaxis: Initiate Lovenox Code Status: Full code Family Communication: Discussed with the patient Disposition Plan:  Status is: Inpatient  Remains inpatient appropriate because:IV treatments appropriate due to intensity of illness or inability to take PO   Dispo: The patient is from: Home              Anticipated d/c is to: Home              Anticipated d/c date is: 2 days              Patient currently is not medically stable to d/c.     Medications:  Scheduled:  Chlorhexidine Gluconate Cloth   6 each Topical Daily   citalopram  10 mg Oral Daily   desogestrel-ethinyl estradiol  1 tablet Oral Daily   enoxaparin (LOVENOX) injection  40 mg Subcutaneous Daily   simvastatin  20 mg Oral QPM   Continuous:  meropenem (MERREM) IV 1 g (12/12/19 0938)   TDD:UKGU & mag hydroxide-simeth, HYDROmorphone (DILAUDID) injection, ondansetron **OR** ondansetron (ZOFRAN) IV, oxyCODONE, polyethylene glycol   Objective:  Vital Signs  Vitals:   12/11/19 2037 12/12/19 0006 12/12/19 0438 12/12/19 0628  BP: 123/64 130/78 140/85   Pulse: 80 88 88   Resp: 20 (!) 22 20   Temp: 98.4 F (36.9 C) 99.5 F (37.5 C) 99.6 F (37.6 C)   TempSrc: Oral Oral Oral   SpO2: 96% 96% 96%   Weight:    97.9 kg  Height:        Intake/Output Summary (Last 24 hours) at 12/12/2019 0952 Last data filed at 12/12/2019 0600 Gross per 24 hour  Intake 450 ml  Output 950 ml  Net -500 ml   Filed Weights   12/11/19 0500 12/11/19 1632 12/12/19 0628  Weight: 98.8 kg 98.8 kg 97.9 kg    General appearance: Awake alert.  In no distress.  Obese Resp: Clear to auscultation bilaterally.  Normal effort Cardio: S1-S2 is normal regular.  No S3-S4.  No rubs murmurs or bruit GI: Abdomen is soft.  Mildly tender in the left abdomen without any rebound rigidity or guarding.  No masses organomegaly.  Extremities: No edema.  Full range of motion of lower extremities. Neurologic: Alert and oriented x3.  No focal neurological deficits.    Lab Results:  Data Reviewed: I have personally reviewed following labs and imaging studies  CBC: Recent Labs  Lab 12/09/19 1616 12/10/19 0744 12/11/19 0552 12/12/19 0541  WBC 19.9* 14.4* 15.2* 7.9  NEUTROABS 18.0*  --   --   --   HGB 12.9 10.5* 10.6* 10.1*  HCT 39.4 32.2* 32.7* 30.1*  MCV 96.1 97.0 96.7 92.9  PLT 260 197 216 218    Basic Metabolic Panel: Recent Labs  Lab 12/09/19 1616 12/10/19 0744 12/11/19 0552 12/12/19 0541  NA 133* 135 138 135  K 3.5 4.0 3.8 3.5  CL  99 107 111 108  CO2 22 18* 18* 18*  GLUCOSE 137* 160* 131* 92  BUN 23* 25* 21* 12  CREATININE 2.06* 1.66* 1.00 0.80  CALCIUM 9.2 8.2* 8.9 9.1    GFR: Estimated Creatinine Clearance: 90.9 mL/min (by C-G formula based on SCr of 0.8 mg/dL).  Liver Function Tests: Recent Labs  Lab 12/09/19 1616  AST 22  ALT 18  ALKPHOS 59  BILITOT 1.5*  PROT 7.1  ALBUMIN 3.1*    Recent Labs  Lab 12/09/19 1616  LIPASE 15    Coagulation Profile: Recent Labs  Lab 12/09/19 1616  INR 1.3*      Recent Results (from the past 240 hour(s))  Urine culture     Status: Abnormal   Collection Time: 12/09/19  4:44 PM   Specimen: In/Out Cath Urine  Result Value Ref Range Status   Specimen Description   Final    IN/OUT CATH URINE Performed at [facility], [address] [place], [place] [address]    Special Requests   Final    NONE Performed at [facility], [address] [place], [place] [address]    Culture MULTIPLE SPECIES PRESENT, SUGGEST RECOLLECTION (A)  Final   Report Status 12/11/2019 FINAL  Final  Blood Culture (routine x 2)     Status: None (Preliminary result)   Collection Time: 12/09/19  4:52 PM   Specimen: BLOOD RIGHT HAND  Result Value Ref Range Status   Specimen Description   Final    BLOOD RIGHT HAND Performed at [facility], [address], [place], [place] [address]    Special Requests   Final    BOTTLES DRAWN AEROBIC AND ANAEROBIC Blood Culture adequate volume Performed at [facility], [address] [place], [place] [address]    Culture  Setup Time   Final    GRAM NEGATIVE RODS ANAEROBIC BOTTLE ONLY Gram Stain Report Called to,Read Back By and Verified With: DICKEY A. @ [facility] @ 0816 ON [id number] BY HENDERSON L. Performed at [facility], [address], [place], [place] [address]    Culture [place]  Final   Report Status PENDING  Incomplete  Blood Culture (routine x 2)     Status: None (Preliminary result)   Collection Time: 12/09/19  5:00 PM   Specimen:  BLOOD LEFT HAND  Result Value Ref Range Status   Specimen Description BLOOD LEFT HAND  Final   Special Requests   Final    BOTTLES DRAWN AEROBIC AND ANAEROBIC Blood Culture adequate volume   Culture   Final    NO GROWTH 3 DAYS Performed at [facility], [address], [place], [place] [address]    Report Status PENDING  Incomplete  SARS Coronavirus 2 by RT PCR (hospital order, performed in [facility] hospital lab) Nasopharyngeal Nasopharyngeal Swab     Status: None   Collection Time: 12/09/19  6:10 PM   Specimen: Nasopharyngeal Swab  Result Value Ref Range Status   SARS Coronavirus 2 NEGATIVE NEGATIVE Final    Comment: (NOTE) SARS-CoV-2 target nucleic acids are NOT DETECTED.  The SARS-CoV-2 RNA is generally detectable in upper and lower respiratory specimens during the acute phase of infection. The lowest concentration of SARS-CoV-2 viral copies this assay can detect is 250 copies / mL. A negative result does not preclude SARS-CoV-2 infection and should not be used as the sole basis for treatment or other patient management decisions.  A negative result may occur with improper specimen collection / handling, submission of specimen other than nasopharyngeal swab, presence of viral mutation(s) within the areas targeted by this assay, and inadequate number of viral copies (<250 copies / mL). A negative result must be combined with clinical observations, patient history, and epidemiological information.  Fact Sheet for Patients:   StrictlyIdeas.no  Fact Sheet for Healthcare Providers: BankingDealers.co.za  This test is not yet approved or  cleared by the [place] FDA and has been authorized for detection and/or diagnosis of SARS-CoV-2 by FDA under an Emergency Use Authorization (EUA).  This EUA will remain in effect (meaning this test can be used) for the  duration of the COVID-19 declaration under Section 564(b)(1) of the Act, 21  U.S.C. section 360bbb-3(b)(1), unless the authorization is terminated or revoked sooner.  Performed at [facility], [address] [place], [place] [address]       Radiology Studies: No results found.     LOS: 3 days   Rafia Shedden [employer] on [email or web address]  12/12/2019, 9:52 AM

## 2019-12-12 NOTE — Plan of Care (Signed)

## 2019-12-12 NOTE — Patient Instructions (Signed)
DUE TO COVID-19 ONLY ONE VISITOR IS ALLOWED TO COME WITH YOU AND STAY IN THE WAITING ROOM ONLY DURING PRE OP AND PROCEDURE DAY OF SURGERY. TWo VISITOR MAY VISIT WITH YOU AFTER SURGERY IN YOUR PRIVATE ROOM DURING VISITING HOURS ONLY!  10-a -8p  YOU NEED TO HAVE A COVID 19 TEST ON__6-25-21_____ @_______ , THIS TEST MUST BE DONE BEFORE SURGERY, COME  [address], [place] , .  [facility]) ONCE YOUR COVID TEST IS COMPLETED, PLEASE BEGIN THE QUARANTINE INSTRUCTIONS AS OUTLINED IN YOUR HANDOUT.                Sandra Carroll  12/12/2019   Your procedure is scheduled on: 12-17-19   Report to [facility] Main  Entrance   Report to admitting at      1247  PM     Call this number if you have problems the morning of surgery [phone]    Remember: Do not eat food :After Midnight. You may have clear liquids until 1140 am then nothing by mouth  BRUSH YOUR TEETH MORNING OF SURGERY AND RINSE YOUR MOUTH OUT, NO CHEWING GUM CANDY OR MINTS.     Take these medicines the morning of surgery with A SIP OF WATER: zocor cslexa                                 You may not have any metal on your body including hair pins and              piercings  Do not wear jewelry, make-up, lotions, powders or perfumes, deodorant             Do not wear nail polish on your fingernails.  Do not shave  48 hours prior to surgery.           Do not bring valuables to the hospital. [facility] IS NOT             RESPONSIBLE   FOR VALUABLES.  Contacts, dentures or bridgework may not be worn into surgery.      Patients discharged the day of surgery will not be allowed to drive home. IF YOU ARE HAVING SURGERY AND GOING HOME THE SAME DAY, YOU MUST HAVE AN ADULT TO DRIVE YOU HOME AND BE WITH YOU FOR 24 HOURS. YOU MAY GO HOME BY TAXI OR UBER OR ORTHERWISE, BUT AN ADULT MUST ACCOMPANY YOU HOME AND STAY WITH YOU FOR 24 HOURS.  Name and phone number of your driver:  Special Instructions:  N/A              Please read over the following fact sheets you were given: _____________________________________________________________________             [facility] - Preparing for Surgery Before surgery, you can play an important role.  Because skin is not sterile, your skin needs to be as free of germs as possible.  You can reduce the number of germs on your skin by washing with CHG (chlorahexidine gluconate) soap before surgery.  CHG is an antiseptic cleaner which kills germs and bonds with the skin to continue killing germs even after washing. Please DO NOT use if you have an allergy to CHG or antibacterial soaps.  If your skin becomes reddened/irritated stop using the CHG and inform your nurse when you arrive at Short Stay. Do not shave (including legs and underarms)  for at least 48 hours prior to the first CHG shower.  You may shave your face/neck. Please follow these instructions carefully:  1.  Shower with CHG Soap the night before surgery and the  morning of Surgery.  2.  If you choose to wash your hair, wash your hair first as usual with your  normal  shampoo.  3.  After you shampoo, rinse your hair and body thoroughly to remove the  shampoo.                           4.  Use CHG as you would any other liquid soap.  You can apply chg directly  to the skin and wash                       Gently with a scrungie or clean washcloth.  5.  Apply the CHG Soap to your body ONLY FROM THE NECK DOWN.   Do not use on face/ open                           Wound or open sores. Avoid contact with eyes, ears mouth and genitals (private parts).                       Wash face,  Genitals (private parts) with your normal soap.             6.  Wash thoroughly, paying special attention to the area where your surgery  will be performed.  7.  Thoroughly rinse your body with warm water from the neck down.  8.  DO NOT shower/wash with your normal soap after using and rinsing off  the CHG Soap.                 9.  Pat yourself dry with a clean towel.            10.  Wear clean pajamas.            11.  Place clean sheets on your bed the night of your first shower and do not  sleep with pets. Day of Surgery : Do not apply any lotions/deodorants the morning of surgery.  Please wear clean clothes to the hospital/surgery center.  FAILURE TO FOLLOW THESE INSTRUCTIONS MAY RESULT IN THE CANCELLATION OF YOUR SURGERY PATIENT SIGNATURE_________________________________  NURSE SIGNATURE__________________________________  ________________________________________________________________________

## 2019-12-13 ENCOUNTER — Encounter (HOSPITAL_COMMUNITY)
Admit: 2019-12-13 | Discharge: 2019-12-13 | Disposition: A | Discharge status: Home / Self Care | Payer: Managed Care, Other (non HMO) | Source: Ambulatory Visit | Attending: Urology | Admitting: Urology

## 2019-12-13 ENCOUNTER — Encounter (HOSPITAL_COMMUNITY): Payer: Self-pay

## 2019-12-13 ENCOUNTER — Other Ambulatory Visit (HOSPITAL_COMMUNITY): Payer: Managed Care, Other (non HMO)

## 2019-12-13 ENCOUNTER — Other Ambulatory Visit: Payer: Self-pay

## 2019-12-13 ENCOUNTER — Encounter (HOSPITAL_COMMUNITY): Admit: 2019-12-13 | Payer: Managed Care, Other (non HMO)

## 2019-12-13 DIAGNOSIS — Z01818 Encounter for other preprocedural examination: Secondary | ICD-10-CM | POA: Diagnosis not present

## 2019-12-13 HISTORY — DX: Personal history of urinary calculi: Z87.442

## 2019-12-13 HISTORY — DX: Depression, unspecified: F32.A

## 2019-12-13 HISTORY — DX: Headache, unspecified: R51.9

## 2019-12-13 HISTORY — DX: Anxiety disorder, unspecified: F41.9

## 2019-12-13 LAB — BASIC METABOLIC PANEL
Anion gap: 9 (ref 5–15)
BUN: 12 mg/dL (ref 6–20)
CO2: 26 mmol/L (ref 22–32)
Calcium: 9.7 mg/dL (ref 8.9–10.3)
Chloride: 104 mmol/L (ref 98–111)
Creatinine, Ser: 0.77 mg/dL (ref 0.44–1.00)
GFR calc Af Amer: >60 mL/min (ref >60)
GFR calc non Af Amer: >60 mL/min (ref >60)
Glucose, Bld: 96 mg/dL (ref 70–99)
Potassium: 3.9 mmol/L (ref 3.5–5.1)
Sodium: 139 mmol/L (ref 135–145)

## 2019-12-13 LAB — URINE CULTURE: Culture: NO GROWTH

## 2019-12-13 LAB — CULTURE, BLOOD (ROUTINE X 2): Special Requests: ADEQUATE

## 2019-12-13 MED ORDER — CEPHALEXIN 500 MG PO CAPS
500.0000 mg | ORAL_CAPSULE | Freq: Four times a day (QID) | ORAL | Status: DC
  Administered 2019-12-13: 500 mg via ORAL
  Filled 2019-12-13: qty 1

## 2019-12-13 MED ORDER — CEPHALEXIN 500 MG PO CAPS: 500.0000 mg | ORAL_CAPSULE | Freq: Four times a day (QID) | ORAL | 0 refills | Status: AC

## 2019-12-13 MED ORDER — OXYCODONE HCL 5 MG PO TABS: 5.0000 mg | ORAL_TABLET | Freq: Four times a day (QID) | ORAL | 0 refills | Status: DC | PRN

## 2019-12-13 MED ORDER — BUTALBITAL-APAP-CAFFEINE 50-325-40 MG PO TABS: 1.0000 | ORAL_TABLET | Freq: Four times a day (QID) | ORAL | 0 refills | Status: DC | PRN

## 2019-12-13 MED ORDER — CEPHALEXIN 500 MG PO CAPS: 500.0000 mg | ORAL_CAPSULE | Freq: Four times a day (QID) | ORAL | Status: DC

## 2019-12-13 MED ORDER — BUTALBITAL-APAP-CAFFEINE 50-325-40 MG PO TABS
2.0000 | ORAL_TABLET | Freq: Four times a day (QID) | ORAL | Status: DC | PRN
  Administered 2019-12-13: 2 via ORAL
  Filled 2019-12-13: qty 2

## 2019-12-13 NOTE — Progress Notes (Signed)
PCP - Chauncey Cruel in [facility] Cardiologist -   PPM/ICD -  Device Orders -  Rep Notified -   Cbc 12-13-19 epic, bmp 12-12-19 epic Chest x-ray - 12-09-19 epic EKG - 12-09-19 epic Stress Test -  ECHO -  Cardiac Cath -   Sleep Study -  CPAP -   Fasting Blood Sugar -  Checks Blood Sugar _____ times a day  Blood Thinner Instructions: Aspirin Instructions:  ERAS Protcol - PRE-SURGERY Ensure or G2-   COVID TEST-    Anesthesia review: In hospital 12-13-19 for kidney stone @ [facility] rescheduled covid test for 12-14-19 if pt. D/c otherwise needs to be done DOS. Pt. Works at nursing home  Patient denies shortness of breath, fever, cough and chest pain at PAT appointment  none   All instructions explained to the patient, with a verbal understanding of the material. Patient agrees to go over the instructions while at home for a better understanding. Patient also instructed to self quarantine after being tested for COVID-19. The opportunity to ask questions was provided.

## 2019-12-13 NOTE — Discharge Summary (Signed)
[facility]  Physician Discharge Summary   Patient ID: Sandra Carroll MRN: [id number] DOB/AGE: 1966/02/16 54 y.o.  Admit date: 12/09/2019 Discharge date: 12/13/2019  PCP: Remus Loffler, PA-C  DISCHARGE DIAGNOSES:  Acute pyelonephritis, complicated Obstructing ureteral stone Bacteremia with E. Coli Acute kidney injury, resolved Normocytic anemia Initial hypertension Obesity   RECOMMENDATIONS FOR OUTPATIENT FOLLOW UP: 1. Follow-up with urology next week for stent removal    Home Health: None Equipment/Devices: None  CODE STATUS: full Code  DISCHARGE CONDITION: fair  Diet recommendation: As before  INITIAL HISTORY: 54 y.o.year-old w/ hx of HTN, HL and kidney stones presented to ED w/ 1.5 day hx of L sided abdominal pain, nausea and refractory emesis (10x yesterday). She underwent CT scan which showed 4 mm obstructing distal left ureteral calculus. There was hydronephrosis. Urology was consulted. Patient was transferred to [facility].   Consultants: Urology  Procedures:  On June 22: 1. Cystoscopy with left retrograde pyelogram and interpretation. 2. Cystoscopy with insertion of left double-J stent.   HOSPITAL COURSE:   Acute pyelonephritis complicated by ureteral stone with obstruction/sepsis present on admission/bacteremia Seen by urology and underwent cystoscopy and stent placement.  Patient was started on broad-spectrum antibiotics with vancomycin and Meropenem.  Urine culture is showing multiple species.  Another sample was sent did not show any growth.  Blood culture positive for E. coli.  Sensitivities reviewed.  Patient discharged on Keflex. Patient to follow-up with urology next week for stent removal.  Acute kidney injury Most likely due to dehydration, ureteral stone, along with the use of ACE inhibitor.  Renal function back to baseline.    Normocytic anemia Drop in hemoglobin is dilutional.  No evidence of overt bleeding.   Hemoglobin remained stable.    Essential hypertension Patient medications were placed on hold due to low blood pressures at admission.  Pressures have improved.  May resume home medications.  Headache, tension versus migraine Improved with Fioricet.  Obesity Estimated body mass index is 39.49 kg/m as calculated from the following:   Height as of this encounter: 5\' 2"  (1.575 m).   Weight as of this encounter: 97.9 kg.  Overall stable.  Okay for discharge home today.   PERTINENT LABS:  The results of significant diagnostics from this hospitalization (including imaging, microbiology, ancillary and laboratory) are listed below for reference.    Microbiology: Recent Results (from the past 240 hour(s))  Urine culture     Status: Abnormal   Collection Time: 12/09/19  4:44 PM   Specimen: In/Out Cath Urine  Result Value Ref Range Status   Specimen Description   Final    IN/OUT CATH URINE Performed at [facility], [address], [place], [place]    Special Requests   Final    NONE Performed at [facility], [address] [place], [place]    Culture MULTIPLE SPECIES PRESENT, SUGGEST RECOLLECTION (A)  Final   Report Status 12/11/2019 FINAL  Final  Blood Culture (routine x 2)     Status: Abnormal   Collection Time: 12/09/19  4:52 PM   Specimen: BLOOD RIGHT HAND  Result Value Ref Range Status   Specimen Description   Final    BLOOD RIGHT HAND Performed at [facility], [address], [place], [place]    Special Requests   Final    BOTTLES DRAWN AEROBIC AND ANAEROBIC Blood Culture adequate volume Performed at [facility], [address], [place], [place]    Culture  Setup Time   Final    GRAM NEGATIVE RODS ANAEROBIC BOTTLE ONLY Gram Stain Report Called to,Read Back By and Verified With: DICKEY A. @ [facility] @ 0816 ON [id number] BY HENDERSON L. Performed at [facility], [address] [place], [place] [address]    Culture ESCHERICHIA COLI (A)   Final   Report Status 12/13/2019 FINAL  Final   Organism ID, Bacteria ESCHERICHIA COLI  Final      Susceptibility   Escherichia coli - MIC*    AMPICILLIN 8 SENSITIVE Sensitive     CEFAZOLIN <=4 SENSITIVE Sensitive     CEFEPIME <=0.12 SENSITIVE Sensitive     CEFTAZIDIME <=1 SENSITIVE Sensitive     CEFTRIAXONE <=0.25 SENSITIVE Sensitive     CIPROFLOXACIN <=0.25 SENSITIVE Sensitive     GENTAMICIN <=1 SENSITIVE Sensitive     IMIPENEM <=0.25 SENSITIVE Sensitive     TRIMETH/SULFA <=20 SENSITIVE Sensitive     AMPICILLIN/SULBACTAM <=2 SENSITIVE Sensitive     PIP/TAZO <=4 SENSITIVE Sensitive     * ESCHERICHIA COLI  Blood Culture (routine x 2)     Status: None (Preliminary result)   Collection Time: 12/09/19  5:00 PM   Specimen: BLOOD LEFT HAND  Result Value Ref Range Status   Specimen Description BLOOD LEFT HAND  Final   Special Requests   Final    BOTTLES DRAWN AEROBIC AND ANAEROBIC Blood Culture adequate volume   Culture   Final    NO GROWTH 4 DAYS Performed at [facility], [address] [place], [place] [address]    Report Status PENDING  Incomplete  SARS Coronavirus 2 by RT PCR (hospital order, performed in [facility] hospital lab) Nasopharyngeal Nasopharyngeal Swab     Status: None   Collection Time: 12/09/19  6:10 PM   Specimen: Nasopharyngeal Swab  Result Value Ref Range Status   SARS Coronavirus 2 NEGATIVE NEGATIVE Final    Comment: (NOTE) SARS-CoV-2 target nucleic acids are NOT DETECTED.  The SARS-CoV-2 RNA is generally detectable in upper and lower respiratory specimens during the acute phase of infection. The lowest concentration of SARS-CoV-2 viral copies this assay can detect is 250 copies / mL. A negative result does not preclude SARS-CoV-2 infection and should not be used as the sole basis for treatment or other patient management decisions.  A negative result may occur with improper specimen collection / handling, submission of specimen other than nasopharyngeal  swab, presence of viral mutation(s) within the areas targeted by this assay, and inadequate number of viral copies (<250 copies / mL). A negative result must be combined with clinical observations, patient history, and epidemiological information.  Fact Sheet for Patients:   BoilerBrush.com.cy  Fact Sheet for Healthcare Providers: https://pope.com/  This test is not yet approved or  cleared by the [place] FDA and has been authorized for detection and/or diagnosis of SARS-CoV-2 by FDA under an Emergency Use Authorization (EUA).  This EUA will remain in effect (meaning this test can be used) for the duration of the COVID-19 declaration under Section 564(b)(1) of the Act, 21 U.S.C. section 360bbb-3(b)(1), unless the authorization is terminated or revoked sooner.  Performed at [facility], [address] [place], [place] [address]   Culture, Urine     Status: None   Collection Time: 12/11/19  6:49 PM   Specimen: Urine, Random  Result Value Ref Range Status   Specimen Description   Final    URINE, RANDOM Performed at [facility], [address] [place],  [place] [address]    Special Requests   Final    NONE Performed at [facility], [address], [place], [place] [address]    Culture   Final    NO GROWTH Performed at [facility], [address] [place], [place] [address]    Report Status 12/13/2019 FINAL  Final     Labs:    Basic Metabolic Panel: Recent Labs  Lab 12/09/19 1616 12/10/19 0744 12/11/19 0552 12/12/19 0541 12/13/19 0502  NA 133* 135 138 135 139  K 3.5 4.0 3.8 3.5 3.9  CL 99 107 111 108 104  CO2 22 18* 18* 18* 26  GLUCOSE 137* 160* 131* 92 96  BUN 23* 25* 21* 12 12  CREATININE 2.06* 1.66* 1.00 0.80 0.77  CALCIUM 9.2 8.2* 8.9 9.1 9.7   Liver Function Tests: Recent Labs  Lab 12/09/19 1616  AST 22  ALT 18  ALKPHOS 59  BILITOT 1.5*  PROT 7.1    ALBUMIN 3.1*   Recent Labs  Lab 12/09/19 1616  LIPASE 15   CBC: Recent Labs  Lab 12/09/19 1616 12/10/19 0744 12/11/19 0552 12/12/19 0541  WBC 19.9* 14.4* 15.2* 7.9  NEUTROABS 18.0*  --   --   --   HGB 12.9 10.5* 10.6* 10.1*  HCT 39.4 32.2* 32.7* 30.1*  MCV 96.1 97.0 96.7 92.9  PLT 260 197 216 218    IMAGING STUDIES CT ABDOMEN PELVIS WO CONTRAST  Result Date: 12/09/2019 CLINICAL DATA:  Left abdominal pain, vomiting EXAM: CT ABDOMEN AND PELVIS WITHOUT CONTRAST TECHNIQUE: Multidetector CT imaging of the abdomen and pelvis was performed following the standard protocol without IV contrast. COMPARISON:  2019 FINDINGS: Lower chest: Mild bibasilar atelectasis/scarring. Hepatobiliary: No focal liver lesion.  Gallbladder is contracted. Pancreas: Unremarkable. Spleen: Unremarkable. Adrenals/Urinary Tract: Adrenals are unremarkable. There is left perinephric stranding and left hydronephrosis. Left hydroureter with surrounding inflammatory change. An obstructing 4 mm calculus is present within the distal ureter. Right kidney is unremarkable. The bladder is unremarkable. Stomach/Bowel: Small hiatal hernia. Stomach is otherwise unremarkable. Bowel is normal in caliber. Vascular/Lymphatic: Mild aortic atherosclerosis. Pathologically enlarged lymph nodes. Reproductive: Uterus and bilateral adnexa are unremarkable. Other: Fat containing left inguinal hernia. Musculoskeletal: Degenerative changes of the spine. No acute osseous abnormality IMPRESSION: 4 mm obstructing distal left ureteral calculus with proximal hydroureteronephrosis. Electronically Signed   By: Guadlupe Spanish M.D.   On: 12/09/2019 19:55   DG Chest Port 1 View  Result Date: 12/09/2019 CLINICAL DATA:  Abdominal pain with nausea and vomiting. EXAM: PORTABLE CHEST 1 VIEW COMPARISON:  August 28, 2016 FINDINGS: Mild, diffuse chronic appearing increased lung markings are seen without evidence of acute infiltrate, pleural effusion or pneumothorax.  The heart size and mediastinal contours are within normal limits. Degenerative changes seen within the thoracic spine. IMPRESSION: No active disease. Electronically Signed   By: Aram Candela M.D.   On: 12/09/2019 18:04   DG C-Arm 1-60 Min  Result Date: 12/10/2019 CLINICAL DATA:  Surgery, intraoperative left stent placement. EXAM: DG C-ARM 1-60 MIN CONTRAST:  Not specified. FLUOROSCOPY TIME:  Fluoroscopy Time:  6 seconds Radiation Exposure Index (if provided by the fluoroscopic device): 2.7 mGy Number of Acquired Spot Images: Single spot view as well as cine clip. COMPARISON:  Abdominal CT yesterday. FINDINGS: Cine clip demonstrates contrast opacifying the left ureter. Filling defect in the left ureter at site of calculus on CT. Single spot view demonstrates placement of a ureteric stent with loop in the renal  pelvis. IMPRESSION: Procedural fluoroscopy during left ureteral stent placement. Electronically Signed   By: Keith Rake M.D.   On: 12/10/2019 02:15    DISCHARGE EXAMINATION: Vitals:   12/12/19 1234 12/12/19 2018 12/13/19 0451 12/13/19 0500  BP: 132/87 129/75 (!) 146/96   Pulse: 86 92 80   Resp: (!) 23 16 18    Temp: 98.4 F (36.9 C) 99.6 F (37.6 C) 98.8 F (37.1 C)   TempSrc: Oral Oral Oral   SpO2: 100% 94% 91%   Weight:    92.6 kg  Height:       General appearance: Awake alert.  In no distress Resp: Clear to auscultation bilaterally.  Normal effort Cardio: S1-S2 is normal regular.  No S3-S4.  No rubs murmurs or bruit GI: Abdomen is soft.  Nontender nondistended.  Bowel sounds are present normal.  No masses organomegaly    DISPOSITION: Home  Discharge Instructions    Discharge instructions   Complete by: As directed    Please take your medications as prescribed. Please be sure to follow up with the urologist, call their office to schedule an appointment for stent removal.  You were cared for by a hospitalist during your hospital stay. If you have any questions  about your discharge medications or the care you received while you were in the hospital after you are discharged, you can call the unit and asked to speak with the hospitalist on call if the hospitalist that took care of you is not available. Once you are discharged, your primary care physician will handle any further medical issues. Please note that NO REFILLS for any discharge medications will be authorized once you are discharged, as it is imperative that you return to your primary care physician (or establish a relationship with a primary care physician if you do not have one) for your aftercare needs so that they can reassess your need for medications and monitor your lab values. If you do not have a primary care physician, you can call [phone] for a physician referral.   Increase activity slowly   Complete by: As directed    No wound care   Complete by: As directed          Allergies as of 12/13/2019   No Known Allergies     Medication List    TAKE these medications   butalbital-acetaminophen-caffeine 50-325-40 MG tablet Commonly known as: FIORICET Take 1-2 tablets by mouth every 6 (six) hours as needed for headache.   cephALEXin 500 MG capsule Commonly known as: KEFLEX Take 1 capsule (500 mg total) by mouth 4 (four) times daily for 10 days. Notes to patient: Tonight (last taken at 1145am)   citalopram 20 MG tablet Commonly known as: CELEXA Take 0.5 tablets (10 mg total) by mouth daily.   desogestrel-ethinyl estradiol 0.15-0.02/0.01 MG (21/5) tablet Commonly known as: [place] TAKE ONE (1) TABLET EACH DAY (Needs physical) What changed:   how much to take  how to take this  when to take this  additional instructions   lisinopril 20 MG tablet Commonly known as: ZESTRIL TAKE ONE (1) TABLET EACH DAY   oxyCODONE 5 MG immediate release tablet Commonly known as: Oxy IR/ROXICODONE Take 1 tablet (5 mg total) by mouth every 6 (six) hours as needed for severe pain.     simvastatin 20 MG tablet Commonly known as: ZOCOR Take 1 tablet (20 mg total) by mouth every evening. What changed: when to take this  Follow-up Information    [facility].   Why: The office will call to arrange your next procedure.  Contact information: [address] [place] [id number] [phone]       Remus LofflerJones, Angel S, PA-C. Schedule an appointment as soon as possible for a visit in 3 week(s).   Specialties: Physician Assistant, Family Medicine Contact information: [address] [place] [id number] [phone]               TOTAL DISCHARGE TIME: 35 minutes  Vanya Carberry Rito EhrlichKrishnan  [facility] Pager on [email or web address]  12/13/2019, 3:34 PM

## 2019-12-14 ENCOUNTER — Other Ambulatory Visit (HOSPITAL_COMMUNITY)
Admit: 2019-12-14 | Discharge: 2019-12-14 | Disposition: A | Discharge status: Home / Self Care | Payer: Managed Care, Other (non HMO) | Attending: Urology | Admitting: Urology

## 2019-12-14 DIAGNOSIS — Z20822 Contact with and (suspected) exposure to covid-19: Secondary | ICD-10-CM | POA: Diagnosis not present

## 2019-12-14 DIAGNOSIS — Z01812 Encounter for preprocedural laboratory examination: Secondary | ICD-10-CM | POA: Diagnosis not present

## 2019-12-14 LAB — CULTURE, BLOOD (ROUTINE X 2)
Culture: NO GROWTH
Special Requests: ADEQUATE

## 2019-12-14 LAB — SARS CORONAVIRUS 2 (TAT 6-24 HRS): SARS Coronavirus 2: NEGATIVE

## 2019-12-16 NOTE — Progress Notes (Signed)
Reviewed with patient that she is to arrive at 1245 on 12/17/19.  Surgery is at 247pm-347pm.  NO solid foods after 12 midnite tonite but may have clear liquids from until 1130am morning of surgery and reviewed clear liquid diet with patient.  Patient voiced understanding.

## 2019-12-16 NOTE — Anesthesia Preprocedure Evaluation (Addendum)
Anesthesia Evaluation  Patient identified by MRN, date of birth, ID band Patient awake    Reviewed: Allergy & Precautions, NPO status , Patient's Chart, lab work & pertinent test results  History of Anesthesia Complications (+) PONV and history of anesthetic complications  Airway Mallampati: II       Dental no notable dental hx. (+) Teeth Intact   Pulmonary former smoker,    Pulmonary exam normal breath sounds clear to auscultation       Cardiovascular hypertension, Pt. on medications Normal cardiovascular exam Rhythm:Regular Rate:Normal     Neuro/Psych  Headaches, PSYCHIATRIC DISORDERS Anxiety Depression  Neuromuscular disease    GI/Hepatic negative GI ROS, Neg liver ROS,   Endo/Other  Morbid obesity  Renal/GU Lab Results      Component                Value               Date                      CREATININE               0.77                12/13/2019                BUN                      12                  12/13/2019                NA                       139                 12/13/2019                K                        3.9                 12/13/2019                CL                       104                 12/13/2019                CO2                      26                  12/13/2019             negative genitourinary   Musculoskeletal   Abdominal (+) + obese,   Peds  Hematology  (+) Blood dyscrasia, anemia , Lab Results      Component                Value               Date                      WBC  7.9                 12/12/2019                HGB                      10.1 (L)            12/12/2019                HCT                      30.1 (L)            12/12/2019                MCV                      92.9                12/12/2019                PLT                      218                 12/12/2019              Anesthesia Other Findings    Reproductive/Obstetrics                            Anesthesia Physical Anesthesia Plan  ASA: III  Anesthesia Plan: General   Post-op Pain Management:    Induction: Intravenous  PONV Risk Score and Plan: 4 or greater and Treatment may vary due to age or medical condition, Ondansetron and Dexamethasone  Airway Management Planned: LMA  Additional Equipment: None  Intra-op Plan:   Post-operative Plan:   Informed Consent: I have reviewed the patients History and Physical, chart, labs and discussed the procedure including the risks, benefits and alternatives for the proposed anesthesia with the patient or authorized representative who has indicated his/her understanding and acceptance.     Dental advisory given  Plan Discussed with: CRNA  Anesthesia Plan Comments:        Anesthesia Quick Evaluation

## 2019-12-17 ENCOUNTER — Ambulatory Visit (HOSPITAL_COMMUNITY): Payer: Managed Care, Other (non HMO) | Admitting: Anesthesiology

## 2019-12-17 ENCOUNTER — Encounter (HOSPITAL_COMMUNITY): Payer: Self-pay | Admitting: Urology

## 2019-12-17 ENCOUNTER — Ambulatory Visit (HOSPITAL_COMMUNITY): Payer: Managed Care, Other (non HMO) | Admitting: Physician Assistant

## 2019-12-17 ENCOUNTER — Encounter (HOSPITAL_COMMUNITY)
Admission: RE | Disposition: A | Discharge status: Home / Self Care | Payer: Self-pay | Source: Home / Self Care | Attending: Urology

## 2019-12-17 ENCOUNTER — Ambulatory Visit (HOSPITAL_COMMUNITY): Payer: Managed Care, Other (non HMO)

## 2019-12-17 ENCOUNTER — Other Ambulatory Visit: Payer: Self-pay

## 2019-12-17 ENCOUNTER — Ambulatory Visit (HOSPITAL_COMMUNITY)
Admission: RE | Admit: 2019-12-17 | Discharge: 2019-12-17 | Disposition: A | Discharge status: Home / Self Care | Payer: Managed Care, Other (non HMO) | Attending: Urology | Admitting: Urology

## 2019-12-17 DIAGNOSIS — Z87442 Personal history of urinary calculi: Secondary | ICD-10-CM | POA: Insufficient documentation

## 2019-12-17 DIAGNOSIS — Z6839 Body mass index (BMI) 39.0-39.9, adult: Secondary | ICD-10-CM | POA: Insufficient documentation

## 2019-12-17 DIAGNOSIS — I10 Essential (primary) hypertension: Secondary | ICD-10-CM | POA: Insufficient documentation

## 2019-12-17 DIAGNOSIS — E785 Hyperlipidemia, unspecified: Secondary | ICD-10-CM | POA: Diagnosis not present

## 2019-12-17 DIAGNOSIS — Z8249 Family history of ischemic heart disease and other diseases of the circulatory system: Secondary | ICD-10-CM | POA: Insufficient documentation

## 2019-12-17 DIAGNOSIS — Z466 Encounter for fitting and adjustment of urinary device: Secondary | ICD-10-CM | POA: Insufficient documentation

## 2019-12-17 DIAGNOSIS — Z79899 Other long term (current) drug therapy: Secondary | ICD-10-CM | POA: Insufficient documentation

## 2019-12-17 DIAGNOSIS — G709 Myoneural disorder, unspecified: Secondary | ICD-10-CM | POA: Insufficient documentation

## 2019-12-17 DIAGNOSIS — N201 Calculus of ureter: Secondary | ICD-10-CM | POA: Insufficient documentation

## 2019-12-17 DIAGNOSIS — F419 Anxiety disorder, unspecified: Secondary | ICD-10-CM | POA: Diagnosis not present

## 2019-12-17 DIAGNOSIS — Z79890 Hormone replacement therapy (postmenopausal): Secondary | ICD-10-CM | POA: Diagnosis not present

## 2019-12-17 DIAGNOSIS — Z87891 Personal history of nicotine dependence: Secondary | ICD-10-CM | POA: Diagnosis not present

## 2019-12-17 DIAGNOSIS — F329 Major depressive disorder, single episode, unspecified: Secondary | ICD-10-CM | POA: Insufficient documentation

## 2019-12-17 HISTORY — PX: CYSTOSCOPY/URETEROSCOPY/HOLMIUM LASER/STENT PLACEMENT: SHX6546

## 2019-12-17 LAB — PREGNANCY, URINE: Preg Test, Ur: NEGATIVE

## 2019-12-17 SURGERY — CYSTOSCOPY/URETEROSCOPY/HOLMIUM LASER/STENT PLACEMENT
Anesthesia: General | Laterality: Left

## 2019-12-17 MED ORDER — LIDOCAINE 2% (20 MG/ML) 5 ML SYRINGE
INTRAMUSCULAR | Status: AC
  Filled 2019-12-17: qty 5

## 2019-12-17 MED ORDER — MIDAZOLAM HCL 2 MG/2ML IJ SOLN
INTRAMUSCULAR | Status: AC
  Filled 2019-12-17: qty 2

## 2019-12-17 MED ORDER — PROPOFOL 10 MG/ML IV BOLUS
INTRAVENOUS | Status: DC | PRN
  Administered 2019-12-17: 200 mg via INTRAVENOUS

## 2019-12-17 MED ORDER — ORAL CARE MOUTH RINSE: 15.0000 mL | Freq: Once | OROMUCOSAL | Status: AC

## 2019-12-17 MED ORDER — CEFAZOLIN SODIUM-DEXTROSE 2-4 GM/100ML-% IV SOLN
2.0000 g | INTRAVENOUS | Status: AC
  Administered 2019-12-17: 2 g via INTRAVENOUS
  Filled 2019-12-17: qty 100

## 2019-12-17 MED ORDER — FENTANYL CITRATE (PF) 100 MCG/2ML IJ SOLN
INTRAMUSCULAR | Status: AC
  Filled 2019-12-17: qty 2

## 2019-12-17 MED ORDER — LIDOCAINE 2% (20 MG/ML) 5 ML SYRINGE
INTRAMUSCULAR | Status: DC | PRN
  Administered 2019-12-17: 80 mg via INTRAVENOUS

## 2019-12-17 MED ORDER — ONDANSETRON HCL 4 MG/2ML IJ SOLN
INTRAMUSCULAR | Status: DC | PRN
  Administered 2019-12-17: 4 mg via INTRAVENOUS

## 2019-12-17 MED ORDER — DEXAMETHASONE SODIUM PHOSPHATE 10 MG/ML IJ SOLN
INTRAMUSCULAR | Status: AC
  Filled 2019-12-17: qty 1

## 2019-12-17 MED ORDER — SODIUM CHLORIDE 0.9 % IR SOLN
Status: DC | PRN
  Administered 2019-12-17: 6000 mL

## 2019-12-17 MED ORDER — DEXAMETHASONE SODIUM PHOSPHATE 10 MG/ML IJ SOLN
INTRAMUSCULAR | Status: DC | PRN
  Administered 2019-12-17: 10 mg via INTRAVENOUS

## 2019-12-17 MED ORDER — CHLORHEXIDINE GLUCONATE 0.12 % MT SOLN
15.0000 mL | Freq: Once | OROMUCOSAL | Status: AC
  Administered 2019-12-17: 15 mL via OROMUCOSAL

## 2019-12-17 MED ORDER — ONDANSETRON HCL 4 MG/2ML IJ SOLN
INTRAMUSCULAR | Status: AC
  Filled 2019-12-17: qty 2

## 2019-12-17 MED ORDER — FENTANYL CITRATE (PF) 100 MCG/2ML IJ SOLN
INTRAMUSCULAR | Status: DC | PRN
  Administered 2019-12-17: 50 ug via INTRAVENOUS
  Administered 2019-12-17 (×2): 25 ug via INTRAVENOUS

## 2019-12-17 MED ORDER — MIDAZOLAM HCL 5 MG/5ML IJ SOLN
INTRAMUSCULAR | Status: DC | PRN
  Administered 2019-12-17: 2 mg via INTRAVENOUS

## 2019-12-17 MED ORDER — SODIUM CHLORIDE 0.9% FLUSH: 3.0000 mL | Freq: Two times a day (BID) | INTRAVENOUS | Status: DC

## 2019-12-17 MED ORDER — OXYCODONE HCL 5 MG PO TABS: 5.0000 mg | ORAL_TABLET | Freq: Four times a day (QID) | ORAL | 0 refills | Status: DC | PRN

## 2019-12-17 MED ORDER — LACTATED RINGERS IV SOLN: INTRAVENOUS | Status: DC

## 2019-12-17 SURGICAL SUPPLY — 22 items
BAG URO CATCHER STRL LF (MISCELLANEOUS) ×3 IMPLANT
BASKET STONE NCOMPASS (UROLOGICAL SUPPLIES) IMPLANT
CATH URET 5FR 28IN OPEN ENDED (CATHETERS) IMPLANT
CATH URET DUAL LUMEN 6-10FR 50 (CATHETERS) IMPLANT
CLOTH BEACON ORANGE TIMEOUT ST (SAFETY) ×3 IMPLANT
EXTRACTOR STONE NITINOL NGAGE (UROLOGICAL SUPPLIES) ×2 IMPLANT
FIBER LASER FLEXIVA 1000 (UROLOGICAL SUPPLIES) IMPLANT
FIBER LASER FLEXIVA 365 (UROLOGICAL SUPPLIES) IMPLANT
FIBER LASER FLEXIVA 550 (UROLOGICAL SUPPLIES) IMPLANT
FIBER LASER TRAC TIP (UROLOGICAL SUPPLIES) IMPLANT
GLOVE SURG SS PI 8.0 STRL IVOR (GLOVE) ×3 IMPLANT
GOWN STRL REUS W/TWL XL LVL3 (GOWN DISPOSABLE) ×3 IMPLANT
GUIDEWIRE STR DUAL SENSOR (WIRE) ×3 IMPLANT
IV NS IRRIG 3000ML ARTHROMATIC (IV SOLUTION) ×3 IMPLANT
KIT TURNOVER KIT A (KITS) IMPLANT
MANIFOLD NEPTUNE II (INSTRUMENTS) ×3 IMPLANT
PACK CYSTO (CUSTOM PROCEDURE TRAY) ×3 IMPLANT
SHEATH URETERAL 12FRX35CM (MISCELLANEOUS) ×3 IMPLANT
STENT URET 6FRX24 CONTOUR (STENTS) ×3 IMPLANT
TUBING CONNECTING 10 (TUBING) ×2 IMPLANT
TUBING CONNECTING 10' (TUBING) ×1
TUBING UROLOGY SET (TUBING) ×3 IMPLANT

## 2019-12-17 NOTE — Op Note (Signed)
Procedure: 1.  Cystoscopy with stent removal. 2.  Left ureteroscopic stone extraction. 3.  Insertion of left double-J stent. 4.  Application of fluoroscopy.  Preop diagnosis: Left proximal ureteral stone.  Postop diagnosis: Same.  Surgeon: Dr. Bjorn Pippin.  Anesthesia: General.  Specimen: Stone.  Drain: 6 [place] by 24 cm left contour double-J stent with tether.  EBL: None.  Complications: None.  Indications: Sandra Carroll is a 54 year old female who was originally seen for 4 mm obstructing stone with sepsis.  She underwent stenting on 12/09/2019 and returns now for stone removal.  Procedure: She was taken operating room where a general anesthetic was induced.  She was given antibiotics.  She was placed in lithotomy position and fitted with PAS hose.  Her perineum and genitalia were prepped with Betadine solution she was draped in usual sterile fashion.  Cystoscopy was performed using a 22 [place] scope and 30 degree lens.  Examination revealed the stent at the ureteral meatus on the left.  The stent was grasped and moved to the urethral meatus.  A sensor wire was then passed up the stent to the kidney and the stent was removed.  A 35 cm digital access sheath inner core was passed over the wire.  There was some resistance in the upper mid ureter but it then passed to just below the kidney.  The dual-lumen digital flexible scope was then advanced alongside the wire and the ureter and intrarenal collecting system was inspected.  The stone was eventually identified at the UPJ and it was grasped with an engage basket and removed intact.  Reinspection of the collecting system revealed no significant residual stone material in the ureter or the kidney and no significant ureteral injury.  The cystoscope was then reinserted over the wire and a 6 [place] by 24 cm contour double-J stent with tether was passed over the wire to the kidney.  The bladder was drained the cystoscope was removed.  The stent string  was trimmed to an appropriate length and tucked vaginally.  Final fluoroscopy demonstrated good stent position proximally and distally.  She was taken down from lithotomy position, her anesthetic was reversed and she was moved recovery in stable condition.  There were no complications.  She will be given her stone to bring to the office for analysis.

## 2019-12-17 NOTE — Transfer of Care (Signed)
Immediate Anesthesia Transfer of Care Note  Patient: Sandra Carroll  Procedure(s) Performed: LEFT URETEROSCOPY/STENT EXCHANGE/ EXTRACTION OF LEFT URETERAL CALCULI (Left )  Patient Location: PACU  Anesthesia Type:General  Level of Consciousness: awake, alert  and oriented  Airway & Oxygen Therapy: Patient Spontanous Breathing and Patient connected to face mask oxygen  Post-op Assessment: Report given to RN and Post -op Vital signs reviewed and stable  Post vital signs: Reviewed and stable  Last Vitals:  Vitals Value Taken Time  BP 128/75 12/17/19 1324  Temp 36.5 C 12/17/19 1324  Pulse 55 12/17/19 1326  Resp 14 12/17/19 1326  SpO2 100 % 12/17/19 1326  Vitals shown include unvalidated device data.  Last Pain:  Vitals:   12/17/19 1324  TempSrc:   PainSc: (P) 0-No pain      Patients Stated Pain Goal: 4 (12/17/19 1204)  Complications: No complications documented.

## 2019-12-17 NOTE — Anesthesia Postprocedure Evaluation (Signed)
Anesthesia Post Note  Patient: Sandra Carroll  Procedure(s) Performed: LEFT URETEROSCOPY/STENT EXCHANGE/ EXTRACTION OF LEFT URETERAL CALCULI (Left )     Patient location during evaluation: PACU Anesthesia Type: General Level of consciousness: awake Pain management: pain level controlled Vital Signs Assessment: post-procedure vital signs reviewed and stable Respiratory status: spontaneous breathing Cardiovascular status: stable Postop Assessment: no apparent nausea or vomiting Anesthetic complications: no   No complications documented.  Last Vitals:  Vitals:   12/17/19 1400 12/17/19 1419  BP: 127/85 140/61  Pulse: 68 63  Resp: 18 18  Temp:  36.4 C  SpO2: 94% 95%    Last Pain:  Vitals:   12/17/19 1400  TempSrc:   PainSc: 0-No pain                 Caren Macadam

## 2019-12-17 NOTE — Anesthesia Procedure Notes (Signed)
Procedure Name: LMA Insertion Date/Time: 12/17/2019 12:46 PM Performed by: Pearson Grippe, CRNA Pre-anesthesia Checklist: Patient identified, Emergency Drugs available, Suction available and Patient being monitored Patient Re-evaluated:Patient Re-evaluated prior to induction Oxygen Delivery Method: Circle system utilized Preoxygenation: Pre-oxygenation with 100% oxygen Induction Type: IV induction Ventilation: Mask ventilation without difficulty LMA: LMA with gastric port inserted LMA Size: 4.0 Number of attempts: 1 Airway Equipment and Method: Bite block Placement Confirmation: positive ETCO2 Tube secured with: Tape Dental Injury: Teeth and Oropharynx as per pre-operative assessment

## 2019-12-17 NOTE — Interval H&P Note (Signed)
History and Physical Interval Note:  12/17/2019 12:22 PM  Sandra Carroll  has presented today for surgery, with the diagnosis of LEFT URETERAL STONE.  The various methods of treatment have been discussed with the patient and family. After consideration of risks, benefits and other options for treatment, the patient has consented to  Procedure(s): LEFT URETEROSCOPY/HOLMIUM LASER/STENT EXCHANGE (Left) as a surgical intervention.  The patient's history has been reviewed, patient examined, no change in status, stable for surgery.  I have reviewed the patient's chart and labs.  Questions were answered to the patient's satisfaction.     Bjorn Pippin

## 2019-12-17 NOTE — Discharge Instructions (Signed)
Ureteral Stent Implantation, Care After This sheet gives you information about how to care for yourself after your procedure. Your health care provider may also give you more specific instructions. If you have problems or questions, contact your health care provider. What can I expect after the procedure? After the procedure, it is common to have:  Nausea.  Mild pain when you urinate. You may feel this pain in your lower back or lower abdomen. The pain should stop within a few minutes after you urinate. This may last for up to 1 week.  A small amount of blood in your urine for several days. Follow these instructions at home: Medicines  Take over-the-counter and prescription medicines only as told by your health care provider.  If you were prescribed an antibiotic medicine, take it as told by your health care provider. Do not stop taking the antibiotic even if you start to feel better.  Do not drive for 24 hours if you were given a sedative during your procedure.  Ask your health care provider if the medicine prescribed to you requires you to avoid driving or using heavy machinery. Activity  Rest as told by your health care provider.  Avoid sitting for a long time without moving. Get up to take short walks every 1-2 hours. This is important to improve blood flow and breathing. Ask for help if you feel weak or unsteady.  Return to your normal activities as told by your health care provider. Ask your health care provider what activities are safe for you. General instructions   Watch for any blood in your urine. Call your health care provider if the amount of blood in your urine increases.  If you have a catheter: ? Follow instructions from your health care provider about taking care of your catheter and collection bag. ? Do not take baths, swim, or use a hot tub until your health care provider approves. Ask your health care provider if you may take showers. You may only be allowed to  take sponge baths.  Drink enough fluid to keep your urine pale yellow.  Do not use any products that contain nicotine or tobacco, such as cigarettes, e-cigarettes, and chewing tobacco. These can delay healing after surgery. If you need help quitting, ask your health care provider.  Keep all follow-up visits as told by your health care provider. This is important. Contact a health care provider if:  You have pain that gets worse or does not get better with medicine, especially pain when you urinate.  You have difficulty urinating.  You feel nauseous or you vomit repeatedly during a period of more than 2 days after the procedure. Get help right away if:  Your urine is dark red or has blood clots in it.  You are leaking urine (have incontinence).  The end of the stent comes out of your urethra.  You cannot urinate.  You have sudden, sharp, or severe pain in your abdomen or lower back.  You have a fever.  You have swelling or pain in your legs.  You have difficulty breathing. Summary  After the procedure, it is common to have mild pain when you urinate that goes away within a few minutes after you urinate. This may last for up to 1 week.  Watch for any blood in your urine. Call your health care provider if the amount of blood in your urine increases.  Take over-the-counter and prescription medicines only as told by your health care provider.  Drink   enough fluid to keep your urine pale yellow.  You may remove the stent by pulling the attached string that is tucked vaginally on Friday morning.  IF you doing feel comfortable removing the stent, please call the office to have it done.   Please bring your stone to the office for analysis.  This information is not intended to replace advice given to you by your health care provider. Make sure you discuss any questions you have with your health care provider. Document Revised: 03/13/2018 Document Reviewed: 03/14/2018 Elsevier  Patient Education  2020 [employer].

## 2019-12-18 ENCOUNTER — Encounter (HOSPITAL_COMMUNITY): Payer: Self-pay | Admitting: Urology

## 2019-12-20 ENCOUNTER — Ambulatory Visit: Payer: Managed Care, Other (non HMO) | Admitting: Family Medicine

## 2020-01-09 ENCOUNTER — Encounter: Payer: Self-pay | Admitting: Family Medicine

## 2020-01-09 ENCOUNTER — Telehealth: Payer: Self-pay | Admitting: Pharmacist

## 2020-01-09 ENCOUNTER — Ambulatory Visit (INDEPENDENT_AMBULATORY_CARE_PROVIDER_SITE_OTHER): Payer: Managed Care, Other (non HMO) | Admitting: Family Medicine

## 2020-01-09 ENCOUNTER — Other Ambulatory Visit: Payer: Self-pay

## 2020-01-09 VITALS — BP 139/95 | HR 95 | Temp 97.6°F | Ht 62.0 in | Wt 198.6 lb

## 2020-01-09 DIAGNOSIS — M5136 Other intervertebral disc degeneration, lumbar region: Secondary | ICD-10-CM

## 2020-01-09 DIAGNOSIS — I10 Essential (primary) hypertension: Secondary | ICD-10-CM | POA: Diagnosis not present

## 2020-01-09 DIAGNOSIS — F320 Major depressive disorder, single episode, mild: Secondary | ICD-10-CM | POA: Diagnosis not present

## 2020-01-09 DIAGNOSIS — F419 Anxiety disorder, unspecified: Secondary | ICD-10-CM

## 2020-01-09 DIAGNOSIS — G43009 Migraine without aura, not intractable, without status migrainosus: Secondary | ICD-10-CM | POA: Insufficient documentation

## 2020-01-09 DIAGNOSIS — Z87442 Personal history of urinary calculi: Secondary | ICD-10-CM

## 2020-01-09 DIAGNOSIS — Z1159 Encounter for screening for other viral diseases: Secondary | ICD-10-CM

## 2020-01-09 DIAGNOSIS — E7800 Pure hypercholesterolemia, unspecified: Secondary | ICD-10-CM

## 2020-01-09 DIAGNOSIS — N938 Other specified abnormal uterine and vaginal bleeding: Secondary | ICD-10-CM

## 2020-01-09 DIAGNOSIS — Z114 Encounter for screening for human immunodeficiency virus [HIV]: Secondary | ICD-10-CM

## 2020-01-09 DIAGNOSIS — D649 Anemia, unspecified: Secondary | ICD-10-CM | POA: Insufficient documentation

## 2020-01-09 MED ORDER — NURTEC 75 MG PO TBDP: 75.0000 mg | ORAL_TABLET | ORAL | 2 refills | Status: DC | PRN

## 2020-01-09 MED ORDER — DESOGESTREL-ETHINYL ESTRADIOL 0.15-0.02/0.01 MG (21/5) PO TABS: 1.0000 | ORAL_TABLET | Freq: Every day | ORAL | 2 refills | Status: DC

## 2020-01-09 MED ORDER — SIMVASTATIN 20 MG PO TABS: 20.0000 mg | ORAL_TABLET | Freq: Every morning | ORAL | 1 refills | Status: DC

## 2020-01-09 MED ORDER — LISINOPRIL 20 MG PO TABS: ORAL_TABLET | ORAL | 1 refills | Status: DC

## 2020-01-09 MED ORDER — CITALOPRAM HYDROBROMIDE 20 MG PO TABS: 20.0000 mg | ORAL_TABLET | Freq: Every day | ORAL | 1 refills | Status: DC

## 2020-01-09 NOTE — Patient Instructions (Signed)

## 2020-01-09 NOTE — Telephone Encounter (Signed)
Patient with ~4 migraines per month Has tried and failed OTC products Counseled patient on Nurtec ODT for abortive therapy #4 tablets given EOF#[id number], EXP 10/22

## 2020-01-09 NOTE — Progress Notes (Signed)
Assessment & Plan:  1. Essential hypertension - Patient to monitor blood pressure 2-3 times per week and keep a log so that she can bring it back to her next appointment for review. - lisinopril (ZESTRIL) 20 MG tablet; TAKE ONE (1) TABLET EACH DAY  Dispense: 90 tablet; Refill: 1 - Lipid panel  2. Depression, major, single episode, mild (HCC) - Well controlled on current regimen.  - citalopram (CELEXA) 20 MG tablet; Take 1 tablet (20 mg total) by mouth daily.  Dispense: 90 tablet; Refill: 1  3. Anxiety - Well controlled on current regimen.  - citalopram (CELEXA) 20 MG tablet; Take 1 tablet (20 mg total) by mouth daily.  Dispense: 90 tablet; Refill: 1  4. Pure hypercholesterolemia - Labs today to assess.  - simvastatin (ZOCOR) 20 MG tablet; Take 1 tablet (20 mg total) by mouth every morning.  Dispense: 90 tablet; Refill: 1 - Lipid panel  5. DUB (dysfunctional uterine bleeding) - Well controlled on current regimen.  - desogestrel-ethinyl estradiol (VIORELE) 0.15-0.02/0.01 MG (21/5) tablet; Take 1 tablet by mouth daily.  Dispense: 28 tablet; Refill: 2  6. Migraine without aura and without status migrainosus, not intractable - Uncontrolled. Started on Nurtec today.  - Rimegepant Sulfate (NURTEC) 75 MG TBDP; Take 75 mg by mouth every other day as needed.  Dispense: 30 tablet; Refill: 2  7. History of kidney stones - Encouraged to follow-up with urology as she reported she had no intention of doing so. Previously took Flomax daily but has not in the past few months - encouraged to discuss with urology.   8. DDD (degenerative disc disease), lumbar - Ambulatory referral to Pain Clinic  9. Anemia, unspecified type - CBC with Differential/Platelet  10. Encounter for screening for HIV - HIV Antibody (routine testing w rflx)  11. Encounter for hepatitis C screening test for low risk patient - Hepatitis C antibody   Return in about 3 weeks (around 01/30/2020) for annual physical/pap  w. f/u HTN, migraines.  Sandra BostonBritney Renita Brocks, MSN, APRN, FNP-C [facility]  Subjective:    Patient ID: Sandra Carroll, female    DOB: 05/07/1966, 54 y.o.   MRN: [id number]  Patient Care Team: Gwenlyn FudgeJoyce, Analie Katzman F, FNP as PCP - General (Family Medicine) Jena Gaussourk, Gerrit Friendsobert M, MD as Consulting Physician (Gastroenterology)   Chief Complaint:  Chief Complaint  Patient presents with  . Establish Care    Jones pt.    . Headache    Patient states she has ongoing headaches.  Patient [facility] and was given Fioricet.  Should she stay on?  Marland Kitchen. Referral    Would like referal to pain managment.  . Depression    3 month follow up     HPI: Sandra Carroll is a 54 y.o. female presenting on 01/09/2020 for Establish Care Yetta Barre(Jones pt.  ), Headache (Patient states she has ongoing headaches.  [facility] given Fioricet.  Should she stay on?), Referral (Would like referal to pain managment.), and Depression (3 month follow up )  Patient reports chronic migraines that have been worse in the past year.  She is currently having episodes with her migraines 2-3 times per month and each time they last 3 days.  The pain mostly occurs at the back of her head/neck area and behind her eyes.  She often just wakes up with them.  Denies any aura.  Does report light sensitivity.  She reports sometimes ibuprofen relieves  the pain a little.  She has tried Imitrex in the past.  She has a history of hypertension and is not a good candidate for triptans.  Patient has a history of sinus issues.  She does not like using the Flonase because of the taste.  She does take an antihistamine which relieves her symptoms.  Patient does not check her blood pressure at home.  She would like a referral to [facility] on [facility] for pain management.  Feels she is well controlled on Celexa 20 mg once daily for anxiety and depression.  Depression screen [facility] 2/9  01/09/2020 07/05/2019 05/31/2019  Decreased Interest 1 2 2   Down, Depressed, Hopeless 3 3 3   PHQ - 2 Score 4 5 5   Altered sleeping 0 2 0  Tired, decreased energy 1 3 2   Change in appetite 2 1 1   Feeling bad or failure about yourself  2 2 3   Trouble concentrating 1 - 0  Moving slowly or fidgety/restless 0 1 0  Suicidal thoughts 0 1 0  PHQ-9 Score 10 15 11   Difficult doing work/chores - Very difficult Somewhat difficult   GAD 7 : Generalized Anxiety Score 01/09/2020 07/05/2019 05/31/2019  Nervous, Anxious, on Edge 1 3 1   Control/stop worrying 1 3 3   Worry too much - different things 1 2 3   Trouble relaxing 2 2 3   Restless 0 0 2  Easily annoyed or irritable 1 3 1   Afraid - awful might happen 0 1 3  Total GAD 7 Score 6 14 16   Anxiety Difficulty - Somewhat difficult Somewhat difficult    Social history:  Relevant past medical, surgical, family and social history reviewed and updated as indicated. Interim medical history since our last visit reviewed.  Allergies and medications reviewed and updated.  DATA REVIEWED: CHART IN EPIC  ROS: Negative unless specifically indicated above in HPI.    Current Outpatient Medications:  .  butalbital-acetaminophen-caffeine (FIORICET) 50-325-40 MG tablet, Take 1-2 tablets by mouth every 6 (six) hours as needed for headache., Disp: 14 tablet, Rfl: 0 .  citalopram (CELEXA) 20 MG tablet, Take 0.5 tablets (10 mg total) by mouth daily. (Patient taking differently: Take by mouth daily. ), Disp: 30 tablet, Rfl: 2 .  desogestrel-ethinyl estradiol (VIORELE) 0.15-0.02/0.01 MG (21/5) tablet, TAKE ONE (1) TABLET EACH DAY (Needs physical) (Patient taking differently: Take 1 tablet by mouth daily. ), Disp: 28 tablet, Rfl: 0 .  lisinopril (ZESTRIL) 20 MG tablet, TAKE ONE (1) TABLET EACH DAY, Disp: 30 tablet, Rfl: 11 .  simvastatin (ZOCOR) 20 MG tablet, Take 1 tablet (20 mg total) by mouth every evening. (Patient taking differently: Take 20 mg by mouth every  morning. ), Disp: 30 tablet, Rfl: 11   No Known Allergies Past Medical History:  Diagnosis Date  . Anxiety   . Chronic back pain   . Complication of anesthesia 1991 and 1998   childbirth --reaction to anesthesia was itching.   . DDD (degenerative disc disease)   . Depression   . Headache    migraines  . Hemorrhoids   . History of kidney stones   . Hyperlipidemia   . Hypertension   . PONV (postoperative nausea and vomiting)     Past Surgical History:  Procedure Laterality Date  . ADENOIDECTOMY  1978  . CESAREAN SECTION  1991 1998   pt had itching with 1991 c-section.   . COLONOSCOPY N/A 03/01/2013   Dr. - normal rectum except for anal canal/internal hemorrhoidal tags. colonic  mucosa appeared normal.  . CYSTOSCOPY W/ URETERAL STENT PLACEMENT Left 12/09/2019   Procedure: CYSTOSCOPY WITH RETROGRADE PYELOGRAM/URETERAL STENT PLACEMENT;  Surgeon: Bjorn Pippin, MD;  Location: [facility];  Service: Urology;  Laterality: Left;  . CYSTOSCOPY/URETEROSCOPY/HOLMIUM LASER/STENT PLACEMENT Left 12/17/2019   Procedure: LEFT URETEROSCOPY/STENT EXCHANGE/ EXTRACTION OF LEFT URETERAL CALCULI;  Surgeon: Bjorn Pippin, MD;  Location: [facility];  Service: Urology;  Laterality: Left;  . KNEE ARTHROSCOPY  2003  . LITHOTRIPSY    . LUMBAR LAMINECTOMY/DECOMPRESSION MICRODISCECTOMY  03/02/2012   Procedure: LUMBAR LAMINECTOMY/DECOMPRESSION MICRODISCECTOMY 1 LEVEL;  Surgeon: Carmela Hurt, MD;  Location: [facility];  Service: Neurosurgery;  Laterality: Left;  LEFT Lumbar Five-Sacral One Laminectomy for Decompression     Social History   Socioeconomic History  . Marital status: Single    Spouse name: Not on file  . Number of children: Not on file  . Years of education: Not on file  . Highest education level: Not on file  Occupational History  . Not on file  Tobacco Use  . Smoking status: Former Smoker    Quit date: 2010    Years since quitting: 11.5  . Smokeless tobacco: Never Used  Vaping Use  . Vaping  Use: Never used  Substance and Sexual Activity  . Alcohol use: No  . Drug use: No  . Sexual activity: Not Currently    Birth control/protection: Pill  Other Topics Concern  . Not on file  Social History Narrative  . Not on file   Social Determinants of Health   Financial Resource Strain:   . Difficulty of Paying Living Expenses:   Food Insecurity:   . Worried About Programme researcher, broadcasting/film/video in the Last Year:   . Barista in the Last Year:   Transportation Needs:   . Freight forwarder (Medical):   Marland Kitchen Lack of Transportation (Non-Medical):   Physical Activity:   . Days of Exercise per Week:   . Minutes of Exercise per Session:   Stress:   . Feeling of Stress :   Social Connections:   . Frequency of Communication with Friends and Family:   . Frequency of Social Gatherings with Friends and Family:   . Attends Religious Services:   . Active Member of Clubs or Organizations:   . Attends Banker Meetings:   Marland Kitchen Marital Status:   Intimate Partner Violence:   . Fear of Current or Ex-Partner:   . Emotionally Abused:   Marland Kitchen Physically Abused:   . Sexually Abused:         Objective:    BP (!) 139/95   Pulse 95   Temp 97.6 F (36.4 C) (Temporal)   Ht 5\' 2"  (1.575 m)   Wt 198 lb 9.6 oz (90.1 kg)   SpO2 95%   BMI 36.32 kg/m   Wt Readings from Last 3 Encounters:  01/09/20 198 lb 9.6 oz (90.1 kg)  12/17/19 217 lb 6.5 oz (98.6 kg)  12/13/19 213 lb (96.6 kg)    Physical Exam Vitals reviewed.  Constitutional:      General: She is not in acute distress.    Appearance: Normal appearance. She is obese. She is not ill-appearing, toxic-appearing or diaphoretic.  HENT:     Head: Normocephalic and atraumatic.  Eyes:     General: No scleral icterus.       Right eye: No discharge.        Left eye: No discharge.     Conjunctiva/sclera: Conjunctivae  normal.  Cardiovascular:     Rate and Rhythm: Normal rate and regular rhythm.     Heart sounds: Normal heart  sounds. No murmur heard.  No friction rub. No gallop.   Pulmonary:     Effort: Pulmonary effort is normal. No respiratory distress.     Breath sounds: Normal breath sounds. No stridor. No wheezing, rhonchi or rales.  Musculoskeletal:        General: Normal range of motion.     Cervical back: Normal range of motion.  Skin:    General: Skin is warm and dry.     Capillary Refill: Capillary refill takes less than 2 seconds.  Neurological:     General: No focal deficit present.     Mental Status: She is alert and oriented to person, place, and time. Mental status is at baseline.  Psychiatric:        Mood and Affect: Mood normal.        Behavior: Behavior normal.        Thought Content: Thought content normal.        Judgment: Judgment normal.     No results found for: TSH Lab Results  Component Value Date   WBC 7.9 12/12/2019   HGB 10.1 (L) 12/12/2019   HCT 30.1 (L) 12/12/2019   MCV 92.9 12/12/2019   PLT 218 12/12/2019   Lab Results  Component Value Date   NA 139 12/13/2019   K 3.9 12/13/2019   CO2 26 12/13/2019   GLUCOSE 96 12/13/2019   BUN 12 12/13/2019   CREATININE 0.77 12/13/2019   BILITOT 1.5 (H) 12/09/2019   ALKPHOS 59 12/09/2019   AST 22 12/09/2019   ALT 18 12/09/2019   PROT 7.1 12/09/2019   ALBUMIN 3.1 (L) 12/09/2019   CALCIUM 9.7 12/13/2019   ANIONGAP 9 12/13/2019   No results found for: CHOL No results found for: HDL No results found for: LDLCALC No results found for: TRIG No results found for: CHOLHDL No results found for: EZMO2H

## 2020-01-10 ENCOUNTER — Telehealth: Payer: Self-pay

## 2020-01-10 ENCOUNTER — Encounter: Payer: Self-pay | Admitting: Family Medicine

## 2020-01-10 LAB — CBC WITH DIFFERENTIAL/PLATELET
Basophils Absolute: 0.1 10*3/uL (ref 0.0–0.2)
Basos: 1 %
EOS (ABSOLUTE): 0 10*3/uL (ref 0.0–0.4)
Eos: 0 %
Hematocrit: 40.3 % (ref 34.0–46.6)
Hemoglobin: 13.6 g/dL (ref 11.1–15.9)
Immature Grans (Abs): 0 10*3/uL (ref 0.0–0.1)
Immature Granulocytes: 0 %
Lymphocytes Absolute: 2.2 10*3/uL (ref 0.7–3.1)
Lymphs: 26 %
MCH: 31 pg (ref 26.6–33.0)
MCHC: 33.7 g/dL (ref 31.5–35.7)
MCV: 92 fL (ref 79–97)
Monocytes Absolute: 0.5 10*3/uL (ref 0.1–0.9)
Monocytes: 6 %
Neutrophils Absolute: 5.7 10*3/uL (ref 1.4–7.0)
Neutrophils: 67 %
Platelets: 340 10*3/uL (ref 150–450)
RBC: 4.39 x10E6/uL (ref 3.77–5.28)
RDW: 12.8 % (ref 11.7–15.4)
WBC: 8.5 10*3/uL (ref 3.4–10.8)

## 2020-01-10 LAB — HIV ANTIBODY (ROUTINE TESTING W REFLEX): HIV Screen 4th Generation wRfx: NONREACTIVE

## 2020-01-10 LAB — LIPID PANEL
Chol/HDL Ratio: 3.9 ratio (ref 0.0–4.4)
Cholesterol, Total: 217 mg/dL — ABNORMAL HIGH (ref 100–199)
HDL: 55 mg/dL (ref >39)
LDL Chol Calc (NIH): 135 mg/dL — ABNORMAL HIGH (ref 0–99)
Triglycerides: 153 mg/dL — ABNORMAL HIGH (ref 0–149)
VLDL Cholesterol Cal: 27 mg/dL (ref 5–40)

## 2020-01-10 LAB — HEPATITIS C ANTIBODY: Hep C Virus Ab: 0.1 s/co ratio (ref 0.0–0.9)

## 2020-01-10 NOTE — Telephone Encounter (Addendum)
(  Key: [id number])  Your information has been submitted and will be reviewed by [employer]. You may close this dialog, return to your dashboard, and perform other tasks. An electronic determination will be received in CoverMyMeds within 72-120 hours. You can see the latest determination by locating this request on your dashboard or by reopening this request. You will receive a fax copy of the determination. If Rosann Auerbach has not responded in 120 hours, contact [employer] at [phone].  This request has received a favorable outcome.

## 2020-01-13 NOTE — Telephone Encounter (Signed)
Approved pharmacy aware. °

## 2020-02-27 ENCOUNTER — Encounter: Payer: Managed Care, Other (non HMO) | Admitting: Family Medicine

## 2020-03-02 ENCOUNTER — Encounter: Payer: Self-pay | Admitting: Family Medicine

## 2020-03-12 ENCOUNTER — Ambulatory Visit (INDEPENDENT_AMBULATORY_CARE_PROVIDER_SITE_OTHER): Payer: Self-pay | Admitting: Nurse Practitioner

## 2020-03-12 ENCOUNTER — Other Ambulatory Visit: Payer: Self-pay

## 2020-03-12 ENCOUNTER — Encounter: Payer: Self-pay | Admitting: Nurse Practitioner

## 2020-03-12 VITALS — BP 108/64 | HR 96 | Temp 98.1°F | Ht 62.0 in | Wt 204.0 lb

## 2020-03-12 DIAGNOSIS — E7800 Pure hypercholesterolemia, unspecified: Secondary | ICD-10-CM

## 2020-03-12 DIAGNOSIS — F320 Major depressive disorder, single episode, mild: Secondary | ICD-10-CM

## 2020-03-12 DIAGNOSIS — F419 Anxiety disorder, unspecified: Secondary | ICD-10-CM

## 2020-03-12 DIAGNOSIS — G43009 Migraine without aura, not intractable, without status migrainosus: Secondary | ICD-10-CM

## 2020-03-12 DIAGNOSIS — N938 Other specified abnormal uterine and vaginal bleeding: Secondary | ICD-10-CM

## 2020-03-12 DIAGNOSIS — I10 Essential (primary) hypertension: Secondary | ICD-10-CM

## 2020-03-12 MED ORDER — LISINOPRIL 20 MG PO TABS: ORAL_TABLET | ORAL | 1 refills | Status: DC

## 2020-03-12 MED ORDER — SIMVASTATIN 20 MG PO TABS: 20.0000 mg | ORAL_TABLET | Freq: Every morning | ORAL | 1 refills | Status: DC

## 2020-03-12 MED ORDER — CITALOPRAM HYDROBROMIDE 20 MG PO TABS: 20.0000 mg | ORAL_TABLET | Freq: Every day | ORAL | 1 refills | Status: DC

## 2020-03-12 MED ORDER — NURTEC 75 MG PO TBDP: 75.0000 mg | ORAL_TABLET | ORAL | 2 refills | Status: DC | PRN

## 2020-03-12 MED ORDER — DESOGESTREL-ETHINYL ESTRADIOL 0.15-0.02/0.01 MG (21/5) PO TABS: 1.0000 | ORAL_TABLET | Freq: Every day | ORAL | 2 refills | Status: DC

## 2020-03-12 NOTE — Assessment & Plan Note (Signed)
Patient is following up with hyperlipidemia.  She was diagnosed in 2018.  Total cholesterol last 2 months is elevated  217, HDL 55, LDL 35 triglycerides 153.  Patient is not reporting any signs or symptoms of hyperlipidemia. Continue to educate patient on diet and exercise modification.  Provided printed handouts to patient Follow-up October for repeat labs

## 2020-03-12 NOTE — Assessment & Plan Note (Signed)
Patient is following up for anxiety disorder.  She was first diagnosed in 2021 about 8 months ago.  She is stable, she is taking medication as prescribed, current medication Celexa 10 mg daily.  Patient is not reporting any adverse effect from medication. Provided education to patient with printed handouts given. No changes to current medication dose Follow-up with worsening or unresolved symptoms.

## 2020-03-12 NOTE — Assessment & Plan Note (Signed)
Patient presents for follow-up of hypertension, patient was diagnosed in 2018 the patient is tolerating medication well without side effects.  Compliance with treatment has been good including taking medication as directed.  Maintains a healthy diet and regular exercise regimen.  And follows up as directed.  Current medication lisinopril 20 mg daily.  No changes to medication dose. Provided education with printed handouts given.  Continue diet and exercise modification.

## 2020-03-12 NOTE — Patient Instructions (Signed)
High Cholesterol ° °High cholesterol is a condition in which the blood has high levels of a white, waxy, fat-like substance (cholesterol). The human body needs small amounts of cholesterol. The liver makes all the cholesterol that the body needs. Extra (excess) cholesterol comes from the food that we eat. °Cholesterol is carried from the liver by the blood through the blood vessels. If you have high cholesterol, deposits (plaques) may build up on the walls of your blood vessels (arteries). Plaques make the arteries narrower and stiffer. Cholesterol plaques increase your risk for heart attack and stroke. Work with your health care provider to keep your cholesterol levels in a healthy range. °What increases the risk? °This condition is more likely to develop in people who: °· Eat foods that are high in animal fat (saturated fat) or cholesterol. °· Are overweight. °· Are not getting enough exercise. °· Have a family history of high cholesterol. °What are the signs or symptoms? °There are no symptoms of this condition. °How is this diagnosed? °This condition may be diagnosed from the results of a blood test. °· If you are older than age 20, your health care provider may check your cholesterol every 4-6 years. °· You may be checked more often if you already have high cholesterol or other risk factors for heart disease. °The blood test for cholesterol measures: °· "Bad" cholesterol (LDL cholesterol). This is the main type of cholesterol that causes heart disease. The desired level for LDL is less than 100. °· "Good" cholesterol (HDL cholesterol). This type helps to protect against heart disease by cleaning the arteries and carrying the LDL away. The desired level for HDL is 60 or higher. °· Triglycerides. These are fats that the body can store or burn for energy. The desired number for triglycerides is lower than 150. °· Total cholesterol. This is a measure of the total amount of cholesterol in your blood, including LDL  cholesterol, HDL cholesterol, and triglycerides. A healthy number is less than 200. °How is this treated? °This condition is treated with diet changes, lifestyle changes, and medicines. °Diet changes °· This may include eating more whole grains, fruits, vegetables, nuts, and fish. °· This may also include cutting back on red meat and foods that have a lot of added sugar. °Lifestyle changes °· Changes may include getting at least 40 minutes of aerobic exercise 3 times a week. Aerobic exercises include walking, biking, and swimming. Aerobic exercise along with a healthy diet can help you maintain a healthy weight. °· Changes may also include quitting smoking. °Medicines °· Medicines are usually given if diet and lifestyle changes have failed to reduce your cholesterol to healthy levels. °· Your health care provider may prescribe a statin medicine. Statin medicines have been shown to reduce cholesterol, which can reduce the risk of heart disease. °Follow these instructions at home: °Eating and drinking °If told by your health care provider: °· Eat chicken (without skin), fish, veal, shellfish, ground turkey breast, and round or loin cuts of red meat. °· Do not eat fried foods or fatty meats, such as hot dogs and salami. °· Eat plenty of fruits, such as apples. °· Eat plenty of vegetables, such as broccoli, potatoes, and carrots. °· Eat beans, peas, and lentils. °· Eat grains such as barley, rice, couscous, and bulgur wheat. °· Eat pasta without cream sauces. °· Use skim or nonfat milk, and eat low-fat or nonfat yogurt and cheeses. °· Do not eat or drink whole milk, cream, ice cream, egg yolks,   or hard cheeses.  Do not eat stick margarine or tub margarines that contain trans fats (also called partially hydrogenated oils).  Do not eat saturated tropical oils, such as coconut oil and palm oil.  Do not eat cakes, cookies, crackers, or other baked goods that contain trans fats.  General instructions  Exercise as  directed by your health care provider. Increase your activity level with activities such as gardening, walking, and taking the stairs.  Take over-the-counter and prescription medicines only as told by your health care provider.  Do not use any products that contain nicotine or tobacco, such as cigarettes and e-cigarettes. If you need help quitting, ask your health care provider.  Keep all follow-up visits as told by your health care provider. This is important. Contact a health care provider if:  You are struggling to maintain a healthy diet or weight.  You need help to start on an exercise program.  You need help to stop smoking. Get help right away if:  You have chest pain.  You have trouble breathing. This information is not intended to replace advice given to you by your health care provider. Make sure you discuss any questions you have with your health care provider. Document Revised: 06/09/2017 Document Reviewed: 12/05/2015 Elsevier Patient Education  2020 [employer] Generalized Anxiety Disorder, Adult Generalized anxiety disorder (GAD) is a mental health disorder. People with this condition constantly worry about everyday events. Unlike normal anxiety, worry related to GAD is not triggered by a specific event. These worries also do not fade or get better with time. GAD interferes with life functions, including relationships, work, and school. GAD can vary from mild to severe. People with severe GAD can have intense waves of anxiety with physical symptoms (panic attacks). What are the causes? The exact cause of GAD is not known. What increases the risk? This condition is more likely to develop in:  Women.  People who have a family history of anxiety disorders.  People who are very shy.  People who experience very stressful life events, such as the death of a loved one.  People who have a very stressful family environment. What are the signs or symptoms? People with  GAD often worry excessively about many things in their lives, such as their health and family. They may also be overly concerned about:  Doing well at work.  Being on time.  Natural disasters.  Friendships. Physical symptoms of GAD include:  Fatigue.  Muscle tension or having muscle twitches.  Trembling or feeling shaky.  Being easily startled.  Feeling like your heart is pounding or racing.  Feeling out of breath or like you cannot take a deep breath.  Having trouble falling asleep or staying asleep.  Sweating.  Nausea, diarrhea, or irritable bowel syndrome (IBS).  Headaches.  Trouble concentrating or remembering facts.  Restlessness.  Irritability. How is this diagnosed? Your health care provider can diagnose GAD based on your symptoms and medical history. You will also have a physical exam. The health care provider will ask specific questions about your symptoms, including how severe they are, when they started, and if they come and go. Your health care provider may ask you about your use of alcohol or drugs, including prescription medicines. Your health care provider may refer you to a mental health specialist for further evaluation. Your health care provider will do a thorough examination and may perform additional tests to rule out other possible causes of your symptoms. To be diagnosed with GAD,  a person must have anxiety that:  Is out of his or her control.  Affects several different aspects of his or her life, such as work and relationships.  Causes distress that makes him or her unable to take part in normal activities.  Includes at least three physical symptoms of GAD, such as restlessness, fatigue, trouble concentrating, irritability, muscle tension, or sleep problems. Before your health care provider can confirm a diagnosis of GAD, these symptoms must be present more days than they are not, and they must last for six months or longer. How is this  treated? The following therapies are usually used to treat GAD:  Medicine. Antidepressant medicine is usually prescribed for long-term daily control. Antianxiety medicines may be added in severe cases, especially when panic attacks occur.  Talk therapy (psychotherapy). Certain types of talk therapy can be helpful in treating GAD by providing support, education, and guidance. Options include: ? Cognitive behavioral therapy (CBT). People learn coping skills and techniques to ease their anxiety. They learn to identify unrealistic or negative thoughts and behaviors and to replace them with positive ones. ? Acceptance and commitment therapy (ACT). This treatment teaches people how to be mindful as a way to cope with unwanted thoughts and feelings. ? Biofeedback. This process trains you to manage your body's response (physiological response) through breathing techniques and relaxation methods. You will work with a therapist while machines are used to monitor your physical symptoms.  Stress management techniques. These include yoga, meditation, and exercise. A mental health specialist can help determine which treatment is best for you. Some people see improvement with one type of therapy. However, other people require a combination of therapies. Follow these instructions at home:  Take over-the-counter and prescription medicines only as told by your health care provider.  Try to maintain a normal routine.  Try to anticipate stressful situations and allow extra time to manage them.  Practice any stress management or self-calming techniques as taught by your health care provider.  Do not punish yourself for setbacks or for not making progress.  Try to recognize your accomplishments, even if they are small.  Keep all follow-up visits as told by your health care provider. This is important. Contact a health care provider if:  Your symptoms do not get better.  Your symptoms get worse.  You have  signs of depression, such as: ? A persistently sad, cranky, or irritable mood. ? Loss of enjoyment in activities that used to bring you joy. ? Change in weight or eating. ? Changes in sleeping habits. ? Avoiding friends or family members. ? Loss of energy for normal tasks. ? Feelings of guilt or worthlessness. Get help right away if:  You have serious thoughts about hurting yourself or others. If you ever feel like you may hurt yourself or others, or have thoughts about taking your own life, get help right away. You can go to your nearest emergency department or call:  Your local emergency services (911 in the [place]  A suicide crisis helpline, such as the National Suicide Prevention Lifeline at (737)869-6775. This is open 24 hours a day. Summary  Generalized anxiety disorder (GAD) is a mental health disorder that involves worry that is not triggered by a specific event.  People with GAD often worry excessively about many things in their lives, such as their health and family.  GAD may cause physical symptoms such as restlessness, trouble concentrating, sleep problems, frequent sweating, nausea, diarrhea, headaches, and trembling or muscle twitching.  A  mental health specialist can help determine which treatment is best for you. Some people see improvement with one type of therapy. However, other people require a combination of therapies. This information is not intended to replace advice given to you by your health care provider. Make sure you discuss any questions you have with your health care provider. Document Revised: 05/19/2017 Document Reviewed: 04/26/2016 Elsevier Patient Education  2020 [employer] Hypertension, Adult Hypertension is another name for high blood pressure. High blood pressure forces your heart to work harder to pump blood. This can cause problems over time. There are two numbers in a blood pressure reading. There is a top number (systolic) over a bottom  number (diastolic). It is best to have a blood pressure that is below 120/80. Healthy choices can help lower your blood pressure, or you may need medicine to help lower it. What are the causes? The cause of this condition is not known. Some conditions may be related to high blood pressure. What increases the risk?  Smoking.  Having type 2 diabetes mellitus, high cholesterol, or both.  Not getting enough exercise or physical activity.  Being overweight.  Having too much fat, sugar, calories, or salt (sodium) in your diet.  Drinking too much alcohol.  Having long-term (chronic) kidney disease.  Having a family history of high blood pressure.  Age. Risk increases with age.  Race. You may be at higher risk if you are African American.  Gender. Men are at higher risk than women before age 52. After age 52, women are at higher risk than men.  Having obstructive sleep apnea.  Stress. What are the signs or symptoms?  High blood pressure may not cause symptoms. Very high blood pressure (hypertensive crisis) may cause: ? Headache. ? Feelings of worry or nervousness (anxiety). ? Shortness of breath. ? Nosebleed. ? A feeling of being sick to your stomach (nausea). ? Throwing up (vomiting). ? Changes in how you see. ? Very bad chest pain. ? Seizures. How is this treated?  This condition is treated by making healthy lifestyle changes, such as: ? Eating healthy foods. ? Exercising more. ? Drinking less alcohol.  Your health care provider may prescribe medicine if lifestyle changes are not enough to get your blood pressure under control, and if: ? Your top number is above 130. ? Your bottom number is above 80.  Your personal target blood pressure may vary. Follow these instructions at home: Eating and drinking   If told, follow the DASH eating plan. To follow this plan: ? Fill one half of your plate at each meal with fruits and vegetables. ? Fill one fourth of your plate  at each meal with whole grains. Whole grains include whole-wheat pasta, brown rice, and whole-grain bread. ? Eat or drink low-fat dairy products, such as skim milk or low-fat yogurt. ? Fill one fourth of your plate at each meal with low-fat (lean) proteins. Low-fat proteins include fish, chicken without skin, eggs, beans, and tofu. ? Avoid fatty meat, cured and processed meat, or chicken with skin. ? Avoid pre-made or processed food.  Eat less than 1,500 mg of salt each day.  Do not drink alcohol if: ? Your doctor tells you not to drink. ? You are pregnant, may be pregnant, or are planning to become pregnant.  If you drink alcohol: ? Limit how much you use to:  0-1 drink a day for women.  0-2 drinks a day for men. ? Be aware of how much alcohol  is in your drink. In the [place] one drink equals one 12 oz bottle of beer (355 mL), one 5 oz glass of wine (148 mL), or one 1 oz glass of hard liquor (44 mL). Lifestyle   Work with your doctor to stay at a healthy weight or to lose weight. Ask your doctor what the best weight is for you.  Get at least 30 minutes of exercise most days of the week. This may include walking, swimming, or biking.  Get at least 30 minutes of exercise that strengthens your muscles (resistance exercise) at least 3 days a week. This may include lifting weights or doing Pilates.  Do not use any products that contain nicotine or tobacco, such as cigarettes, e-cigarettes, and chewing tobacco. If you need help quitting, ask your doctor.  Check your blood pressure at home as told by your doctor.  Keep all follow-up visits as told by your doctor. This is important. Medicines  Take over-the-counter and prescription medicines only as told by your doctor. Follow directions carefully.  Do not skip doses of blood pressure medicine. The medicine does not work as well if you skip doses. Skipping doses also puts you at risk for problems.  Ask your doctor about side effects or  reactions to medicines that you should watch for. Contact a doctor if you:  Think you are having a reaction to the medicine you are taking.  Have headaches that keep coming back (recurring).  Feel dizzy.  Have swelling in your ankles.  Have trouble with your vision. Get help right away if you:  Get a very bad headache.  Start to feel mixed up (confused).  Feel weak or numb.  Feel faint.  Have very bad pain in your: ? Chest. ? Belly (abdomen).  Throw up more than once.  Have trouble breathing. Summary  Hypertension is another name for high blood pressure.  High blood pressure forces your heart to work harder to pump blood.  For most people, a normal blood pressure is less than 120/80.  Making healthy choices can help lower blood pressure. If your blood pressure does not get lower with healthy choices, you may need to take medicine. This information is not intended to replace advice given to you by your health care provider. Make sure you discuss any questions you have with your health care provider. Document Revised: 02/14/2018 Document Reviewed: 02/14/2018 Elsevier Patient Education  2020 [employer].

## 2020-03-12 NOTE — Progress Notes (Signed)
Established Patient Office Visit  Subjective:  Patient ID: CARESS REFFITT, female    DOB: 1966-01-18  Age: 54 y.o. MRN: [id number]  CC:  Chief Complaint  Patient presents with  . Medical Management of Chronic Issues    HPI Braylon Lemmons Cai is a 54 year old patient, presents for Hyperlipidemia: Patient presents with hyperlipidemia.  She was tested because elevated total cholesterol.  Her last labs showed Total cholesterol of 217, HDL 55 LDL 135  Triglycerides 153. negative. There is not a family history of hyperlipidemia. There is not a family history of early ischemia heart disease.   Pt presents for follow up of hypertension. Patient was diagnosed in 2018. The patient is tolerating the medication well without side effects. Compliance with treatment has been good; including taking medication as directed , maintains a healthy diet and regular exercise regimen , and following up as directed.  Current medication lisinopril 20 mg daily.   Anxiety: Patient complains of anxiety disorder.  She has the following symptoms: none. Onset of symptoms was approximately 8 months ago, stable since that time. She denies current suicidal and homicidal ideation. Family history significant for no psychiatric illness.Possible organic causes contributing are: none. Risk factors: none Previous treatment includes Celexa and none.  She complains of the following side effects from the treatment: none.   Past Medical History:  Diagnosis Date  . Anxiety   . Chronic back pain   . Complication of anesthesia 1991 and 1998   childbirth --reaction to anesthesia was itching.   . DDD (degenerative disc disease)   . Depression   . Headache    migraines  . Hemorrhoids   . History of kidney stones   . Hyperlipidemia   . Hypertension   . PONV (postoperative nausea and vomiting)     Past Surgical History:  Procedure Laterality Date  . ADENOIDECTOMY  1978  . CESAREAN SECTION  1991 1998   pt had itching with 1991  c-section.   . COLONOSCOPY N/A 03/01/2013   Dr. Jena Gauss- normal rectum except for anal canal/internal hemorrhoidal tags. colonic mucosa appeared normal.  . CYSTOSCOPY W/ URETERAL STENT PLACEMENT Left 12/09/2019   Procedure: CYSTOSCOPY WITH RETROGRADE PYELOGRAM/URETERAL STENT PLACEMENT;  Surgeon: Bjorn Pippin, MD;  Location: [facility];  Service: Urology;  Laterality: Left;  . CYSTOSCOPY/URETEROSCOPY/HOLMIUM LASER/STENT PLACEMENT Left 12/17/2019   Procedure: LEFT URETEROSCOPY/STENT EXCHANGE/ EXTRACTION OF LEFT URETERAL CALCULI;  Surgeon: Bjorn Pippin, MD;  Location: [facility];  Service: Urology;  Laterality: Left;  . KNEE ARTHROSCOPY  2003  . LITHOTRIPSY    . LUMBAR LAMINECTOMY/DECOMPRESSION MICRODISCECTOMY  03/02/2012   Procedure: LUMBAR LAMINECTOMY/DECOMPRESSION MICRODISCECTOMY 1 LEVEL;  Surgeon: Carmela Hurt, MD;  Location: [facility];  Service: Neurosurgery;  Laterality: Left;  LEFT Lumbar Five-Sacral One Laminectomy for Decompression     Family History  Problem Relation Age of Onset  . Hypertension Mother   . Cancer Father   . Hypertension Father   . Colon cancer Neg Hx       Outpatient Medications Prior to Visit  Medication Sig Dispense Refill  . citalopram (CELEXA) 20 MG tablet Take 1 tablet (20 mg total) by mouth daily. 90 tablet 1  . desogestrel-ethinyl estradiol (VIORELE) 0.15-0.02/0.01 MG (21/5) tablet Take 1 tablet by mouth daily. 28 tablet 2  . lisinopril (ZESTRIL) 20 MG tablet TAKE ONE (1) TABLET EACH DAY 90 tablet 1  . Rimegepant Sulfate (NURTEC) 75 MG TBDP Take 75 mg by mouth every other day as needed. 30 tablet 2  .  simvastatin (ZOCOR) 20 MG tablet Take 1 tablet (20 mg total) by mouth every morning. 90 tablet 1   No facility-administered medications prior to visit.    No Known Allergies  ROS Review of Systems  Constitutional: Negative.   HENT: Negative.   Eyes: Negative.   Respiratory: Negative.   Gastrointestinal: Negative.   Genitourinary: Negative.     Musculoskeletal: Negative.   Skin: Negative.       Objective:    Physical Exam Vitals reviewed.  Constitutional:      Appearance: Normal appearance.  HENT:     Head: Normocephalic.     Nose: Nose normal.  Eyes:     Conjunctiva/sclera: Conjunctivae normal.  Cardiovascular:     Rate and Rhythm: Normal rate and regular rhythm.     Pulses: Normal pulses.     Heart sounds: Normal heart sounds.  Pulmonary:     Effort: Pulmonary effort is normal.     Breath sounds: Normal breath sounds.  Abdominal:     General: Bowel sounds are normal.  Musculoskeletal:        General: Normal range of motion.  Skin:    General: Skin is warm.  Neurological:     Mental Status: She is alert and oriented to person, place, and time.  Psychiatric:        Mood and Affect: Mood normal.        Behavior: Behavior normal.     There were no vitals taken for this visit. Wt Readings from Last 3 Encounters:  01/09/20 198 lb 9.6 oz (90.1 kg)  12/17/19 217 lb 6.5 oz (98.6 kg)  12/13/19 213 lb (96.6 kg)     Health Maintenance Due  Topic Date Due  . COVID-19 Vaccine (1) Never done  . TETANUS/TDAP  Never done  . PAP SMEAR-Modifier  Never done  . MAMMOGRAM  02/19/2018  . INFLUENZA VACCINE  Never done    There are no preventive care reminders to display for this patient.  No results found for: TSH Lab Results  Component Value Date   WBC 8.5 01/09/2020   HGB 13.6 01/09/2020   HCT 40.3 01/09/2020   MCV 92 01/09/2020   PLT 340 01/09/2020   Lab Results  Component Value Date   NA 139 12/13/2019   K 3.9 12/13/2019   CO2 26 12/13/2019   GLUCOSE 96 12/13/2019   BUN 12 12/13/2019   CREATININE 0.77 12/13/2019   BILITOT 1.5 (H) 12/09/2019   ALKPHOS 59 12/09/2019   AST 22 12/09/2019   ALT 18 12/09/2019   PROT 7.1 12/09/2019   ALBUMIN 3.1 (L) 12/09/2019   CALCIUM 9.7 12/13/2019   ANIONGAP 9 12/13/2019   Lab Results  Component Value Date   CHOL 217 (H) 01/09/2020   Lab Results  Component  Value Date   HDL 55 01/09/2020   Lab Results  Component Value Date   LDLCALC 135 (H) 01/09/2020   Lab Results  Component Value Date   TRIG 153 (H) 01/09/2020   Lab Results  Component Value Date   CHOLHDL 3.9 01/09/2020   No results found for: HGBA1C    Assessment & Plan:   Problem List Items Addressed This Visit      Cardiovascular and Mediastinum   Essential hypertension - Primary    Patient presents for follow-up of hypertension, patient was diagnosed in 2018 the patient is tolerating medication well without side effects.  Compliance with treatment has been good including taking medication as directed.  Maintains a  healthy diet and regular exercise regimen.  And follows up as directed.  Current medication lisinopril 20 mg daily.  No changes to medication dose. Provided education with printed handouts given.  Continue diet and exercise modification.      Relevant Medications   lisinopril (ZESTRIL) 20 MG tablet   simvastatin (ZOCOR) 20 MG tablet   Migraine without aura and without status migrainosus, not intractable   Relevant Medications   citalopram (CELEXA) 20 MG tablet   lisinopril (ZESTRIL) 20 MG tablet   Rimegepant Sulfate (NURTEC) 75 MG TBDP   simvastatin (ZOCOR) 20 MG tablet     Genitourinary   DUB (dysfunctional uterine bleeding)   Relevant Medications   desogestrel-ethinyl estradiol (VIORELE) 0.15-0.02/0.01 MG (21/5) tablet     Other   Pure hypercholesterolemia    Patient is following up with hyperlipidemia.  She was diagnosed in 2018.  Total cholesterol last 2 months is elevated  217, HDL 55, LDL 35 triglycerides 153.  Patient is not reporting any signs or symptoms of hyperlipidemia. Continue to educate patient on diet and exercise modification.  Provided printed handouts to patient Follow-up October for repeat labs      Relevant Medications   lisinopril (ZESTRIL) 20 MG tablet   simvastatin (ZOCOR) 20 MG tablet   Depression, major, single episode, mild  (HCC)   Relevant Medications   citalopram (CELEXA) 20 MG tablet   Anxiety    Patient is following up for anxiety disorder.  She was first diagnosed in 2021 about 8 months ago.  She is stable, she is taking medication as prescribed, current medication Celexa 10 mg daily.  Patient is not reporting any adverse effect from medication. Provided education to patient with printed handouts given. No changes to current medication dose Follow-up with worsening or unresolved symptoms.      Relevant Medications   citalopram (CELEXA) 20 MG tablet      Meds ordered this encounter  Medications  . citalopram (CELEXA) 20 MG tablet    Sig: Take 1 tablet (20 mg total) by mouth daily.    Dispense:  90 tablet    Refill:  1    Order Specific Question:   Supervising Provider    Answer:   Arville Care A [id number]  . desogestrel-ethinyl estradiol (VIORELE) 0.15-0.02/0.01 MG (21/5) tablet    Sig: Take 1 tablet by mouth daily.    Dispense:  28 tablet    Refill:  2    Order Specific Question:   Supervising Provider    Answer:   Arville Care A [id number]  . lisinopril (ZESTRIL) 20 MG tablet    Sig: TAKE ONE (1) TABLET EACH DAY    Dispense:  90 tablet    Refill:  1    Order Specific Question:   Supervising Provider    Answer:   Arville Care A [id number]  . Rimegepant Sulfate (NURTEC) 75 MG TBDP    Sig: Take 75 mg by mouth every other day as needed.    Dispense:  30 tablet    Refill:  2    Order Specific Question:   Supervising Provider    Answer:   Arville Care A [id number]  . simvastatin (ZOCOR) 20 MG tablet    Sig: Take 1 tablet (20 mg total) by mouth every morning.    Dispense:  90 tablet    Refill:  1    Order Specific Question:   Supervising Provider    Answer:   Arville Care A [id number]  Follow-up: No follow-ups on file.    Ivy Lynn, NP

## 2020-04-10 ENCOUNTER — Other Ambulatory Visit: Payer: Medicaid Other

## 2020-04-17 ENCOUNTER — Ambulatory Visit
Admission: RE | Admit: 2020-04-17 | Discharge: 2020-04-17 | Disposition: A | Discharge status: Home / Self Care | Payer: Self-pay | Source: Ambulatory Visit | Attending: Family Medicine | Admitting: Family Medicine

## 2020-04-17 ENCOUNTER — Other Ambulatory Visit: Payer: Medicaid Other

## 2020-04-17 ENCOUNTER — Other Ambulatory Visit: Payer: Self-pay | Admitting: Family Medicine

## 2020-04-17 ENCOUNTER — Other Ambulatory Visit: Payer: Self-pay

## 2020-04-17 DIAGNOSIS — E7800 Pure hypercholesterolemia, unspecified: Secondary | ICD-10-CM

## 2020-04-17 DIAGNOSIS — Z1231 Encounter for screening mammogram for malignant neoplasm of breast: Secondary | ICD-10-CM

## 2020-04-17 DIAGNOSIS — I10 Essential (primary) hypertension: Secondary | ICD-10-CM

## 2020-04-17 LAB — CBC WITH DIFFERENTIAL/PLATELET

## 2020-04-18 LAB — CBC WITH DIFFERENTIAL/PLATELET
Basophils Absolute: 0.1 10*3/uL (ref 0.0–0.2)
Basos: 1 %
EOS (ABSOLUTE): 0.1 10*3/uL (ref 0.0–0.4)
Eos: 1 %
Hematocrit: 41.1 % (ref 34.0–46.6)
Hemoglobin: 13.7 g/dL (ref 11.1–15.9)
Immature Grans (Abs): 0 10*3/uL (ref 0.0–0.1)
Immature Granulocytes: 0 %
Lymphocytes Absolute: 1.6 10*3/uL (ref 0.7–3.1)
Lymphs: 27 %
MCH: 30.9 pg (ref 26.6–33.0)
MCHC: 33.3 g/dL (ref 31.5–35.7)
MCV: 93 fL (ref 79–97)
Monocytes Absolute: 0.4 10*3/uL (ref 0.1–0.9)
Monocytes: 6 %
Neutrophils Absolute: 3.8 10*3/uL (ref 1.4–7.0)
Neutrophils: 65 %
Platelets: 286 10*3/uL (ref 150–450)
RBC: 4.43 x10E6/uL (ref 3.77–5.28)
RDW: 12.5 % (ref 11.7–15.4)
WBC: 5.9 10*3/uL (ref 3.4–10.8)

## 2020-04-18 LAB — CMP14+EGFR
ALT: 13 IU/L (ref 0–32)
AST: 16 IU/L (ref 0–40)
Albumin/Globulin Ratio: 1.3 (ref 1.2–2.2)
Albumin: 4.3 g/dL (ref 3.8–4.9)
Alkaline Phosphatase: 92 IU/L (ref 44–121)
BUN/Creatinine Ratio: 13 (ref 9–23)
BUN: 13 mg/dL (ref 6–24)
Bilirubin Total: 0.3 mg/dL (ref 0.0–1.2)
CO2: 21 mmol/L (ref 20–29)
Calcium: 10.2 mg/dL (ref 8.7–10.2)
Chloride: 102 mmol/L (ref 96–106)
Creatinine, Ser: 0.99 mg/dL (ref 0.57–1.00)
GFR calc Af Amer: 76 mL/min/{1.73_m2} (ref >59)
GFR calc non Af Amer: 66 mL/min/{1.73_m2} (ref >59)
Globulin, Total: 3.2 g/dL (ref 1.5–4.5)
Glucose: 80 mg/dL (ref 65–99)
Potassium: 4.3 mmol/L (ref 3.5–5.2)
Sodium: 138 mmol/L (ref 134–144)
Total Protein: 7.5 g/dL (ref 6.0–8.5)

## 2020-04-18 LAB — LIPID PANEL
Chol/HDL Ratio: 3.5 ratio (ref 0.0–4.4)
Cholesterol, Total: 234 mg/dL — ABNORMAL HIGH (ref 100–199)
HDL: 67 mg/dL (ref >39)
LDL Chol Calc (NIH): 150 mg/dL — ABNORMAL HIGH (ref 0–99)
Triglycerides: 95 mg/dL (ref 0–149)
VLDL Cholesterol Cal: 17 mg/dL (ref 5–40)

## 2020-05-07 ENCOUNTER — Inpatient Hospital Stay (HOSPITAL_COMMUNITY)
Admission: EM | Admit: 2020-05-07 | Discharge: 2020-05-09 | DRG: 854 | Disposition: A | Discharge status: Home / Self Care | Payer: Self-pay | Attending: Internal Medicine | Admitting: Internal Medicine

## 2020-05-07 ENCOUNTER — Inpatient Hospital Stay (HOSPITAL_COMMUNITY): Payer: Self-pay | Admitting: Anesthesiology

## 2020-05-07 ENCOUNTER — Other Ambulatory Visit: Payer: Self-pay

## 2020-05-07 ENCOUNTER — Emergency Department (HOSPITAL_COMMUNITY): Payer: Self-pay

## 2020-05-07 ENCOUNTER — Inpatient Hospital Stay (HOSPITAL_COMMUNITY): Payer: Self-pay

## 2020-05-07 ENCOUNTER — Encounter: Payer: Self-pay | Admitting: Internal Medicine

## 2020-05-07 ENCOUNTER — Encounter (HOSPITAL_COMMUNITY)
Admission: EM | Disposition: A | Discharge status: Home / Self Care | Payer: Self-pay | Source: Home / Self Care | Attending: Internal Medicine

## 2020-05-07 ENCOUNTER — Encounter (HOSPITAL_COMMUNITY): Payer: Self-pay | Admitting: *Deleted

## 2020-05-07 DIAGNOSIS — Z87891 Personal history of nicotine dependence: Secondary | ICD-10-CM

## 2020-05-07 DIAGNOSIS — N132 Hydronephrosis with renal and ureteral calculous obstruction: Secondary | ICD-10-CM

## 2020-05-07 DIAGNOSIS — R6520 Severe sepsis without septic shock: Secondary | ICD-10-CM

## 2020-05-07 DIAGNOSIS — N200 Calculus of kidney: Secondary | ICD-10-CM

## 2020-05-07 DIAGNOSIS — F32A Depression, unspecified: Secondary | ICD-10-CM | POA: Diagnosis present

## 2020-05-07 DIAGNOSIS — N136 Pyonephrosis: Secondary | ICD-10-CM | POA: Diagnosis present

## 2020-05-07 DIAGNOSIS — R000 Tachycardia, unspecified: Secondary | ICD-10-CM | POA: Diagnosis present

## 2020-05-07 DIAGNOSIS — N179 Acute kidney failure, unspecified: Secondary | ICD-10-CM | POA: Diagnosis present

## 2020-05-07 DIAGNOSIS — Z79899 Other long term (current) drug therapy: Secondary | ICD-10-CM

## 2020-05-07 DIAGNOSIS — Z8249 Family history of ischemic heart disease and other diseases of the circulatory system: Secondary | ICD-10-CM

## 2020-05-07 DIAGNOSIS — Z20822 Contact with and (suspected) exposure to covid-19: Secondary | ICD-10-CM | POA: Diagnosis present

## 2020-05-07 DIAGNOSIS — Z960 Presence of urogenital implants: Secondary | ICD-10-CM | POA: Diagnosis present

## 2020-05-07 DIAGNOSIS — E871 Hypo-osmolality and hyponatremia: Secondary | ICD-10-CM | POA: Diagnosis present

## 2020-05-07 DIAGNOSIS — A419 Sepsis, unspecified organism: Secondary | ICD-10-CM | POA: Diagnosis present

## 2020-05-07 DIAGNOSIS — E872 Acidosis: Secondary | ICD-10-CM | POA: Diagnosis present

## 2020-05-07 DIAGNOSIS — E785 Hyperlipidemia, unspecified: Secondary | ICD-10-CM | POA: Diagnosis present

## 2020-05-07 DIAGNOSIS — A4151 Sepsis due to Escherichia coli [E. coli]: Principal | ICD-10-CM | POA: Diagnosis present

## 2020-05-07 DIAGNOSIS — N390 Urinary tract infection, site not specified: Secondary | ICD-10-CM | POA: Diagnosis present

## 2020-05-07 DIAGNOSIS — N10 Acute tubulo-interstitial nephritis: Secondary | ICD-10-CM

## 2020-05-07 DIAGNOSIS — I10 Essential (primary) hypertension: Secondary | ICD-10-CM | POA: Diagnosis present

## 2020-05-07 DIAGNOSIS — Z6836 Body mass index (BMI) 36.0-36.9, adult: Secondary | ICD-10-CM

## 2020-05-07 DIAGNOSIS — F419 Anxiety disorder, unspecified: Secondary | ICD-10-CM | POA: Diagnosis present

## 2020-05-07 HISTORY — PX: CYSTOSCOPY W/ URETERAL STENT PLACEMENT: SHX1429

## 2020-05-07 HISTORY — DX: Acute pyelonephritis: N10

## 2020-05-07 HISTORY — DX: Calculus of kidney: N20.0

## 2020-05-07 LAB — CBC WITH DIFFERENTIAL/PLATELET
Band Neutrophils: 25 %
Basophils Absolute: 0 10*3/uL (ref 0.0–0.1)
Basophils Relative: 0 %
Eosinophils Absolute: 0 10*3/uL (ref 0.0–0.5)
Eosinophils Relative: 0 %
HCT: 37.3 % (ref 36.0–46.0)
Hemoglobin: 12.3 g/dL (ref 12.0–15.0)
Lymphocytes Relative: 5 %
Lymphs Abs: 0.9 10*3/uL (ref 0.7–4.0)
MCH: 31.4 pg (ref 26.0–34.0)
MCHC: 33 g/dL (ref 30.0–36.0)
MCV: 95.2 fL (ref 80.0–100.0)
Metamyelocytes Relative: 2 %
Monocytes Absolute: 0.5 10*3/uL (ref 0.1–1.0)
Monocytes Relative: 3 %
Neutro Abs: 15.3 10*3/uL — ABNORMAL HIGH (ref 1.7–7.7)
Neutrophils Relative %: 65 %
Platelets: 272 10*3/uL (ref 150–400)
RBC: 3.92 MIL/uL (ref 3.87–5.11)
RDW: 12.8 % (ref 11.5–15.5)
WBC Morphology: INCREASED
WBC: 17 10*3/uL — ABNORMAL HIGH (ref 4.0–10.5)
nRBC: 0 % (ref 0.0–0.2)

## 2020-05-07 LAB — URINALYSIS, ROUTINE W REFLEX MICROSCOPIC
Bilirubin Urine: NEGATIVE
Glucose, UA: NEGATIVE mg/dL
Ketones, ur: NEGATIVE mg/dL
Nitrite: NEGATIVE
Protein, ur: 100 mg/dL — AB
Specific Gravity, Urine: 1.013 (ref 1.005–1.030)
WBC, UA: >50 WBC/hpf — ABNORMAL HIGH (ref 0–5)
pH: 6 (ref 5.0–8.0)

## 2020-05-07 LAB — COMPREHENSIVE METABOLIC PANEL
ALT: 21 U/L (ref 0–44)
AST: 22 U/L (ref 15–41)
Albumin: 3.2 g/dL — ABNORMAL LOW (ref 3.5–5.0)
Alkaline Phosphatase: 78 U/L (ref 38–126)
Anion gap: 10 (ref 5–15)
BUN: 27 mg/dL — ABNORMAL HIGH (ref 6–20)
CO2: 22 mmol/L (ref 22–32)
Calcium: 9.5 mg/dL (ref 8.9–10.3)
Chloride: 99 mmol/L (ref 98–111)
Creatinine, Ser: 2.12 mg/dL — ABNORMAL HIGH (ref 0.44–1.00)
GFR, Estimated: 28 mL/min — ABNORMAL LOW (ref >60)
Glucose, Bld: 140 mg/dL — ABNORMAL HIGH (ref 70–99)
Potassium: 3.5 mmol/L (ref 3.5–5.1)
Sodium: 131 mmol/L — ABNORMAL LOW (ref 135–145)
Total Bilirubin: 1.8 mg/dL — ABNORMAL HIGH (ref 0.3–1.2)
Total Protein: 7.6 g/dL (ref 6.5–8.1)

## 2020-05-07 LAB — RESP PANEL BY RT-PCR (FLU A&B, COVID) ARPGX2
Influenza A by PCR: NEGATIVE
Influenza B by PCR: NEGATIVE
SARS Coronavirus 2 by RT PCR: NEGATIVE

## 2020-05-07 LAB — LACTIC ACID, PLASMA: Lactic Acid, Venous: 1.8 mmol/L (ref 0.5–1.9)

## 2020-05-07 SURGERY — CYSTOSCOPY, WITH RETROGRADE PYELOGRAM AND URETERAL STENT INSERTION
Anesthesia: General | Laterality: Right

## 2020-05-07 MED ORDER — SODIUM CHLORIDE 0.9 % IV BOLUS
1000.0000 mL | Freq: Once | INTRAVENOUS | Status: AC
  Administered 2020-05-07: 1000 mL via INTRAVENOUS

## 2020-05-07 MED ORDER — VANCOMYCIN HCL IN DEXTROSE 1-5 GM/200ML-% IV SOLN: 1000.0000 mg | INTRAVENOUS | Status: DC

## 2020-05-07 MED ORDER — DIATRIZOATE MEGLUMINE 30 % UR SOLN
URETHRAL | Status: AC
  Filled 2020-05-07: qty 100

## 2020-05-07 MED ORDER — ACETAMINOPHEN 325 MG PO TABS: 650.0000 mg | ORAL_TABLET | Freq: Four times a day (QID) | ORAL | Status: DC | PRN

## 2020-05-07 MED ORDER — DIATRIZOATE MEGLUMINE 30 % UR SOLN
URETHRAL | Status: DC | PRN
  Administered 2020-05-07: 20 mL

## 2020-05-07 MED ORDER — LACTATED RINGERS IV SOLN: Freq: Once | INTRAVENOUS | Status: AC

## 2020-05-07 MED ORDER — PROPOFOL 10 MG/ML IV BOLUS
INTRAVENOUS | Status: AC
  Filled 2020-05-07: qty 40

## 2020-05-07 MED ORDER — MEPERIDINE HCL 50 MG/ML IJ SOLN: 6.2500 mg | INTRAMUSCULAR | Status: DC | PRN

## 2020-05-07 MED ORDER — METOCLOPRAMIDE HCL 5 MG/ML IJ SOLN
INTRAMUSCULAR | Status: DC | PRN
  Administered 2020-05-07: 10 mg via INTRAVENOUS

## 2020-05-07 MED ORDER — SODIUM CHLORIDE 0.9 % IV SOLN: 2.0000 g | Freq: Two times a day (BID) | INTRAVENOUS | Status: DC

## 2020-05-07 MED ORDER — ONDANSETRON HCL 4 MG/2ML IJ SOLN
4.0000 mg | Freq: Once | INTRAMUSCULAR | Status: AC
  Administered 2020-05-07: 4 mg via INTRAVENOUS
  Filled 2020-05-07: qty 2

## 2020-05-07 MED ORDER — CHLORHEXIDINE GLUCONATE 0.12 % MT SOLN
15.0000 mL | Freq: Once | OROMUCOSAL | Status: DC
  Filled 2020-05-07: qty 15

## 2020-05-07 MED ORDER — HYDROMORPHONE HCL 1 MG/ML IJ SOLN: 0.2500 mg | INTRAMUSCULAR | Status: DC | PRN

## 2020-05-07 MED ORDER — LIDOCAINE 2% (20 MG/ML) 5 ML SYRINGE
INTRAMUSCULAR | Status: AC
  Filled 2020-05-07: qty 5

## 2020-05-07 MED ORDER — PROPOFOL 500 MG/50ML IV EMUL
INTRAVENOUS | Status: DC | PRN
  Administered 2020-05-07: 100 ug/kg/min via INTRAVENOUS

## 2020-05-07 MED ORDER — SODIUM CHLORIDE 0.9 % IV SOLN
2.0000 g | Freq: Once | INTRAVENOUS | Status: AC
  Administered 2020-05-07: 2 g via INTRAVENOUS
  Filled 2020-05-07: qty 2

## 2020-05-07 MED ORDER — FENTANYL CITRATE (PF) 100 MCG/2ML IJ SOLN
INTRAMUSCULAR | Status: AC
  Filled 2020-05-07: qty 4

## 2020-05-07 MED ORDER — ORAL CARE MOUTH RINSE: 15.0000 mL | Freq: Once | OROMUCOSAL | Status: DC

## 2020-05-07 MED ORDER — LACTATED RINGERS IV SOLN: INTRAVENOUS | Status: DC | PRN

## 2020-05-07 MED ORDER — METOCLOPRAMIDE HCL 5 MG/ML IJ SOLN
INTRAMUSCULAR | Status: AC
  Filled 2020-05-07: qty 2

## 2020-05-07 MED ORDER — ONDANSETRON HCL 4 MG/2ML IJ SOLN
INTRAMUSCULAR | Status: AC
  Filled 2020-05-07: qty 2

## 2020-05-07 MED ORDER — METRONIDAZOLE IN NACL 5-0.79 MG/ML-% IV SOLN
500.0000 mg | Freq: Once | INTRAVENOUS | Status: AC
  Administered 2020-05-07: 500 mg via INTRAVENOUS
  Filled 2020-05-07: qty 100

## 2020-05-07 MED ORDER — DEXAMETHASONE SODIUM PHOSPHATE 10 MG/ML IJ SOLN
INTRAMUSCULAR | Status: DC | PRN
  Administered 2020-05-07: 5 mg via INTRAVENOUS

## 2020-05-07 MED ORDER — PHENYLEPHRINE 40 MCG/ML (10ML) SYRINGE FOR IV PUSH (FOR BLOOD PRESSURE SUPPORT)
PREFILLED_SYRINGE | INTRAVENOUS | Status: DC | PRN
  Administered 2020-05-07: 80 ug via INTRAVENOUS

## 2020-05-07 MED ORDER — SODIUM CHLORIDE 0.9 % IR SOLN
Status: DC | PRN
  Administered 2020-05-07: 1000 mL

## 2020-05-07 MED ORDER — MIDAZOLAM HCL 2 MG/2ML IJ SOLN
INTRAMUSCULAR | Status: AC
  Filled 2020-05-07: qty 2

## 2020-05-07 MED ORDER — MORPHINE SULFATE (PF) 4 MG/ML IV SOLN
4.0000 mg | Freq: Once | INTRAVENOUS | Status: AC
  Administered 2020-05-07: 4 mg via INTRAVENOUS
  Filled 2020-05-07: qty 1

## 2020-05-07 MED ORDER — PROMETHAZINE HCL 25 MG/ML IJ SOLN: 6.2500 mg | INTRAMUSCULAR | Status: DC | PRN

## 2020-05-07 MED ORDER — PROPOFOL 10 MG/ML IV BOLUS
INTRAVENOUS | Status: DC | PRN
  Administered 2020-05-07: 20 mg via INTRAVENOUS

## 2020-05-07 MED ORDER — VANCOMYCIN HCL IN DEXTROSE 1-5 GM/200ML-% IV SOLN: 1000.0000 mg | Freq: Once | INTRAVENOUS | Status: DC

## 2020-05-07 MED ORDER — MIDAZOLAM HCL 5 MG/5ML IJ SOLN
INTRAMUSCULAR | Status: DC | PRN
  Administered 2020-05-07: 2 mg via INTRAVENOUS

## 2020-05-07 MED ORDER — ACETAMINOPHEN 650 MG RE SUPP: 650.0000 mg | Freq: Four times a day (QID) | RECTAL | Status: DC | PRN

## 2020-05-07 MED ORDER — VANCOMYCIN HCL 2000 MG/400ML IV SOLN
2000.0000 mg | Freq: Once | INTRAVENOUS | Status: AC
  Administered 2020-05-07: 2000 mg via INTRAVENOUS
  Filled 2020-05-07: qty 400

## 2020-05-07 MED ORDER — ONDANSETRON HCL 4 MG/2ML IJ SOLN: 4.0000 mg | Freq: Four times a day (QID) | INTRAMUSCULAR | Status: DC | PRN

## 2020-05-07 MED ORDER — SODIUM CHLORIDE 0.9 % IV SOLN
1.0000 g | INTRAVENOUS | Status: DC
  Administered 2020-05-08: 1 g via INTRAVENOUS
  Filled 2020-05-07: qty 10

## 2020-05-07 MED ORDER — ONDANSETRON HCL 4 MG PO TABS: 4.0000 mg | ORAL_TABLET | Freq: Four times a day (QID) | ORAL | Status: DC | PRN

## 2020-05-07 MED ORDER — FENTANYL CITRATE (PF) 100 MCG/2ML IJ SOLN
INTRAMUSCULAR | Status: DC | PRN
  Administered 2020-05-07: 100 ug via INTRAVENOUS

## 2020-05-07 SURGICAL SUPPLY — 24 items
BAG DRAIN URO TABLE W/ADPT NS (BAG) ×3 IMPLANT
BAG DRN 8 ADPR NS SKTRN CSTL (BAG) ×1
BAG HAMPER (MISCELLANEOUS) ×3 IMPLANT
CATH FOLEY 2WAY SLVR  5CC 16FR (CATHETERS) ×3
CATH FOLEY 2WAY SLVR 5CC 16FR (CATHETERS) IMPLANT
CATH URET 5FR 28IN CONE TIP (BALLOONS)
CATH URET 5FR 28IN OPEN ENDED (CATHETERS) IMPLANT
CATH URET 5FR 70CM CONE TIP (BALLOONS) IMPLANT
CLOTH BEACON ORANGE TIMEOUT ST (SAFETY) ×3 IMPLANT
GLOVE BIO SURGEON STRL SZ7.5 (GLOVE) ×3 IMPLANT
GLOVE BIOGEL M 6.5 STRL (GLOVE) ×2 IMPLANT
GLOVE BIOGEL PI IND STRL 7.0 (GLOVE) ×2 IMPLANT
GLOVE BIOGEL PI INDICATOR 7.0 (GLOVE) ×6
GOWN STRL REUS W/TWL LRG LVL3 (GOWN DISPOSABLE) ×3 IMPLANT
GUIDEWIRE ANG ZIPWIRE 038X150 (WIRE) IMPLANT
GUIDEWIRE STR DUAL SENSOR (WIRE) ×3 IMPLANT
IV NS IRRIG 3000ML ARTHROMATIC (IV SOLUTION) ×6 IMPLANT
KIT TURNOVER CYSTO (KITS) ×3 IMPLANT
MANIFOLD NEPTUNE II (INSTRUMENTS) ×6 IMPLANT
NS IRRIG 500ML POUR BTL (IV SOLUTION) ×3 IMPLANT
PACK CYSTO (CUSTOM PROCEDURE TRAY) ×3 IMPLANT
PAD ARMBOARD 7.5X6 YLW CONV (MISCELLANEOUS) ×3 IMPLANT
STENT URET 6FRX24 CONTOUR (STENTS) ×2 IMPLANT
TOWEL OR 17X24 6PK STRL BLUE (TOWEL DISPOSABLE) ×3 IMPLANT

## 2020-05-07 NOTE — Consult Note (Signed)
Consultation: Right ureteral stones, hydronephrosis and sepsis Requested by: Dr. Ross Marcus  History of Present Illness: Sandra Carroll is a 54 year old female who is a known stone former.  She has had some back pain and fevers and chills sensation over the past week or 2.  Yesterday she started to have more pain and nausea and vomiting. She noted urine odor. Today she had malaise and knew it was a kidney stone.  She came to the emergency department.  CT scan revealed several stones in the right mid ureter one measuring about 3 mm and one 4 mm with proximal hydroureteronephrosis and perinephric stranding.  Her white count was 17, urinalysis with few bacteria and WBCs, creatinine 2.12, temp of 100.7, heart rate 116, blood pressure 130/91.  She was brought to the OR urgently for right stent.  She reports continued pain and no stone passage this afternoon and evening.   She reports multiple stones and stents in the past.  Dr. Annabell Howells recently took for a left stent and ureteroscopy June 2021.  Past Medical History:  Diagnosis Date  . Kidney stone    Past Surgical History:  Procedure Laterality Date  . CHOLECYSTECTOMY      Home Medications:  Medications Prior to Admission  Medication Sig Dispense Refill Last Dose  . citalopram (CELEXA) 20 MG tablet Take 20 mg by mouth daily.     Marland Kitchen lisinopril (ZESTRIL) 20 MG tablet Take 20 mg by mouth daily.     . simvastatin (ZOCOR) 20 MG tablet Take 20 mg by mouth at bedtime.     Marland Kitchen VIORELE 0.15-0.02/0.01 MG (21/5) tablet Take 1 tablet by mouth daily.      Allergies: No Known Allergies  History reviewed. No pertinent family history. Social History:  reports that she has quit smoking. She has never used smokeless tobacco. She reports that she does not drink alcohol and does not use drugs.  ROS: A complete review of systems was performed.  All systems are negative except for pertinent findings as noted. Review of Systems  All other systems reviewed and are  negative.    Physical Exam:  Vital signs in last 24 hours: Temp:  [99.7 F (37.6 C)-100.7 F (38.2 C)] 100.7 F (38.2 C) (11/18 1856) Pulse Rate:  [110-122] 116 (11/18 1856) Resp:  [19-30] 22 (11/18 1856) BP: (86-140)/(46-104) 130/91 (11/18 1856) SpO2:  [90 %-99 %] 99 % (11/18 1856) Weight:  [90.3 kg] 90.3 kg (11/18 1144) General:  Alert and oriented, No acute distress but looks to be uncomfortable and clutching her right side HEENT: Normocephalic, atraumatic Cardiovascular: Regular rate and rhythm Lungs: Regular rate and effort Abdomen: Soft, nontender, nondistended, no abdominal masses Extremities: No edema Neurologic: Grossly intact  Laboratory Data:  Results for orders placed or performed during the hospital encounter of 05/07/20 (from the past 24 hour(s))  Lactic acid, plasma     Status: None   Collection Time: 05/07/20 12:21 PM  Result Value Ref Range   Lactic Acid, Venous 1.8 0.5 - 1.9 mmol/L  CBC with Differential/Platelet     Status: Abnormal   Collection Time: 05/07/20 12:33 PM  Result Value Ref Range   WBC 17.0 (H) 4.0 - 10.5 K/uL   RBC 3.92 3.87 - 5.11 MIL/uL   Hemoglobin 12.3 12.0 - 15.0 g/dL   HCT 39.7 36 - 46 %   MCV 95.2 80.0 - 100.0 fL   MCH 31.4 26.0 - 34.0 pg   MCHC 33.0 30.0 - 36.0 g/dL   RDW  12.8 11.5 - 15.5 %   Platelets 272 150 - 400 K/uL   nRBC 0.0 0.0 - 0.2 %   Neutrophils Relative % 65 %   Neutro Abs 15.3 (H) 1.7 - 7.7 K/uL   Band Neutrophils 25 %   Lymphocytes Relative 5 %   Lymphs Abs 0.9 0.7 - 4.0 K/uL   Monocytes Relative 3 %   Monocytes Absolute 0.5 0.1 - 1.0 K/uL   Eosinophils Relative 0 %   Eosinophils Absolute 0.0 0.0 - 0.5 K/uL   Basophils Relative 0 %   Basophils Absolute 0.0 0.0 - 0.1 K/uL   WBC Morphology INCREASED BANDS (>20% BANDS)    Metamyelocytes Relative 2 %   Dohle Bodies PRESENT   Comprehensive metabolic panel     Status: Abnormal   Collection Time: 05/07/20 12:33 PM  Result Value Ref Range   Sodium 131 (L) 135 -  145 mmol/L   Potassium 3.5 3.5 - 5.1 mmol/L   Chloride 99 98 - 111 mmol/L   CO2 22 22 - 32 mmol/L   Glucose, Bld 140 (H) 70 - 99 mg/dL   BUN 27 (H) 6 - 20 mg/dL   Creatinine, Ser 1.61 (H) 0.44 - 1.00 mg/dL   Calcium 9.5 8.9 - 09.6 mg/dL   Total Protein 7.6 6.5 - 8.1 g/dL   Albumin 3.2 (L) 3.5 - 5.0 g/dL   AST 22 15 - 41 U/L   ALT 21 0 - 44 U/L   Alkaline Phosphatase 78 38 - 126 U/L   Total Bilirubin 1.8 (H) 0.3 - 1.2 mg/dL   GFR, Estimated 28 (L) >60 mL/min   Anion gap 10 5 - 15  Urinalysis, Routine w reflex microscopic Urine, Clean Catch     Status: Abnormal   Collection Time: 05/07/20 12:33 PM  Result Value Ref Range   Color, Urine YELLOW YELLOW   APPearance CLOUDY (A) CLEAR   Specific Gravity, Urine 1.013 1.005 - 1.030   pH 6.0 5.0 - 8.0   Glucose, UA NEGATIVE NEGATIVE mg/dL   Hgb urine dipstick SMALL (A) NEGATIVE   Bilirubin Urine NEGATIVE NEGATIVE   Ketones, ur NEGATIVE NEGATIVE mg/dL   Protein, ur 045 (A) NEGATIVE mg/dL   Nitrite NEGATIVE NEGATIVE   Leukocytes,Ua LARGE (A) NEGATIVE   RBC / HPF 6-10 0 - 5 RBC/hpf   WBC, UA >50 (H) 0 - 5 WBC/hpf   Bacteria, UA FEW (A) NONE SEEN   Squamous Epithelial / LPF 0-5 0 - 5   WBC Clumps PRESENT   Resp Panel by RT-PCR (Flu A&B, Covid) Nasopharyngeal Swab     Status: None   Collection Time: 05/07/20 12:35 PM   Specimen: Nasopharyngeal Swab; Nasopharyngeal(NP) swabs in vial transport medium  Result Value Ref Range   SARS Coronavirus 2 by RT PCR NEGATIVE NEGATIVE   Influenza A by PCR NEGATIVE NEGATIVE   Influenza B by PCR NEGATIVE NEGATIVE  Blood culture (routine x 2)     Status: None (Preliminary result)   Collection Time: 05/07/20  1:15 PM   Specimen: BLOOD  Result Value Ref Range   Specimen Description BLOOD RIGHT ANTECUBITAL    Special Requests      BOTTLES DRAWN AEROBIC AND ANAEROBIC Blood Culture adequate volume Performed at [facility], [address], [place], [place] [address]    Culture PENDING    Report Status  PENDING   Blood culture (routine x 2)     Status: None (Preliminary result)   Collection Time: 05/07/20  1:15  PM   Specimen: BLOOD RIGHT HAND  Result Value Ref Range   Specimen Description BLOOD RIGHT HAND    Special Requests      BOTTLES DRAWN AEROBIC ONLY Blood Culture results may not be optimal due to an inadequate volume of blood received in culture bottles Performed at [facility], [address], [place], [place] [address]    Culture PENDING    Report Status PENDING    Recent Results (from the past 240 hour(s))  Resp Panel by RT-PCR (Flu A&B, Covid) Nasopharyngeal Swab     Status: None   Collection Time: 05/07/20 12:35 PM   Specimen: Nasopharyngeal Swab; Nasopharyngeal(NP) swabs in vial transport medium  Result Value Ref Range Status   SARS Coronavirus 2 by RT PCR NEGATIVE NEGATIVE Final   Influenza A by PCR NEGATIVE NEGATIVE Final   Influenza B by PCR NEGATIVE NEGATIVE Final    Comment: Performed at [facility], [address] [place], [place] [address]  Blood culture (routine x 2)     Status: None (Preliminary result)   Collection Time: 05/07/20  1:15 PM   Specimen: BLOOD  Result Value Ref Range Status   Specimen Description BLOOD RIGHT ANTECUBITAL  Final   Special Requests   Final    BOTTLES DRAWN AEROBIC AND ANAEROBIC Blood Culture adequate volume Performed at [facility], [address], [place], [place] [address]    Culture PENDING  Incomplete   Report Status PENDING  Incomplete  Blood culture (routine x 2)     Status: None (Preliminary result)   Collection Time: 05/07/20  1:15 PM   Specimen: BLOOD RIGHT HAND  Result Value Ref Range Status   Specimen Description BLOOD RIGHT HAND  Final   Special Requests   Final    BOTTLES DRAWN AEROBIC ONLY Blood Culture results may not be optimal due to an inadequate volume of blood received in culture bottles Performed at [facility], [address] [place], [place] [address]    Culture PENDING  Incomplete   Report Status  PENDING  Incomplete   Creatinine: Recent Labs    05/07/20 1233  CREATININE 2.12*    Impression/Assessment/plan:  I discussed with the patient the nature, potential benefits, risks and alternatives to cystoscopy, right retrograde pyelogram, right ureteral stent placement, including side effects of the proposed treatment, the likelihood of the patient achieving the goals of the procedure, and any potential problems that might occur during the procedure or recuperation.  We discussed the rationale for a staged procedure and the importance of following up to have the stent/stones removed. All questions answered. Patient elects to proceed.   Jerilee Field 05/07/2020, 7:10 PM

## 2020-05-07 NOTE — H&P (Signed)
History and Physical    Chrisann Melaragno VQQ:[id number] DOB: 09-19-1965 DOA: 05/07/2020  PCP: Pcp, No   Patient coming from: Home  I have personally briefly reviewed patient's old medical records in [facility]  Chief Complaint: right flank pain, vomiting  HPI: Sandra Carroll is a 54 y.o. female with medical history significant for kidney stones, hypertension depression and anxiety.  Patient's chart has been marked for potential merge. Patient presented to the ED with complaints of right flank pain started yesterday, patient reports 12 episodes of vomiting since yesterday.  She complains of generalized body aches.  She reports a cough that started today.  Patient tells me she this has happened several times, and she has required stents placed about 6 times.  Admission June this year for acute pyelonephritis due to obstructing stone.  ED Course: T-max 100.7, tachycardic to 122, tachypneic to 30, blood pressure systolic down to 43/32.  O2 sats greater than 90% on room air.  WBC 17.  Lactic acid 1.8.  UA with large leukocytes greater than 50 WBCs few bacteria.  Renal stone study shows moderate right hydronephrosis to the distal ureter where there is several calcified ureteral calculi measuring 3 mm and 4 mL, right-sided perinephric and periureteral stranding which could be from recently passed stone versus pyelonephritis. EDP talked to urologist on-call, Dr. Mena Goes, is currently being taken to the OR for stent placement.  Patient started on cefepime, metronidazole and vancomycin.  Hospitalist to admit for  Review of Systems: As per HPI all other systems reviewed and negative.  Past Medical History:  Diagnosis Date  . Kidney stone     Past Surgical History:  Procedure Laterality Date  . CHOLECYSTECTOMY       reports that she has quit smoking. She has never used smokeless tobacco. She reports that she does not drink alcohol and does not use drugs.  No Known Allergies  Family  history of hypertension in father and mother.  Prior to Admission medications   Medication Sig Start Date End Date Taking? Authorizing Provider  citalopram (CELEXA) 20 MG tablet Take 20 mg by mouth daily. 03/13/20  Yes [provider]  lisinopril (ZESTRIL) 20 MG tablet Take 20 mg by mouth daily. 03/13/20  Yes [provider]  simvastatin (ZOCOR) 20 MG tablet Take 20 mg by mouth at bedtime. 03/13/20  Yes [provider]  VIORELE 0.15-0.02/0.01 MG (21/5) tablet Take 1 tablet by mouth daily. 03/13/20  Yes [provider]    Physical Exam: Vitals:   05/07/20 1600 05/07/20 1645 05/07/20 1700 05/07/20 1730  BP: 114/68 (!) 114/91 127/70 (!) 131/57  Pulse: (!) 112  (!) 113 (!) 114  Resp: (!) 26 (!) 24 (!) 25 (!) 22  Temp:      TempSrc:      SpO2: 96%  91% 90%  Weight:      Height:        Constitutional: In mild- moderate painful distress, from flank pain. Vitals:   05/07/20 1600 05/07/20 1645 05/07/20 1700 05/07/20 1730  BP: 114/68 (!) 114/91 127/70 (!) 131/57  Pulse: (!) 112  (!) 113 (!) 114  Resp: (!) 26 (!) 24 (!) 25 (!) 22  Temp:      TempSrc:      SpO2: 96%  91% 90%  Weight:      Height:       Eyes: PERRL, lids and conjunctivae normal ENMT: Mucous membranes are moist.  Neck: normal, supple, no masses, no thyromegaly Respiratory:  Normal respiratory effort. No accessory muscle use.  Cardiovascular: Regular rate and rhythm,. No extremity edema. 2+ pedal pulses. No carotid bruits.  Abdomen: no tenderness, no masses palpated. No hepatosplenomegaly. Bowel sounds positive.  Musculoskeletal: no clubbing / cyanosis. No joint deformity upper and lower extremities. Good ROM, no contractures. Normal muscle tone.  Skin: no rashes, lesions, ulcers. No induration Neurologic: No apparent cranial abnormality, moving extremities spontaneously. Psychiatric: Normal judgment and insight. Alert and oriented x 3. Normal mood.   Labs on Admission: I have personally  reviewed following labs and imaging studies  CBC: Recent Labs  Lab 05/07/20 1233  WBC 17.0*  NEUTROABS 15.3*  HGB 12.3  HCT 37.3  MCV 95.2  PLT 272   Basic Metabolic Panel: Recent Labs  Lab 05/07/20 1233  NA 131*  K 3.5  CL 99  CO2 22  GLUCOSE 140*  BUN 27*  CREATININE 2.12*  CALCIUM 9.5   Liver Function Tests: Recent Labs  Lab 05/07/20 1233  AST 22  ALT 21  ALKPHOS 78  BILITOT 1.8*  PROT 7.6  ALBUMIN 3.2*   Urine analysis:    Component Value Date/Time   COLORURINE YELLOW 05/07/2020 1233   APPEARANCEUR CLOUDY (A) 05/07/2020 1233   LABSPEC 1.013 05/07/2020 1233   PHURINE 6.0 05/07/2020 1233   GLUCOSEU NEGATIVE 05/07/2020 1233   HGBUR SMALL (A) 05/07/2020 1233   BILIRUBINUR NEGATIVE 05/07/2020 1233   KETONESUR NEGATIVE 05/07/2020 1233   PROTEINUR 100 (A) 05/07/2020 1233   NITRITE NEGATIVE 05/07/2020 1233   LEUKOCYTESUR LARGE (A) 05/07/2020 1233    Radiological Exams on Admission: DG Chest Port 1 View  Result Date: 05/07/2020 CLINICAL DATA:  Shortness of breath, cough. EXAM: PORTABLE CHEST 1 VIEW COMPARISON:  Chest radiographs December 09, 2019. FINDINGS: The heart size and mediastinal contours are within normal limits. No consolidation. No visible pleural effusions or pneumothorax. No acute osseous abnormality. IMPRESSION: No active disease. Electronically Signed   By: Feliberto Harts MD   On: 05/07/2020 12:53   CT Renal Stone Study  Result Date: 05/07/2020 CLINICAL DATA:  Right-sided lower back pain EXAM: CT ABDOMEN AND PELVIS WITHOUT CONTRAST TECHNIQUE: Multidetector CT imaging of the abdomen and pelvis was performed following the standard protocol without IV contrast. COMPARISON:  None. FINDINGS: Lower chest: The visualized heart size within normal limits. No pericardial fluid/thickening. No hiatal hernia. The visualized portions of the lungs are clear. Hepatobiliary: Although limited due to the lack of intravenous contrast, normal in appearance without  gross focal abnormality. The patient is status post cholecystectomy. No biliary ductal dilation. Pancreas:  Unremarkable.  No surrounding inflammatory changes. Spleen: Normal in size. Although limited due to the lack of intravenous contrast, normal in appearance. Adrenals/Urinary Tract: Both adrenal glands appear normal. Moderate right pelvicaliectasis and ureterectasis is seen down the level of the distal ureter where there is several ureteral calculi 1 measuring 3 mm and 1 measuring 4 mm. There is right-sided perinephric and proximal periureteral stranding changes. There is a tiny punctate calcification in the posterior right bladder. There is also a a 3 mm calculus seen in the mid pole the right kidney. No left-sided renal or collecting system calculi are noted. Stomach/Bowel: The stomach, small bowel, and colon are normal in appearance. No inflammatory changes or obstructive findings. Scattered colonic diverticula are noted. Vascular/Lymphatic: There are no enlarged abdominal or pelvic lymph nodes. Scattered aortic atherosclerotic calcifications are seen without aneurysmal dilatation. Reproductive: The uterus and adnexa are unremarkable. Other: Bilateral fat containing inguinal hernias are  noted. Musculoskeletal: No acute or significant osseous findings. IMPRESSION: Moderate right hydronephrosis to the distal ureter where there is several calcified ureteral calculi measuring 3 mm and 4 mm. There is also punctate calcification in the posterior right bladder. Right-sided perinephric and periureteral stranding which could be from the recently passed stone versus pyelonephritis. Nonobstructing right renal calculus. Electronically Signed   By: Jonna Clark M.D.   On: 05/07/2020 16:09    EKG:  None.   Assessment/Plan Principal Problem:   Severe sepsis (HCC) Active Problems:   UTI (urinary tract infection)   Nephrolithiasis   AKI (acute kidney injury) (HCC)   HTN (hypertension)   Severe sepsis-febrile to  100.7, tachycardic to 122, tachypneic to 30, initial hypotension to 86/46 improved with fluids, leukocytosis of 17, acute kidney injury.  Normal lactic acid 1.8.  Source of infection urinary-pyelonephritis.  Chest x-ray unremarkable.  Covid and influenza test negative. - EDP talked to Dr. Mena Goes, patient being wheeled for surgery.  Plans for cystoscopy, right retrograde pyelogram, right ureteral stent placement. -Continue antibiotics -3 L bolus given, continue N/s 100cc/hr x 15hrs -BMP, CBC in the morning -Follow-up blood and urine cultures. -Remain n.p.o.  Acute pyelonephritis 2/2 nephrolithiasis-stone study showing moderate right hydronephrosis, right-sided perinephric and periureteral stranding-recently passed stone versus pyelonephritis.  Also ureteral calculi-3 mm and 4 mm.  Prior history of kidney stones requiring stents.  Prior urine and blood culture showed pansensitive E. coli. -Follow-up urine cultures -Continue IV ceftriaxone 1 g daily.  AKI, creatinine 2.12, baseline 0.7-0.9.  Likely due to sepsis. -IV fluids.  Hypertension-initial hypotension, now stable. -Hold home lisinopril   DVT prophylaxis: SCDs for now, pending surgical procedure Code Status: Full code Family Communication: None at bedside Disposition Plan: 1- 2 days Consults called: urology Admission status: Inpt, tele I certify that at the point of admission it is my clinical judgment that the patient will require inpatient hospital care spanning beyond 2 midnights from the point of admission due to high intensity of service, high risk for further deterioration and high frequency of surveillance required.    Onnie Boer MD [facility]  05/07/2020, 7:55 PM

## 2020-05-07 NOTE — Clinical Social Work Note (Signed)
Transition of Care [facility]) - Emergency Department Mini Assessment  Patient Details  Name: Sandra Carroll MRN: [id number] Date of Birth: Jun 08, 1966  Transition of Care [facility]) CM/SW Contact:    Ewing Schlein, LCSW Phone Number: 05/07/2020, 12:41 PM  Clinical Narrative: Patient is a 54 year old female who presented to the ED for emesis. Per chart review, patient does not have insurance or PCP listed. CSW spoke with patient and patient stated her PCP is [facility] in [place], [place]. CSW asked about referral to financial counselor. Patient declined referral at this time. TOC signing off.  ED Mini Assessment: What brought you to the Emergency Department? : Emesis Barriers to Discharge: ED Barriers Resolved Barrier interventions: Screened patient for financial assistance and PCP needs Means of departure: Car Interventions which prevented an admission or readmission: Other (must enter comment) (Screened for PCP and financial assistance)  Patient Contact and Communications Choice offered to / list presented to : NA  Admission diagnosis:  Emesis There are no problems to display for this patient.  PCP:  Pcp, No Pharmacy:  No Pharmacies Listed

## 2020-05-07 NOTE — Discharge Instructions (Signed)
Ureteral Stent Implantation, Care After This sheet gives you information about how to care for yourself after your procedure. Your health care provider may also give you more specific instructions. If you have problems or questions, contact your health care provider.  Be sure to follow-up with Dr. Annabell Howells to plan stent/stone removal.  You may be able to see him at our [facility] office, call the office at [phone]  What can I expect after the procedure? After the procedure, it is common to have:  Nausea.  Mild pain when you urinate. You may feel this pain in your lower back or lower abdomen. The pain should stop within a few minutes after you urinate. This may last for up to 1 week.  A small amount of blood in your urine for several days. Follow these instructions at home: Medicines  Take over-the-counter and prescription medicines only as told by your health care provider.  If you were prescribed an antibiotic medicine, take it as told by your health care provider. Do not stop taking the antibiotic even if you start to feel better.  Do not drive for 24 hours if you were given a sedative during your procedure.  Ask your health care provider if the medicine prescribed to you requires you to avoid driving or using heavy machinery. Activity  Rest as told by your health care provider.  Avoid sitting for a long time without moving. Get up to take short walks every 1-2 hours. This is important to improve blood flow and breathing. Ask for help if you feel weak or unsteady.  Return to your normal activities as told by your health care provider. Ask your health care provider what activities are safe for you. General instructions   Watch for any blood in your urine. Call your health care provider if the amount of blood in your urine increases.  If you have a catheter: ? Follow instructions from your health care provider about taking care of your catheter and collection bag. ? Do not take  baths, swim, or use a hot tub until your health care provider approves. Ask your health care provider if you may take showers. You may only be allowed to take sponge baths.  Drink enough fluid to keep your urine pale yellow.  Do not use any products that contain nicotine or tobacco, such as cigarettes, e-cigarettes, and chewing tobacco. These can delay healing after surgery. If you need help quitting, ask your health care provider.  Keep all follow-up visits as told by your health care provider. This is important. Contact a health care provider if:  You have pain that gets worse or does not get better with medicine, especially pain when you urinate.  You have difficulty urinating.  You feel nauseous or you vomit repeatedly during a period of more than 2 days after the procedure. Get help right away if:  Your urine is dark red or has blood clots in it.  You are leaking urine (have incontinence).  The end of the stent comes out of your urethra.  You cannot urinate.  You have sudden, sharp, or severe pain in your abdomen or lower back.  You have a fever.  You have swelling or pain in your legs.  You have difficulty breathing. Summary  After the procedure, it is common to have mild pain when you urinate that goes away within a few minutes after you urinate. This may last for up to 1 week.  Watch for any blood in your  urine. Call your health care provider if the amount of blood in your urine increases.  Take over-the-counter and prescription medicines only as told by your health care provider.  Drink enough fluid to keep your urine pale yellow. This information is not intended to replace advice given to you by your health care provider. Make sure you discuss any questions you have with your health care provider. Document Revised: 03/13/2018 Document Reviewed: 03/14/2018 Elsevier Patient Education  2020 [employer].

## 2020-05-07 NOTE — Transfer of Care (Signed)
Immediate Anesthesia Transfer of Care Note  Patient: Sandra Carroll  Procedure(s) Performed: CYSTOSCOPY WITH RETROGRADE PYELOGRAM/URETERAL STENT PLACEMENT (Right )  Patient Location: PACU  Anesthesia Type:General  Level of Consciousness: awake, alert , oriented and sedated  Airway & Oxygen Therapy: Patient Spontanous Breathing and Patient connected to nasal cannula oxygen  Post-op Assessment: Report given to RN, Post -op Vital signs reviewed and stable and Post -op Vital signs reviewed and unstable, Anesthesiologist notified  Post vital signs: Reviewed and stable  Last Vitals:  Vitals Value Taken Time  BP 86/61 05/07/20 2004  Temp 36.8 C 05/07/20 2000  Pulse 104 05/07/20 2004  Resp 20 05/07/20 2000  SpO2 97 % 05/07/20 2004    Last Pain:  Vitals:   05/07/20 2000  TempSrc:   PainSc: 0-No pain         Complications: No complications documented.

## 2020-05-07 NOTE — Op Note (Signed)
Preoperative diagnosis: Right ureteral stone, right hydronephrosis, sepsis Postoperative diagnosis: Same  Procedure: Cystoscopy with right retrograde pyelogram and right ureteral stent placement  Surgeon: Mena Goes  Anesthesia: General  Indication for procedure: Sandra Carroll is a 54 year old female who presented with pain nausea vomiting and was found to have a 3 mm and 4 mm right mid ureteral stones and hydronephrosis.  She also appears to have sepsis.  She is brought for urgent stent.  Findings: On cystoscopy there was no stone or foreign body in the bladder.  Right retrograde pyelogram-this outlined a single ureter single collecting system unit with a normal ureter and some narrowing at the mid ureter and then dilation proximally of the ureter and collecting system.  It was difficult to see a specific filling defect as there was a lot of bowel gas.  Description of procedure: After consent was obtained patient brought to the operating room.  After adequate anesthesia she was placed in lithotomy position and prepped and draped in the usual sterile fashion.  A timeout was performed to confirm the patient and procedure.  The cystoscope was passed per urethra and the bladder inspected.  The right ureteral orifice cannulated with a 6 [place] open-ended catheter and retrograde injection of contrast was performed.  A sensor wire was then advanced and coiled in the collecting system.  Over the wire 6 x 24 cm stent advanced.  The wire was removed with a good coil seen in the kidney under fluoroscopy and a good coil in the bladder cystoscopically.  The scope was removed and a 16 [place] Foley was placed to max drain the system overnight.  She was awakened and taken to the cover room in stable condition.  Complications: None  Blood loss: Minimal  Specimens: None  Drains: 6 x 24 cm right ureteral stent  Disposition: Patient stable to PACU.  Foley catheter can be removed tomorrow.  Follow-up message set  for follow-up [facility].

## 2020-05-07 NOTE — ED Provider Notes (Signed)
Patient signed out pending urinalysis, additional lab work, and CT scan.  See Dr. Pati Gallo note for complete details.  In brief, presented with nausea and vomiting with flank pain.  History of kidney stones as well as urosepsis related to infected stones.  She had soft blood pressures upon arrival but responded nicely to fluids.  She was also given broad-spectrum antibiotics of vancomycin, cefepime, and Flagyl.  4:29 PM I have reviewed her labs and imaging.  Urinalysis with greater than 50 white cells and many bacteria.  Culture pending.  White count 17.  CT scan is notable for multiple obstructing right-sided ureteral stones in the distal ureter with right-sided hydronephrosis and perinephric and ureteral stranding.  Patient continues to have ongoing pain.  Redose medication.  Will consult with urology as she will likely need intervention.  5:36 PM Spoke with Dr. Mena Goes, urology.  We will plan for likely stenting at any [facility].  Request n.p.o. and Covid testing which has resulted and is negative.  Will discuss with admitting hospitalist.  Physical Exam  BP 114/68   Pulse (!) 112   Temp 99.7 F (37.6 C) (Oral)   Resp (!) 26   Ht 1.575 m (5\' 2" )   Wt 90.3 kg   SpO2 96%   BMI 36.40 kg/m    ED Course/Procedures     Procedures CRITICAL CARE Performed by:   Total critical care time: 35 minutes  Critical care time was exclusive of separately billable procedures and treating other patients.  Critical care was necessary to treat or prevent imminent or life-threatening deterioration.  Critical care was time spent personally by me on the following activities: development of treatment plan with patient and/or surrogate as well as nursing, discussions with consultants, evaluation of patient's response to treatment, examination of patient, obtaining history from patient or surrogate, ordering and performing treatments and interventions, ordering and review of laboratory  studies, ordering and review of radiographic studies, pulse oximetry and re-evaluation of patient's condition.      Shon Baton, MD 05/07/20 [phone]

## 2020-05-07 NOTE — Progress Notes (Signed)
Pharmacy Antibiotic Note  Sandra Carroll is a 54 y.o. female admitted on 05/07/2020 with unknown source of infection.  Pharmacy has been consulted for Vancomycin and Cefepime dosing.  Plan: Vancomycin 2000 mg IV x 1 dose. Vancomycin 1000 mg IV every 24 hours. Cefepime 2000 mg IV every 12 hours.  Monitor labs, c/s, and vanco level as indicated.  Height: 5\' 2"  (157.5 cm) Weight: 90.3 kg (199 lb) IBW/kg (Calculated) : 50.1  Temp (24hrs), Avg:99.7 F (37.6 C), Min:99.7 F (37.6 C), Max:99.7 F (37.6 C)  Recent Labs  Lab 05/07/20 1233  WBC 17.0*  CREATININE 2.12*    Estimated Creatinine Clearance: 32.4 mL/min (A) (by C-G formula based on SCr of 2.12 mg/dL (H)).    No Known Allergies  Antimicrobials this admission: Vanco 11/18 >>  Cefepime 11/18 >>  Flagyl 11/18 x 1  Microbiology results: 11/18 BCx: pending  Thank you for allowing pharmacy to be a part of this patient's care.  12/18 05/07/2020 3:15 PM

## 2020-05-07 NOTE — ED Triage Notes (Signed)
Pt with emesis since yesterday,  12 times per pt since yesterday. Denies diarrhea.  Pain all over per pt.

## 2020-05-07 NOTE — ED Provider Notes (Signed)
[facility] EMERGENCY DEPARTMENT Provider Note   CSN: [id number] Arrival date & time: 05/07/20  1123     History Chief Complaint  Patient presents with  . Emesis    Sandra Carroll is a 54 y.o. female.  Patient complains of vomiting 12 times since yesterday and she complains of right flank pain but also hurting all over.  Patient does have a history of kidney stones  The history is provided by the patient and medical records. No language interpreter was used.  Emesis Severity:  Severe Timing:  Constant Quality:  Unable to specify Able to tolerate:  Liquids Progression:  Unchanged Chronicity:  New Recent urination:  Decreased Relieved by:  Nothing Worsened by:  Nothing Ineffective treatments:  None tried Associated symptoms: no abdominal pain, no cough, no diarrhea and no headaches        Past Medical History:  Diagnosis Date  . Kidney stone     There are no problems to display for this patient.   Past Surgical History:  Procedure Laterality Date  . CHOLECYSTECTOMY       OB History   No obstetric history on file.     History reviewed. No pertinent family history.  Social History   Tobacco Use  . Smoking status: Former Games developer  . Smokeless tobacco: Never Used  Substance Use Topics  . Alcohol use: Never  . Drug use: Never    Home Medications Prior to Admission medications   Medication Sig Start Date End Date Taking? Authorizing Provider  citalopram (CELEXA) 20 MG tablet Take 20 mg by mouth daily. 03/13/20  Yes [provider]  lisinopril (ZESTRIL) 20 MG tablet Take 20 mg by mouth daily. 03/13/20  Yes [provider]  simvastatin (ZOCOR) 20 MG tablet Take 20 mg by mouth at bedtime. 03/13/20  Yes [provider]  VIORELE 0.15-0.02/0.01 MG (21/5) tablet Take 1 tablet by mouth daily. 03/13/20  Yes [provider]    Allergies    Patient has no known allergies.  Review of Systems   Review of Systems  Constitutional:  Negative for appetite change and fatigue.  HENT: Negative for congestion, ear discharge and sinus pressure.   Eyes: Negative for discharge.  Respiratory: Negative for cough.   Cardiovascular: Negative for chest pain.  Gastrointestinal: Positive for vomiting. Negative for abdominal pain and diarrhea.  Genitourinary: Negative for frequency and hematuria.       Flank pain  Musculoskeletal: Negative for back pain.  Skin: Negative for rash.  Neurological: Negative for seizures and headaches.  Psychiatric/Behavioral: Negative for hallucinations.    Physical Exam Updated Vital Signs BP 121/72   Pulse (!) 110   Temp 99.7 F (37.6 C) (Oral)   Resp (!) 25   Ht 5\' 2"  (1.575 m)   Wt 90.3 kg   SpO2 95%   BMI 36.40 kg/m   Physical Exam Vitals and nursing note reviewed.  Constitutional:      Appearance: She is well-developed. She is obese. She is ill-appearing.  HENT:     Head: Normocephalic.     Mouth/Throat:     Mouth: Mucous membranes are moist.  Eyes:     General: No scleral icterus.    Conjunctiva/sclera: Conjunctivae normal.  Neck:     Thyroid: No thyromegaly.  Cardiovascular:     Rate and Rhythm: Normal rate and regular rhythm.     Heart sounds: No murmur heard.  No friction rub. No gallop.   Pulmonary:     Breath sounds:  No stridor. No wheezing or rales.  Chest:     Chest wall: No tenderness.  Abdominal:     General: There is no distension.     Tenderness: There is no abdominal tenderness. There is no rebound.  Genitourinary:    Comments: Tender right flank Musculoskeletal:        General: Normal range of motion.     Cervical back: Neck supple.  Lymphadenopathy:     Cervical: No cervical adenopathy.  Skin:    Findings: No erythema or rash.  Neurological:     Mental Status: She is alert and oriented to person, place, and time.     Motor: No abnormal muscle tone.     Coordination: Coordination normal.  Psychiatric:        Behavior: Behavior normal.     ED  Results / Procedures / Treatments   Labs (all labs ordered are listed, but only abnormal results are displayed) Labs Reviewed  CBC WITH DIFFERENTIAL/PLATELET - Abnormal; Notable for the following components:      Result Value   WBC 17.0 (*)    Neutro Abs 15.3 (*)    All other components within normal limits  COMPREHENSIVE METABOLIC PANEL - Abnormal; Notable for the following components:   Sodium 131 (*)    Glucose, Bld 140 (*)    BUN 27 (*)    Creatinine, Ser 2.12 (*)    Albumin 3.2 (*)    Total Bilirubin 1.8 (*)    GFR, Estimated 28 (*)    All other components within normal limits  CULTURE, BLOOD (ROUTINE X 2)  CULTURE, BLOOD (ROUTINE X 2)  RESP PANEL BY RT-PCR (FLU A&B, COVID) ARPGX2  URINALYSIS, ROUTINE W REFLEX MICROSCOPIC  LACTIC ACID, PLASMA    EKG None  Radiology DG Chest Port 1 View  Result Date: 05/07/2020 CLINICAL DATA:  Shortness of breath, cough. EXAM: PORTABLE CHEST 1 VIEW COMPARISON:  Chest radiographs December 09, 2019. FINDINGS: The heart size and mediastinal contours are within normal limits. No consolidation. No visible pleural effusions or pneumothorax. No acute osseous abnormality. IMPRESSION: No active disease. Electronically Signed   By: Feliberto Harts MD   On: 05/07/2020 12:53    Procedures Procedures (including critical care time)  Medications Ordered in ED Medications  ceFEPIme (MAXIPIME) 2 g in sodium chloride 0.9 % 100 mL IVPB (has no administration in time range)  metroNIDAZOLE (FLAGYL) IVPB 500 mg (has no administration in time range)  vancomycin (VANCOCIN) IVPB 1000 mg/200 mL premix (has no administration in time range)  sodium chloride 0.9 % bolus 1,000 mL (has no administration in time range)  sodium chloride 0.9 % bolus 1,000 mL (1,000 mLs Intravenous New Bag/Given 05/07/20 1245)  ondansetron (ZOFRAN) injection 4 mg (4 mg Intravenous Given 05/07/20 1245)  sodium chloride 0.9 % bolus 1,000 mL (1,000 mLs Intravenous New Bag/Given 05/07/20  1431)    ED Course  I have reviewed the triage vital signs and the nursing notes.  Pertinent labs & imaging results that were available during my care of the patient were reviewed by me and considered in my medical decision making (see chart for details).    MDM Rules/Calculators/A&P                          Patient with persistent vomiting and flank pain.  CT scan pending Final Clinical Impression(s) / ED Diagnoses Final diagnoses:  None    Rx / DC Orders ED Discharge Orders  None       Bethann Berkshire, MD 05/11/20 [phone]

## 2020-05-07 NOTE — Anesthesia Preprocedure Evaluation (Signed)
Anesthesia Evaluation  Patient identified by MRN, date of birth, ID band Patient awake    Reviewed: Allergy & Precautions, NPO status , Patient's Chart, lab work & pertinent test results  History of Anesthesia Complications Negative for: history of anesthetic complications  Airway Mallampati: II  TM Distance: >3 FB Neck ROM: Full    Dental no notable dental hx. (+) Dental Advisory Given, Teeth Intact   Pulmonary former smoker,    Pulmonary exam normal breath sounds clear to auscultation       Cardiovascular Exercise Tolerance: Good hypertension, Pt. on medications  Rhythm:Regular Rate:Tachycardia     Neuro/Psych negative neurological ROS  negative psych ROS   GI/Hepatic negative GI ROS, Neg liver ROS,   Endo/Other  negative endocrine ROS  Renal/GU Renal InsufficiencyRenal disease (AKI)  negative genitourinary   Musculoskeletal negative musculoskeletal ROS (+)   Abdominal   Peds  Hematology negative hematology ROS (+)   Anesthesia Other Findings   Reproductive/Obstetrics negative OB ROS                             Anesthesia Physical Anesthesia Plan  ASA: III and emergent  Anesthesia Plan: General   Post-op Pain Management:    Induction: Intravenous  PONV Risk Score and Plan:   Airway Management Planned: Nasal Cannula, Natural Airway and Simple Face Mask  Additional Equipment:   Intra-op Plan:   Post-operative Plan:   Informed Consent: I have reviewed the patients History and Physical, chart, labs and discussed the procedure including the risks, benefits and alternatives for the proposed anesthesia with the patient or authorized representative who has indicated his/her understanding and acceptance.     Dental advisory given  Plan Discussed with: CRNA and Surgeon  Anesthesia Plan Comments:         Anesthesia Quick Evaluation

## 2020-05-08 LAB — BASIC METABOLIC PANEL
Anion gap: 9 (ref 5–15)
BUN: 25 mg/dL — ABNORMAL HIGH (ref 6–20)
CO2: 19 mmol/L — ABNORMAL LOW (ref 22–32)
Calcium: 9.2 mg/dL (ref 8.9–10.3)
Chloride: 109 mmol/L (ref 98–111)
Creatinine, Ser: 1.55 mg/dL — ABNORMAL HIGH (ref 0.44–1.00)
GFR, Estimated: 40 mL/min — ABNORMAL LOW (ref >60)
Glucose, Bld: 144 mg/dL — ABNORMAL HIGH (ref 70–99)
Potassium: 3.7 mmol/L (ref 3.5–5.1)
Sodium: 137 mmol/L (ref 135–145)

## 2020-05-08 LAB — CBC
HCT: 30.4 % — ABNORMAL LOW (ref 36.0–46.0)
Hemoglobin: 9.9 g/dL — ABNORMAL LOW (ref 12.0–15.0)
MCH: 31.3 pg (ref 26.0–34.0)
MCHC: 32.6 g/dL (ref 30.0–36.0)
MCV: 96.2 fL (ref 80.0–100.0)
Platelets: 226 10*3/uL (ref 150–400)
RBC: 3.16 MIL/uL — ABNORMAL LOW (ref 3.87–5.11)
RDW: 12.9 % (ref 11.5–15.5)
WBC: 13.7 10*3/uL — ABNORMAL HIGH (ref 4.0–10.5)
nRBC: 0 % (ref 0.0–0.2)

## 2020-05-08 LAB — HIV ANTIBODY (ROUTINE TESTING W REFLEX): HIV Screen 4th Generation wRfx: NONREACTIVE

## 2020-05-08 MED ORDER — SIMVASTATIN 20 MG PO TABS
20.0000 mg | ORAL_TABLET | Freq: Every day | ORAL | Status: DC
  Administered 2020-05-08: 20 mg via ORAL
  Filled 2020-05-08: qty 1

## 2020-05-08 MED ORDER — KETOROLAC TROMETHAMINE 15 MG/ML IJ SOLN
15.0000 mg | Freq: Once | INTRAMUSCULAR | Status: AC
  Administered 2020-05-08: 15 mg via INTRAVENOUS
  Filled 2020-05-08: qty 1

## 2020-05-08 MED ORDER — SODIUM CHLORIDE 0.9 % IV SOLN
2.0000 g | INTRAVENOUS | Status: DC
  Administered 2020-05-09: 2 g via INTRAVENOUS
  Filled 2020-05-08: qty 20

## 2020-05-08 MED ORDER — CITALOPRAM HYDROBROMIDE 20 MG PO TABS
20.0000 mg | ORAL_TABLET | Freq: Every day | ORAL | Status: DC
  Administered 2020-05-08 – 2020-05-09 (×2): 20 mg via ORAL
  Filled 2020-05-08 (×2): qty 1

## 2020-05-08 MED ORDER — BUTALBITAL-APAP-CAFFEINE 50-325-40 MG PO TABS
1.0000 | ORAL_TABLET | ORAL | Status: DC | PRN
  Administered 2020-05-08 – 2020-05-09 (×2): 1 via ORAL
  Filled 2020-05-08 (×3): qty 1

## 2020-05-08 MED ORDER — HYDROCODONE-ACETAMINOPHEN 5-325 MG PO TABS
1.0000 | ORAL_TABLET | ORAL | Status: DC | PRN
  Administered 2020-05-08 – 2020-05-09 (×4): 1 via ORAL
  Filled 2020-05-08 (×5): qty 1

## 2020-05-08 MED ORDER — LACTATED RINGERS IV SOLN: INTRAVENOUS | Status: DC

## 2020-05-08 MED ORDER — DESOGESTREL-ETHINYL ESTRADIOL 0.15-0.02/0.01 MG (21/5) PO TABS: 1.0000 | ORAL_TABLET | Freq: Every day | ORAL | Status: DC

## 2020-05-08 MED ORDER — SODIUM CHLORIDE 0.9 % IV SOLN
1.0000 g | Freq: Once | INTRAVENOUS | Status: AC
  Administered 2020-05-08: 1 g via INTRAVENOUS
  Filled 2020-05-08: qty 10

## 2020-05-08 MED ORDER — CHLORHEXIDINE GLUCONATE CLOTH 2 % EX PADS: 6.0000 | MEDICATED_PAD | Freq: Every day | CUTANEOUS | Status: DC

## 2020-05-08 NOTE — Anesthesia Postprocedure Evaluation (Signed)
Anesthesia Post Note  Patient: Sandra Carroll  Procedure(s) Performed: CYSTOSCOPY WITH RETROGRADE PYELOGRAM/URETERAL STENT PLACEMENT (Right )  Patient location during evaluation: Nursing Unit Anesthesia Type: General Level of consciousness: awake and alert and oriented Pain management: pain level controlled Vital Signs Assessment: post-procedure vital signs reviewed and stable Respiratory status: spontaneous breathing and respiratory function stable Cardiovascular status: blood pressure returned to baseline Postop Assessment: no headache and no apparent nausea or vomiting Anesthetic complications: no   No complications documented.   Last Vitals:  Vitals:   05/08/20 0251 05/08/20 0449  BP: 109/77 110/65  Pulse: 79 76  Resp:  18  Temp:  36.6 C  SpO2:  99%    Last Pain:  Vitals:   05/08/20 0300  TempSrc:   PainSc: 8                  Conner Muegge C Aniken Monestime

## 2020-05-08 NOTE — TOC Progression Note (Signed)
Transition of Care [facility]) - Progression Note    Patient Details  Name: Sandra Carroll MRN: [id number] Date of Birth: 1965-08-06  Transition of Care [facility]) CM/SW Contact  Elliot Gault, LCSW Phone Number: 05/08/2020, 10:33 AM  Clinical Narrative:     TOC following. Pt status discussed with MD in Progression today. Per MD, pt will receive one more day of IV antibiotics and hospital care with anticipated dc home tomorrow. MD states pt will dc on oral Keflex which is on the $4 list at [facility]. Updated MD that pt does not have insurance so if another medication is needed at dc, TOC can check on cost and pt's ability to afford.  Weekend TOC will be available if needed.    Barriers to Discharge: Continued Medical Work up  Expected Discharge Plan and Services                                                 Social Determinants of Health (SDOH) Interventions    Readmission Risk Interventions No flowsheet data found.

## 2020-05-08 NOTE — Progress Notes (Signed)
PROGRESS NOTE    Sandra Carroll  XAJ:[id number] DOB: 02-28-1966 DOA: 05/07/2020 PCP: Pcp, No   Chief Complaint  Patient presents with  . Emesis    Brief Narrative: 54 year old female with history of recurrent kidney stone has had a stent placed about 6 times in the past, hypertension, depression, anxiety presented to the ED with right flank pain and 2 episodes of vomiting since 11/17. She was seen in the ED noted to be febrile tachycardic hypotensive 86/46 and leukocytosis, meeting severe sepsis criteria POA, given IV fluid boluses IV antibiotics urine was consulted underwent right ureteral stent placement, and hospitalist was requested to admit the patient.  Subjective: C/o rt flank pain- but overall feels better, no nausea since last night after nausea meds- no vomiting, chest pain Foley draining well no blood. tmax 100.7 in ED   Assessment & Plan:  Severe sepsis with Acute pyelonephritis with right ureteral stone, right hydronephrosis: Status post emergent cystoscopy with right retrograde pyelogram and right ureteral stent placement 11/18.  Sepsis parameters resolved.  WC count improving, vitals stable. Please avoid for further urology plan if no further plan will start diet.  Continue IV fluid hydration IV ceftriaxone-and will dose at 2 g daily,previously pansensitive E. Coli. Continue pain control add hydrocodone p.o.  Nephrolithiasis requiring stent placement.  AKI creat down from 2.12 now at 1.5, keep on gentle IV fluid hydration.  Metabolic acidosis bicarb at 19: Continue fluid hydration,  Hyponatremia-resolved  HTN: Hypotensive on admission, currently blood pressure stable.  Continue to hold home lisinopril.  Hyperlipidemia on simvastatin.  Morbid obesity with BMI 36.4.  She will benefit with weight loss and healthy lifestyle LPC follow-up.  Nutrition: Diet Order            Diet NPO time specified  Diet effective now                 Body mass index is 36.4  kg/m. a DVT prophylaxis: SCDs Start: 05/07/20 2106 Code Status:   Code Status: Full Code  Family Communication: plan of care discussed with patient at bedside.  Status is: Inpatient  Remains inpatient appropriate because:IV treatments appropriate due to intensity of illness or inability to take PO and Inpatient level of care appropriate due to severity of illness   Dispo: The patient is from: Home              Anticipated d/c is to: Home              Anticipated d/c date is: 2 days              Patient currently is not medically stable to d/c.   Consultants:see note  Procedures:see note  Culture/Microbiology    Component Value Date/Time   SDES BLOOD RIGHT ANTECUBITAL 05/07/2020 1315   SDES BLOOD RIGHT HAND 05/07/2020 1315   SPECREQUEST  05/07/2020 1315    BOTTLES DRAWN AEROBIC AND ANAEROBIC Blood Culture adequate volume   SPECREQUEST  05/07/2020 1315    BOTTLES DRAWN AEROBIC ONLY Blood Culture results may not be optimal due to an inadequate volume of blood received in culture bottles   CULT  05/07/2020 1315    NO GROWTH < 24 HOURS Performed at [facility], [address] [place], [place] [address]    CULT  05/07/2020 1315    NO GROWTH < 24 HOURS Performed at [facility], [address], [place], [place] [address]    REPTSTATUS PENDING 05/07/2020 1315   REPTSTATUS PENDING 05/07/2020 1315  Other culture-see note  Medications: Scheduled Meds: . chlorhexidine  15 mL Mouth/Throat Once   Or  . mouth rinse  15 mL Mouth Rinse Once  . Chlorhexidine Gluconate Cloth  6 each Topical Daily   Continuous Infusions: . cefTRIAXone (ROCEPHIN)  IV 1 g (05/08/20 0429)  . vancomycin 200 mL/hr at 05/07/20 2120    Antimicrobials: Anti-infectives (From admission, onward)   Start     Dose/Rate Route Frequency Ordered Stop   05/08/20 1700  vancomycin (VANCOCIN) IVPB 1000 mg/200 mL premix        1,000 mg 200 mL/hr over 60 Minutes Intravenous Every 24 hours 05/07/20 1514      05/08/20 0500  ceFEPIme (MAXIPIME) 2 g in sodium chloride 0.9 % 100 mL IVPB  Status:  Discontinued        2 g 200 mL/hr over 30 Minutes Intravenous Every 12 hours 05/07/20 1453 05/07/20 2351   05/08/20 0500  cefTRIAXone (ROCEPHIN) 1 g in sodium chloride 0.9 % 100 mL IVPB        1 g 200 mL/hr over 30 Minutes Intravenous Every 24 hours 05/07/20 2351     05/07/20 1530  vancomycin (VANCOREADY) IVPB 2000 mg/400 mL        2,000 mg 200 mL/hr over 120 Minutes Intravenous  Once 05/07/20 1451 05/07/20 1927   05/07/20 1445  ceFEPIme (MAXIPIME) 2 g in sodium chloride 0.9 % 100 mL IVPB        2 g 200 mL/hr over 30 Minutes Intravenous  Once 05/07/20 1443 05/07/20 1520   05/07/20 1445  metroNIDAZOLE (FLAGYL) IVPB 500 mg        500 mg 100 mL/hr over 60 Minutes Intravenous  Once 05/07/20 1443 05/07/20 1624   05/07/20 1445  vancomycin (VANCOCIN) IVPB 1000 mg/200 mL premix  Status:  Discontinued        1,000 mg 200 mL/hr over 60 Minutes Intravenous  Once 05/07/20 1443 05/07/20 1451     Objective: Vitals: Today's Vitals   05/08/20 0114 05/08/20 0251 05/08/20 0300 05/08/20 0449  BP: 99/67 109/77  110/65  Pulse: 80 79  76  Resp: 18   18  Temp: 98.1 F (36.7 C)   97.8 F (36.6 C)  TempSrc:      SpO2: 94%   99%  Weight:      Height:      PainSc:   8      Intake/Output Summary (Last 24 hours) at 05/08/2020 0823 Last data filed at 05/08/2020 0500 Gross per 24 hour  Intake 4681.85 ml  Output 2150 ml  Net 2531.85 ml   Filed Weights   05/07/20 1144  Weight: 90.3 kg   Weight change:   Intake/Output from previous day: 11/18 0701 - 11/19 0700 In: 4681.9 [P.O.:200; I.V.:2000; IV Piggyback:2481.9] Out: 2150 [Urine:2150] Intake/Output this shift: No intake/output data recorded.  Examination: General exam: AAOx3 ,NAD, weak appearing. HEENT:Oral mucosa moist, Ear/Nose WNL grossly,dentition normal. Respiratory system: bilaterally clear,no wheezing or crackles,no use of accessory muscle, non  tender. Cardiovascular system: S1 & S2 +, regular, No JVD. Gastrointestinal system: Abdomen soft, NT,ND, BS+, Rt flank pain . Nervous System:Alert, awake, moving extremities and grossly nonfocal Extremities: No edema, distal peripheral pulses palpable.  Skin: No rashes,no icterus. MSK: Normal muscle bulk,tone, power  Data Reviewed: I have personally reviewed following labs and imaging studies CBC: Recent Labs  Lab 05/07/20 1233 05/08/20 0441  WBC 17.0* 13.7*  NEUTROABS 15.3*  --   HGB 12.3 9.9*  HCT 37.3 30.4*  MCV 95.2 96.2  PLT 272 226   Basic Metabolic Panel: Recent Labs  Lab 05/07/20 1233 05/08/20 0441  NA 131* 137  K 3.5 3.7  CL 99 109  CO2 22 19*  GLUCOSE 140* 144*  BUN 27* 25*  CREATININE 2.12* 1.55*  CALCIUM 9.5 9.2   GFR: Estimated Creatinine Clearance: 44.4 mL/min (A) (by C-G formula based on SCr of 1.55 mg/dL (H)). Liver Function Tests: Recent Labs  Lab 05/07/20 1233  AST 22  ALT 21  ALKPHOS 78  BILITOT 1.8*  PROT 7.6  ALBUMIN 3.2*   No results for input(s): LIPASE, AMYLASE in the last 168 hours. No results for input(s): AMMONIA in the last 168 hours. Coagulation Profile: No results for input(s): INR, PROTIME in the last 168 hours. Cardiac Enzymes: No results for input(s): CKTOTAL, CKMB, CKMBINDEX, TROPONINI in the last 168 hours. BNP (last 3 results) No results for input(s): PROBNP in the last 8760 hours. HbA1C: No results for input(s): HGBA1C in the last 72 hours. CBG: No results for input(s): GLUCAP in the last 168 hours. Lipid Profile: No results for input(s): CHOL, HDL, LDLCALC, TRIG, CHOLHDL, LDLDIRECT in the last 72 hours. Thyroid Function Tests: No results for input(s): TSH, T4TOTAL, FREET4, T3FREE, THYROIDAB in the last 72 hours. Anemia Panel: No results for input(s): VITAMINB12, FOLATE, FERRITIN, TIBC, IRON, RETICCTPCT in the last 72 hours. Sepsis Labs: Recent Labs  Lab 05/07/20 1221  LATICACIDVEN 1.8    Recent Results  (from the past 240 hour(s))  Resp Panel by RT-PCR (Flu A&B, Covid) Nasopharyngeal Swab     Status: None   Collection Time: 05/07/20 12:35 PM   Specimen: Nasopharyngeal Swab; Nasopharyngeal(NP) swabs in vial transport medium  Result Value Ref Range Status   SARS Coronavirus 2 by RT PCR NEGATIVE NEGATIVE Final   Influenza A by PCR NEGATIVE NEGATIVE Final   Influenza B by PCR NEGATIVE NEGATIVE Final    Comment: Performed at [facility], [address], [place], [place] [address]  Blood culture (routine x 2)     Status: None (Preliminary result)   Collection Time: 05/07/20  1:15 PM   Specimen: BLOOD  Result Value Ref Range Status   Specimen Description BLOOD RIGHT ANTECUBITAL  Final   Special Requests   Final    BOTTLES DRAWN AEROBIC AND ANAEROBIC Blood Culture adequate volume   Culture   Final    NO GROWTH < 24 HOURS Performed at [facility], [address], [place], [place] [address]    Report Status PENDING  Incomplete  Blood culture (routine x 2)     Status: None (Preliminary result)   Collection Time: 05/07/20  1:15 PM   Specimen: BLOOD RIGHT HAND  Result Value Ref Range Status   Specimen Description BLOOD RIGHT HAND  Final   Special Requests   Final    BOTTLES DRAWN AEROBIC ONLY Blood Culture results may not be optimal due to an inadequate volume of blood received in culture bottles   Culture   Final    NO GROWTH < 24 HOURS Performed at [facility], [address], [place], [place] [address]    Report Status PENDING  Incomplete     Radiology Studies: DG Chest Port 1 View  Result Date: 05/07/2020 CLINICAL DATA:  Shortness of breath, cough. EXAM: PORTABLE CHEST 1 VIEW COMPARISON:  Chest radiographs December 09, 2019. FINDINGS: The heart size and mediastinal contours are within normal limits. No consolidation. No visible pleural effusions or pneumothorax. No acute osseous abnormality.  IMPRESSION: No active disease. Electronically Signed   By: Feliberto HartsFrederick S Jones MD   On: 05/07/2020  12:53   DG C-Arm 1-60 Min-No Report  Result Date: 05/07/2020 Fluoroscopy was utilized by the requesting physician.  No radiographic interpretation.   CT Renal Stone Study  Result Date: 05/07/2020 CLINICAL DATA:  Right-sided lower back pain EXAM: CT ABDOMEN AND PELVIS WITHOUT CONTRAST TECHNIQUE: Multidetector CT imaging of the abdomen and pelvis was performed following the standard protocol without IV contrast. COMPARISON:  None. FINDINGS: Lower chest: The visualized heart size within normal limits. No pericardial fluid/thickening. No hiatal hernia. The visualized portions of the lungs are clear. Hepatobiliary: Although limited due to the lack of intravenous contrast, normal in appearance without gross focal abnormality. The patient is status post cholecystectomy. No biliary ductal dilation. Pancreas:  Unremarkable.  No surrounding inflammatory changes. Spleen: Normal in size. Although limited due to the lack of intravenous contrast, normal in appearance. Adrenals/Urinary Tract: Both adrenal glands appear normal. Moderate right pelvicaliectasis and ureterectasis is seen down the level of the distal ureter where there is several ureteral calculi 1 measuring 3 mm and 1 measuring 4 mm. There is right-sided perinephric and proximal periureteral stranding changes. There is a tiny punctate calcification in the posterior right bladder. There is also a a 3 mm calculus seen in the mid pole the right kidney. No left-sided renal or collecting system calculi are noted. Stomach/Bowel: The stomach, small bowel, and colon are normal in appearance. No inflammatory changes or obstructive findings. Scattered colonic diverticula are noted. Vascular/Lymphatic: There are no enlarged abdominal or pelvic lymph nodes. Scattered aortic atherosclerotic calcifications are seen without aneurysmal dilatation. Reproductive: The uterus and adnexa are unremarkable. Other: Bilateral fat containing inguinal hernias are noted.  Musculoskeletal: No acute or significant osseous findings. IMPRESSION: Moderate right hydronephrosis to the distal ureter where there is several calcified ureteral calculi measuring 3 mm and 4 mm. There is also punctate calcification in the posterior right bladder. Right-sided perinephric and periureteral stranding which could be from the recently passed stone versus pyelonephritis. Nonobstructing right renal calculus. Electronically Signed   By: Jonna ClarkBindu  Avutu M.D.   On: 05/07/2020 16:09     LOS: 1 day   Lanae Boastamesh Itzelle Gains, MD [facility]  05/08/2020, 8:23 AM

## 2020-05-09 LAB — CBC
HCT: 28.5 % — ABNORMAL LOW (ref 36.0–46.0)
Hemoglobin: 9.2 g/dL — ABNORMAL LOW (ref 12.0–15.0)
MCH: 30.9 pg (ref 26.0–34.0)
MCHC: 32.3 g/dL (ref 30.0–36.0)
MCV: 95.6 fL (ref 80.0–100.0)
Platelets: 234 10*3/uL (ref 150–400)
RBC: 2.98 MIL/uL — ABNORMAL LOW (ref 3.87–5.11)
RDW: 13.1 % (ref 11.5–15.5)
WBC: 10.2 10*3/uL (ref 4.0–10.5)
nRBC: 0 % (ref 0.0–0.2)

## 2020-05-09 LAB — COMPREHENSIVE METABOLIC PANEL
ALT: 14 U/L (ref 0–44)
AST: 12 U/L — ABNORMAL LOW (ref 15–41)
Albumin: 2.3 g/dL — ABNORMAL LOW (ref 3.5–5.0)
Alkaline Phosphatase: 60 U/L (ref 38–126)
Anion gap: 6 (ref 5–15)
BUN: 19 mg/dL (ref 6–20)
CO2: 23 mmol/L (ref 22–32)
Calcium: 9.1 mg/dL (ref 8.9–10.3)
Chloride: 108 mmol/L (ref 98–111)
Creatinine, Ser: 1.28 mg/dL — ABNORMAL HIGH (ref 0.44–1.00)
GFR, Estimated: 50 mL/min — ABNORMAL LOW (ref >60)
Glucose, Bld: 108 mg/dL — ABNORMAL HIGH (ref 70–99)
Potassium: 3.5 mmol/L (ref 3.5–5.1)
Sodium: 137 mmol/L (ref 135–145)
Total Bilirubin: 0.4 mg/dL (ref 0.3–1.2)
Total Protein: 6 g/dL — ABNORMAL LOW (ref 6.5–8.1)

## 2020-05-09 LAB — BLOOD CULTURE ID PANEL (REFLEXED) - BCID2

## 2020-05-09 LAB — URINE CULTURE: Culture: >=100000 — AB

## 2020-05-09 MED ORDER — LISINOPRIL 20 MG PO TABS
20.0000 mg | ORAL_TABLET | Freq: Every day | ORAL | 0 refills | Status: DC
Start: 2020-05-13 — End: 2020-07-14

## 2020-05-09 MED ORDER — CEPHALEXIN 500 MG PO CAPS: 500.0000 mg | ORAL_CAPSULE | Freq: Four times a day (QID) | ORAL | 0 refills | Status: AC

## 2020-05-09 NOTE — Progress Notes (Signed)
Pt's only complaint today is of migraine headache. Pt has been medicated x2 this shift for same. No c/o n/v, denies any difficulty with urination or any back/abd pain. Tolerating po without difficulty.

## 2020-05-09 NOTE — Discharge Summary (Signed)
Physician Discharge Summary  Sandra Carroll ZOX:[id number] DOB: 05-07-1966 DOA: 05/07/2020  PCP: Oneita Hurt, No  Admit date: 05/07/2020 Discharge date: 05/09/2020  Admitted From: home Disposition:  home  Recommendations for Outpatient Follow-up:  1. Follow up with PCP in 1-2 weeks 2. Please obtain BMP/CBC in one week 3. Please follow up on the following pending results:  Home Health:no  Equipment/Devices: none  Discharge Condition: Stable Code Status:   Code Status: Full Code Diet recommendation:  Diet Order            Diet regular Room service appropriate? Yes; Fluid consistency: Thin  Diet effective now           Diet - low sodium heart healthy                  Brief/Interim Summary: 54 year old female with history of recurrent kidney stone has had a stent placed about 6 times in the past, hypertension, depression, anxiety presented to the ED with right flank pain and 2 episodes of vomiting since 11/17. She was seen in the ED noted to be febrile tachycardic hypotensive 86/46 and leukocytosis, meeting severe sepsis criteria POA, given IV fluid boluses IV antibiotics urine was consulted underwent right ureteral stent placement, and hospitalist was requested to admit the patient. Patient was admitted blood CX BCID w/ E coli and urine culture came back E. coli pansensitive. Discussed with Dr. Berneice Heinrich from urology and he has advised okay to discharge home on oral Keflex and urology will follow up as outpatient for stent and further stone management.  Discharge Diagnoses:  Assessment & Plan: Severe E coli sepsis with Acute pyelonephritis with right ureteral stone, right hydronephrosis: Severe Sepsis POA, underwent emergent cystoscopy with right retrograde pyelogram and right ureteral stent placement 11/18.  Hemodynamically stable, leukocytosis improved, afebrile 24 hrs and BP stable.Blood culture BCID panel-E. Coli, urine with pansensitive E. coli.  Discussed with Dr. Berneice Heinrich from urology and  urology arrange for outpatient follow-up and okay for to discharge home on Keflex for next 8 days.  Sent Keflex to [facility] pharmacy. He is off Foley and voiding well.  Nephrolithiasis:Need outpatient follow-up with urology and is very aware.She has a recurrent issues.  WJX:BJYNW improved to 1.5 from 2.1,improved to 1.2, continue IV diuresis, follow-up labs.   Recent Labs  Lab 05/07/20 1233 05/08/20 0441 05/09/20 0719  BUN 27* 25* 19  CREATININE 2.12* 1.55* 1.28*  Metabolic acidosis resolved Hyponatremia-resolved HTN: Hypotensive on admission, currently blood pressure stable.  We will continue to hold her home lisinopril and okay for at least for 5 more days and have her follow-up with her PCP.  IF Blood pressure poorly controlled at home can consider going back on it.   Hyperlipidemia cont statins Morbid obesity with BMI 36.4.  She will benefit with weight loss and healthy lifestyle LPC follow-up.  Consults:  Urology.   Subjective: Alert awake,tolerating regular diet,no fever.Ambulating well,voiding well.Would like to go home.  Discharge Exam: Vitals:   05/08/20 2106 05/09/20 0449  BP: 117/80 123/78  Pulse: (!) 109 97  Resp: 19 19  Temp: 98.1 F (36.7 C) 98.1 F (36.7 C)  SpO2: 95% 96%   General: Pt is alert, awake, not in acute distress Cardiovascular: RRR, S1/S2 +, no rubs, no gallops Respiratory: CTA bilaterally, no wheezing, no rhonchi Abdominal: Soft, NT, ND, bowel sounds + Extremities: no edema, no cyanosis  Discharge Instructions  Discharge Instructions    Diet - low sodium heart healthy   Complete by: As directed  Discharge instructions   Complete by: As directed    Please follow-up with Dr. Mena Goes from urology regarding your stent and stone management  Hold your lisinopril for for 5 days and monitor blood pressure at home.  Please call call MD or return to ER for similar or worsening recurring problem that brought you to hospital or if any  fever,nausea/vomiting,abdominal pain, uncontrolled pain, chest pain,  shortness of breath or any other alarming symptoms.  Please follow-up your doctor as instructed in a week time and call the office for appointment.  Please avoid alcohol, smoking, or any other illicit substance and maintain healthy habits including taking your regular medications as prescribed.  You were cared for by a hospitalist during your hospital stay. If you have any questions about your discharge medications or the care you received while you were in the hospital after you are discharged, you can call the unit and ask to speak with the hospitalist on call if the hospitalist that took care of you is not available.  Once you are discharged, your primary care physician will handle any further medical issues. Please note that NO REFILLS for any discharge medications will be authorized once you are discharged, as it is imperative that you return to your primary care physician (or establish a relationship with a primary care physician if you do not have one) for your aftercare needs so that they can reassess your need for medications and monitor your lab values   Increase activity slowly   Complete by: As directed    No wound care   Complete by: As directed      Allergies as of 05/09/2020   No Known Allergies     Medication List    TAKE these medications   cephALEXin 500 MG capsule Commonly known as: KEFLEX Take 1 capsule (500 mg total) by mouth 4 (four) times daily for 8 days.   citalopram 20 MG tablet Commonly known as: CELEXA Take 20 mg by mouth daily.   lisinopril 20 MG tablet Commonly known as: ZESTRIL Take 1 tablet (20 mg total) by mouth daily. Start taking on: May 13, 2020 What changed: These instructions start on May 13, 2020. If you are unsure what to do until then, ask your doctor or other care provider.   simvastatin 20 MG tablet Commonly known as: ZOCOR Take 20 mg by mouth at bedtime.    Viorele 0.15-0.02/0.01 MG (21/5) tablet Generic drug: desogestrel-ethinyl estradiol Take 1 tablet by mouth daily.       Follow-up Information    Call Bjorn Pippin, MD.   Specialty: Urology Contact information: [address] [place] [address] [phone]              No Known Allergies  The results of significant diagnostics from this hospitalization (including imaging, microbiology, ancillary and laboratory) are listed below for reference.    Microbiology: Recent Results (from the past 240 hour(s))  Resp Panel by RT-PCR (Flu A&B, Covid) Nasopharyngeal Swab     Status: None   Collection Time: 05/07/20 12:35 PM   Specimen: Nasopharyngeal Swab; Nasopharyngeal(NP) swabs in vial transport medium  Result Value Ref Range Status   SARS Coronavirus 2 by RT PCR NEGATIVE NEGATIVE Final   Influenza A by PCR NEGATIVE NEGATIVE Final   Influenza B by PCR NEGATIVE NEGATIVE Final    Comment: Performed at [facility], [address], [place], [place] [address]  Blood culture (routine x 2)     Status: None (  Preliminary result)   Collection Time: 05/07/20  1:15 PM   Specimen: BLOOD  Result Value Ref Range Status   Specimen Description   Final    BLOOD RIGHT ANTECUBITAL Performed at [facility], [address] [place], [place] [address]    Special Requests   Final    BOTTLES DRAWN AEROBIC AND ANAEROBIC Blood Culture adequate volume Performed at [facility], [address] [place], [place] [address]    Culture  Setup Time   Final    GRAM NEGATIVE COCCOBACILLI Gram Stain Report Called to,Read Back By and Verified With: JOHNSON,B @ 0143 ON 05/09/20 BY JUW ANAEROBIC BOTTLE ONLY GS DONE @ [facility] Organism ID to follow CRITICAL RESULT CALLED TO, READ BACK BY AND VERIFIED WITH: A MCKINNEY RN 05/09/20 0981 JDW Performed at [facility], [address] [place], [place] [address]    Culture   Final    NO GROWTH 2 DAYS Performed at [facility], [address]  [place], [place] [address]    Report Status PENDING  Incomplete  Blood culture (routine x 2)     Status: None (Preliminary result)   Collection Time: 05/07/20  1:15 PM   Specimen: BLOOD RIGHT HAND  Result Value Ref Range Status   Specimen Description BLOOD RIGHT HAND  Final   Special Requests   Final    BOTTLES DRAWN AEROBIC ONLY Blood Culture results may not be optimal due to an inadequate volume of blood received in culture bottles   Culture   Final    NO GROWTH 2 DAYS Performed at [facility], [address], [place], [place] [address]    Report Status PENDING  Incomplete  Blood Culture ID Panel (Reflexed)     Status: Abnormal   Collection Time: 05/07/20  1:15 PM  Result Value Ref Range Status   Enterococcus faecalis NOT DETECTED NOT DETECTED Final   Enterococcus Faecium NOT DETECTED NOT DETECTED Final   Listeria monocytogenes NOT DETECTED NOT DETECTED Final   Staphylococcus species NOT DETECTED NOT DETECTED Final   Staphylococcus aureus (BCID) NOT DETECTED NOT DETECTED Final   Staphylococcus epidermidis NOT DETECTED NOT DETECTED Final   Staphylococcus lugdunensis NOT DETECTED NOT DETECTED Final   Streptococcus species NOT DETECTED NOT DETECTED Final   Streptococcus agalactiae NOT DETECTED NOT DETECTED Final   Streptococcus pneumoniae NOT DETECTED NOT DETECTED Final   Streptococcus pyogenes NOT DETECTED NOT DETECTED Final   A.calcoaceticus-baumannii NOT DETECTED NOT DETECTED Final   Bacteroides fragilis NOT DETECTED NOT DETECTED Final   Enterobacterales DETECTED (A) NOT DETECTED Final    Comment: Enterobacterales represent a large order of gram negative bacteria, not a single organism. CRITICAL RESULT CALLED TO, READ BACK BY AND VERIFIED WITH: A MCKINNEY RN 05/09/20 0625 JDW    Enterobacter cloacae complex NOT DETECTED NOT DETECTED Final   Escherichia coli DETECTED (A) NOT DETECTED Final    Comment: CRITICAL RESULT CALLED TO, READ BACK BY AND VERIFIED WITH: A MCKINNEY RN 05/09/20  0625 JDW    Klebsiella aerogenes NOT DETECTED NOT DETECTED Final   Klebsiella oxytoca NOT DETECTED NOT DETECTED Final   Klebsiella pneumoniae NOT DETECTED NOT DETECTED Final   Proteus species NOT DETECTED NOT DETECTED Final   Salmonella species NOT DETECTED NOT DETECTED Final   Serratia marcescens NOT DETECTED NOT DETECTED Final   Haemophilus influenzae NOT DETECTED NOT DETECTED Final   Neisseria meningitidis NOT DETECTED NOT DETECTED Final   Pseudomonas aeruginosa NOT DETECTED NOT DETECTED Final   Stenotrophomonas maltophilia  NOT DETECTED NOT DETECTED Final   Candida albicans NOT DETECTED NOT DETECTED Final   Candida auris NOT DETECTED NOT DETECTED Final   Candida glabrata NOT DETECTED NOT DETECTED Final   Candida krusei NOT DETECTED NOT DETECTED Final   Candida parapsilosis NOT DETECTED NOT DETECTED Final   Candida tropicalis NOT DETECTED NOT DETECTED Final   Cryptococcus neoformans/gattii NOT DETECTED NOT DETECTED Final   CTX-M ESBL NOT DETECTED NOT DETECTED Final   Carbapenem resistance IMP NOT DETECTED NOT DETECTED Final   Carbapenem resistance KPC NOT DETECTED NOT DETECTED Final   Carbapenem resistance NDM NOT DETECTED NOT DETECTED Final   Carbapenem resist OXA 48 LIKE NOT DETECTED NOT DETECTED Final   Carbapenem resistance VIM NOT DETECTED NOT DETECTED Final    Comment: Performed at [facility], [address] [place], [place] [address]  Urine culture     Status: Abnormal   Collection Time: 05/07/20  3:23 PM   Specimen: Urine, Catheterized  Result Value Ref Range Status   Specimen Description   Final    URINE, CATHETERIZED Performed at [facility], [address] [place], [place] [address]    Special Requests   Final    NONE Performed at [facility], [address] [place], [place] [address]    Culture >=100,000 COLONIES/mL ESCHERICHIA COLI (A)  Final   Report Status 05/09/2020 FINAL  Final   Organism ID, Bacteria ESCHERICHIA COLI (A)  Final       Susceptibility   Escherichia coli - MIC*    AMPICILLIN <=2 SENSITIVE Sensitive     CEFAZOLIN <=4 SENSITIVE Sensitive     CEFEPIME <=0.12 SENSITIVE Sensitive     CEFTRIAXONE <=0.25 SENSITIVE Sensitive     CIPROFLOXACIN <=0.25 SENSITIVE Sensitive     GENTAMICIN <=1 SENSITIVE Sensitive     IMIPENEM <=0.25 SENSITIVE Sensitive     NITROFURANTOIN <=16 SENSITIVE Sensitive     TRIMETH/SULFA <=20 SENSITIVE Sensitive     AMPICILLIN/SULBACTAM <=2 SENSITIVE Sensitive     PIP/TAZO <=4 SENSITIVE Sensitive     * >=100,000 COLONIES/mL ESCHERICHIA COLI    Procedures/Studies: DG Chest Port 1 View  Result Date: 05/07/2020 CLINICAL DATA:  Shortness of breath, cough. EXAM: PORTABLE CHEST 1 VIEW COMPARISON:  Chest radiographs December 09, 2019. FINDINGS: The heart size and mediastinal contours are within normal limits. No consolidation. No visible pleural effusions or pneumothorax. No acute osseous abnormality. IMPRESSION: No active disease. Electronically Signed   By: Feliberto HartsFrederick S Jones MD   On: 05/07/2020 12:53   DG C-Arm 1-60 Min-No Report  Result Date: 05/07/2020 Fluoroscopy was utilized by the requesting physician.  No radiographic interpretation.   CT Renal Stone Study  Result Date: 05/07/2020 CLINICAL DATA:  Right-sided lower back pain EXAM: CT ABDOMEN AND PELVIS WITHOUT CONTRAST TECHNIQUE: Multidetector CT imaging of the abdomen and pelvis was performed following the standard protocol without IV contrast. COMPARISON:  None. FINDINGS: Lower chest: The visualized heart size within normal limits. No pericardial fluid/thickening. No hiatal hernia. The visualized portions of the lungs are clear. Hepatobiliary: Although limited due to the lack of intravenous contrast, normal in appearance without gross focal abnormality. The patient is status post cholecystectomy. No biliary ductal dilation. Pancreas:  Unremarkable.  No surrounding inflammatory changes. Spleen: Normal in size. Although limited due to the lack of  intravenous contrast, normal in appearance. Adrenals/Urinary Tract: Both adrenal glands appear normal. Moderate right pelvicaliectasis and ureterectasis is seen down the level of the distal ureter where there is several ureteral  calculi 1 measuring 3 mm and 1 measuring 4 mm. There is right-sided perinephric and proximal periureteral stranding changes. There is a tiny punctate calcification in the posterior right bladder. There is also a a 3 mm calculus seen in the mid pole the right kidney. No left-sided renal or collecting system calculi are noted. Stomach/Bowel: The stomach, small bowel, and colon are normal in appearance. No inflammatory changes or obstructive findings. Scattered colonic diverticula are noted. Vascular/Lymphatic: There are no enlarged abdominal or pelvic lymph nodes. Scattered aortic atherosclerotic calcifications are seen without aneurysmal dilatation. Reproductive: The uterus and adnexa are unremarkable. Other: Bilateral fat containing inguinal hernias are noted. Musculoskeletal: No acute or significant osseous findings. IMPRESSION: Moderate right hydronephrosis to the distal ureter where there is several calcified ureteral calculi measuring 3 mm and 4 mm. There is also punctate calcification in the posterior right bladder. Right-sided perinephric and periureteral stranding which could be from the recently passed stone versus pyelonephritis. Nonobstructing right renal calculus. Electronically Signed   By: Jonna Clark M.D.   On: 05/07/2020 16:09    Labs: BNP (last 3 results) No results for input(s): BNP in the last 8760 hours. Basic Metabolic Panel: Recent Labs  Lab 05/07/20 1233 05/08/20 0441 05/09/20 0719  NA 131* 137 137  K 3.5 3.7 3.5  CL 99 109 108  CO2 22 19* 23  GLUCOSE 140* 144* 108*  BUN 27* 25* 19  CREATININE 2.12* 1.55* 1.28*  CALCIUM 9.5 9.2 9.1   Liver Function Tests: Recent Labs  Lab 05/07/20 1233 05/09/20 0719  AST 22 12*  ALT 21 14  ALKPHOS 78 60   BILITOT 1.8* 0.4  PROT 7.6 6.0*  ALBUMIN 3.2* 2.3*   No results for input(s): LIPASE, AMYLASE in the last 168 hours. No results for input(s): AMMONIA in the last 168 hours. CBC: Recent Labs  Lab 05/07/20 1233 05/08/20 0441 05/09/20 0719  WBC 17.0* 13.7* 10.2  NEUTROABS 15.3*  --   --   HGB 12.3 9.9* 9.2*  HCT 37.3 30.4* 28.5*  MCV 95.2 96.2 95.6  PLT 272 226 234   Cardiac Enzymes: No results for input(s): CKTOTAL, CKMB, CKMBINDEX, TROPONINI in the last 168 hours. BNP: Invalid input(s): POCBNP CBG: No results for input(s): GLUCAP in the last 168 hours. D-Dimer No results for input(s): DDIMER in the last 72 hours. Hgb A1c No results for input(s): HGBA1C in the last 72 hours. Lipid Profile No results for input(s): CHOL, HDL, LDLCALC, TRIG, CHOLHDL, LDLDIRECT in the last 72 hours. Thyroid function studies No results for input(s): TSH, T4TOTAL, T3FREE, THYROIDAB in the last 72 hours.  Invalid input(s): FREET3 Anemia work up No results for input(s): VITAMINB12, FOLATE, FERRITIN, TIBC, IRON, RETICCTPCT in the last 72 hours. Urinalysis    Component Value Date/Time   COLORURINE YELLOW 05/07/2020 1233   APPEARANCEUR CLOUDY (A) 05/07/2020 1233   LABSPEC 1.013 05/07/2020 1233   PHURINE 6.0 05/07/2020 1233   GLUCOSEU NEGATIVE 05/07/2020 1233   HGBUR SMALL (A) 05/07/2020 1233   BILIRUBINUR NEGATIVE 05/07/2020 1233   KETONESUR NEGATIVE 05/07/2020 1233   PROTEINUR 100 (A) 05/07/2020 1233   NITRITE NEGATIVE 05/07/2020 1233   LEUKOCYTESUR LARGE (A) 05/07/2020 1233   Sepsis Labs Invalid input(s): PROCALCITONIN,  WBC,  LACTICIDVEN Microbiology Recent Results (from the past 240 hour(s))  Resp Panel by RT-PCR (Flu A&B, Covid) Nasopharyngeal Swab     Status: None   Collection Time: 05/07/20 12:35 PM   Specimen: Nasopharyngeal Swab; Nasopharyngeal(NP) swabs in vial transport medium  Result Value Ref Range Status   SARS Coronavirus 2 by RT PCR NEGATIVE NEGATIVE Final   Influenza  A by PCR NEGATIVE NEGATIVE Final   Influenza B by PCR NEGATIVE NEGATIVE Final    Comment: Performed at [facility], [address], [place], [place] [address]  Blood culture (routine x 2)     Status: None (Preliminary result)   Collection Time: 05/07/20  1:15 PM   Specimen: BLOOD  Result Value Ref Range Status   Specimen Description   Final    BLOOD RIGHT ANTECUBITAL Performed at [facility], [address] [place], [place] [address]    Special Requests   Final    BOTTLES DRAWN AEROBIC AND ANAEROBIC Blood Culture adequate volume Performed at [facility], [address] [place], [place] [address]    Culture  Setup Time   Final    GRAM NEGATIVE COCCOBACILLI Gram Stain Report Called to,Read Back By and Verified With: JOHNSON,B @ 0143 ON 05/09/20 BY JUW ANAEROBIC BOTTLE ONLY GS DONE @ [facility] Organism ID to follow CRITICAL RESULT CALLED TO, READ BACK BY AND VERIFIED WITH: A MCKINNEY RN 05/09/20 9147 JDW Performed at [facility], [address] [place], [place] [address]    Culture   Final    NO GROWTH 2 DAYS Performed at [facility], [address] [place], [place] [address]    Report Status PENDING  Incomplete  Blood culture (routine x 2)     Status: None (Preliminary result)   Collection Time: 05/07/20  1:15 PM   Specimen: BLOOD RIGHT HAND  Result Value Ref Range Status   Specimen Description BLOOD RIGHT HAND  Final   Special Requests   Final    BOTTLES DRAWN AEROBIC ONLY Blood Culture results may not be optimal due to an inadequate volume of blood received in culture bottles   Culture   Final    NO GROWTH 2 DAYS Performed at [facility], [address] [place], [place] [address]    Report Status PENDING  Incomplete  Blood Culture ID Panel (Reflexed)     Status: Abnormal   Collection Time: 05/07/20  1:15 PM  Result Value Ref Range Status   Enterococcus faecalis NOT DETECTED NOT DETECTED Final   Enterococcus Faecium NOT DETECTED NOT DETECTED Final   Listeria  monocytogenes NOT DETECTED NOT DETECTED Final   Staphylococcus species NOT DETECTED NOT DETECTED Final   Staphylococcus aureus (BCID) NOT DETECTED NOT DETECTED Final   Staphylococcus epidermidis NOT DETECTED NOT DETECTED Final   Staphylococcus lugdunensis NOT DETECTED NOT DETECTED Final   Streptococcus species NOT DETECTED NOT DETECTED Final   Streptococcus agalactiae NOT DETECTED NOT DETECTED Final   Streptococcus pneumoniae NOT DETECTED NOT DETECTED Final   Streptococcus pyogenes NOT DETECTED NOT DETECTED Final   A.calcoaceticus-baumannii NOT DETECTED NOT DETECTED Final   Bacteroides fragilis NOT DETECTED NOT DETECTED Final   Enterobacterales DETECTED (A) NOT DETECTED Final    Comment: Enterobacterales represent a large order of gram negative bacteria, not a single organism. CRITICAL RESULT CALLED TO, READ BACK BY AND VERIFIED WITH: A MCKINNEY RN 05/09/20 0625 JDW    Enterobacter cloacae complex NOT DETECTED NOT DETECTED Final   Escherichia coli DETECTED (A) NOT DETECTED Final    Comment: CRITICAL RESULT CALLED TO, READ BACK BY AND VERIFIED WITH: A MCKINNEY RN 05/09/20 0625 JDW    Klebsiella aerogenes NOT DETECTED NOT DETECTED Final   Klebsiella oxytoca NOT DETECTED NOT DETECTED Final   Klebsiella pneumoniae NOT DETECTED  NOT DETECTED Final   Proteus species NOT DETECTED NOT DETECTED Final   Salmonella species NOT DETECTED NOT DETECTED Final   Serratia marcescens NOT DETECTED NOT DETECTED Final   Haemophilus influenzae NOT DETECTED NOT DETECTED Final   Neisseria meningitidis NOT DETECTED NOT DETECTED Final   Pseudomonas aeruginosa NOT DETECTED NOT DETECTED Final   Stenotrophomonas maltophilia NOT DETECTED NOT DETECTED Final   Candida albicans NOT DETECTED NOT DETECTED Final   Candida auris NOT DETECTED NOT DETECTED Final   Candida glabrata NOT DETECTED NOT DETECTED Final   Candida krusei NOT DETECTED NOT DETECTED Final   Candida parapsilosis NOT DETECTED NOT DETECTED Final    Candida tropicalis NOT DETECTED NOT DETECTED Final   Cryptococcus neoformans/gattii NOT DETECTED NOT DETECTED Final   CTX-M ESBL NOT DETECTED NOT DETECTED Final   Carbapenem resistance IMP NOT DETECTED NOT DETECTED Final   Carbapenem resistance KPC NOT DETECTED NOT DETECTED Final   Carbapenem resistance NDM NOT DETECTED NOT DETECTED Final   Carbapenem resist OXA 48 LIKE NOT DETECTED NOT DETECTED Final   Carbapenem resistance VIM NOT DETECTED NOT DETECTED Final    Comment: Performed at [facility], [address], [place], [place] [address]  Urine culture     Status: Abnormal   Collection Time: 05/07/20  3:23 PM   Specimen: Urine, Catheterized  Result Value Ref Range Status   Specimen Description   Final    URINE, CATHETERIZED Performed at [facility], [address] [place], [place] [address]    Special Requests   Final    NONE Performed at [facility], [address], [place], [place] [address]    Culture >=100,000 COLONIES/mL ESCHERICHIA COLI (A)  Final   Report Status 05/09/2020 FINAL  Final   Organism ID, Bacteria ESCHERICHIA COLI (A)  Final      Susceptibility   Escherichia coli - MIC*    AMPICILLIN <=2 SENSITIVE Sensitive     CEFAZOLIN <=4 SENSITIVE Sensitive     CEFEPIME <=0.12 SENSITIVE Sensitive     CEFTRIAXONE <=0.25 SENSITIVE Sensitive     CIPROFLOXACIN <=0.25 SENSITIVE Sensitive     GENTAMICIN <=1 SENSITIVE Sensitive     IMIPENEM <=0.25 SENSITIVE Sensitive     NITROFURANTOIN <=16 SENSITIVE Sensitive     TRIMETH/SULFA <=20 SENSITIVE Sensitive     AMPICILLIN/SULBACTAM <=2 SENSITIVE Sensitive     PIP/TAZO <=4 SENSITIVE Sensitive     * >=100,000 COLONIES/mL ESCHERICHIA COLI     Time coordinating discharge: 25  minutes  SIGNED: Lanae Boast, MD  [facility] 05/09/2020, 1:32 PM  If 7PM-7AM, please contact night-coverage [email or web address]

## 2020-05-11 ENCOUNTER — Encounter (HOSPITAL_COMMUNITY): Payer: Self-pay | Admitting: Urology

## 2020-05-11 LAB — CULTURE, BLOOD (ROUTINE X 2): Special Requests: ADEQUATE

## 2020-05-12 LAB — CULTURE, BLOOD (ROUTINE X 2)

## 2020-05-20 ENCOUNTER — Encounter: Payer: Self-pay | Admitting: Urology

## 2020-05-20 ENCOUNTER — Other Ambulatory Visit: Payer: Self-pay

## 2020-05-20 ENCOUNTER — Ambulatory Visit (INDEPENDENT_AMBULATORY_CARE_PROVIDER_SITE_OTHER): Payer: Medicaid Other | Admitting: Urology

## 2020-05-20 VITALS — BP 116/69 | HR 80 | Temp 97.9°F | Ht 62.0 in | Wt 195.0 lb

## 2020-05-20 DIAGNOSIS — N201 Calculus of ureter: Secondary | ICD-10-CM

## 2020-05-20 MED ORDER — OXYCODONE-ACETAMINOPHEN 5-325 MG PO TABS
1.0000 | ORAL_TABLET | ORAL | 0 refills | Status: DC | PRN
Start: 2020-05-20 — End: 2020-06-02

## 2020-05-20 NOTE — Progress Notes (Signed)
Letter given to pt per Dr. Ronne Binning.

## 2020-05-20 NOTE — Patient Instructions (Signed)

## 2020-05-20 NOTE — Progress Notes (Signed)
Urological Symptom Review  Patient is experiencing the following symptoms: Frequent urination Get up at night to urinate  Kidney stones   Review of Systems  Gastrointestinal (upper)  : Negative for upper GI symptoms  Gastrointestinal (lower) : Negative for lower GI symptoms  Constitutional : Negative for symptoms  Skin: Negative for skin symptoms  Eyes: Negative for eye symptoms  Ear/Nose/Throat : Negative for Ear/Nose/Throat symptoms  Hematologic/Lymphatic: Negative for Hematologic/Lymphatic symptoms  Cardiovascular : Negative for cardiovascular symptoms  Respiratory : Negative for respiratory symptoms  Endocrine: Negative for endocrine symptoms  Musculoskeletal: Negative for musculoskeletal symptoms  Neurological: Headaches  Psychologic: Negative for psychiatric symptoms

## 2020-05-20 NOTE — Progress Notes (Signed)
05/20/2020 4:12 PM   Sandra Carroll 1966/05/29 [id number]  Referring provider: No referring provider defined for this encounter.  nephrolithiasis  HPI: Sandra Carroll is a 54yo here for nephrolithiasis. She underwent right ureteral stent placement 2 weeks ago for 2 ureteral calculi and sepsis by Dr. Mena Goes. She has intermittent right flank pain with the stent in place. She has intermittent gross hematuria. This is her 6th stone event. Last stone event was in June 2021.    PMH: Past Medical History:  Diagnosis Date  . Anxiety   . Chronic back pain   . Complication of anesthesia 1991 and 1998   childbirth --reaction to anesthesia was itching.   . DDD (degenerative disc disease)   . Depression   . Headache    migraines  . Hemorrhoids   . History of kidney stones   . Hyperlipidemia   . Hypertension   . Kidney stone   . PONV (postoperative nausea and vomiting)     Surgical History: Past Surgical History:  Procedure Laterality Date  . ADENOIDECTOMY  1978  . CESAREAN SECTION  1991 1998   pt had itching with 1991 c-section.   . CHOLECYSTECTOMY    . COLONOSCOPY N/A 03/01/2013   Dr. Jena Gauss- normal rectum except for anal canal/internal hemorrhoidal tags. colonic mucosa appeared normal.  . CYSTOSCOPY W/ URETERAL STENT PLACEMENT Left 12/09/2019   Procedure: CYSTOSCOPY WITH RETROGRADE PYELOGRAM/URETERAL STENT PLACEMENT;  Surgeon: Bjorn Pippin, MD;  Location: [facility];  Service: Urology;  Laterality: Left;  . CYSTOSCOPY W/ URETERAL STENT PLACEMENT Right 05/07/2020   Procedure: CYSTOSCOPY WITH RETROGRADE PYELOGRAM/URETERAL STENT PLACEMENT;  Surgeon: Jerilee Field, MD;  Location: [facility];  Service: Urology;  Laterality: Right;  . CYSTOSCOPY/URETEROSCOPY/HOLMIUM LASER/STENT PLACEMENT Left 12/17/2019   Procedure: LEFT URETEROSCOPY/STENT EXCHANGE/ EXTRACTION OF LEFT URETERAL CALCULI;  Surgeon: Bjorn Pippin, MD;  Location: [facility];  Service: Urology;  Laterality: Left;  . KNEE ARTHROSCOPY   2003  . LITHOTRIPSY    . LUMBAR LAMINECTOMY/DECOMPRESSION MICRODISCECTOMY  03/02/2012   Procedure: LUMBAR LAMINECTOMY/DECOMPRESSION MICRODISCECTOMY 1 LEVEL;  Surgeon: Carmela Hurt, MD;  Location: [facility];  Service: Neurosurgery;  Laterality: Left;  LEFT Lumbar Five-Sacral One Laminectomy for Decompression     Home Medications:  Allergies as of 05/20/2020   No Known Allergies     Medication List       Accurate as of May 20, 2020  4:12 PM. If you have any questions, ask your nurse or doctor.        citalopram 20 MG tablet Commonly known as: CELEXA Take 1 tablet (20 mg total) by mouth daily.   citalopram 20 MG tablet Commonly known as: CELEXA Take 20 mg by mouth daily.   desogestrel-ethinyl estradiol 0.15-0.02/0.01 MG (21/5) tablet Commonly known as: Viorele Take 1 tablet by mouth daily.   Viorele 0.15-0.02/0.01 MG (21/5) tablet Generic drug: desogestrel-ethinyl estradiol Take 1 tablet by mouth daily.   lisinopril 20 MG tablet Commonly known as: ZESTRIL TAKE ONE (1) TABLET EACH DAY   lisinopril 20 MG tablet Commonly known as: ZESTRIL Take 1 tablet (20 mg total) by mouth daily.   Nurtec 75 MG Tbdp Generic drug: Rimegepant Sulfate Take 75 mg by mouth every other day as needed.   simvastatin 20 MG tablet Commonly known as: ZOCOR Take 1 tablet (20 mg total) by mouth every morning.   simvastatin 20 MG tablet Commonly known as: ZOCOR Take 20 mg by mouth at bedtime.       Allergies: No Known  Allergies  Family History: Family History  Problem Relation Age of Onset  . Hypertension Mother   . Cancer Father   . Hypertension Father   . Colon cancer Neg Hx     Social History:  reports that she quit smoking about 11 years ago. She has never used smokeless tobacco. She reports that she does not drink alcohol and does not use drugs.  ROS: All other review of systems were reviewed and are negative except what is noted above in HPI  Physical Exam: BP  116/69   Pulse 80   Temp 97.9 F (36.6 C)   Ht 5' 2" (1.575 m)   Wt 195 lb (88.5 kg)   LMP 10/17/2019 (Approximate)   BMI 35.67 kg/m   Constitutional:  Alert and oriented, No acute distress. HEENT: [place] AT, moist mucus membranes.  Trachea midline, no masses. Cardiovascular: No clubbing, cyanosis, or edema. Respiratory: Normal respiratory effort, no increased work of breathing. GI: Abdomen is soft, nontender, nondistended, no abdominal masses GU: No CVA tenderness.  Lymph: No cervical or inguinal lymphadenopathy. Skin: No rashes, bruises or suspicious lesions. Neurologic: Grossly intact, no focal deficits, moving all 4 extremities. Psychiatric: Normal mood and affect.  Laboratory Data: Lab Results  Component Value Date   WBC 10.2 05/09/2020   HGB 9.2 (L) 05/09/2020   HCT 28.5 (L) 05/09/2020   MCV 95.6 05/09/2020   PLT 234 05/09/2020    Lab Results  Component Value Date   CREATININE 1.28 (H) 05/09/2020    No results found for: PSA  No results found for: TESTOSTERONE  No results found for: HGBA1C  Urinalysis    Component Value Date/Time   COLORURINE YELLOW 05/07/2020 1233   APPEARANCEUR CLOUDY (A) 05/07/2020 1233   LABSPEC 1.013 05/07/2020 1233   PHURINE 6.0 05/07/2020 1233   GLUCOSEU NEGATIVE 05/07/2020 1233   HGBUR SMALL (A) 05/07/2020 1233   BILIRUBINUR NEGATIVE 05/07/2020 1233   KETONESUR NEGATIVE 05/07/2020 1233   PROTEINUR 100 (A) 05/07/2020 1233   UROBILINOGEN 0.2 07/25/2014 0215   NITRITE NEGATIVE 05/07/2020 1233   LEUKOCYTESUR LARGE (A) 05/07/2020 1233    Lab Results  Component Value Date   BACTERIA FEW (A) 05/07/2020    Pertinent Imaging: CT 05/07/2020: Images reviewed and discussed with the patient No results found for this or any previous visit.  No results found for this or any previous visit.  No results found for this or any previous visit.  No results found for this or any previous visit.  Results for orders placed during the  hospital encounter of 05/03/18  US Renal  Narrative CLINICAL DATA:  Left flank pain  EXAM: RENAL / URINARY TRACT ULTRASOUND COMPLETE  COMPARISON:  CT 12/09/2017  FINDINGS: Right Kidney:  Renal measurements: 11.1 x 6.2 x 6 cm = volume: 222.6 mL . Echogenicity within normal limits. Moderate right-sided hydronephrosis with intrarenal calculi the largest is interpolar at 4.2 mm. No apparent renal mass.  Left Kidney:  Renal measurements: 11.8 x 6.1 x 5.2 cm = volume: 98 mL. Echogenicity within normal limits. No mass. Marked hydronephrosis with a 3 mm upper pole renal calculus noted.  Bladder:  Under distended in appearance without focal mural thickening. No demonstrable ureteral jets are noted suggesting obstruction.  IMPRESSION: Moderate right and marked left dilatation of the renal collecting systems compatible with hydronephrosis with bilateral intrarenal calculi. No demonstrable ureteral jets to suggest patency of the ureters. Obstruction of the ureters is therefore raised possibly from ureteric calculi though the possibility   of stricture, ureteral lesions, extrinsic mass effect on the ureters or retroperitoneal fibrosis accounting for this appearance are not entirely excluded. Consider CT for further assessment.   Electronically Signed By: Tollie Eth M.D. On: 05/03/2018 14:53  No results found for this or any previous visit.  No results found for this or any previous visit.  Results for orders placed during the hospital encounter of 05/07/20  CT Renal Stone Study  Narrative CLINICAL DATA:  Right-sided lower back pain  EXAM: CT ABDOMEN AND PELVIS WITHOUT CONTRAST  TECHNIQUE: Multidetector CT imaging of the abdomen and pelvis was performed following the standard protocol without IV contrast.  COMPARISON:  None.  FINDINGS: Lower chest: The visualized heart size within normal limits. No pericardial fluid/thickening.  No hiatal hernia.  The  visualized portions of the lungs are clear.  Hepatobiliary: Although limited due to the lack of intravenous contrast, normal in appearance without gross focal abnormality. The patient is status post cholecystectomy. No biliary ductal dilation.  Pancreas:  Unremarkable.  No surrounding inflammatory changes.  Spleen: Normal in size. Although limited due to the lack of intravenous contrast, normal in appearance.  Adrenals/Urinary Tract: Both adrenal glands appear normal. Moderate right pelvicaliectasis and ureterectasis is seen down the level of the distal ureter where there is several ureteral calculi 1 measuring 3 mm and 1 measuring 4 mm. There is right-sided perinephric and proximal periureteral stranding changes. There is a tiny punctate calcification in the posterior right bladder. There is also a a 3 mm calculus seen in the mid pole the right kidney. No left-sided renal or collecting system calculi are noted.  Stomach/Bowel: The stomach, small bowel, and colon are normal in appearance. No inflammatory changes or obstructive findings. Scattered colonic diverticula are noted.  Vascular/Lymphatic: There are no enlarged abdominal or pelvic lymph nodes. Scattered aortic atherosclerotic calcifications are seen without aneurysmal dilatation.  Reproductive: The uterus and adnexa are unremarkable.  Other: Bilateral fat containing inguinal hernias are noted.  Musculoskeletal: No acute or significant osseous findings.  IMPRESSION: Moderate right hydronephrosis to the distal ureter where there is several calcified ureteral calculi measuring 3 mm and 4 mm. There is also punctate calcification in the posterior right bladder.  Right-sided perinephric and periureteral stranding which could be from the recently passed stone versus pyelonephritis.  Nonobstructing right renal calculus.   Electronically Signed By: Jonna Clark M.D. On: 05/07/2020 16:09   Assessment & Plan:    1.  Right ureteral calculus -We discussed the management of kidney stones. These options include observation, ureteroscopy, shockwave lithotripsy (ESWL) and percutaneous nephrolithotomy (PCNL). We discussed which options are relevant to the patient's stone(s). We discussed the natural history of kidney stones as well as the complications of untreated stones and the impact on quality of life without treatment as well as with each of the above listed treatments. We also discussed the efficacy of each treatment in its ability to clear the stone burden. With any of these management options I discussed the signs and symptoms of infection and the need for emergent treatment should these be experienced. For each option we discussed the ability of each procedure to clear the patient of their stone burden.   For observation I described the risks which include but are not limited to silent renal damage, life-threatening infection, need for emergent surgery, failure to pass stone and pain.   For ureteroscopy I described the risks which include bleeding, infection, damage to contiguous structures, positioning injury, ureteral stricture, ureteral avulsion, ureteral injury,  need for prolonged ureteral stent, inability to perform ureteroscopy, need for an interval procedure, inability to clear stone burden, stent discomfort/pain, heart attack, stroke, pulmonary embolus and the inherent risks with general anesthesia.   For shockwave lithotripsy I described the risks which include arrhythmia, kidney contusion, kidney hemorrhage, need for transfusion, pain, inability to adequately break up stone, inability to pass stone fragments, Steinstrasse, infection associated with obstructing stones, need for alternate surgical procedure, need for repeat shockwave lithotripsy, MI, CVA, PE and the inherent risks with anesthesia/conscious sedation.   For PCNL I described the risks including positioning injury, pneumothorax, hydrothorax, need  for chest tube, inability to clear stone burden, renal laceration, arterial venous fistula or malformation, need for embolization of kidney, loss of kidney or renal function, need for repeat procedure, need for prolonged nephrostomy tube, ureteral avulsion, MI, CVA, PE and the inherent risks of general anesthesia.   - The patient would like to proceed with Right ureteroscopic stone extraction - Urinalysis, Routine w reflex microscopic   No follow-ups on file.  [name], MD  [facility] [place]

## 2020-05-20 NOTE — H&P (View-Only) (Signed)
05/20/2020 4:12 PM   Sandra Carroll 1966/05/29 [id number]  Referring provider: No referring provider defined for this encounter.  nephrolithiasis  HPI: Sandra Carroll is a 54yo here for nephrolithiasis. She underwent right ureteral stent placement 2 weeks ago for 2 ureteral calculi and sepsis by Dr. Mena Goes. She has intermittent right flank pain with the stent in place. She has intermittent gross hematuria. This is her 6th stone event. Last stone event was in June 2021.    PMH: Past Medical History:  Diagnosis Date  . Anxiety   . Chronic back pain   . Complication of anesthesia 1991 and 1998   childbirth --reaction to anesthesia was itching.   . DDD (degenerative disc disease)   . Depression   . Headache    migraines  . Hemorrhoids   . History of kidney stones   . Hyperlipidemia   . Hypertension   . Kidney stone   . PONV (postoperative nausea and vomiting)     Surgical History: Past Surgical History:  Procedure Laterality Date  . ADENOIDECTOMY  1978  . CESAREAN SECTION  1991 1998   pt had itching with 1991 c-section.   . CHOLECYSTECTOMY    . COLONOSCOPY N/A 03/01/2013   Dr. Jena Gauss- normal rectum except for anal canal/internal hemorrhoidal tags. colonic mucosa appeared normal.  . CYSTOSCOPY W/ URETERAL STENT PLACEMENT Left 12/09/2019   Procedure: CYSTOSCOPY WITH RETROGRADE PYELOGRAM/URETERAL STENT PLACEMENT;  Surgeon: Bjorn Pippin, MD;  Location: [facility];  Service: Urology;  Laterality: Left;  . CYSTOSCOPY W/ URETERAL STENT PLACEMENT Right 05/07/2020   Procedure: CYSTOSCOPY WITH RETROGRADE PYELOGRAM/URETERAL STENT PLACEMENT;  Surgeon: Jerilee Field, MD;  Location: [facility];  Service: Urology;  Laterality: Right;  . CYSTOSCOPY/URETEROSCOPY/HOLMIUM LASER/STENT PLACEMENT Left 12/17/2019   Procedure: LEFT URETEROSCOPY/STENT EXCHANGE/ EXTRACTION OF LEFT URETERAL CALCULI;  Surgeon: Bjorn Pippin, MD;  Location: [facility];  Service: Urology;  Laterality: Left;  . KNEE ARTHROSCOPY   2003  . LITHOTRIPSY    . LUMBAR LAMINECTOMY/DECOMPRESSION MICRODISCECTOMY  03/02/2012   Procedure: LUMBAR LAMINECTOMY/DECOMPRESSION MICRODISCECTOMY 1 LEVEL;  Surgeon: Carmela Hurt, MD;  Location: [facility];  Service: Neurosurgery;  Laterality: Left;  LEFT Lumbar Five-Sacral One Laminectomy for Decompression     Home Medications:  Allergies as of 05/20/2020   No Known Allergies     Medication List       Accurate as of May 20, 2020  4:12 PM. If you have any questions, ask your nurse or doctor.        citalopram 20 MG tablet Commonly known as: CELEXA Take 1 tablet (20 mg total) by mouth daily.   citalopram 20 MG tablet Commonly known as: CELEXA Take 20 mg by mouth daily.   desogestrel-ethinyl estradiol 0.15-0.02/0.01 MG (21/5) tablet Commonly known as: Viorele Take 1 tablet by mouth daily.   Viorele 0.15-0.02/0.01 MG (21/5) tablet Generic drug: desogestrel-ethinyl estradiol Take 1 tablet by mouth daily.   lisinopril 20 MG tablet Commonly known as: ZESTRIL TAKE ONE (1) TABLET EACH DAY   lisinopril 20 MG tablet Commonly known as: ZESTRIL Take 1 tablet (20 mg total) by mouth daily.   Nurtec 75 MG Tbdp Generic drug: Rimegepant Sulfate Take 75 mg by mouth every other day as needed.   simvastatin 20 MG tablet Commonly known as: ZOCOR Take 1 tablet (20 mg total) by mouth every morning.   simvastatin 20 MG tablet Commonly known as: ZOCOR Take 20 mg by mouth at bedtime.       Allergies: No Known  Allergies  Family History: Family History  Problem Relation Age of Onset  . Hypertension Mother   . Cancer Father   . Hypertension Father   . Colon cancer Neg Hx     Social History:  reports that she quit smoking about 11 years ago. She has never used smokeless tobacco. She reports that she does not drink alcohol and does not use drugs.  ROS: All other review of systems were reviewed and are negative except what is noted above in HPI  Physical Exam: BP  116/69   Pulse 80   Temp 97.9 F (36.6 C)   Ht 5\' 2"  (1.575 m)   Wt 195 lb (88.5 kg)   LMP 10/17/2019 (Approximate)   BMI 35.67 kg/m   Constitutional:  Alert and oriented, No acute distress. HEENT: [place] AT, moist mucus membranes.  Trachea midline, no masses. Cardiovascular: No clubbing, cyanosis, or edema. Respiratory: Normal respiratory effort, no increased work of breathing. GI: Abdomen is soft, nontender, nondistended, no abdominal masses GU: No CVA tenderness.  Lymph: No cervical or inguinal lymphadenopathy. Skin: No rashes, bruises or suspicious lesions. Neurologic: Grossly intact, no focal deficits, moving all 4 extremities. Psychiatric: Normal mood and affect.  Laboratory Data: Lab Results  Component Value Date   WBC 10.2 05/09/2020   HGB 9.2 (L) 05/09/2020   HCT 28.5 (L) 05/09/2020   MCV 95.6 05/09/2020   PLT 234 05/09/2020    Lab Results  Component Value Date   CREATININE 1.28 (H) 05/09/2020    No results found for: PSA  No results found for: TESTOSTERONE  No results found for: HGBA1C  Urinalysis    Component Value Date/Time   COLORURINE YELLOW 05/07/2020 1233   APPEARANCEUR CLOUDY (A) 05/07/2020 1233   LABSPEC 1.013 05/07/2020 1233   PHURINE 6.0 05/07/2020 1233   GLUCOSEU NEGATIVE 05/07/2020 1233   HGBUR SMALL (A) 05/07/2020 1233   BILIRUBINUR NEGATIVE 05/07/2020 1233   KETONESUR NEGATIVE 05/07/2020 1233   PROTEINUR 100 (A) 05/07/2020 1233   UROBILINOGEN 0.2 07/25/2014 0215   NITRITE NEGATIVE 05/07/2020 1233   LEUKOCYTESUR LARGE (A) 05/07/2020 1233    Lab Results  Component Value Date   BACTERIA FEW (A) 05/07/2020    Pertinent Imaging: CT 05/07/2020: Images reviewed and discussed with the patient No results found for this or any previous visit.  No results found for this or any previous visit.  No results found for this or any previous visit.  No results found for this or any previous visit.  Results for orders placed during the  hospital encounter of 05/03/18  US Renal  Narrative CLINICAL DATA:  Left flank pain  EXAM: RENAL / URINARY TRACT ULTRASOUND COMPLETE  COMPARISON:  CT 12/09/2017  FINDINGS: Right Kidney:  Renal measurements: 11.1 x 6.2 x 6 cm = volume: 222.6 mL . Echogenicity within normal limits. Moderate right-sided hydronephrosis with intrarenal calculi the largest is interpolar at 4.2 mm. No apparent renal mass.  Left Kidney:  Renal measurements: 11.8 x 6.1 x 5.2 cm = volume: 98 mL. Echogenicity within normal limits. No mass. Marked hydronephrosis with a 3 mm upper pole renal calculus noted.  Bladder:  Under distended in appearance without focal mural thickening. No demonstrable ureteral jets are noted suggesting obstruction.  IMPRESSION: Moderate right and marked left dilatation of the renal collecting systems compatible with hydronephrosis with bilateral intrarenal calculi. No demonstrable ureteral jets to suggest patency of the ureters. Obstruction of the ureters is therefore raised possibly from ureteric calculi though the possibility  of stricture, ureteral lesions, extrinsic mass effect on the ureters or retroperitoneal fibrosis accounting for this appearance are not entirely excluded. Consider CT for further assessment.   Electronically Signed By: Tollie Eth M.D. On: 05/03/2018 14:53  No results found for this or any previous visit.  No results found for this or any previous visit.  Results for orders placed during the hospital encounter of 05/07/20  CT Renal Stone Study  Narrative CLINICAL DATA:  Right-sided lower back pain  EXAM: CT ABDOMEN AND PELVIS WITHOUT CONTRAST  TECHNIQUE: Multidetector CT imaging of the abdomen and pelvis was performed following the standard protocol without IV contrast.  COMPARISON:  None.  FINDINGS: Lower chest: The visualized heart size within normal limits. No pericardial fluid/thickening.  No hiatal hernia.  The  visualized portions of the lungs are clear.  Hepatobiliary: Although limited due to the lack of intravenous contrast, normal in appearance without gross focal abnormality. The patient is status post cholecystectomy. No biliary ductal dilation.  Pancreas:  Unremarkable.  No surrounding inflammatory changes.  Spleen: Normal in size. Although limited due to the lack of intravenous contrast, normal in appearance.  Adrenals/Urinary Tract: Both adrenal glands appear normal. Moderate right pelvicaliectasis and ureterectasis is seen down the level of the distal ureter where there is several ureteral calculi 1 measuring 3 mm and 1 measuring 4 mm. There is right-sided perinephric and proximal periureteral stranding changes. There is a tiny punctate calcification in the posterior right bladder. There is also a a 3 mm calculus seen in the mid pole the right kidney. No left-sided renal or collecting system calculi are noted.  Stomach/Bowel: The stomach, small bowel, and colon are normal in appearance. No inflammatory changes or obstructive findings. Scattered colonic diverticula are noted.  Vascular/Lymphatic: There are no enlarged abdominal or pelvic lymph nodes. Scattered aortic atherosclerotic calcifications are seen without aneurysmal dilatation.  Reproductive: The uterus and adnexa are unremarkable.  Other: Bilateral fat containing inguinal hernias are noted.  Musculoskeletal: No acute or significant osseous findings.  IMPRESSION: Moderate right hydronephrosis to the distal ureter where there is several calcified ureteral calculi measuring 3 mm and 4 mm. There is also punctate calcification in the posterior right bladder.  Right-sided perinephric and periureteral stranding which could be from the recently passed stone versus pyelonephritis.  Nonobstructing right renal calculus.   Electronically Signed By: Jonna Clark M.D. On: 05/07/2020 16:09   Assessment & Plan:    1.  Right ureteral calculus -We discussed the management of kidney stones. These options include observation, ureteroscopy, shockwave lithotripsy (ESWL) and percutaneous nephrolithotomy (PCNL). We discussed which options are relevant to the patient's stone(s). We discussed the natural history of kidney stones as well as the complications of untreated stones and the impact on quality of life without treatment as well as with each of the above listed treatments. We also discussed the efficacy of each treatment in its ability to clear the stone burden. With any of these management options I discussed the signs and symptoms of infection and the need for emergent treatment should these be experienced. For each option we discussed the ability of each procedure to clear the patient of their stone burden.   For observation I described the risks which include but are not limited to silent renal damage, life-threatening infection, need for emergent surgery, failure to pass stone and pain.   For ureteroscopy I described the risks which include bleeding, infection, damage to contiguous structures, positioning injury, ureteral stricture, ureteral avulsion, ureteral injury,  need for prolonged ureteral stent, inability to perform ureteroscopy, need for an interval procedure, inability to clear stone burden, stent discomfort/pain, heart attack, stroke, pulmonary embolus and the inherent risks with general anesthesia.   For shockwave lithotripsy I described the risks which include arrhythmia, kidney contusion, kidney hemorrhage, need for transfusion, pain, inability to adequately break up stone, inability to pass stone fragments, Steinstrasse, infection associated with obstructing stones, need for alternate surgical procedure, need for repeat shockwave lithotripsy, MI, CVA, PE and the inherent risks with anesthesia/conscious sedation.   For PCNL I described the risks including positioning injury, pneumothorax, hydrothorax, need  for chest tube, inability to clear stone burden, renal laceration, arterial venous fistula or malformation, need for embolization of kidney, loss of kidney or renal function, need for repeat procedure, need for prolonged nephrostomy tube, ureteral avulsion, MI, CVA, PE and the inherent risks of general anesthesia.   - The patient would like to proceed with Right ureteroscopic stone extraction - Urinalysis, Routine w reflex microscopic   No follow-ups on file.  [name], MD  [facility] [place]

## 2020-05-22 LAB — URINALYSIS, ROUTINE W REFLEX MICROSCOPIC
Bilirubin, UA: NEGATIVE
Glucose, UA: NEGATIVE
Ketones, UA: NEGATIVE
Nitrite, UA: NEGATIVE
Specific Gravity, UA: 1.025 (ref 1.005–1.030)
Urobilinogen, Ur: 0.2 mg/dL (ref 0.2–1.0)
pH, UA: 7 (ref 5.0–7.5)

## 2020-05-22 LAB — MICROSCOPIC EXAMINATION
Epithelial Cells (non renal): >10 /hpf — AB (ref 0–10)
RBC, Urine: >30 /hpf — AB (ref 0–2)
Renal Epithel, UA: NONE SEEN /hpf

## 2020-05-25 NOTE — Patient Instructions (Signed)
Sandra Carroll  05/25/2020     @PREFPERIOPPHARMACY @   Your procedure is scheduled on 06/02/2020.  Report to 06/04/2020 at  0920  A.M.  Call this number if you have problems the morning of surgery:  [phone]   Remember:  Do not eat or drink after midnight.                      Take these medicines the morning of surgery with A SIP OF WATER Celexa, oxycodone(if needed), nurtec(if needed).    Do not wear jewelry, make-up or nail polish.  Do not wear lotions, powders, or perfumes. Please wear deodorant and brush your teeth.  Do not shave 48 hours prior to surgery.  Men may shave face and neck.  Do not bring valuables to the hospital.  [facility] is not responsible for any belongings or valuables.  Contacts, dentures or bridgework may not be worn into surgery.  Leave your suitcase in the car.  After surgery it may be brought to your room.  For patients admitted to the hospital, discharge time will be determined by your treatment team.  Patients discharged the day of surgery will not be allowed to drive home.   Name and phone number of your driver:   family Special instructions:  DO NOT smoke the morning of your procedure.  Please read over the following fact sheets that you were given. Anesthesia Post-op Instructions and Care and Recovery After Surgery       Ureteral Stent Implantation, Care After This sheet gives you information about how to care for yourself after your procedure. Your health care provider may also give you more specific instructions. If you have problems or questions, contact your health care provider. What can I expect after the procedure? After the procedure, it is common to have:  Nausea.  Mild pain when you urinate. You may feel this pain in your lower back or lower abdomen. The pain should stop within a few minutes after you urinate. This may last for up to 1 week.  A small amount of blood in your urine for several days. Follow  these instructions at home: Medicines  Take over-the-counter and prescription medicines only as told by your health care provider.  If you were prescribed an antibiotic medicine, take it as told by your health care provider. Do not stop taking the antibiotic even if you start to feel better.  Do not drive for 24 hours if you were given a sedative during your procedure.  Ask your health care provider if the medicine prescribed to you requires you to avoid driving or using heavy machinery. Activity  Rest as told by your health care provider.  Avoid sitting for a long time without moving. Get up to take short walks every 1-2 hours. This is important to improve blood flow and breathing. Ask for help if you feel weak or unsteady.  Return to your normal activities as told by your health care provider. Ask your health care provider what activities are safe for you. General instructions   Watch for any blood in your urine. Call your health care provider if the amount of blood in your urine increases.  If you have a catheter: ? Follow instructions from your health care provider about taking care of your catheter and collection bag. ? Do not take baths, swim, or use a hot tub until your health care provider approves. Ask your health care  provider if you may take showers. You may only be allowed to take sponge baths.  Drink enough fluid to keep your urine pale yellow.  Do not use any products that contain nicotine or tobacco, such as cigarettes, e-cigarettes, and chewing tobacco. These can delay healing after surgery. If you need help quitting, ask your health care provider.  Keep all follow-up visits as told by your health care provider. This is important. Contact a health care provider if:  You have pain that gets worse or does not get better with medicine, especially pain when you urinate.  You have difficulty urinating.  You feel nauseous or you vomit repeatedly during a period of more  than 2 days after the procedure. Get help right away if:  Your urine is dark red or has blood clots in it.  You are leaking urine (have incontinence).  The end of the stent comes out of your urethra.  You cannot urinate.  You have sudden, sharp, or severe pain in your abdomen or lower back.  You have a fever.  You have swelling or pain in your legs.  You have difficulty breathing. Summary  After the procedure, it is common to have mild pain when you urinate that goes away within a few minutes after you urinate. This may last for up to 1 week.  Watch for any blood in your urine. Call your health care provider if the amount of blood in your urine increases.  Take over-the-counter and prescription medicines only as told by your health care provider.  Drink enough fluid to keep your urine pale yellow. This information is not intended to replace advice given to you by your health care provider. Make sure you discuss any questions you have with your health care provider. Document Revised: 03/13/2018 Document Reviewed: 03/14/2018 Elsevier Patient Education  2020 [employer] Monitored Anesthesia Care, Care After These instructions provide you with information about caring for yourself after your procedure. Your health care provider may also give you more specific instructions. Your treatment has been planned according to current medical practices, but problems sometimes occur. Call your health care provider if you have any problems or questions after your procedure. What can I expect after the procedure? After your procedure, you may:  Feel sleepy for several hours.  Feel clumsy and have poor balance for several hours.  Feel forgetful about what happened after the procedure.  Have poor judgment for several hours.  Feel nauseous or vomit.  Have a sore throat if you had a breathing tube during the procedure. Follow these instructions at home: For at least 24 hours after the  procedure:      Have a responsible adult stay with you. It is important to have someone help care for you until you are awake and alert.  Rest as needed.  Do not: ? Participate in activities in which you could fall or become injured. ? Drive. ? Use heavy machinery. ? Drink alcohol. ? Take sleeping pills or medicines that cause drowsiness. ? Make important decisions or sign legal documents. ? Take care of children on your own. Eating and drinking  Follow the diet that is recommended by your health care provider.  If you vomit, drink water, juice, or soup when you can drink without vomiting.  Make sure you have little or no nausea before eating solid foods. General instructions  Take over-the-counter and prescription medicines only as told by your health care provider.  If you have sleep apnea, surgery and  certain medicines can increase your risk for breathing problems. Follow instructions from your health care provider about wearing your sleep device: ? Anytime you are sleeping, including during daytime naps. ? While taking prescription pain medicines, sleeping medicines, or medicines that make you drowsy.  If you smoke, do not smoke without supervision.  Keep all follow-up visits as told by your health care provider. This is important. Contact a health care provider if:  You keep feeling nauseous or you keep vomiting.  You feel light-headed.  You develop a rash.  You have a fever. Get help right away if:  You have trouble breathing. Summary  For several hours after your procedure, you may feel sleepy and have poor judgment.  Have a responsible adult stay with you for at least 24 hours or until you are awake and alert. This information is not intended to replace advice given to you by your health care provider. Make sure you discuss any questions you have with your health care provider. Document Revised: 09/04/2017 Document Reviewed: 09/27/2015 Elsevier Patient  Education  2020 [employer].

## 2020-05-29 ENCOUNTER — Encounter (HOSPITAL_COMMUNITY)
Admission: RE | Admit: 2020-05-29 | Discharge: 2020-05-29 | Disposition: A | Discharge status: Home / Self Care | Payer: PRIVATE HEALTH INSURANCE | Source: Ambulatory Visit | Attending: Urology | Admitting: Urology

## 2020-05-29 ENCOUNTER — Other Ambulatory Visit (HOSPITAL_COMMUNITY)
Admission: RE | Admit: 2020-05-29 | Discharge: 2020-05-29 | Disposition: A | Discharge status: Home / Self Care | Payer: Medicaid Other | Source: Ambulatory Visit | Attending: Urology | Admitting: Urology

## 2020-05-29 ENCOUNTER — Encounter (HOSPITAL_COMMUNITY): Payer: Self-pay

## 2020-05-29 ENCOUNTER — Other Ambulatory Visit: Payer: Self-pay

## 2020-05-29 DIAGNOSIS — Z20822 Contact with and (suspected) exposure to covid-19: Secondary | ICD-10-CM | POA: Diagnosis not present

## 2020-05-29 DIAGNOSIS — Z01812 Encounter for preprocedural laboratory examination: Secondary | ICD-10-CM | POA: Insufficient documentation

## 2020-05-29 LAB — SARS CORONAVIRUS 2 (TAT 6-24 HRS): SARS Coronavirus 2: NEGATIVE

## 2020-06-02 ENCOUNTER — Encounter (HOSPITAL_COMMUNITY): Payer: Self-pay | Admitting: Urology

## 2020-06-02 ENCOUNTER — Ambulatory Visit (HOSPITAL_COMMUNITY): Payer: Self-pay | Admitting: Certified Registered"

## 2020-06-02 ENCOUNTER — Ambulatory Visit (HOSPITAL_COMMUNITY)
Admission: RE | Admit: 2020-06-02 | Discharge: 2020-06-02 | Disposition: A | Discharge status: Home / Self Care | Payer: Self-pay | Attending: Urology | Admitting: Urology

## 2020-06-02 ENCOUNTER — Encounter (HOSPITAL_COMMUNITY)
Admission: RE | Disposition: A | Discharge status: Home / Self Care | Payer: Self-pay | Source: Home / Self Care | Attending: Urology

## 2020-06-02 ENCOUNTER — Ambulatory Visit (HOSPITAL_COMMUNITY): Payer: Self-pay

## 2020-06-02 DIAGNOSIS — N201 Calculus of ureter: Secondary | ICD-10-CM | POA: Insufficient documentation

## 2020-06-02 DIAGNOSIS — Z9049 Acquired absence of other specified parts of digestive tract: Secondary | ICD-10-CM | POA: Insufficient documentation

## 2020-06-02 DIAGNOSIS — N200 Calculus of kidney: Secondary | ICD-10-CM

## 2020-06-02 DIAGNOSIS — Z79899 Other long term (current) drug therapy: Secondary | ICD-10-CM | POA: Insufficient documentation

## 2020-06-02 HISTORY — PX: CYSTOSCOPY WITH RETROGRADE PYELOGRAM, URETEROSCOPY AND STENT PLACEMENT: SHX5789

## 2020-06-02 SURGERY — CYSTOURETEROSCOPY, WITH RETROGRADE PYELOGRAM AND STENT INSERTION
Anesthesia: General | Laterality: Right

## 2020-06-02 MED ORDER — LACTATED RINGERS IV SOLN: INTRAVENOUS | Status: DC

## 2020-06-02 MED ORDER — EPHEDRINE SULFATE 50 MG/ML IJ SOLN
INTRAMUSCULAR | Status: DC | PRN
Start: 2020-06-02 — End: 2020-06-02
  Administered 2020-06-02: 10 mg via INTRAVENOUS

## 2020-06-02 MED ORDER — MIDAZOLAM HCL 2 MG/2ML IJ SOLN
INTRAMUSCULAR | Status: DC | PRN
  Administered 2020-06-02: 1 mg via INTRAVENOUS

## 2020-06-02 MED ORDER — DIATRIZOATE MEGLUMINE 30 % UR SOLN
URETHRAL | Status: DC | PRN
  Administered 2020-06-02: 10 mL via URETHRAL

## 2020-06-02 MED ORDER — CHLORHEXIDINE GLUCONATE 0.12 % MT SOLN: 15.0000 mL | Freq: Once | OROMUCOSAL | Status: DC

## 2020-06-02 MED ORDER — PROPOFOL 10 MG/ML IV BOLUS
INTRAVENOUS | Status: AC
  Filled 2020-06-02: qty 40

## 2020-06-02 MED ORDER — LIDOCAINE HCL (PF) 2 % IJ SOLN
INTRAMUSCULAR | Status: AC
  Filled 2020-06-02: qty 5

## 2020-06-02 MED ORDER — SODIUM CHLORIDE 0.9 % IV SOLN
INTRAVENOUS | Status: AC
  Filled 2020-06-02: qty 20

## 2020-06-02 MED ORDER — FENTANYL CITRATE (PF) 100 MCG/2ML IJ SOLN
INTRAMUSCULAR | Status: AC
  Filled 2020-06-02: qty 2

## 2020-06-02 MED ORDER — LACTATED RINGERS IV SOLN: INTRAVENOUS | Status: DC | PRN

## 2020-06-02 MED ORDER — ORAL CARE MOUTH RINSE: 15.0000 mL | Freq: Once | OROMUCOSAL | Status: DC

## 2020-06-02 MED ORDER — GLYCOPYRROLATE PF 0.2 MG/ML IJ SOSY
PREFILLED_SYRINGE | INTRAMUSCULAR | Status: AC
  Filled 2020-06-02: qty 1

## 2020-06-02 MED ORDER — MIDAZOLAM HCL 2 MG/2ML IJ SOLN
INTRAMUSCULAR | Status: AC
  Filled 2020-06-02: qty 2

## 2020-06-02 MED ORDER — ROCURONIUM BROMIDE 10 MG/ML (PF) SYRINGE
PREFILLED_SYRINGE | INTRAVENOUS | Status: AC
  Filled 2020-06-02: qty 10

## 2020-06-02 MED ORDER — EPHEDRINE 5 MG/ML INJ
INTRAVENOUS | Status: AC
  Filled 2020-06-02: qty 10

## 2020-06-02 MED ORDER — WATER FOR IRRIGATION, STERILE IR SOLN
Status: DC | PRN
  Administered 2020-06-02: 1000 mL

## 2020-06-02 MED ORDER — DEXAMETHASONE SODIUM PHOSPHATE 10 MG/ML IJ SOLN
INTRAMUSCULAR | Status: AC
  Filled 2020-06-02: qty 1

## 2020-06-02 MED ORDER — OXYCODONE-ACETAMINOPHEN 5-325 MG PO TABS: 1.0000 | ORAL_TABLET | ORAL | 0 refills | Status: DC | PRN

## 2020-06-02 MED ORDER — ONDANSETRON HCL 4 MG/2ML IJ SOLN
INTRAMUSCULAR | Status: AC
  Filled 2020-06-02: qty 2

## 2020-06-02 MED ORDER — DEXAMETHASONE SODIUM PHOSPHATE 4 MG/ML IJ SOLN
INTRAMUSCULAR | Status: DC | PRN
  Administered 2020-06-02: 5 mg via INTRAVENOUS

## 2020-06-02 MED ORDER — GLYCOPYRROLATE 0.2 MG/ML IJ SOLN
INTRAMUSCULAR | Status: DC | PRN
  Administered 2020-06-02 (×2): .1 mg via INTRAVENOUS

## 2020-06-02 MED ORDER — PROPOFOL 10 MG/ML IV BOLUS
INTRAVENOUS | Status: DC | PRN
  Administered 2020-06-02: 150 mg via INTRAVENOUS

## 2020-06-02 MED ORDER — DIATRIZOATE MEGLUMINE 30 % UR SOLN
URETHRAL | Status: AC
  Filled 2020-06-02: qty 100

## 2020-06-02 MED ORDER — SODIUM CHLORIDE 0.9 % IV SOLN
2.0000 g | INTRAVENOUS | Status: AC
  Administered 2020-06-02: 10:00:00 2 g via INTRAVENOUS

## 2020-06-02 MED ORDER — SODIUM CHLORIDE 0.9 % IR SOLN
Status: DC | PRN
  Administered 2020-06-02: 3000 mL via INTRAVESICAL

## 2020-06-02 MED ORDER — FENTANYL CITRATE (PF) 100 MCG/2ML IJ SOLN
INTRAMUSCULAR | Status: DC | PRN
  Administered 2020-06-02 (×2): 50 ug via INTRAVENOUS

## 2020-06-02 MED ORDER — ONDANSETRON HCL 4 MG/2ML IJ SOLN
INTRAMUSCULAR | Status: DC | PRN
  Administered 2020-06-02: 4 mg via INTRAVENOUS

## 2020-06-02 MED ORDER — LIDOCAINE HCL (CARDIAC) PF 100 MG/5ML IV SOSY
PREFILLED_SYRINGE | INTRAVENOUS | Status: DC | PRN
  Administered 2020-06-02: 50 mg via INTRAVENOUS

## 2020-06-02 SURGICAL SUPPLY — 25 items
BAG DRAIN URO TABLE W/ADPT NS (BAG) ×3 IMPLANT
BAG DRN 8 ADPR NS SKTRN CSTL (BAG) ×1
BAG HAMPER (MISCELLANEOUS) ×3 IMPLANT
CATH INTERMIT  6FR 70CM (CATHETERS) ×3 IMPLANT
CLOTH BEACON ORANGE TIMEOUT ST (SAFETY) ×3 IMPLANT
DECANTER SPIKE VIAL GLASS SM (MISCELLANEOUS) ×3 IMPLANT
EXTRACTOR STONE NITINOL NGAGE (UROLOGICAL SUPPLIES) ×3 IMPLANT
GLOVE BIO SURGEON STRL SZ8 (GLOVE) ×3 IMPLANT
GLOVE BIOGEL PI IND STRL 7.0 (GLOVE) ×3 IMPLANT
GLOVE BIOGEL PI INDICATOR 7.0 (GLOVE) ×6
GOWN STRL REUS W/TWL LRG LVL3 (GOWN DISPOSABLE) ×3 IMPLANT
GOWN STRL REUS W/TWL XL LVL3 (GOWN DISPOSABLE) ×3 IMPLANT
GUIDEWIRE STR DUAL SENSOR (WIRE) ×3 IMPLANT
GUIDEWIRE STR ZIPWIRE 035X150 (MISCELLANEOUS) ×3 IMPLANT
IV NS IRRIG 3000ML ARTHROMATIC (IV SOLUTION) ×6 IMPLANT
KIT TURNOVER CYSTO (KITS) ×3 IMPLANT
MANIFOLD NEPTUNE II (INSTRUMENTS) ×3 IMPLANT
PACK CYSTO (CUSTOM PROCEDURE TRAY) ×3 IMPLANT
PAD ARMBOARD 7.5X6 YLW CONV (MISCELLANEOUS) ×3 IMPLANT
STENT URET 6FRX26 CONTOUR (STENTS) ×3 IMPLANT
SYR 10ML LL (SYRINGE) ×3 IMPLANT
TOWEL OR 17X26 4PK STRL BLUE (TOWEL DISPOSABLE) ×3 IMPLANT
TRACTIP FLEXIVA PULS ID 200XHI (Laser) ×1 IMPLANT
TRACTIP FLEXIVA PULSE ID 200 (Laser) ×3
WATER STERILE IRR 500ML POUR (IV SOLUTION) ×3 IMPLANT

## 2020-06-02 NOTE — Anesthesia Preprocedure Evaluation (Addendum)
Anesthesia Evaluation  Patient identified by MRN, date of birth, ID band Patient awake    Reviewed: Allergy & Precautions, NPO status , Patient's Chart, lab work & pertinent test results  History of Anesthesia Complications Negative for: history of anesthetic complications  Airway Mallampati: II  TM Distance: >3 FB Neck ROM: Full    Dental no notable dental hx. (+) Dental Advisory Given, Teeth Intact   Pulmonary former smoker,    Pulmonary exam normal breath sounds clear to auscultation       Cardiovascular Exercise Tolerance: Good hypertension, Pt. on medications  Rhythm:Regular Rate:Tachycardia     Neuro/Psych negative neurological ROS  negative psych ROS   GI/Hepatic negative GI ROS, Neg liver ROS,   Endo/Other  negative endocrine ROS  Renal/GU Renal InsufficiencyRenal disease (AKI)  negative genitourinary   Musculoskeletal negative musculoskeletal ROS (+)   Abdominal   Peds  Hematology  (+) Blood dyscrasia, anemia ,   Anesthesia Other Findings   Reproductive/Obstetrics negative OB ROS                            Anesthesia Physical  Anesthesia Plan  ASA: III  Anesthesia Plan: General   Post-op Pain Management:    Induction: Intravenous  PONV Risk Score and Plan:   Airway Management Planned: Nasal Cannula, Natural Airway and Simple Face Mask  Additional Equipment:   Intra-op Plan:   Post-operative Plan:   Informed Consent: I have reviewed the patients History and Physical, chart, labs and discussed the procedure including the risks, benefits and alternatives for the proposed anesthesia with the patient or authorized representative who has indicated his/her understanding and acceptance.     Dental advisory given  Plan Discussed with: CRNA and Surgeon  Anesthesia Plan Comments:        Anesthesia Quick Evaluation

## 2020-06-02 NOTE — Transfer of Care (Signed)
Immediate Anesthesia Transfer of Care Note  Patient: Sandra Carroll  Procedure(s) Performed: CYSTOSCOPY WITH RETROGRADE PYELOGRAM, URETEROSCOPY, BASKET EXTRACTION OF STONES AND STENT EXCHANGE (Right )  Patient Location: PACU  Anesthesia Type:General  Level of Consciousness: awake, alert  and patient cooperative  Airway & Oxygen Therapy: Patient Spontanous Breathing  Post-op Assessment: Report given to RN and Post -op Vital signs reviewed and stable  Post vital signs: Reviewed and stable  Last Vitals:  Vitals Value Taken Time  BP    Temp    Pulse 97 06/02/20 1111  Resp 18 06/02/20 1111  SpO2 97 % 06/02/20 1111  Vitals shown include unvalidated device data.  Last Pain:  Vitals:   06/02/20 0930  PainSc: 8          Complications: No complications documented.

## 2020-06-02 NOTE — Op Note (Signed)
Preoperative diagnosis: Right ureteral stone  Postoperative diagnosis: Same  Procedure: 1 cystoscopy 2 right retrograde pyelography 3.  Intraoperative fluoroscopy, under one hour, with interpretation 4.  Right ureteroscopic stone manipulation with basket extraction 5.  Right 6 x 26 JJ stent exchange  Attending: Cleda Mccreedy  Anesthesia: General  Estimated blood loss: None  Drains: Right 6 x 26 JJ ureteral stent with tether  Specimens: stone for analysis  Antibiotics: rocephin  Findings: multiple right mid ureteral calculi with surrounding ureteral edema. No hydronephrosis in the right collecting system  Indications: Patient is a 54 year old female with a history of ureteral stone who underwent ureteral stent placement for sepsis.  After discussing treatment options, she decided proceed with right ureteroscopic stone manipulation.  Procedure her in detail: The patient was brought to the operating room and a brief timeout was done to ensure correct patient, correct procedure, correct site.  General anesthesia was administered patient was placed in dorsal lithotomy position.  Her genitalia was then prepped and draped in usual sterile fashion.  A rigid 22 French cystoscope was passed in the urethra and the bladder.  Bladder was inspected free masses or lesions.  the right ureteral orifices were in the normal orthotopic locations.  Using a grasper the right ureteral stent was brought to the urethral meatus. A zipwire was advanced through the stent and up to the renal pelvis. The stent was then removed. a 6 french ureteral catheter was then instilled into the right ureter orifice.  a gentle retrograde was obtained and findings noted above.  we then removed the cystoscope and cannulated the right ureteral orifice with a semirigid ureteroscope.  we then encountered multiple stones in the mid ureter.  the stones were then removed with a Ngage basket. Once all stones were removed we then placed  a 6 x 26 double-j ureteral stent over the original zip wire.   We then removed the wire and good coil was noted in the the renal pelvis under fluoroscopy and the bladder under direct vision.    the stones were then removed from the bladder and sent for analysis.   the bladder was then drained and this concluded the procedure which was well tolerated by patient.  Complications: None  Condition: Stable, extubated, transferred to PACU  Plan: Patient is to be discharged home as to follow-up in one week. She is to remove her stent in 72 hours by pulling the tether.

## 2020-06-02 NOTE — Progress Notes (Signed)
Patient educated on opiate use and educational sheet included in discharge papers

## 2020-06-02 NOTE — Interval H&P Note (Signed)
History and Physical Interval Note:  06/02/2020 10:07 AM  Sandra Carroll  has presented today for surgery, with the diagnosis of right ureteral calculus.  The various methods of treatment have been discussed with the patient and family. After consideration of risks, benefits and other options for treatment, the patient has consented to  Procedure(s): CYSTOSCOPY WITH RETROGRADE PYELOGRAM, URETEROSCOPY AND STENT EXCHANGE (Right) HOLMIUM LASER APPLICATION (Right) as a surgical intervention.  The patient's history has been reviewed, patient examined, no change in status, stable for surgery.  I have reviewed the patient's chart and labs.  Questions were answered to the patient's satisfaction.     [name]

## 2020-06-02 NOTE — Anesthesia Postprocedure Evaluation (Signed)
Anesthesia Post Note  Patient: Sandra Carroll  Procedure(s) Performed: CYSTOSCOPY WITH RETROGRADE PYELOGRAM, URETEROSCOPY, BASKET EXTRACTION OF STONES AND STENT EXCHANGE (Right )  Patient location during evaluation: PACU Anesthesia Type: General Level of consciousness: awake and alert Pain management: pain level controlled Vital Signs Assessment: post-procedure vital signs reviewed and stable Respiratory status: spontaneous breathing Cardiovascular status: stable Postop Assessment: no apparent nausea or vomiting Anesthetic complications: no   No complications documented.   Last Vitals:  Vitals:   06/02/20 0930  BP: 126/77  Pulse: 66  Resp: 18  Temp: 36.7 C  SpO2: 98%    Last Pain:  Vitals:   06/02/20 0930  PainSc: 8                  Halena Mohar

## 2020-06-02 NOTE — Anesthesia Procedure Notes (Signed)
Procedure Name: LMA Insertion Date/Time: 06/02/2020 10:31 AM Performed by: Earmon Phoenix, CRNA Pre-anesthesia Checklist: Patient identified, Emergency Drugs available, Suction available, Patient being monitored and Timeout performed Patient Re-evaluated:Patient Re-evaluated prior to induction Oxygen Delivery Method: Circle system utilized Preoxygenation: Pre-oxygenation with 100% oxygen Induction Type: IV induction Ventilation: Mask ventilation without difficulty LMA: LMA inserted LMA Size: 4.0 Placement Confirmation: positive ETCO2,  CO2 detector and breath sounds checked- equal and bilateral Tube secured with: Tape Dental Injury: Teeth and Oropharynx as per pre-operative assessment

## 2020-06-02 NOTE — Discharge Instructions (Signed)
Ureteral Stent Implantation, Care After This sheet gives you information about how to care for yourself after your procedure. Your health care provider may also give you more specific instructions. If you have problems or questions, contact your health care provider. What can I expect after the procedure? After the procedure, it is common to have:  Nausea.  Mild pain when you urinate. You may feel this pain in your lower back or lower abdomen. The pain should stop within a few minutes after you urinate. This may last for up to 1 week.  A small amount of blood in your urine for several days. Follow these instructions at home: Medicines  Take over-the-counter and prescription medicines only as told by your health care provider.  If you were prescribed an antibiotic medicine, take it as told by your health care provider. Do not stop taking the antibiotic even if you start to feel better.  Do not drive for 24 hours if you were given a sedative during your procedure.  Ask your health care provider if the medicine prescribed to you requires you to avoid driving or using heavy machinery. Activity  Rest as told by your health care provider.  Avoid sitting for a long time without moving. Get up to take short walks every 1-2 hours. This is important to improve blood flow and breathing. Ask for help if you feel weak or unsteady.  Return to your normal activities as told by your health care provider. Ask your health care provider what activities are safe for you. General instructions   Watch for any blood in your urine. Call your health care provider if the amount of blood in your urine increases.  If you have a catheter: ? Follow instructions from your health care provider about taking care of your catheter and collection bag. ? Do not take baths, swim, or use a hot tub until your health care provider approves. Ask your health care provider if you may take showers. You may only be allowed to  take sponge baths.  Drink enough fluid to keep your urine pale yellow.  Do not use any products that contain nicotine or tobacco, such as cigarettes, e-cigarettes, and chewing tobacco. These can delay healing after surgery. If you need help quitting, ask your health care provider.  Keep all follow-up visits as told by your health care provider. This is important. Contact a health care provider if:  You have pain that gets worse or does not get better with medicine, especially pain when you urinate.  You have difficulty urinating.  You feel nauseous or you vomit repeatedly during a period of more than 2 days after the procedure. Get help right away if:  Your urine is dark red or has blood clots in it.  You are leaking urine (have incontinence).  The end of the stent comes out of your urethra.  You cannot urinate.  You have sudden, sharp, or severe pain in your abdomen or lower back.  You have a fever.  You have swelling or pain in your legs.  You have difficulty breathing. Summary  After the procedure, it is common to have mild pain when you urinate that goes away within a few minutes after you urinate. This may last for up to 1 week.  Watch for any blood in your urine. Call your health care provider if the amount of blood in your urine increases.  Take over-the-counter and prescription medicines only as told by your health care provider.  Drink   enough fluid to keep your urine pale yellow. This information is not intended to replace advice given to you by your health care provider. Make sure you discuss any questions you have with your health care provider. Document Revised: 03/13/2018 Document Reviewed: 03/14/2018 Elsevier Patient Education  2020 [employer] PLEASE REMOVE YOUR STENT IN 72 HOURS BY GENTLY PULLING THE [facility] Friday 06/05/2020

## 2020-06-03 ENCOUNTER — Encounter (HOSPITAL_COMMUNITY): Payer: Self-pay | Admitting: Urology

## 2020-06-06 LAB — CALCULI, WITH PHOTOGRAPH (CLINICAL LAB)
Calcium Oxalate Dihydrate: 100 %
Weight Calculi: 15 mg

## 2020-06-10 ENCOUNTER — Ambulatory Visit (INDEPENDENT_AMBULATORY_CARE_PROVIDER_SITE_OTHER): Payer: Medicaid Other | Admitting: Urology

## 2020-06-10 ENCOUNTER — Encounter: Payer: Self-pay | Admitting: Urology

## 2020-06-10 ENCOUNTER — Other Ambulatory Visit: Payer: Self-pay

## 2020-06-10 VITALS — BP 144/96 | HR 98 | Temp 97.9°F

## 2020-06-10 DIAGNOSIS — N201 Calculus of ureter: Secondary | ICD-10-CM

## 2020-06-10 LAB — URINALYSIS, ROUTINE W REFLEX MICROSCOPIC
Bilirubin, UA: NEGATIVE
Ketones, UA: NEGATIVE
Nitrite, UA: NEGATIVE
Specific Gravity, UA: 1.015 (ref 1.005–1.030)
Urobilinogen, Ur: 0.2 mg/dL (ref 0.2–1.0)
pH, UA: 7 (ref 5.0–7.5)

## 2020-06-10 LAB — MICROSCOPIC EXAMINATION: Renal Epithel, UA: NONE SEEN /hpf

## 2020-06-10 NOTE — Progress Notes (Signed)
Urological Symptom Review  Patient is experiencing the following symptoms: none   Review of Systems  Gastrointestinal (upper)  : Negative for upper GI symptoms  Gastrointestinal (lower) : Negative for lower GI symptoms  Constitutional : Negative for symptoms  Skin: Negative for skin symptoms  Eyes: Negative for eye symptoms  Ear/Nose/Throat : Negative for Ear/Nose/Throat symptoms  Hematologic/Lymphatic: Negative for Hematologic/Lymphatic symptoms  Cardiovascular : Negative for cardiovascular symptoms  Respiratory : Negative for respiratory symptoms  Endocrine: Negative for endocrine symptoms  Musculoskeletal: Back pain  Neurological: Negative for neurological symptoms  Psychologic: Anxiety  

## 2020-06-10 NOTE — Patient Instructions (Signed)
Dietary Guidelines to Help Prevent Kidney Stones Kidney stones are deposits of minerals and salts that form inside your kidneys. Your risk of developing kidney stones may be greater depending on your diet, your lifestyle, the medicines you take, and whether you have certain medical conditions. Most people can reduce their chances of developing kidney stones by following the instructions below. Depending on your overall health and the type of kidney stones you tend to develop, your dietitian may give you more specific instructions. What are tips for following this plan? Reading food labels  Choose foods with "no salt added" or "low-salt" labels. Limit your sodium intake to less than 1500 mg per day.  Choose foods with calcium for each meal and snack. Try to eat about 300 mg of calcium at each meal. Foods that contain 200-500 mg of calcium per serving include: ? 8 oz (237 ml) of milk, fortified nondairy milk, and fortified fruit juice. ? 8 oz (237 ml) of kefir, yogurt, and soy yogurt. ? 4 oz (118 ml) of tofu. ? 1 oz of cheese. ? 1 cup (300 g) of dried figs. ? 1 cup (91 g) of cooked broccoli. ? 1-3 oz can of sardines or mackerel.  Most people need 1000 to 1500 mg of calcium each day. Talk to your dietitian about how much calcium is recommended for you. Shopping  Buy plenty of fresh fruits and vegetables. Most people do not need to avoid fruits and vegetables, even if they contain nutrients that may contribute to kidney stones.  When shopping for convenience foods, choose: ? Whole pieces of fruit. ? Premade salads with dressing on the side. ? Low-fat fruit and yogurt smoothies.  Avoid buying frozen meals or prepared deli foods.  Look for foods with live cultures, such as yogurt and kefir. Cooking  Do not add salt to food when cooking. Place a salt shaker on the table and allow each person to add his or her own salt to taste.  Use vegetable protein, such as beans, textured vegetable  protein (TVP), or tofu instead of meat in pasta, casseroles, and soups. Meal planning   Eat less salt, if told by your dietitian. To do this: ? Avoid eating processed or premade food. ? Avoid eating fast food.  Eat less animal protein, including cheese, meat, poultry, or fish, if told by your dietitian. To do this: ? Limit the number of times you have meat, poultry, fish, or cheese each week. Eat a diet free of meat at least 2 days a week. ? Eat only one serving each day of meat, poultry, fish, or seafood. ? When you prepare animal protein, cut pieces into small portion sizes. For most meat and fish, one serving is about the size of one deck of cards.  Eat at least 5 servings of fresh fruits and vegetables each day. To do this: ? Keep fruits and vegetables on hand for snacks. ? Eat 1 piece of fruit or a handful of berries with breakfast. ? Have a salad and fruit at lunch. ? Have two kinds of vegetables at dinner.  Limit foods that are high in a substance called oxalate. These include: ? Spinach. ? Rhubarb. ? Beets. ? Potato chips and french fries. ? Nuts.  If you regularly take a diuretic medicine, make sure to eat at least 1-2 fruits or vegetables high in potassium each day. These include: ? Avocado. ? Banana. ? Orange, prune, carrot, or tomato juice. ? Baked potato. ? Cabbage. ? Beans and split   peas. General instructions   Drink enough fluid to keep your urine clear or pale yellow. This is the most important thing you can do.  Talk to your health care provider and dietitian about taking daily supplements. Depending on your health and the cause of your kidney stones, you may be advised: ? Not to take supplements with vitamin C. ? To take a calcium supplement. ? To take a daily probiotic supplement. ? To take other supplements such as magnesium, fish oil, or vitamin B6.  Take all medicines and supplements as told by your health care provider.  Limit alcohol intake to no  more than 1 drink a day for nonpregnant women and 2 drinks a day for men. One drink equals 12 oz of beer, 5 oz of wine, or 1 oz of hard liquor.  Lose weight if told by your health care provider. Work with your dietitian to find strategies and an eating plan that works best for you. What foods are not recommended? Limit your intake of the following foods, or as told by your dietitian. Talk to your dietitian about specific foods you should avoid based on the type of kidney stones and your overall health. Grains Breads. Bagels. Rolls. Baked goods. Salted crackers. Cereal. Pasta. Vegetables Spinach. Rhubarb. Beets. Canned vegetables. Pickles. Olives. Meats and other protein foods Nuts. Nut butters. Large portions of meat, poultry, or fish. Salted or cured meats. Deli meats. Hot dogs. Sausages. Dairy Cheese. Beverages Regular soft drinks. Regular vegetable juice. Seasonings and other foods Seasoning blends with salt. Salad dressings. Canned soups. Soy sauce. Ketchup. Barbecue sauce. Canned pasta sauce. Casseroles. Pizza. Lasagna. Frozen meals. Potato chips. French fries. Summary  You can reduce your risk of kidney stones by making changes to your diet.  The most important thing you can do is drink enough fluid. You should drink enough fluid to keep your urine clear or pale yellow.  Ask your health care provider or dietitian how much protein from animal sources you should eat each day, and also how much salt and calcium you should have each day. This information is not intended to replace advice given to you by your health care provider. Make sure you discuss any questions you have with your health care provider. Document Revised: 09/26/2018 Document Reviewed: 05/17/2016 Elsevier Patient Education  2020 [employer]  

## 2020-06-10 NOTE — Progress Notes (Signed)
06/10/2020 2:03 PM   Sandra Carroll February 15, 1966 [id number]  Referring provider: Remus Loffler, PA-C [address],  [place] [address]  followup nephrolithiasis  HPI: Sandra Carroll is a 54yo here for followup for nephrolithiasis. She underwent right ureteroscopic stone extraction last week. She removed her stent post day 3. No flank pain. No LUTS. She has had over 10 stone events.    PMH: Past Medical History:  Diagnosis Date  . Anxiety   . Chronic back pain   . Complication of anesthesia 1991 and 1998   childbirth --reaction to anesthesia was itching.   . DDD (degenerative disc disease)   . Depression   . Headache    migraines  . Hemorrhoids   . History of kidney stones   . Hyperlipidemia   . Hypertension   . Kidney stone   . PONV (postoperative nausea and vomiting)     Surgical History: Past Surgical History:  Procedure Laterality Date  . ADENOIDECTOMY  1978  . CESAREAN SECTION  1991 1998   pt had itching with 1991 c-section.   . CHOLECYSTECTOMY    . COLONOSCOPY N/A 03/01/2013   Dr. Jena Gauss- normal rectum except for anal canal/internal hemorrhoidal tags. colonic mucosa appeared normal.  . CYSTOSCOPY W/ URETERAL STENT PLACEMENT Left 12/09/2019   Procedure: CYSTOSCOPY WITH RETROGRADE PYELOGRAM/URETERAL STENT PLACEMENT;  Surgeon: Bjorn Pippin, MD;  Location: [facility];  Service: Urology;  Laterality: Left;  . CYSTOSCOPY W/ URETERAL STENT PLACEMENT Right 05/07/2020   Procedure: CYSTOSCOPY WITH RETROGRADE PYELOGRAM/URETERAL STENT PLACEMENT;  Surgeon: Jerilee Field, MD;  Location: [facility];  Service: Urology;  Laterality: Right;  . CYSTOSCOPY WITH RETROGRADE PYELOGRAM, URETEROSCOPY AND STENT PLACEMENT Right 06/02/2020   Procedure: CYSTOSCOPY WITH RETROGRADE PYELOGRAM, URETEROSCOPY, BASKET EXTRACTION OF STONES AND STENT EXCHANGE;  Surgeon: Malen Gauze, MD;  Location: [facility];  Service: Urology;  Laterality: Right;  . CYSTOSCOPY/URETEROSCOPY/HOLMIUM LASER/STENT PLACEMENT  Left 12/17/2019   Procedure: LEFT URETEROSCOPY/STENT EXCHANGE/ EXTRACTION OF LEFT URETERAL CALCULI;  Surgeon: Bjorn Pippin, MD;  Location: [facility];  Service: Urology;  Laterality: Left;  . KNEE ARTHROSCOPY Right 2003  . LITHOTRIPSY    . LUMBAR LAMINECTOMY/DECOMPRESSION MICRODISCECTOMY  03/02/2012   Procedure: LUMBAR LAMINECTOMY/DECOMPRESSION MICRODISCECTOMY 1 LEVEL;  Surgeon: Carmela Hurt, MD;  Location: [facility];  Service: Neurosurgery;  Laterality: Left;  LEFT Lumbar Five-Sacral One Laminectomy for Decompression     Home Medications:  Allergies as of 06/10/2020   No Known Allergies     Medication List       Accurate as of June 10, 2020  2:03 PM. If you have any questions, ask your nurse or doctor.        citalopram 20 MG tablet Commonly known as: CELEXA Take 1 tablet (20 mg total) by mouth daily.   desogestrel-ethinyl estradiol 0.15-0.02/0.01 MG (21/5) tablet Commonly known as: Viorele Take 1 tablet by mouth daily.   lisinopril 20 MG tablet Commonly known as: ZESTRIL Take 1 tablet (20 mg total) by mouth daily.   oxyCODONE-acetaminophen 5-325 MG tablet Commonly known as: Percocet Take 1 tablet by mouth every 4 (four) hours as needed for moderate pain.   simvastatin 20 MG tablet Commonly known as: ZOCOR Take 1 tablet (20 mg total) by mouth every morning.       Allergies: No Known Allergies  Family History: Family History  Problem Relation Age of Onset  . Hypertension Mother   . Cancer Father   . Hypertension Father   . Colon cancer Neg  Hx     Social History:  reports that she quit smoking about 11 years ago. Her smoking use included cigarettes. She has a 7.50 pack-year smoking history. She has never used smokeless tobacco. She reports current alcohol use. She reports that she does not use drugs.  ROS: All other review of systems were reviewed and are negative except what is noted above in HPI  Physical Exam: BP (!) 144/96   Pulse 98   Temp 97.9 F  (36.6 C)   LMP 10/17/2019 (Approximate)   Constitutional:  Alert and oriented, No acute distress. HEENT: [place] AT, moist mucus membranes.  Trachea midline, no masses. Cardiovascular: No clubbing, cyanosis, or edema. Respiratory: Normal respiratory effort, no increased work of breathing. GI: Abdomen is soft, nontender, nondistended, no abdominal masses GU: No CVA tenderness.  Lymph: No cervical or inguinal lymphadenopathy. Skin: No rashes, bruises or suspicious lesions. Neurologic: Grossly intact, no focal deficits, moving all 4 extremities. Psychiatric: Normal mood and affect.  Laboratory Data: Lab Results  Component Value Date   WBC 10.2 05/09/2020   HGB 9.2 (L) 05/09/2020   HCT 28.5 (L) 05/09/2020   MCV 95.6 05/09/2020   PLT 234 05/09/2020    Lab Results  Component Value Date   CREATININE 1.28 (H) 05/09/2020    No results found for: PSA  No results found for: TESTOSTERONE  No results found for: HGBA1C  Urinalysis    Component Value Date/Time   COLORURINE YELLOW 05/07/2020 1233   APPEARANCEUR Cloudy (A) 05/20/2020 1615   LABSPEC 1.013 05/07/2020 1233   PHURINE 6.0 05/07/2020 1233   GLUCOSEU Negative 05/20/2020 1615   HGBUR SMALL (A) 05/07/2020 1233   BILIRUBINUR Negative 05/20/2020 1615   KETONESUR NEGATIVE 05/07/2020 1233   PROTEINUR 2+ (A) 05/20/2020 1615   PROTEINUR 100 (A) 05/07/2020 1233   UROBILINOGEN 0.2 07/25/2014 0215   NITRITE Negative 05/20/2020 1615   NITRITE NEGATIVE 05/07/2020 1233   LEUKOCYTESUR 1+ (A) 05/20/2020 1615   LEUKOCYTESUR LARGE (A) 05/07/2020 1233    Lab Results  Component Value Date   LABMICR See below: 05/20/2020   WBCUA 6-10 (A) 05/20/2020   LABEPIT >10 (A) 05/20/2020   BACTERIA Moderate (A) 05/20/2020    Pertinent Imaging:  No results found for this or any previous visit.  No results found for this or any previous visit.  No results found for this or any previous visit.  No results found for this or any previous  visit.  Results for orders placed during the hospital encounter of 05/03/18  US Renal  Narrative CLINICAL DATA:  Left flank pain  EXAM: RENAL / URINARY TRACT ULTRASOUND COMPLETE  COMPARISON:  CT 12/09/2017  FINDINGS: Right Kidney:  Renal measurements: 11.1 x 6.2 x 6 cm = volume: 222.6 mL . Echogenicity within normal limits. Moderate right-sided hydronephrosis with intrarenal calculi the largest is interpolar at 4.2 mm. No apparent renal mass.  Left Kidney:  Renal measurements: 11.8 x 6.1 x 5.2 cm = volume: 98 mL. Echogenicity within normal limits. No mass. Marked hydronephrosis with a 3 mm upper pole renal calculus noted.  Bladder:  Under distended in appearance without focal mural thickening. No demonstrable ureteral jets are noted suggesting obstruction.  IMPRESSION: Moderate right and marked left dilatation of the renal collecting systems compatible with hydronephrosis with bilateral intrarenal calculi. No demonstrable ureteral jets to suggest patency of the ureters. Obstruction of the ureters is therefore raised possibly from ureteric calculi though the possibility of stricture, ureteral lesions, extrinsic mass effect on  the ureters or retroperitoneal fibrosis accounting for this appearance are not entirely excluded. Consider CT for further assessment.   Electronically Signed By: Tollie Eth M.D. On: 05/03/2018 14:53  No results found for this or any previous visit.  No results found for this or any previous visit.  Results for orders placed during the hospital encounter of 05/07/20  CT Renal Stone Study  Narrative CLINICAL DATA:  Right-sided lower back pain  EXAM: CT ABDOMEN AND PELVIS WITHOUT CONTRAST  TECHNIQUE: Multidetector CT imaging of the abdomen and pelvis was performed following the standard protocol without IV contrast.  COMPARISON:  None.  FINDINGS: Lower chest: The visualized heart size within normal limits. No pericardial  fluid/thickening.  No hiatal hernia.  The visualized portions of the lungs are clear.  Hepatobiliary: Although limited due to the lack of intravenous contrast, normal in appearance without gross focal abnormality. The patient is status post cholecystectomy. No biliary ductal dilation.  Pancreas:  Unremarkable.  No surrounding inflammatory changes.  Spleen: Normal in size. Although limited due to the lack of intravenous contrast, normal in appearance.  Adrenals/Urinary Tract: Both adrenal glands appear normal. Moderate right pelvicaliectasis and ureterectasis is seen down the level of the distal ureter where there is several ureteral calculi 1 measuring 3 mm and 1 measuring 4 mm. There is right-sided perinephric and proximal periureteral stranding changes. There is a tiny punctate calcification in the posterior right bladder. There is also a a 3 mm calculus seen in the mid pole the right kidney. No left-sided renal or collecting system calculi are noted.  Stomach/Bowel: The stomach, small bowel, and colon are normal in appearance. No inflammatory changes or obstructive findings. Scattered colonic diverticula are noted.  Vascular/Lymphatic: There are no enlarged abdominal or pelvic lymph nodes. Scattered aortic atherosclerotic calcifications are seen without aneurysmal dilatation.  Reproductive: The uterus and adnexa are unremarkable.  Other: Bilateral fat containing inguinal hernias are noted.  Musculoskeletal: No acute or significant osseous findings.  IMPRESSION: Moderate right hydronephrosis to the distal ureter where there is several calcified ureteral calculi measuring 3 mm and 4 mm. There is also punctate calcification in the posterior right bladder.  Right-sided perinephric and periureteral stranding which could be from the recently passed stone versus pyelonephritis.  Nonobstructing right renal calculus.   Electronically Signed By: Jonna Clark M.D. On:  05/07/2020 16:09   Assessment & Plan:    1. Nephrolithiasis -We have elected to proceed with a metabolic stone evaluation -RTC 1 month with renal US - Urinalysis, Routine w reflex microscopic   No follow-ups on file.  [name], MD  [facility] [place]

## 2020-06-11 LAB — BASIC METABOLIC PANEL
BUN/Creatinine Ratio: 8 — ABNORMAL LOW (ref 9–23)
BUN: 9 mg/dL (ref 6–24)
CO2: 22 mmol/L (ref 20–29)
Calcium: 10 mg/dL (ref 8.7–10.2)
Chloride: 107 mmol/L — ABNORMAL HIGH (ref 96–106)
Creatinine, Ser: 1.06 mg/dL — ABNORMAL HIGH (ref 0.57–1.00)
GFR calc Af Amer: 70 mL/min/{1.73_m2} (ref >59)
GFR calc non Af Amer: 61 mL/min/{1.73_m2} (ref >59)
Glucose: 117 mg/dL — ABNORMAL HIGH (ref 65–99)
Potassium: 3.5 mmol/L (ref 3.5–5.2)
Sodium: 142 mmol/L (ref 134–144)

## 2020-06-11 LAB — URIC ACID: Uric Acid: 3.9 mg/dL (ref 3.0–7.2)

## 2020-06-11 LAB — PTH, INTACT AND CALCIUM
Calcium: 9.9 mg/dL (ref 8.7–10.2)
PTH: 43 pg/mL (ref 15–65)

## 2020-06-16 ENCOUNTER — Telehealth: Payer: Self-pay

## 2020-06-16 NOTE — Telephone Encounter (Signed)
Left message for pt to please call office tomorrow.

## 2020-06-16 NOTE — Telephone Encounter (Signed)
-----   Message from Malen Gauze, MD sent at 06/16/2020 11:19 AM EST ----- Labs normal ----- Message ----- From: Dalia Heading, LPN Sent: 31/49/7026  10:08 AM EST To: Malen Gauze, MD  Pls review.

## 2020-06-17 NOTE — Telephone Encounter (Signed)
Pt notified of lab results per Dr. Macario Carls note.

## 2020-06-17 NOTE — Telephone Encounter (Signed)
-----   Message from Patrick L McKenzie, MD sent at 06/16/2020 11:19 AM EST ----- Labs normal ----- Message ----- From: Dominyck Reser, LPN Sent: 06/11/2020  10:08 AM EST To: Patrick L McKenzie, MD  Pls review.  

## 2020-07-01 ENCOUNTER — Ambulatory Visit (HOSPITAL_COMMUNITY)
Admission: RE | Admit: 2020-07-01 | Discharge: 2020-07-01 | Disposition: A | Discharge status: Home / Self Care | Payer: PRIVATE HEALTH INSURANCE | Source: Ambulatory Visit | Attending: Urology | Admitting: Urology

## 2020-07-01 ENCOUNTER — Other Ambulatory Visit: Payer: Self-pay

## 2020-07-01 DIAGNOSIS — N201 Calculus of ureter: Secondary | ICD-10-CM | POA: Insufficient documentation

## 2020-07-08 ENCOUNTER — Other Ambulatory Visit: Payer: Self-pay

## 2020-07-08 ENCOUNTER — Encounter: Payer: Self-pay | Admitting: Urology

## 2020-07-08 ENCOUNTER — Ambulatory Visit (INDEPENDENT_AMBULATORY_CARE_PROVIDER_SITE_OTHER): Payer: PRIVATE HEALTH INSURANCE | Admitting: Urology

## 2020-07-08 VITALS — BP 111/76 | HR 91 | Temp 97.6°F | Ht 62.0 in | Wt 200.0 lb

## 2020-07-08 DIAGNOSIS — N201 Calculus of ureter: Secondary | ICD-10-CM | POA: Diagnosis not present

## 2020-07-08 LAB — URINALYSIS, ROUTINE W REFLEX MICROSCOPIC
Bilirubin, UA: NEGATIVE
Glucose, UA: NEGATIVE
Ketones, UA: NEGATIVE
Leukocytes,UA: NEGATIVE
Nitrite, UA: NEGATIVE
Protein,UA: NEGATIVE
RBC, UA: NEGATIVE
Specific Gravity, UA: 1.025 (ref 1.005–1.030)
Urobilinogen, Ur: 0.2 mg/dL (ref 0.2–1.0)
pH, UA: 5.5 (ref 5.0–7.5)

## 2020-07-08 NOTE — Progress Notes (Signed)
07/08/2020 2:33 PM   Sandra Carroll 1966-03-25 [id number]  Referring provider: Remus Loffler, PA-C [address]  [place] [address]  followup nephrolithiasis  HPI: Sandra Carroll is a 54yo here for followup for nephrolithiasis. No stone events since last visit. Reanl US shows possible 64mm rigth lower pole calculus. PTH, uric acid, and BMP normal. She completed her 24 hour urine last week. Results are not available. NO flank pain.    PMH: Past Medical History:  Diagnosis Date  . Anxiety   . Chronic back pain   . Complication of anesthesia 1991 and 1998   childbirth --reaction to anesthesia was itching.   . DDD (degenerative disc disease)   . Depression   . Headache    migraines  . Hemorrhoids   . History of kidney stones   . Hyperlipidemia   . Hypertension   . Kidney stone   . PONV (postoperative nausea and vomiting)     Surgical History: Past Surgical History:  Procedure Laterality Date  . ADENOIDECTOMY  1978  . CESAREAN SECTION  1991 1998   pt had itching with 1991 c-section.   . CHOLECYSTECTOMY    . COLONOSCOPY N/A 03/01/2013   Dr. Jena Gauss- normal rectum except for anal canal/internal hemorrhoidal tags. colonic mucosa appeared normal.  . CYSTOSCOPY W/ URETERAL STENT PLACEMENT Left 12/09/2019   Procedure: CYSTOSCOPY WITH RETROGRADE PYELOGRAM/URETERAL STENT PLACEMENT;  Surgeon: Bjorn Pippin, MD;  Location: [facility];  Service: Urology;  Laterality: Left;  . CYSTOSCOPY W/ URETERAL STENT PLACEMENT Right 05/07/2020   Procedure: CYSTOSCOPY WITH RETROGRADE PYELOGRAM/URETERAL STENT PLACEMENT;  Surgeon: Jerilee Field, MD;  Location: [facility];  Service: Urology;  Laterality: Right;  . CYSTOSCOPY WITH RETROGRADE PYELOGRAM, URETEROSCOPY AND STENT PLACEMENT Right 06/02/2020   Procedure: CYSTOSCOPY WITH RETROGRADE PYELOGRAM, URETEROSCOPY, BASKET EXTRACTION OF STONES AND STENT EXCHANGE;  Surgeon: Malen Gauze, MD;  Location: [facility];  Service: Urology;  Laterality: Right;  .  CYSTOSCOPY/URETEROSCOPY/HOLMIUM LASER/STENT PLACEMENT Left 12/17/2019   Procedure: LEFT URETEROSCOPY/STENT EXCHANGE/ EXTRACTION OF LEFT URETERAL CALCULI;  Surgeon: Bjorn Pippin, MD;  Location: [facility];  Service: Urology;  Laterality: Left;  . KNEE ARTHROSCOPY Right 2003  . LITHOTRIPSY    . LUMBAR LAMINECTOMY/DECOMPRESSION MICRODISCECTOMY  03/02/2012   Procedure: LUMBAR LAMINECTOMY/DECOMPRESSION MICRODISCECTOMY 1 LEVEL;  Surgeon: Carmela Hurt, MD;  Location: [facility];  Service: Neurosurgery;  Laterality: Left;  LEFT Lumbar Five-Sacral One Laminectomy for Decompression     Home Medications:  Allergies as of 07/08/2020   No Known Allergies     Medication List       Accurate as of July 08, 2020  2:33 PM. If you have any questions, ask your nurse or doctor.        citalopram 20 MG tablet Commonly known as: CELEXA Take 1 tablet (20 mg total) by mouth daily.   desogestrel-ethinyl estradiol 0.15-0.02/0.01 MG (21/5) tablet Commonly known as: Viorele Take 1 tablet by mouth daily.   lisinopril 20 MG tablet Commonly known as: ZESTRIL Take 1 tablet (20 mg total) by mouth daily.   oxyCODONE-acetaminophen 5-325 MG tablet Commonly known as: Percocet Take 1 tablet by mouth every 4 (four) hours as needed for moderate pain.   simvastatin 20 MG tablet Commonly known as: ZOCOR Take 1 tablet (20 mg total) by mouth every morning.       Allergies: No Known Allergies  Family History: Family History  Problem Relation Age of Onset  . Hypertension Mother   . Cancer Father   .  Hypertension Father   . Colon cancer Neg Hx     Social History:  reports that she quit smoking about 12 years ago. Her smoking use included cigarettes. She has a 7.50 pack-year smoking history. She has never used smokeless tobacco. She reports current alcohol use. She reports that she does not use drugs.  ROS: All other review of systems were reviewed and are negative except what is noted above in  HPI  Physical Exam: BP 111/76   Pulse 91   Temp 97.6 F (36.4 C)   Ht 5\' 2"  (1.575 m)   Wt 200 lb (90.7 kg)   LMP 10/17/2019 (Approximate)   BMI 36.58 kg/m   Constitutional:  Alert and oriented, No acute distress. HEENT: [place] AT, moist mucus membranes.  Trachea midline, no masses. Cardiovascular: No clubbing, cyanosis, or edema. Respiratory: Normal respiratory effort, no increased work of breathing. GI: Abdomen is soft, nontender, nondistended, no abdominal masses GU: No CVA tenderness.  Lymph: No cervical or inguinal lymphadenopathy. Skin: No rashes, bruises or suspicious lesions. Neurologic: Grossly intact, no focal deficits, moving all 4 extremities. Psychiatric: Normal mood and affect.  Laboratory Data: Lab Results  Component Value Date   WBC 10.2 05/09/2020   HGB 9.2 (L) 05/09/2020   HCT 28.5 (L) 05/09/2020   MCV 95.6 05/09/2020   PLT 234 05/09/2020    Lab Results  Component Value Date   CREATININE 1.06 (H) 06/10/2020    No results found for: PSA  No results found for: TESTOSTERONE  No results found for: HGBA1C  Urinalysis    Component Value Date/Time   COLORURINE YELLOW 05/07/2020 1233   APPEARANCEUR Clear 06/10/2020 1412   LABSPEC 1.013 05/07/2020 1233   PHURINE 6.0 05/07/2020 1233   GLUCOSEU Trace (A) 06/10/2020 1412   HGBUR SMALL (A) 05/07/2020 1233   BILIRUBINUR Negative 06/10/2020 1412   KETONESUR NEGATIVE 05/07/2020 1233   PROTEINUR Trace (A) 06/10/2020 1412   PROTEINUR 100 (A) 05/07/2020 1233   UROBILINOGEN 0.2 07/25/2014 0215   NITRITE Negative 06/10/2020 1412   NITRITE NEGATIVE 05/07/2020 1233   LEUKOCYTESUR Trace (A) 06/10/2020 1412   LEUKOCYTESUR LARGE (A) 05/07/2020 1233    Lab Results  Component Value Date   LABMICR See below: 06/10/2020   WBCUA 6-10 (A) 06/10/2020   LABEPIT 0-10 06/10/2020   MUCUS Present 06/10/2020   BACTERIA Moderate (A) 06/10/2020    Pertinent Imaging: Renal 06/12/2020 07/01/2020: Images reviewed and discussed with  the patient No results found for this or any previous visit.  No results found for this or any previous visit.  No results found for this or any previous visit.  No results found for this or any previous visit.  Results for orders placed during the hospital encounter of 07/01/20  Ultrasound renal complete  Narrative CLINICAL DATA:  History of multiple renal calculi and right ureteral calc.  EXAM: RENAL / URINARY TRACT ULTRASOUND COMPLETE  COMPARISON:  CT abdomen pelvis 05/07/2020  FINDINGS: Right Kidney:  Renal measurements: 11.2 x 6.6 x 6.0 cm = volume: 230 mL. Echogenicity within normal limits. There are a few right renal calculi the largest of which measures 5 mm. No mass or hydronephrosis visualized.  Left Kidney:  Renal measurements: 10.4 x 5.5 x 5.3 cm = volume: 158 mL. Echogenicity within normal limits. No mass or hydronephrosis visualized.  Bladder:  Appears normal for degree of bladder distention.  Other:  Bilateral ureteral jets are visualized.  IMPRESSION: 1. Nonobstructing right renal calculi measuring up to 5 mm.  2. No hydronephrosis. Bilateral ureteral jets are visualized.   Electronically Signed By: Maudry Mayhew MD On: 07/01/2020 19:54  No results found for this or any previous visit.  No results found for this or any previous visit.  Results for orders placed during the hospital encounter of 05/07/20  CT Renal Stone Study  Narrative CLINICAL DATA:  Right-sided lower back pain  EXAM: CT ABDOMEN AND PELVIS WITHOUT CONTRAST  TECHNIQUE: Multidetector CT imaging of the abdomen and pelvis was performed following the standard protocol without IV contrast.  COMPARISON:  None.  FINDINGS: Lower chest: The visualized heart size within normal limits. No pericardial fluid/thickening.  No hiatal hernia.  The visualized portions of the lungs are clear.  Hepatobiliary: Although limited due to the lack of intravenous contrast, normal  in appearance without gross focal abnormality. The patient is status post cholecystectomy. No biliary ductal dilation.  Pancreas:  Unremarkable.  No surrounding inflammatory changes.  Spleen: Normal in size. Although limited due to the lack of intravenous contrast, normal in appearance.  Adrenals/Urinary Tract: Both adrenal glands appear normal. Moderate right pelvicaliectasis and ureterectasis is seen down the level of the distal ureter where there is several ureteral calculi 1 measuring 3 mm and 1 measuring 4 mm. There is right-sided perinephric and proximal periureteral stranding changes. There is a tiny punctate calcification in the posterior right bladder. There is also a a 3 mm calculus seen in the mid pole the right kidney. No left-sided renal or collecting system calculi are noted.  Stomach/Bowel: The stomach, small bowel, and colon are normal in appearance. No inflammatory changes or obstructive findings. Scattered colonic diverticula are noted.  Vascular/Lymphatic: There are no enlarged abdominal or pelvic lymph nodes. Scattered aortic atherosclerotic calcifications are seen without aneurysmal dilatation.  Reproductive: The uterus and adnexa are unremarkable.  Other: Bilateral fat containing inguinal hernias are noted.  Musculoskeletal: No acute or significant osseous findings.  IMPRESSION: Moderate right hydronephrosis to the distal ureter where there is several calcified ureteral calculi measuring 3 mm and 4 mm. There is also punctate calcification in the posterior right bladder.  Right-sided perinephric and periureteral stranding which could be from the recently passed stone versus pyelonephritis.  Nonobstructing right renal calculus.   Electronically Signed By: Jonna Clark M.D. On: 05/07/2020 16:09   Assessment & Plan:    1. Right ureteral calculus -We discussed the management of kidney stones. These options include observation, ureteroscopy, shockwave  lithotripsy (ESWL) and percutaneous nephrolithotomy (PCNL). We discussed which options are relevant to the patient's stone(s). We discussed the natural history of kidney stones as well as the complications of untreated stones and the impact on quality of life without treatment as well as with each of the above listed treatments. We also discussed the efficacy of each treatment in its ability to clear the stone burden. With any of these management options I discussed the signs and symptoms of infection and the need for emergent treatment should these be experienced. For each option we discussed the ability of each procedure to clear the patient of their stone burden.   For observation I described the risks which include but are not limited to silent renal damage, life-threatening infection, need for emergent surgery, failure to pass stone and pain.   For ureteroscopy I described the risks which include bleeding, infection, damage to contiguous structures, positioning injury, ureteral stricture, ureteral avulsion, ureteral injury, need for prolonged ureteral stent, inability to perform ureteroscopy, need for an interval procedure, inability to clear stone  burden, stent discomfort/pain, heart attack, stroke, pulmonary embolus and the inherent risks with general anesthesia.   For shockwave lithotripsy I described the risks which include arrhythmia, kidney contusion, kidney hemorrhage, need for transfusion, pain, inability to adequately break up stone, inability to pass stone fragments, Steinstrasse, infection associated with obstructing stones, need for alternate surgical procedure, need for repeat shockwave lithotripsy, MI, CVA, PE and the inherent risks with anesthesia/conscious sedation.   For PCNL I described the risks including positioning injury, pneumothorax, hydrothorax, need for chest tube, inability to clear stone burden, renal laceration, arterial venous fistula or malformation, need for embolization  of kidney, loss of kidney or renal function, need for repeat procedure, need for prolonged nephrostomy tube, ureteral avulsion, MI, CVA, PE and the inherent risks of general anesthesia.   - The patient would like to proceed with observation. We will call with her 24 hour urine results. RTC 3 months with renal US - Urinalysis, Routine w reflex microscopic   No follow-ups on file.  Wilkie AyePatrick Stacie Knutzen, MD  [facility]

## 2020-07-08 NOTE — Progress Notes (Signed)
Urological Symptom Review  Patient is experiencing the following symptoms: Get up at night to urinate Kidney stones  Review of Systems  Gastrointestinal (upper)  : Negative for upper GI symptoms  Gastrointestinal (lower) : Negative for lower GI symptoms  Constitutional : Negative for symptoms  Skin: Negative for skin symptoms  Eyes: Negative for eye symptoms  Ear/Nose/Throat : Sinus problems  Hematologic/Lymphatic: negative  Cardiovascular : Negative for cardiovascular symptoms  Respiratory : Negative for respiratory symptoms  Endocrine: Negative for endocrine symptoms  Musculoskeletal: hip pain  Neurological: Headaches  Psychologic: Negative for psychiatric symptoms

## 2020-07-08 NOTE — Patient Instructions (Signed)

## 2020-07-14 ENCOUNTER — Other Ambulatory Visit: Payer: Self-pay

## 2020-07-14 ENCOUNTER — Encounter: Payer: Self-pay | Admitting: Family Medicine

## 2020-07-14 ENCOUNTER — Ambulatory Visit (INDEPENDENT_AMBULATORY_CARE_PROVIDER_SITE_OTHER): Payer: PRIVATE HEALTH INSURANCE | Admitting: Family Medicine

## 2020-07-14 VITALS — BP 139/90 | HR 117 | Temp 98.1°F | Ht 62.0 in | Wt 206.2 lb

## 2020-07-14 DIAGNOSIS — D649 Anemia, unspecified: Secondary | ICD-10-CM

## 2020-07-14 DIAGNOSIS — J039 Acute tonsillitis, unspecified: Secondary | ICD-10-CM

## 2020-07-14 DIAGNOSIS — Z30011 Encounter for initial prescription of contraceptive pills: Secondary | ICD-10-CM

## 2020-07-14 DIAGNOSIS — Z87442 Personal history of urinary calculi: Secondary | ICD-10-CM

## 2020-07-14 DIAGNOSIS — E7800 Pure hypercholesterolemia, unspecified: Secondary | ICD-10-CM

## 2020-07-14 DIAGNOSIS — F419 Anxiety disorder, unspecified: Secondary | ICD-10-CM | POA: Diagnosis not present

## 2020-07-14 DIAGNOSIS — J36 Peritonsillar abscess: Secondary | ICD-10-CM

## 2020-07-14 DIAGNOSIS — I10 Essential (primary) hypertension: Secondary | ICD-10-CM

## 2020-07-14 DIAGNOSIS — R42 Dizziness and giddiness: Secondary | ICD-10-CM

## 2020-07-14 DIAGNOSIS — F320 Major depressive disorder, single episode, mild: Secondary | ICD-10-CM | POA: Diagnosis not present

## 2020-07-14 DIAGNOSIS — M5431 Sciatica, right side: Secondary | ICD-10-CM

## 2020-07-14 DIAGNOSIS — M5136 Other intervertebral disc degeneration, lumbar region: Secondary | ICD-10-CM

## 2020-07-14 DIAGNOSIS — N938 Other specified abnormal uterine and vaginal bleeding: Secondary | ICD-10-CM

## 2020-07-14 DIAGNOSIS — M51369 Other intervertebral disc degeneration, lumbar region without mention of lumbar back pain or lower extremity pain: Secondary | ICD-10-CM

## 2020-07-14 LAB — PREGNANCY, URINE: Preg Test, Ur: NEGATIVE

## 2020-07-14 MED ORDER — DESOGESTREL-ETHINYL ESTRADIOL 0.15-0.02/0.01 MG (21/5) PO TABS: 1.0000 | ORAL_TABLET | Freq: Every day | ORAL | 3 refills | Status: DC

## 2020-07-14 MED ORDER — DICLOFENAC SODIUM 75 MG PO TBEC: 75.0000 mg | DELAYED_RELEASE_TABLET | Freq: Two times a day (BID) | ORAL | 2 refills | Status: DC

## 2020-07-14 MED ORDER — LISINOPRIL 20 MG PO TABS: 20.0000 mg | ORAL_TABLET | Freq: Every day | ORAL | 1 refills | Status: DC

## 2020-07-14 MED ORDER — AMOXICILLIN-POT CLAVULANATE 875-125 MG PO TABS: 1.0000 | ORAL_TABLET | Freq: Two times a day (BID) | ORAL | 0 refills | Status: AC

## 2020-07-14 MED ORDER — SIMVASTATIN 20 MG PO TABS
20.0000 mg | ORAL_TABLET | Freq: Every morning | ORAL | 1 refills | Status: DC
Start: 2020-07-14 — End: 2020-10-28

## 2020-07-14 NOTE — Progress Notes (Signed)
Assessment & Plan:  1. Essential hypertension - Uncontrolled. Started back on Lisinopril today. Diet and exercise encouraged. Education provided on the DASH diet. - lisinopril (ZESTRIL) 20 MG tablet; Take 1 tablet (20 mg total) by mouth daily.  Dispense: 90 tablet; Refill: 1 - CMP14+EGFR - Lipid panel  2. Pure hypercholesterolemia - Labs to assess. - simvastatin (ZOCOR) 20 MG tablet; Take 1 tablet (20 mg total) by mouth every morning.  Dispense: 90 tablet; Refill: 1 - CMP14+EGFR - Lipid panel  3. Depression, major, single episode, mild ([place]) - Controlled without medication.  4. Anxiety - Controlled without medication.  5. DDD (degenerative disc disease), lumbar - diclofenac (VOLTAREN) 75 MG EC tablet; Take 1 tablet (75 mg total) by mouth 2 (two) times daily.  Dispense: 60 tablet; Refill: 2 - Ambulatory referral to Pain Clinic  6. Sciatica of right side - Ambulatory referral to Pain Clinic  7. History of kidney stones - Keep follow-up with urology.  8. Dizziness - Anemia Profile B - Thyroid Panel With TSH  9. Anemia, unspecified type - Anemia Profile B  10. DUB (dysfunctional uterine bleeding) - desogestrel-ethinyl estradiol (VIORELE) 0.15-0.02/0.01 MG (21/5) tablet; Take 1 tablet by mouth daily.  Dispense: 84 tablet; Refill: 3  11. Encounter for prescription of oral contraceptives - Pregnancy, urine  12. Tonsillitis - amoxicillin-clavulanate (AUGMENTIN) 875-125 MG tablet; Take 1 tablet by mouth 2 (two) times daily for 10 days.  Dispense: 20 tablet; Refill: 0   Return for follow-up of chronic medication conditions.  Hendricks Limes, MSN, APRN, FNP-C [facility]  Subjective:    Patient ID: Sandra Carroll, female    DOB: September 23, 1965, 55 y.o.   MRN: [id number]  Patient Care Team: Loman Brooklyn, FNP as PCP - General (Family Medicine) Gala Romney Cristopher Estimable, MD as Consulting Physician (Gastroenterology) Loman Brooklyn, FNP (Family Medicine)    Chief Complaint:  Chief Complaint  Patient presents with  . Hypertension    Check up of chronic medical conditions   . Referral    Pain clinic - chronic back pain   . Dizziness    On and off x 6 months     HPI: Sandra Carroll is a 55 y.o. female presenting on 07/14/2020 for Hypertension (Check up of chronic medical conditions/), Referral (Pain clinic - chronic back pain/), and Dizziness (On and off x 6 months/)  Depression: patient reports she quit taking Celexa due to the cost. She does not feel she needs anything to replace it.  Depression screen [facility] 2/9 07/14/2020 03/12/2020 01/09/2020  Decreased Interest 2 1 1   Down, Depressed, Hopeless 2 1 3   PHQ - 2 Score 4 2 4   Altered sleeping 0 0 0  Tired, decreased energy 0 1 1  Change in appetite 0 1 2  Feeling bad or failure about yourself  - 0 2  Trouble concentrating 0 0 1  Moving slowly or fidgety/restless 0 0 0  Suicidal thoughts 0 - 0  PHQ-9 Score 4 4 10   Difficult doing work/chores Not difficult at all - -  Some recent data might be hidden   GAD 7 : Generalized Anxiety Score 07/14/2020 03/12/2020 01/09/2020 07/05/2019  Nervous, Anxious, on Edge 0 1 1 3   Control/stop worrying 0 2 1 3   Worry too much - different things 0 2 1 2   Trouble relaxing 0 1 2 2   Restless 0 1 0 0  Easily annoyed or irritable 0 0 1 3  Afraid - awful might  happen 0 0 0 1  Total GAD 7 Score 0 7 6 14   Anxiety Difficulty Not difficult at all Somewhat difficult - Somewhat difficult    Hypertension: patient is not current taking her medication to treat. She reports have frequent dizzy spells for the past six months. She does not check her BP at home.   Chronic Back Pain: Patient is requesting a referral to [facility] on [address]. She was previously referred; it is unclear why she did not schedule an appointment at that time. She reports her pain impairs her ability to wipe when she uses the restroom and bathe herself.   Patient had kidney stones in  November 2021. She has a follow-up scheduled with urology in April 2022. She was septic at the time of her stones.   New complaints: Patient c/o sore throat x3 days. Denies any other respiratory symptoms.    Social history:  Relevant past medical, surgical, family and social history reviewed and updated as indicated. Interim medical history since our last visit reviewed.  Allergies and medications reviewed and updated.  DATA REVIEWED: CHART IN EPIC  ROS: Negative unless specifically indicated above in HPI.    Current Outpatient Medications:  .  desogestrel-ethinyl estradiol (VIORELE) 0.15-0.02/0.01 MG (21/5) tablet, Take 1 tablet by mouth daily., Disp: 28 tablet, Rfl: 2 .  lisinopril (ZESTRIL) 20 MG tablet, Take 1 tablet (20 mg total) by mouth daily., Disp: , Rfl: 0 .  simvastatin (ZOCOR) 20 MG tablet, Take 1 tablet (20 mg total) by mouth every morning., Disp: 90 tablet, Rfl: 1   No Known Allergies Past Medical History:  Diagnosis Date  . Anxiety   . Chronic back pain   . Complication of anesthesia 1991 and 1998   childbirth --reaction to anesthesia was itching.   . DDD (degenerative disc disease)   . Depression   . Headache    migraines  . Hemorrhoids   . History of kidney stones   . Hyperlipidemia   . Hypertension   . Kidney stone   . PONV (postoperative nausea and vomiting)     Past Surgical History:  Procedure Laterality Date  . ADENOIDECTOMY  1978  . [place]   pt had itching with 1991 c-section.   . CHOLECYSTECTOMY    . COLONOSCOPY N/A 03/01/2013   Dr. Gala Romney- normal rectum except for anal canal/internal hemorrhoidal tags. colonic mucosa appeared normal.  . CYSTOSCOPY W/ URETERAL STENT PLACEMENT Left 12/09/2019   Procedure: CYSTOSCOPY WITH RETROGRADE PYELOGRAM/URETERAL STENT PLACEMENT;  Surgeon: Irine Seal, MD;  Location: [facility];  Service: Urology;  Laterality: Left;  . CYSTOSCOPY W/ URETERAL STENT PLACEMENT Right 05/07/2020   Procedure:  CYSTOSCOPY WITH RETROGRADE PYELOGRAM/URETERAL STENT PLACEMENT;  Surgeon: Festus Aloe, MD;  Location: [facility];  Service: Urology;  Laterality: Right;  . CYSTOSCOPY WITH RETROGRADE PYELOGRAM, URETEROSCOPY AND STENT PLACEMENT Right 06/02/2020   Procedure: CYSTOSCOPY WITH RETROGRADE PYELOGRAM, URETEROSCOPY, BASKET EXTRACTION OF STONES AND STENT EXCHANGE;  Surgeon: Cleon Gustin, MD;  Location: [facility];  Service: Urology;  Laterality: Right;  . CYSTOSCOPY/URETEROSCOPY/HOLMIUM LASER/STENT PLACEMENT Left 12/17/2019   Procedure: LEFT URETEROSCOPY/STENT EXCHANGE/ EXTRACTION OF LEFT URETERAL CALCULI;  Surgeon: Irine Seal, MD;  Location: [facility];  Service: Urology;  Laterality: Left;  . KNEE ARTHROSCOPY Right 2003  . LITHOTRIPSY    . LUMBAR LAMINECTOMY/DECOMPRESSION MICRODISCECTOMY  03/02/2012   Procedure: LUMBAR LAMINECTOMY/DECOMPRESSION MICRODISCECTOMY 1 LEVEL;  Surgeon: Winfield Cunas, MD;  Location: [facility];  Service: Neurosurgery;  Laterality: Left;  LEFT Lumbar Five-Sacral One Laminectomy for Decompression     Social History   Socioeconomic History  . Marital status: Single    Spouse name: Not on file  . Number of children: Not on file  . Years of education: Not on file  . Highest education level: Not on file  Occupational History  . Not on file  Tobacco Use  . Smoking status: Former Smoker    Packs/day: 0.25    Years: 30.00    Pack years: 7.50    Types: Cigarettes    Quit date: 2010    Years since quitting: 12.0  . Smokeless tobacco: Never Used  Vaping Use  . Vaping Use: Never used  Substance and Sexual Activity  . Alcohol use: Yes    Comment: rare  . Drug use: Never  . Sexual activity: Not Currently    Birth control/protection: Pill  Other Topics Concern  . Not on file  Social History Narrative   ** Merged History Encounter **       Social Determinants of Health   Financial Resource Strain: Not on file  Food Insecurity: Not on file  Transportation Needs: Not  on file  Physical Activity: Not on file  Stress: Not on file  Social Connections: Not on file  Intimate Partner Violence: Not on file        Objective:    BP 139/90   Pulse (!) 117   Temp 98.1 F (36.7 C) (Temporal)   Ht 5' 2"  (1.575 m)   Wt 206 lb 3.2 oz (93.5 kg)   LMP 10/17/2019 (Approximate)   SpO2 97%   BMI 37.71 kg/m   Wt Readings from Last 3 Encounters:  07/14/20 206 lb 3.2 oz (93.5 kg)  07/08/20 200 lb (90.7 kg)  05/20/20 195 lb (88.5 kg)    Physical Exam Vitals reviewed.  Constitutional:      General: She is not in acute distress.    Appearance: Normal appearance. She is obese. She is not ill-appearing, toxic-appearing or diaphoretic.  HENT:     Head: Normocephalic and atraumatic.  Eyes:     General: No scleral icterus.       Right eye: No discharge.        Left eye: No discharge.     Conjunctiva/sclera: Conjunctivae normal.  Cardiovascular:     Rate and Rhythm: Normal rate and regular rhythm.     Heart sounds: Normal heart sounds. No murmur heard. No friction rub. No gallop.   Pulmonary:     Effort: Pulmonary effort is normal. No respiratory distress.     Breath sounds: Normal breath sounds. No stridor. No wheezing, rhonchi or rales.  Musculoskeletal:        General: Normal range of motion.     Cervical back: Normal range of motion.  Skin:    General: Skin is warm and dry.     Capillary Refill: Capillary refill takes less than 2 seconds.  Neurological:     General: No focal deficit present.     Mental Status: She is alert and oriented to person, place, and time. Mental status is at baseline.  Psychiatric:        Mood and Affect: Mood normal.        Behavior: Behavior normal.        Thought Content: Thought content normal.        Judgment: Judgment normal.     No results found for: TSH Lab Results  Component Value Date  WBC 10.2 05/09/2020   HGB 9.2 (L) 05/09/2020   HCT 28.5 (L) 05/09/2020   MCV 95.6 05/09/2020   PLT 234 05/09/2020    Lab Results  Component Value Date   NA 142 06/10/2020   K 3.5 06/10/2020   CO2 22 06/10/2020   GLUCOSE 117 (H) 06/10/2020   BUN 9 06/10/2020   CREATININE 1.06 (H) 06/10/2020   BILITOT 0.4 05/09/2020   ALKPHOS 60 05/09/2020   AST 12 (L) 05/09/2020   ALT 14 05/09/2020   PROT 6.0 (L) 05/09/2020   ALBUMIN 2.3 (L) 05/09/2020   CALCIUM 10.0 06/10/2020   ANIONGAP 6 05/09/2020   Lab Results  Component Value Date   CHOL 234 (H) 04/17/2020   Lab Results  Component Value Date   HDL 67 04/17/2020   Lab Results  Component Value Date   LDLCALC 150 (H) 04/17/2020   Lab Results  Component Value Date   TRIG 95 04/17/2020   Lab Results  Component Value Date   CHOLHDL 3.5 04/17/2020   No results found for: HGBA1C

## 2020-07-14 NOTE — Patient Instructions (Signed)
DASH Eating Plan DASH stands for Dietary Approaches to Stop Hypertension. The DASH eating plan is a healthy eating plan that has been shown to:  Reduce high blood pressure (hypertension).  Reduce your risk for type 2 diabetes, heart disease, and stroke.  Help with weight loss. What are tips for following this plan? Reading food labels  Check food labels for the amount of salt (sodium) per serving. Choose foods with less than 5 percent of the Daily Value of sodium. Generally, foods with less than 300 milligrams (mg) of sodium per serving fit into this eating plan.  To find whole grains, look for the word "whole" as the first word in the ingredient list. Shopping  Buy products labeled as "low-sodium" or "no salt added."  Buy fresh foods. Avoid canned foods and pre-made or frozen meals. Cooking  Avoid adding salt when cooking. Use salt-free seasonings or herbs instead of table salt or sea salt. Check with your health care provider or pharmacist before using salt substitutes.  Do not fry foods. Cook foods using healthy methods such as baking, boiling, grilling, roasting, and broiling instead.  Cook with heart-healthy oils, such as olive, canola, avocado, soybean, or sunflower oil. Meal planning  Eat a balanced diet that includes: ? 4 or more servings of fruits and 4 or more servings of vegetables each day. Try to fill one-half of your plate with fruits and vegetables. ? 6-8 servings of whole grains each day. ? Less than 6 oz (170 g) of lean meat, poultry, or fish each day. A 3-oz (85-g) serving of meat is about the same size as a deck of cards. One egg equals 1 oz (28 g). ? 2-3 servings of low-fat dairy each day. One serving is 1 cup (237 mL). ? 1 serving of nuts, seeds, or beans 5 times each week. ? 2-3 servings of heart-healthy fats. Healthy fats called omega-3 fatty acids are found in foods such as walnuts, flaxseeds, fortified milks, and eggs. These fats are also found in  cold-water fish, such as sardines, salmon, and mackerel.  Limit how much you eat of: ? Canned or prepackaged foods. ? Food that is high in trans fat, such as some fried foods. ? Food that is high in saturated fat, such as fatty meat. ? Desserts and other sweets, sugary drinks, and other foods with added sugar. ? Full-fat dairy products.  Do not salt foods before eating.  Do not eat more than 4 egg yolks a week.  Try to eat at least 2 vegetarian meals a week.  Eat more home-cooked food and less restaurant, buffet, and fast food.   Lifestyle  When eating at a restaurant, ask that your food be prepared with less salt or no salt, if possible.  If you drink alcohol: ? Limit how much you use to:  0-1 drink a day for women who are not pregnant.  0-2 drinks a day for men. ? Be aware of how much alcohol is in your drink. In the [place] one drink equals one 12 oz bottle of beer (355 mL), one 5 oz glass of wine (148 mL), or one 1 oz glass of hard liquor (44 mL). General information  Avoid eating more than 2,300 mg of salt a day. If you have hypertension, you may need to reduce your sodium intake to 1,500 mg a day.  Work with your health care provider to maintain a healthy body weight or to lose weight. Ask what an ideal weight is for you.    Get at least 30 minutes of exercise that causes your heart to beat faster (aerobic exercise) most days of the week. Activities may include walking, swimming, or biking.  Work with your health care provider or dietitian to adjust your eating plan to your individual calorie needs. What foods should I eat? Fruits All fresh, dried, or frozen fruit. Canned fruit in natural juice (without added sugar). Vegetables Fresh or frozen vegetables (raw, steamed, roasted, or grilled). Low-sodium or reduced-sodium tomato and vegetable juice. Low-sodium or reduced-sodium tomato sauce and tomato paste. Low-sodium or reduced-sodium canned vegetables. Grains Whole-grain  or whole-wheat bread. Whole-grain or whole-wheat pasta. Brown rice. Oatmeal. Quinoa. Bulgur. Whole-grain and low-sodium cereals. Pita bread. Low-fat, low-sodium crackers. Whole-wheat flour tortillas. Meats and other proteins Skinless chicken or turkey. Ground chicken or turkey. Pork with fat trimmed off. Fish and seafood. Egg whites. Dried beans, peas, or lentils. Unsalted nuts, nut butters, and seeds. Unsalted canned beans. Lean cuts of beef with fat trimmed off. Low-sodium, lean precooked or cured meat, such as sausages or meat loaves. Dairy Low-fat (1%) or fat-free (skim) milk. Reduced-fat, low-fat, or fat-free cheeses. Nonfat, low-sodium ricotta or cottage cheese. Low-fat or nonfat yogurt. Low-fat, low-sodium cheese. Fats and oils Soft margarine without trans fats. Vegetable oil. Reduced-fat, low-fat, or light mayonnaise and salad dressings (reduced-sodium). Canola, safflower, olive, avocado, soybean, and sunflower oils. Avocado. Seasonings and condiments Herbs. Spices. Seasoning mixes without salt. Other foods Unsalted popcorn and pretzels. Fat-free sweets. The items listed above may not be a complete list of foods and beverages you can eat. Contact a dietitian for more information. What foods should I avoid? Fruits Canned fruit in a light or heavy syrup. Fried fruit. Fruit in cream or butter sauce. Vegetables Creamed or fried vegetables. Vegetables in a cheese sauce. Regular canned vegetables (not low-sodium or reduced-sodium). Regular canned tomato sauce and paste (not low-sodium or reduced-sodium). Regular tomato and vegetable juice (not low-sodium or reduced-sodium). Pickles. Olives. Grains Baked goods made with fat, such as croissants, muffins, or some breads. Dry pasta or rice meal packs. Meats and other proteins Fatty cuts of meat. Ribs. Fried meat. Bacon. Bologna, salami, and other precooked or cured meats, such as sausages or meat loaves. Fat from the back of a pig (fatback).  Bratwurst. Salted nuts and seeds. Canned beans with added salt. Canned or smoked fish. Whole eggs or egg yolks. Chicken or turkey with skin. Dairy Whole or 2% milk, cream, and half-and-half. Whole or full-fat cream cheese. Whole-fat or sweetened yogurt. Full-fat cheese. Nondairy creamers. Whipped toppings. Processed cheese and cheese spreads. Fats and oils Butter. Stick margarine. Lard. Shortening. Ghee. Bacon fat. Tropical oils, such as coconut, palm kernel, or palm oil. Seasonings and condiments Onion salt, garlic salt, seasoned salt, table salt, and sea salt. Worcestershire sauce. Tartar sauce. Barbecue sauce. Teriyaki sauce. Soy sauce, including reduced-sodium. Steak sauce. Canned and packaged gravies. Fish sauce. Oyster sauce. Cocktail sauce. Store-bought horseradish. Ketchup. Mustard. Meat flavorings and tenderizers. Bouillon cubes. Hot sauces. Pre-made or packaged marinades. Pre-made or packaged taco seasonings. Relishes. Regular salad dressings. Other foods Salted popcorn and pretzels. The items listed above may not be a complete list of foods and beverages you should avoid. Contact a dietitian for more information. Where to find more information  National Heart, Lung, and Blood Institute: www.nhlbi.nih.gov  American Heart Association: www.heart.org  Academy of Nutrition and Dietetics: www.eatright.org  National Kidney Foundation: www.kidney.org Summary  The DASH eating plan is a healthy eating plan that has been shown to reduce high   blood pressure (hypertension). It may also reduce your risk for type 2 diabetes, heart disease, and stroke.  When on the DASH eating plan, aim to eat more fresh fruits and vegetables, whole grains, lean proteins, low-fat dairy, and heart-healthy fats.  With the DASH eating plan, you should limit salt (sodium) intake to 2,300 mg a day. If you have hypertension, you may need to reduce your sodium intake to 1,500 mg a day.  Work with your health care  provider or dietitian to adjust your eating plan to your individual calorie needs. This information is not intended to replace advice given to you by your health care provider. Make sure you discuss any questions you have with your health care provider. Document Revised: 05/10/2019 Document Reviewed: 05/10/2019 Elsevier Patient Education  2021 Elsevier Inc.   

## 2020-07-15 ENCOUNTER — Telehealth: Payer: Self-pay

## 2020-07-15 LAB — CMP14+EGFR
ALT: 18 IU/L (ref 0–32)
AST: 20 IU/L (ref 0–40)
Albumin/Globulin Ratio: 1.3 (ref 1.2–2.2)
Albumin: 4.3 g/dL (ref 3.8–4.9)
Alkaline Phosphatase: 124 IU/L — ABNORMAL HIGH (ref 44–121)
BUN/Creatinine Ratio: 14 (ref 9–23)
BUN: 18 mg/dL (ref 6–24)
Bilirubin Total: 0.3 mg/dL (ref 0.0–1.2)
CO2: 23 mmol/L (ref 20–29)
Calcium: 9.9 mg/dL (ref 8.7–10.2)
Chloride: 104 mmol/L (ref 96–106)
Creatinine, Ser: 1.25 mg/dL — ABNORMAL HIGH (ref 0.57–1.00)
GFR calc Af Amer: 57 mL/min/{1.73_m2} — ABNORMAL LOW (ref >59)
GFR calc non Af Amer: 50 mL/min/{1.73_m2} — ABNORMAL LOW (ref >59)
Globulin, Total: 3.3 g/dL (ref 1.5–4.5)
Glucose: 102 mg/dL — ABNORMAL HIGH (ref 65–99)
Potassium: 3.9 mmol/L (ref 3.5–5.2)
Sodium: 140 mmol/L (ref 134–144)
Total Protein: 7.6 g/dL (ref 6.0–8.5)

## 2020-07-15 LAB — ANEMIA PROFILE B
Basophils Absolute: 0 10*3/uL (ref 0.0–0.2)
Basos: 0 %
EOS (ABSOLUTE): 0 10*3/uL (ref 0.0–0.4)
Eos: 0 %
Ferritin: 116 ng/mL (ref 15–150)
Folate: 4.1 ng/mL (ref >3.0)
Hematocrit: 36.8 % (ref 34.0–46.6)
Hemoglobin: 12.7 g/dL (ref 11.1–15.9)
Immature Grans (Abs): 0 10*3/uL (ref 0.0–0.1)
Immature Granulocytes: 0 %
Iron Saturation: 5 % — CL (ref 15–55)
Iron: 16 ug/dL — ABNORMAL LOW (ref 27–159)
Lymphocytes Absolute: 1.2 10*3/uL (ref 0.7–3.1)
Lymphs: 10 %
MCH: 31.3 pg (ref 26.6–33.0)
MCHC: 34.5 g/dL (ref 31.5–35.7)
MCV: 91 fL (ref 79–97)
Monocytes Absolute: 0.7 10*3/uL (ref 0.1–0.9)
Monocytes: 6 %
Neutrophils Absolute: 9.6 10*3/uL — ABNORMAL HIGH (ref 1.4–7.0)
Neutrophils: 84 %
Platelets: 276 10*3/uL (ref 150–450)
RBC: 4.06 x10E6/uL (ref 3.77–5.28)
RDW: 13.6 % (ref 11.7–15.4)
Retic Ct Pct: 1.1 % (ref 0.6–2.6)
Total Iron Binding Capacity: 343 ug/dL (ref 250–450)
UIBC: 327 ug/dL (ref 131–425)
Vitamin B-12: 186 pg/mL — ABNORMAL LOW (ref 232–1245)
WBC: 11.6 10*3/uL — ABNORMAL HIGH (ref 3.4–10.8)

## 2020-07-15 LAB — LIPID PANEL
Chol/HDL Ratio: 3.5 ratio (ref 0.0–4.4)
Cholesterol, Total: 229 mg/dL — ABNORMAL HIGH (ref 100–199)
HDL: 65 mg/dL (ref >39)
LDL Chol Calc (NIH): 148 mg/dL — ABNORMAL HIGH (ref 0–99)
Triglycerides: 93 mg/dL (ref 0–149)
VLDL Cholesterol Cal: 16 mg/dL (ref 5–40)

## 2020-07-15 LAB — THYROID PANEL WITH TSH
Free Thyroxine Index: 1.4 (ref 1.2–4.9)
T3 Uptake Ratio: 23 % — ABNORMAL LOW (ref 24–39)
T4, Total: 6.3 ug/dL (ref 4.5–12.0)
TSH: 0.403 u[IU]/mL — ABNORMAL LOW (ref 0.450–4.500)

## 2020-07-16 NOTE — Telephone Encounter (Signed)
I am fine with a note being provided for the day she saw me in the office.

## 2020-07-16 NOTE — Telephone Encounter (Signed)
Letter wrote and placed up front- patient aware 

## 2020-07-20 ENCOUNTER — Encounter: Payer: Self-pay | Admitting: Family Medicine

## 2020-07-20 DIAGNOSIS — D509 Iron deficiency anemia, unspecified: Secondary | ICD-10-CM

## 2020-07-20 DIAGNOSIS — E538 Deficiency of other specified B group vitamins: Secondary | ICD-10-CM

## 2020-07-20 HISTORY — DX: Deficiency of other specified B group vitamins: E53.8

## 2020-07-20 HISTORY — DX: Iron deficiency anemia, unspecified: D50.9

## 2020-09-30 ENCOUNTER — Ambulatory Visit (HOSPITAL_COMMUNITY)
Admission: RE | Admit: 2020-09-30 | Discharge: 2020-09-30 | Disposition: A | Discharge status: Home / Self Care | Payer: PRIVATE HEALTH INSURANCE | Source: Ambulatory Visit | Attending: Urology | Admitting: Urology

## 2020-09-30 ENCOUNTER — Other Ambulatory Visit: Payer: Self-pay

## 2020-09-30 DIAGNOSIS — N201 Calculus of ureter: Secondary | ICD-10-CM | POA: Diagnosis present

## 2020-10-07 ENCOUNTER — Ambulatory Visit (INDEPENDENT_AMBULATORY_CARE_PROVIDER_SITE_OTHER): Payer: PRIVATE HEALTH INSURANCE | Admitting: Urology

## 2020-10-07 ENCOUNTER — Other Ambulatory Visit: Payer: Self-pay

## 2020-10-07 ENCOUNTER — Encounter: Payer: Self-pay | Admitting: Urology

## 2020-10-07 VITALS — BP 123/73 | HR 94 | Temp 97.3°F | Ht 62.0 in | Wt 215.0 lb

## 2020-10-07 DIAGNOSIS — N200 Calculus of kidney: Secondary | ICD-10-CM

## 2020-10-07 MED ORDER — OXYCODONE-ACETAMINOPHEN 7.5-325 MG PO TABS: 1.0000 | ORAL_TABLET | ORAL | 0 refills | Status: DC | PRN

## 2020-10-07 NOTE — Progress Notes (Signed)
Urological Symptom Review  Patient is experiencing the following symptoms: Get up at night to urinate  Kidney stones   Review of Systems  Gastrointestinal (upper)  : Negative for upper GI symptoms  Gastrointestinal (lower) : Negative for lower GI symptoms  Constitutional : Night Sweats  Skin: Negative for skin symptoms  Eyes: Negative for eye symptoms  Ear/Nose/Throat : Negative for Ear/Nose/Throat symptoms  Hematologic/Lymphatic: Negative for Hematologic/Lymphatic symptoms  Cardiovascular : Negative for cardiovascular symptoms  Respiratory : Negative for respiratory symptoms  Endocrine: Negative for endocrine symptoms  Musculoskeletal: Negative for musculoskeletal symptoms  Neurological: Headaches  Psychologic: Negative for psychiatric symptoms

## 2020-10-07 NOTE — Patient Instructions (Signed)

## 2020-10-07 NOTE — Progress Notes (Signed)
10/07/2020 2:59 PM   Sandra Carroll [birth year] [id number]  Referring provider: Remus Loffler, PA-C [id number],  [place] [address]  nephrolithiasis  HPI: Sandra Carroll is a 54yo here for followup for nephrolithiasis. She drinks 2 peps/cokes daily. She drinks 2 bottles of water daily. 24 hour urine showed low volume 1.4L, Ca 315, Sodium 224. Normal citrate and magnesium.    PMH: Past Medical History:  Diagnosis Date  . Acute pyelonephritis 05/07/2020  . Anxiety   . Chronic back pain   . Complication of anesthesia 1991 and 1998   childbirth --reaction to anesthesia was itching.   . DDD (degenerative disc disease)   . Depression   . Headache    migraines  . Hemorrhoids   . History of kidney stones   . Hyperlipidemia   . Hypertension   . Iron deficiency anemia 07/20/2020  . Kidney stone   . PONV (postoperative nausea and vomiting)   . Vitamin B12 deficiency 07/20/2020    Surgical History: Past Surgical History:  Procedure Laterality Date  . ADENOIDECTOMY  1978  . CESAREAN SECTION  1991 1998   pt had itching with 1991 c-section.   . CHOLECYSTECTOMY    . COLONOSCOPY N/A 03/01/2013   Dr. Jena Gauss- normal rectum except for anal canal/internal hemorrhoidal tags. colonic mucosa appeared normal.  . CYSTOSCOPY W/ URETERAL STENT PLACEMENT Left 12/09/2019   Procedure: CYSTOSCOPY WITH RETROGRADE PYELOGRAM/URETERAL STENT PLACEMENT;  Surgeon: Bjorn Pippin, MD;  Location: [facility];  Service: Urology;  Laterality: Left;  . CYSTOSCOPY W/ URETERAL STENT PLACEMENT Right 05/07/2020   Procedure: CYSTOSCOPY WITH RETROGRADE PYELOGRAM/URETERAL STENT PLACEMENT;  Surgeon: Jerilee Field, MD;  Location: [facility];  Service: Urology;  Laterality: Right;  . CYSTOSCOPY WITH RETROGRADE PYELOGRAM, URETEROSCOPY AND STENT PLACEMENT Right 06/02/2020   Procedure: CYSTOSCOPY WITH RETROGRADE PYELOGRAM, URETEROSCOPY, BASKET EXTRACTION OF STONES AND STENT EXCHANGE;  Surgeon: Malen Gauze, MD;  Location:  [facility];  Service: Urology;  Laterality: Right;  . CYSTOSCOPY/URETEROSCOPY/HOLMIUM LASER/STENT PLACEMENT Left 12/17/2019   Procedure: LEFT URETEROSCOPY/STENT EXCHANGE/ EXTRACTION OF LEFT URETERAL CALCULI;  Surgeon: Bjorn Pippin, MD;  Location: [facility];  Service: Urology;  Laterality: Left;  . KNEE ARTHROSCOPY Right 2003  . LITHOTRIPSY    . LUMBAR LAMINECTOMY/DECOMPRESSION MICRODISCECTOMY  03/02/2012   Procedure: LUMBAR LAMINECTOMY/DECOMPRESSION MICRODISCECTOMY 1 LEVEL;  Surgeon: Carmela Hurt, MD;  Location: [facility];  Service: Neurosurgery;  Laterality: Left;  LEFT Lumbar Five-Sacral One Laminectomy for Decompression     Home Medications:  Allergies as of 10/07/2020   No Known Allergies     Medication List       Accurate as of October 07, 2020  2:59 PM. If you have any questions, ask your nurse or doctor.        desogestrel-ethinyl estradiol 0.15-0.02/0.01 MG (21/5) tablet Commonly known as: Viorele Take 1 tablet by mouth daily.   diclofenac 75 MG EC tablet Commonly known as: VOLTAREN Take 1 tablet (75 mg total) by mouth 2 (two) times daily.   lisinopril 20 MG tablet Commonly known as: ZESTRIL Take 1 tablet (20 mg total) by mouth daily.   simvastatin 20 MG tablet Commonly known as: ZOCOR Take 1 tablet (20 mg total) by mouth every morning.       Allergies: No Known Allergies  Family History: Family History  Problem Relation Age of Onset  . Hypertension Mother   . Cancer Father   . Hypertension Father   . Colon cancer Neg Hx  Social History:  reports that she quit smoking about 12 years ago. Her smoking use included cigarettes. She has a 7.50 pack-year smoking history. She has never used smokeless tobacco. She reports current alcohol use. She reports that she does not use drugs.  ROS: All other review of systems were reviewed and are negative except what is noted above in HPI  Physical Exam: BP 123/73   Pulse 94   Temp (!) 97.3 F (36.3 C)   Ht 5\' 2"  (1.575  m)   Wt 215 lb (97.5 kg)   LMP 10/17/2019 (Approximate)   BMI 39.32 kg/m   Constitutional:  Alert and oriented, No acute distress. HEENT: [place] AT, moist mucus membranes.  Trachea midline, no masses. Cardiovascular: No clubbing, cyanosis, or edema. Respiratory: Normal respiratory effort, no increased work of breathing. GI: Abdomen is soft, nontender, nondistended, no abdominal masses GU: No CVA tenderness.  Lymph: No cervical or inguinal lymphadenopathy. Skin: No rashes, bruises or suspicious lesions. Neurologic: Grossly intact, no focal deficits, moving all 4 extremities. Psychiatric: Normal mood and affect.  Laboratory Data: Lab Results  Component Value Date   WBC 11.6 (H) 07/14/2020   HGB 12.7 07/14/2020   HCT 36.8 07/14/2020   MCV 91 07/14/2020   PLT 276 07/14/2020    Lab Results  Component Value Date   CREATININE 1.25 (H) 07/14/2020    No results found for: PSA  No results found for: TESTOSTERONE  No results found for: HGBA1C  Urinalysis    Component Value Date/Time   COLORURINE YELLOW 05/07/2020 1233   APPEARANCEUR Clear 07/08/2020 1408   LABSPEC 1.013 05/07/2020 1233   PHURINE 6.0 05/07/2020 1233   GLUCOSEU Negative 07/08/2020 1408   HGBUR SMALL (A) 05/07/2020 1233   BILIRUBINUR Negative 07/08/2020 1408   KETONESUR NEGATIVE 05/07/2020 1233   PROTEINUR Negative 07/08/2020 1408   PROTEINUR 100 (A) 05/07/2020 1233   UROBILINOGEN 0.2 07/25/2014 0215   NITRITE Negative 07/08/2020 1408   NITRITE NEGATIVE 05/07/2020 1233   LEUKOCYTESUR Negative 07/08/2020 1408   LEUKOCYTESUR LARGE (A) 05/07/2020 1233    Lab Results  Component Value Date   LABMICR Comment 07/08/2020   WBCUA 6-10 (A) 06/10/2020   LABEPIT 0-10 06/10/2020   MUCUS Present 06/10/2020   BACTERIA Moderate (A) 06/10/2020    Pertinent Imaging: Renal 06/12/2020 10/02/2020: Images reviewed and discussed with the patient  No results found for this or any previous visit.  No results found for this or any  previous visit.  No results found for this or any previous visit.  No results found for this or any previous visit.  Results for orders placed during the hospital encounter of 09/30/20  Ultrasound renal complete  Narrative CLINICAL DATA:  Follow-up nephrolithiasis.  EXAM: RENAL / URINARY TRACT ULTRASOUND COMPLETE  COMPARISON:  Renal ultrasound 07/01/2020 and CT 05/07/2020  FINDINGS: Right Kidney:  Renal measurements: 11.8 x 6.4 x 5.4 cm = volume: 214 mL. Echogenicity within normal limits. No mass or hydronephrosis visualized. 5 mm stone over the mid pole and 6 mm stone over the lower pole.  Left Kidney:  Renal measurements: 10.0 x 5.1 x 4.8 cm = volume: 128 mL. Echogenicity within normal limits. No mass or hydronephrosis visualized. 5 mm stone over the lower pole.  Bladder:  Appears normal for degree of bladder distention. Left ureteral jet visualized. Right ureteral jet not visualized.  Other:  None.  IMPRESSION: Normal size kidneys with mild bilateral nephrolithiasis as described. No hydronephrosis.   Electronically Signed By: 05/09/2020  Micheline Maze M.D. On: 10/02/2020 09:06  No results found for this or any previous visit.  No results found for this or any previous visit.  Results for orders placed during the hospital encounter of 05/07/20  CT Renal Stone Study  Narrative CLINICAL DATA:  Right-sided lower back pain  EXAM: CT ABDOMEN AND PELVIS WITHOUT CONTRAST  TECHNIQUE: Multidetector CT imaging of the abdomen and pelvis was performed following the standard protocol without IV contrast.  COMPARISON:  None.  FINDINGS: Lower chest: The visualized heart size within normal limits. No pericardial fluid/thickening.  No hiatal hernia.  The visualized portions of the lungs are clear.  Hepatobiliary: Although limited due to the lack of intravenous contrast, normal in appearance without gross focal abnormality. The patient is status post  cholecystectomy. No biliary ductal dilation.  Pancreas:  Unremarkable.  No surrounding inflammatory changes.  Spleen: Normal in size. Although limited due to the lack of intravenous contrast, normal in appearance.  Adrenals/Urinary Tract: Both adrenal glands appear normal. Moderate right pelvicaliectasis and ureterectasis is seen down the level of the distal ureter where there is several ureteral calculi 1 measuring 3 mm and 1 measuring 4 mm. There is right-sided perinephric and proximal periureteral stranding changes. There is a tiny punctate calcification in the posterior right bladder. There is also a a 3 mm calculus seen in the mid pole the right kidney. No left-sided renal or collecting system calculi are noted.  Stomach/Bowel: The stomach, small bowel, and colon are normal in appearance. No inflammatory changes or obstructive findings. Scattered colonic diverticula are noted.  Vascular/Lymphatic: There are no enlarged abdominal or pelvic lymph nodes. Scattered aortic atherosclerotic calcifications are seen without aneurysmal dilatation.  Reproductive: The uterus and adnexa are unremarkable.  Other: Bilateral fat containing inguinal hernias are noted.  Musculoskeletal: No acute or significant osseous findings.  IMPRESSION: Moderate right hydronephrosis to the distal ureter where there is several calcified ureteral calculi measuring 3 mm and 4 mm. There is also punctate calcification in the posterior right bladder.  Right-sided perinephric and periureteral stranding which could be from the recently passed stone versus pyelonephritis.  Nonobstructing right renal calculus.   Electronically Signed By: Jonna Clark M.D. On: 05/07/2020 16:09   Assessment & Plan:   1. Nephrolithiasis -Increase water intake and decrease sodium intake -RTC 3 months with renal US and KUB  No follow-ups on file.  [name], MD  [facility] [place]

## 2020-10-13 ENCOUNTER — Ambulatory Visit: Payer: PRIVATE HEALTH INSURANCE | Admitting: Family Medicine

## 2020-10-14 ENCOUNTER — Encounter: Payer: Self-pay | Admitting: Family Medicine

## 2020-10-28 ENCOUNTER — Ambulatory Visit (INDEPENDENT_AMBULATORY_CARE_PROVIDER_SITE_OTHER): Payer: PRIVATE HEALTH INSURANCE | Admitting: Family Medicine

## 2020-10-28 ENCOUNTER — Encounter: Payer: Self-pay | Admitting: Family Medicine

## 2020-10-28 ENCOUNTER — Ambulatory Visit (INDEPENDENT_AMBULATORY_CARE_PROVIDER_SITE_OTHER): Payer: PRIVATE HEALTH INSURANCE

## 2020-10-28 ENCOUNTER — Other Ambulatory Visit: Payer: Self-pay

## 2020-10-28 VITALS — BP 140/92 | HR 83 | Temp 97.8°F | Ht 62.0 in | Wt 211.2 lb

## 2020-10-28 DIAGNOSIS — M79672 Pain in left foot: Secondary | ICD-10-CM

## 2020-10-28 DIAGNOSIS — R232 Flushing: Secondary | ICD-10-CM

## 2020-10-28 DIAGNOSIS — I10 Essential (primary) hypertension: Secondary | ICD-10-CM

## 2020-10-28 DIAGNOSIS — F419 Anxiety disorder, unspecified: Secondary | ICD-10-CM

## 2020-10-28 DIAGNOSIS — N938 Other specified abnormal uterine and vaginal bleeding: Secondary | ICD-10-CM

## 2020-10-28 DIAGNOSIS — F332 Major depressive disorder, recurrent severe without psychotic features: Secondary | ICD-10-CM

## 2020-10-28 DIAGNOSIS — Z87442 Personal history of urinary calculi: Secondary | ICD-10-CM

## 2020-10-28 DIAGNOSIS — N1831 Chronic kidney disease, stage 3a: Secondary | ICD-10-CM

## 2020-10-28 DIAGNOSIS — Z596 Low income: Secondary | ICD-10-CM

## 2020-10-28 DIAGNOSIS — E7800 Pure hypercholesterolemia, unspecified: Secondary | ICD-10-CM

## 2020-10-28 DIAGNOSIS — R7989 Other specified abnormal findings of blood chemistry: Secondary | ICD-10-CM

## 2020-10-28 DIAGNOSIS — D509 Iron deficiency anemia, unspecified: Secondary | ICD-10-CM

## 2020-10-28 DIAGNOSIS — E538 Deficiency of other specified B group vitamins: Secondary | ICD-10-CM

## 2020-10-28 MED ORDER — CITALOPRAM HYDROBROMIDE 20 MG PO TABS: 20.0000 mg | ORAL_TABLET | Freq: Every day | ORAL | 5 refills | Status: DC

## 2020-10-28 MED ORDER — LISINOPRIL 20 MG PO TABS: 20.0000 mg | ORAL_TABLET | Freq: Every day | ORAL | 5 refills | Status: DC

## 2020-10-28 MED ORDER — SIMVASTATIN 20 MG PO TABS
20.0000 mg | ORAL_TABLET | Freq: Every day | ORAL | 5 refills | Status: DC
Start: 2020-10-28 — End: 2021-06-24

## 2020-10-28 NOTE — Patient Instructions (Addendum)
Estroven, Vitamin E, or Black Cohosh for menopausal symptoms.   Ferrous sulfate 325 mg twice daily for low iron.  Vitamin B-12 1,000 mcg once daily for vitamin B12 deficiency.

## 2020-10-29 LAB — CMP14+EGFR
ALT: 11 IU/L (ref 0–32)
AST: 14 IU/L (ref 0–40)
Albumin/Globulin Ratio: 1 — ABNORMAL LOW (ref 1.2–2.2)
Albumin: 3.7 g/dL — ABNORMAL LOW (ref 3.8–4.9)
Alkaline Phosphatase: 85 IU/L (ref 44–121)
BUN/Creatinine Ratio: 15 (ref 9–23)
BUN: 16 mg/dL (ref 6–24)
Bilirubin Total: <0.2 mg/dL (ref 0.0–1.2)
CO2: 21 mmol/L (ref 20–29)
Calcium: 10.1 mg/dL (ref 8.7–10.2)
Chloride: 103 mmol/L (ref 96–106)
Creatinine, Ser: 1.06 mg/dL — ABNORMAL HIGH (ref 0.57–1.00)
Globulin, Total: 3.6 g/dL (ref 1.5–4.5)
Glucose: 89 mg/dL (ref 65–99)
Potassium: 4.3 mmol/L (ref 3.5–5.2)
Sodium: 139 mmol/L (ref 134–144)
Total Protein: 7.3 g/dL (ref 6.0–8.5)
eGFR: 63 mL/min/{1.73_m2} (ref >59)

## 2020-10-29 LAB — TSH: TSH: 0.553 u[IU]/mL (ref 0.450–4.500)

## 2020-10-29 LAB — T4, FREE: Free T4: 1.08 ng/dL (ref 0.82–1.77)

## 2020-11-01 ENCOUNTER — Encounter: Payer: Self-pay | Admitting: Family Medicine

## 2020-11-01 DIAGNOSIS — Z596 Low income: Secondary | ICD-10-CM | POA: Insufficient documentation

## 2020-11-01 NOTE — Progress Notes (Signed)
Assessment & Plan:  1. Essential hypertension Uncontrolled without patient taking her medication. Encouraged to resume.  - CMP14+EGFR - lisinopril (ZESTRIL) 20 MG tablet; Take 1 tablet (20 mg total) by mouth daily.  Dispense: 30 tablet; Refill: 5  2. Major depressive disorder, recurrent severe without psychotic features ([place]) Uncontrolled without patient taking her medication. Encouraged to resume.  - CMP14+EGFR - citalopram (CELEXA) 20 MG tablet; Take 1 tablet (20 mg total) by mouth daily.  Dispense: 30 tablet; Refill: 5  3. Anxiety Uncontrolled without patient taking her medication. Encouraged to resume.  - CMP14+EGFR - citalopram (CELEXA) 20 MG tablet; Take 1 tablet (20 mg total) by mouth daily.  Dispense: 30 tablet; Refill: 5  4. Pure hypercholesterolemia Uncontrolled without patient taking her medication. Encouraged to resume.  - simvastatin (ZOCOR) 20 MG tablet; Take 1 tablet (20 mg total) by mouth daily at 6 PM.  Dispense: 30 tablet; Refill: 5  5. History of kidney stones Managed by urology.  6. DUB (dysfunctional uterine bleeding) Birth control discontinued as she is no longer having periods.   7. Iron deficiency anemia, unspecified iron deficiency anemia type Encouraged to take OTC supplement as previously directed. Ferrous sulfate 325 mg twice daily for low iron.  8. Vitamin B12 deficiency Encouraged to take OTC supplement as previously directed. Vitamin B-12 1,000 mcg once daily for vitamin B12 deficiency.   9. Abnormal TSH - TSH - T4, free  10. Stage 3a chronic kidney disease (HCC) - CMP14+EGFR  11. Left foot pain - DG Foot Complete Left  12. Hot flashes Encouraged to try Estroven, Vitamin D, and/or Black Cohosh. - TSH - T4, free  13. Patient cannot afford medications Encouraged to use GoodRx discount/retail price at [facility]. Two of her medications are on the $4 list and one is $9.    Return in about 6 weeks (around 12/09/2020) for follow-up of  chronic medication conditions.  Hendricks Limes, MSN, APRN, FNP-C [facility]  Subjective:    Patient ID: Sandra Carroll, female    DOB: [birth year], 55 y.o.   MRN: [id number]  Patient Care Team: Loman Brooklyn, FNP as PCP - General (Family Medicine) Gala Romney Cristopher Estimable, MD as Consulting Physician (Gastroenterology) Loman Brooklyn, FNP (Family Medicine)   Chief Complaint:  Chief Complaint  Patient presents with  . Hypertension  . Depression    3 month follow up of chronic medical conditions   . Foot Pain    Top left foot- ongoing     HPI: Sandra Carroll is a 55 y.o. female presenting on 10/28/2020 for Hypertension, Depression (3 month follow up of chronic medical conditions ), and Foot Pain (Top left foot- ongoing )  Depression: previously patient reported she quit taking Celexa due to the cost. She did not feel she needed anything to replace it. Today, she is tearful stating that she has given up on herself because of the kidney stones. She quit taking all of her medications except her birth control and pain medication.   Depression screen [facility] 2/9 10/28/2020 07/14/2020 03/12/2020  Decreased Interest _0 Down, Depressed, Hopeless _1 PHQ - 2 Score _2 Altered sleeping 3 0 0  Tired, decreased energy 3 0 1  Change in appetite 0 0 1  Feeling bad or failure about yourself  3 - 0  Trouble concentrating 3 0 0  Moving slowly or fidgety/restless 3 0 0  Suicidal thoughts 3 0 -  PHQ-9  Score _0 Difficult doing work/chores Not difficult at all Not difficult at all -  Some recent data might be hidden   GAD 7 : Generalized Anxiety Score 10/28/2020 07/14/2020 03/12/2020 01/09/2020  Nervous, Anxious, on Edge 1 0 1 1  Control/stop worrying 1 0 2 1  Worry too much - different things 1 0 2 1  Trouble relaxing 3 0 1 2  Restless 2 0 1 0  Easily annoyed or irritable 1 0 0 1  Afraid - awful might happen 0 0 0 0  Total GAD 7 Score 9 0 7 6  Anxiety Difficulty  Not difficult at all Not difficult at all Somewhat difficult -    Hypertension: patient is not current taking her medication to treat. She does not check her BP at home.   Hyperlipidemia: not taking Simvastatin. Previously uncontrolled.   Patient was previously diagnosed with iron and vitamin B12 deficiency and is not taking recommended supplements for either.   Patient reports having a hard time affording her medications.  New complaints: Patient c/o hot flashes x2-3 months and bilateral (L>R) midfoot pain x4-5 months.    Social history:  Relevant past medical, surgical, family and social history reviewed and updated as indicated. Interim medical history since our last visit reviewed.  Allergies and medications reviewed and updated.  DATA REVIEWED: CHART IN EPIC  ROS: Negative unless specifically indicated above in HPI.    Current Outpatient Medications:  .  citalopram (CELEXA) 20 MG tablet, Take 1 tablet (20 mg total) by mouth daily., Disp: 30 tablet, Rfl: 5 .  oxyCODONE-acetaminophen (PERCOCET) 7.5-325 MG tablet, Take 1 tablet by mouth every 4 (four) hours as needed for severe pain., Disp: 30 tablet, Rfl: 0 .  lisinopril (ZESTRIL) 20 MG tablet, Take 1 tablet (20 mg total) by mouth daily., Disp: 30 tablet, Rfl: 5 .  simvastatin (ZOCOR) 20 MG tablet, Take 1 tablet (20 mg total) by mouth daily at 6 PM., Disp: 30 tablet, Rfl: 5   No Known Allergies Past Medical History:  Diagnosis Date  . Acute pyelonephritis 05/07/2020  . Anxiety   . Chronic back pain   . Complication of anesthesia 1991 and 1998   childbirth --reaction to anesthesia was itching.   . DDD (degenerative disc disease)   . Depression   . Headache    migraines  . Hemorrhoids   . History of kidney stones   . Hyperlipidemia   . Hypertension   . Iron deficiency anemia 07/20/2020  . Kidney stone   . PONV (postoperative nausea and vomiting)   . Vitamin B12 deficiency 07/20/2020    Past Surgical History:   Procedure Laterality Date  . ADENOIDECTOMY  1978  . [place]   pt had itching with 1991 c-section.   . CHOLECYSTECTOMY    . COLONOSCOPY N/A 03/01/2013   Dr. Gala Romney- normal rectum except for anal canal/internal hemorrhoidal tags. colonic mucosa appeared normal.  . CYSTOSCOPY W/ URETERAL STENT PLACEMENT Left 12/09/2019   Procedure: CYSTOSCOPY WITH RETROGRADE PYELOGRAM/URETERAL STENT PLACEMENT;  Surgeon: Irine Seal, MD;  Location: [facility];  Service: Urology;  Laterality: Left;  . CYSTOSCOPY W/ URETERAL STENT PLACEMENT Right 05/07/2020   Procedure: CYSTOSCOPY WITH RETROGRADE PYELOGRAM/URETERAL STENT PLACEMENT;  Surgeon: Festus Aloe, MD;  Location: [facility];  Service: Urology;  Laterality: Right;  . CYSTOSCOPY WITH RETROGRADE PYELOGRAM, URETEROSCOPY AND STENT PLACEMENT Right 06/02/2020   Procedure: CYSTOSCOPY WITH RETROGRADE PYELOGRAM, URETEROSCOPY, BASKET EXTRACTION OF STONES AND STENT EXCHANGE;  Surgeon: Cleon Gustin, MD;  Location: [facility];  Service: Urology;  Laterality: Right;  . CYSTOSCOPY/URETEROSCOPY/HOLMIUM LASER/STENT PLACEMENT Left 12/17/2019   Procedure: LEFT URETEROSCOPY/STENT EXCHANGE/ EXTRACTION OF LEFT URETERAL CALCULI;  Surgeon: Irine Seal, MD;  Location: [facility];  Service: Urology;  Laterality: Left;  . KNEE ARTHROSCOPY Right 2003  . LITHOTRIPSY    . LUMBAR LAMINECTOMY/DECOMPRESSION MICRODISCECTOMY  03/02/2012   Procedure: LUMBAR LAMINECTOMY/DECOMPRESSION MICRODISCECTOMY 1 LEVEL;  Surgeon: Winfield Cunas, MD;  Location: [place] [facility];  Service: Neurosurgery;  Laterality: Left;  LEFT Lumbar Five-Sacral One Laminectomy for Decompression     Social History   Socioeconomic History  . Marital status: Single    Spouse name: Not on file  . Number of children: Not on file  . Years of education: Not on file  . Highest education level: Not on file  Occupational History  . Not on file  Tobacco Use  . Smoking status: Former Smoker    Packs/day: 0.25     Years: 30.00    Pack years: 7.50    Types: Cigarettes    Quit date: 2010    Years since quitting: 12.3  . Smokeless tobacco: Never Used  Vaping Use  . Vaping Use: Never used  Substance and Sexual Activity  . Alcohol use: Yes    Comment: rare  . Drug use: Never  . Sexual activity: Not Currently    Birth control/protection: Pill  Other Topics Concern  . Not on file  Social History Narrative   ** Merged History Encounter **       Social Determinants of Health   Financial Resource Strain: Not on file  Food Insecurity: Not on file  Transportation Needs: Not on file  Physical Activity: Not on file  Stress: Not on file  Social Connections: Not on file  Intimate Partner Violence: Not on file        Objective:    BP (!) 140/92   Pulse 83   Temp 97.8 F (36.6 C) (Temporal)   Ht _0  (1.575 m)   Wt 211 lb 3.2 oz (95.8 kg)   LMP 10/17/2019 (Approximate)   SpO2 98%   BMI 38.63 kg/m   Wt Readings from Last 3 Encounters:  10/28/20 211 lb 3.2 oz (95.8 kg)  10/07/20 215 lb (97.5 kg)  07/14/20 206 lb 3.2 oz (93.5 kg)    Physical Exam Vitals reviewed.  Constitutional:      General: She is not in acute distress.    Appearance: Normal appearance. She is morbidly obese. She is not ill-appearing, toxic-appearing or diaphoretic.  HENT:     Head: Normocephalic and atraumatic.  Eyes:     General: No scleral icterus.       Right eye: No discharge.        Left eye: No discharge.     Conjunctiva/sclera: Conjunctivae normal.  Cardiovascular:     Rate and Rhythm: Normal rate and regular rhythm.     Heart sounds: Normal heart sounds. No murmur heard. No friction rub. No gallop.   Pulmonary:     Effort: Pulmonary effort is normal. No respiratory distress.     Breath sounds: Normal breath sounds. No stridor. No wheezing, rhonchi or rales.  Musculoskeletal:        General: Normal range of motion.     Cervical back: Normal range of motion.  Feet:     Comments: Pain dorsal  midfoot bilaterally. Skin:    General: Skin is warm and dry.  Capillary Refill: Capillary refill takes less than 2 seconds.  Neurological:     General: No focal deficit present.     Mental Status: She is alert and oriented to person, place, and time. Mental status is at baseline.  Psychiatric:        Mood and Affect: Mood normal. Affect is tearful.        Behavior: Behavior normal.        Thought Content: Thought content normal.        Judgment: Judgment normal.     Lab Results  Component Value Date   TSH 0.553 10/28/2020   Lab Results  Component Value Date   WBC 11.6 (H) 07/14/2020   HGB 12.7 07/14/2020   HCT 36.8 07/14/2020   MCV 91 07/14/2020   PLT 276 07/14/2020   Lab Results  Component Value Date   NA 139 10/28/2020   K 4.3 10/28/2020   CO2 21 10/28/2020   GLUCOSE 89 10/28/2020   BUN 16 10/28/2020   CREATININE 1.06 (H) 10/28/2020   BILITOT <0.2 10/28/2020   ALKPHOS 85 10/28/2020   AST 14 10/28/2020   ALT 11 10/28/2020   PROT 7.3 10/28/2020   ALBUMIN 3.7 (L) 10/28/2020   CALCIUM 10.1 10/28/2020   ANIONGAP 6 05/09/2020   EGFR 63 10/28/2020   Lab Results  Component Value Date   CHOL 229 (H) 07/14/2020   Lab Results  Component Value Date   HDL 65 07/14/2020   Lab Results  Component Value Date   LDLCALC 148 (H) 07/14/2020   Lab Results  Component Value Date   TRIG 93 07/14/2020   Lab Results  Component Value Date   CHOLHDL 3.5 07/14/2020   No results found for: HGBA1C

## 2020-12-03 ENCOUNTER — Telehealth: Payer: Self-pay

## 2020-12-03 NOTE — Telephone Encounter (Signed)
Patient calling for a refill pain medication before July 22nd for kidney stones. Reports pain has increased. Message sent to MD

## 2020-12-07 MED ORDER — OXYCODONE-ACETAMINOPHEN 7.5-325 MG PO TABS: 1.0000 | ORAL_TABLET | ORAL | 0 refills | Status: DC | PRN

## 2020-12-07 NOTE — Telephone Encounter (Signed)
Patient called. No answer. Left message rx sent to pharmacy.

## 2020-12-09 ENCOUNTER — Encounter: Payer: Self-pay | Admitting: Family Medicine

## 2020-12-09 ENCOUNTER — Ambulatory Visit (INDEPENDENT_AMBULATORY_CARE_PROVIDER_SITE_OTHER): Payer: PRIVATE HEALTH INSURANCE | Admitting: Family Medicine

## 2020-12-09 ENCOUNTER — Other Ambulatory Visit: Payer: Self-pay

## 2020-12-09 VITALS — BP 113/77 | HR 84 | Temp 98.2°F | Ht 62.0 in | Wt 212.0 lb

## 2020-12-09 DIAGNOSIS — F419 Anxiety disorder, unspecified: Secondary | ICD-10-CM

## 2020-12-09 DIAGNOSIS — M51369 Other intervertebral disc degeneration, lumbar region without mention of lumbar back pain or lower extremity pain: Secondary | ICD-10-CM

## 2020-12-09 DIAGNOSIS — F332 Major depressive disorder, recurrent severe without psychotic features: Secondary | ICD-10-CM | POA: Diagnosis not present

## 2020-12-09 DIAGNOSIS — I10 Essential (primary) hypertension: Secondary | ICD-10-CM

## 2020-12-09 DIAGNOSIS — R232 Flushing: Secondary | ICD-10-CM

## 2020-12-09 DIAGNOSIS — D509 Iron deficiency anemia, unspecified: Secondary | ICD-10-CM

## 2020-12-09 DIAGNOSIS — M1990 Unspecified osteoarthritis, unspecified site: Secondary | ICD-10-CM

## 2020-12-09 DIAGNOSIS — M5136 Other intervertebral disc degeneration, lumbar region: Secondary | ICD-10-CM

## 2020-12-09 MED ORDER — MELOXICAM 7.5 MG PO TABS
7.5000 mg | ORAL_TABLET | Freq: Every day | ORAL | 2 refills | Status: DC
Start: 2020-12-09 — End: 2021-06-24

## 2020-12-09 NOTE — Patient Instructions (Addendum)
[facility] on [address] Phone: [phone]

## 2020-12-09 NOTE — Progress Notes (Signed)
Assessment & Plan:  1. Major depressive disorder, recurrent severe without psychotic features ([place]) Well controlled on current regimen.   2. Essential hypertension Well controlled on current regimen.   3. Anxiety Well controlled on current regimen.  4. Iron deficiency anemia, unspecified iron deficiency anemia type Will re-check lab work at next visit. Continue supplement.   5. Hot flashes Encouraged to resume OTC medication since it was helping.  6. Arthritis Started meloxicam for pain.  - meloxicam (MOBIC) 7.5 MG tablet; Take 1 tablet (7.5 mg total) by mouth daily.  Dispense: 30 tablet; Refill: 2  7. DDD (degenerative disc disease), lumbar - Ambulatory referral to Pain Clinic   Return in about 3 months (around 03/11/2021) for annual physical w. pap.  Hendricks Limes, MSN, APRN, FNP-C [facility]  Subjective:    Patient ID: Sandra Carroll, female    DOB: [birth year], 55 y.o.   MRN: [id number]  Patient Care Team: Loman Brooklyn, FNP as PCP - General (Family Medicine) Gala Romney, Cristopher Estimable, MD as Consulting Physician (Gastroenterology) Loman Brooklyn, FNP (Family Medicine)   Chief Complaint:  Chief Complaint  Patient presents with   Hypertension   Depression   Anxiety    6 week check up of chronic medical conditions    Hot Flashes    HPI: Sandra Carroll is a 55 y.o. female presenting on 12/09/2020 for Hypertension, Depression, Anxiety (6 week check up of chronic medical conditions ), and Hot Flashes  Depression: previously patient reported she quit taking Celexa due to the cost. This was resumed 6 weeks ago. She feels she is doing much better.   Depression screen [facility] 2/9 12/09/2020 10/28/2020 07/14/2020  Decreased Interest 2 3 2   Down, Depressed, Hopeless 0 3 2  PHQ - 2 Score 2 6 4   Altered sleeping 0 3 0  Tired, decreased energy 1 3 0  Change in appetite 0 0 0  Feeling bad or failure about yourself  0 3 -  Trouble concentrating 0 3 0   Moving slowly or fidgety/restless 0 3 0  Suicidal thoughts 0 3 0  PHQ-9 Score 3 24 4   Difficult doing work/chores Not difficult at all Not difficult at all Not difficult at all  Some recent data might be hidden   GAD 7 : Generalized Anxiety Score 12/09/2020 10/28/2020 07/14/2020 03/12/2020  Nervous, Anxious, on Edge 0 1 0 1  Control/stop worrying 0 1 0 2  Worry too much - different things 2 1 0 2  Trouble relaxing 0 3 0 1  Restless 0 2 0 1  Easily annoyed or irritable 0 1 0 0  Afraid - awful might happen 0 0 0 0  Total GAD 7 Score 2 9 0 7  Anxiety Difficulty Not difficult at all Not difficult at all Not difficult at all Somewhat difficult    Hypertension: patient has resumed her blood pressure medication.   Anemia: patient reports she is only taking her iron supplement every 2 or 3 days since it makes her poop green and she doesn't want to have green poop daily.    New complaints: Patient resumed OTC medication for hot flashes and reports an improvement in symptoms. She ran out a week ago and hot flashes returned yesterday.  Patient has arthritis in bilateral feet and is wondering what she can take.   Patient is requesting pain medication.    Social history:  Relevant past medical, surgical, family and social history reviewed and updated as  indicated. Interim medical history since our last visit reviewed.  Allergies and medications reviewed and updated.  DATA REVIEWED: CHART IN EPIC  ROS: Negative unless specifically indicated above in HPI.    Current Outpatient Medications:    citalopram (CELEXA) 20 MG tablet, Take 1 tablet (20 mg total) by mouth daily., Disp: 30 tablet, Rfl: 5   lisinopril (ZESTRIL) 20 MG tablet, Take 1 tablet (20 mg total) by mouth daily., Disp: 30 tablet, Rfl: 5   oxyCODONE-acetaminophen (PERCOCET) 7.5-325 MG tablet, Take 1 tablet by mouth every 4 (four) hours as needed for severe pain., Disp: 30 tablet, Rfl: 0   simvastatin (ZOCOR) 20 MG tablet, Take  1 tablet (20 mg total) by mouth daily at 6 PM., Disp: 30 tablet, Rfl: 5   No Known Allergies Past Medical History:  Diagnosis Date   Acute pyelonephritis 05/07/2020   Anxiety    Chronic back pain    Complication of anesthesia 1991 and 1998   childbirth --reaction to anesthesia was itching.    DDD (degenerative disc disease)    Depression    Headache    migraines   Hemorrhoids    History of kidney stones    Hyperlipidemia    Hypertension    Iron deficiency anemia 07/20/2020   Kidney stone    PONV (postoperative nausea and vomiting)    Vitamin B12 deficiency 07/20/2020    Past Surgical History:  Procedure Laterality Date   [place]   pt had itching with 1991 c-section.    CHOLECYSTECTOMY     COLONOSCOPY N/A 03/01/2013   Dr. Gala Romney- normal rectum except for anal canal/internal hemorrhoidal tags. colonic mucosa appeared normal.   CYSTOSCOPY W/ URETERAL STENT PLACEMENT Left 12/09/2019   Procedure: CYSTOSCOPY WITH RETROGRADE PYELOGRAM/URETERAL STENT PLACEMENT;  Surgeon: Irine Seal, MD;  Location: [facility];  Service: Urology;  Laterality: Left;   CYSTOSCOPY W/ URETERAL STENT PLACEMENT Right 05/07/2020   Procedure: CYSTOSCOPY WITH RETROGRADE PYELOGRAM/URETERAL STENT PLACEMENT;  Surgeon: Festus Aloe, MD;  Location: [facility];  Service: Urology;  Laterality: Right;   CYSTOSCOPY WITH RETROGRADE PYELOGRAM, URETEROSCOPY AND STENT PLACEMENT Right 06/02/2020   Procedure: CYSTOSCOPY WITH RETROGRADE PYELOGRAM, URETEROSCOPY, BASKET EXTRACTION OF STONES AND STENT EXCHANGE;  Surgeon: Cleon Gustin, MD;  Location: [facility];  Service: Urology;  Laterality: Right;   CYSTOSCOPY/URETEROSCOPY/HOLMIUM LASER/STENT PLACEMENT Left 12/17/2019   Procedure: LEFT URETEROSCOPY/STENT EXCHANGE/ EXTRACTION OF LEFT URETERAL CALCULI;  Surgeon: Irine Seal, MD;  Location: [facility];  Service: Urology;  Laterality: Left;   KNEE ARTHROSCOPY Right 2003   LITHOTRIPSY     LUMBAR  LAMINECTOMY/DECOMPRESSION MICRODISCECTOMY  03/02/2012   Procedure: LUMBAR LAMINECTOMY/DECOMPRESSION MICRODISCECTOMY 1 LEVEL;  Surgeon: Winfield Cunas, MD;  Location: [facility];  Service: Neurosurgery;  Laterality: Left;  LEFT Lumbar Five-Sacral One Laminectomy for Decompression     Social History   Socioeconomic History   Marital status: Single    Spouse name: Not on file   Number of children: Not on file   Years of education: Not on file   Highest education level: Not on file  Occupational History   Not on file  Tobacco Use   Smoking status: Former    Packs/day: 0.25    Years: 30.00    Pack years: 7.50    Types: Cigarettes    Quit date: 2010    Years since quitting: 12.4   Smokeless tobacco: Never  Vaping Use   Vaping Use: Never used  Substance  and Sexual Activity   Alcohol use: Yes    Comment: rare   Drug use: Never   Sexual activity: Not Currently    Birth control/protection: Pill  Other Topics Concern   Not on file  Social History Narrative   ** Merged History Encounter **       Social Determinants of Health   Financial Resource Strain: Not on file  Food Insecurity: Not on file  Transportation Needs: Not on file  Physical Activity: Not on file  Stress: Not on file  Social Connections: Not on file  Intimate Partner Violence: Not on file        Objective:    BP 113/77   Pulse 84   Temp 98.2 F (36.8 C) (Temporal)   Ht 5' 2"  (1.575 m)   Wt 212 lb (96.2 kg)   LMP 10/17/2019 (Approximate)   SpO2 94%   BMI 38.78 kg/m   Wt Readings from Last 3 Encounters:  12/09/20 212 lb (96.2 kg)  10/28/20 211 lb 3.2 oz (95.8 kg)  10/07/20 215 lb (97.5 kg)    Physical Exam Vitals reviewed.  Constitutional:      General: She is not in acute distress.    Appearance: Normal appearance. She is morbidly obese. She is not ill-appearing, toxic-appearing or diaphoretic.  HENT:     Head: Normocephalic and atraumatic.  Eyes:     General: No scleral icterus.        Right eye: No discharge.        Left eye: No discharge.     Conjunctiva/sclera: Conjunctivae normal.  Cardiovascular:     Rate and Rhythm: Normal rate.  Pulmonary:     Effort: Pulmonary effort is normal. No respiratory distress.  Musculoskeletal:        General: Normal range of motion.     Cervical back: Normal range of motion.  Skin:    General: Skin is warm and dry.     Capillary Refill: Capillary refill takes less than 2 seconds.  Neurological:     General: No focal deficit present.     Mental Status: She is alert and oriented to person, place, and time. Mental status is at baseline.  Psychiatric:        Mood and Affect: Mood normal.        Behavior: Behavior normal.        Thought Content: Thought content normal.        Judgment: Judgment normal.    Lab Results  Component Value Date   TSH 0.553 10/28/2020   Lab Results  Component Value Date   WBC 11.6 (H) 07/14/2020   HGB 12.7 07/14/2020   HCT 36.8 07/14/2020   MCV 91 07/14/2020   PLT 276 07/14/2020   Lab Results  Component Value Date   NA 139 10/28/2020   K 4.3 10/28/2020   CO2 21 10/28/2020   GLUCOSE 89 10/28/2020   BUN 16 10/28/2020   CREATININE 1.06 (H) 10/28/2020   BILITOT <0.2 10/28/2020   ALKPHOS 85 10/28/2020   AST 14 10/28/2020   ALT 11 10/28/2020   PROT 7.3 10/28/2020   ALBUMIN 3.7 (L) 10/28/2020   CALCIUM 10.1 10/28/2020   ANIONGAP 6 05/09/2020   EGFR 63 10/28/2020   Lab Results  Component Value Date   CHOL 229 (H) 07/14/2020   Lab Results  Component Value Date   HDL 65 07/14/2020   Lab Results  Component Value Date   LDLCALC 148 (H) 07/14/2020   Lab  Results  Component Value Date   TRIG 93 07/14/2020   Lab Results  Component Value Date   CHOLHDL 3.5 07/14/2020   No results found for: HGBA1C

## 2020-12-29 ENCOUNTER — Telehealth: Payer: Self-pay | Admitting: Family Medicine

## 2020-12-29 NOTE — Telephone Encounter (Signed)
It looks like it was taken off because she wasn't taking, but I wonder if it was that she wasn't needing it. If we can get it approved, that would be good.

## 2020-12-29 NOTE — Telephone Encounter (Signed)
Not covered - has been tried before

## 2020-12-29 NOTE — Telephone Encounter (Signed)
We received PA on nurtec for patient. I do not see on her current medication list. Do we need to complete?

## 2020-12-30 ENCOUNTER — Other Ambulatory Visit: Payer: Self-pay

## 2020-12-30 NOTE — Telephone Encounter (Signed)
Patient called today for more pain medication. Currently has been taking 7.5mg  oxycodone with no relief. Pt states she is unable to work  Scheduled for renal u/s and kub on 07/14 with f/u appt scheduled to see MD 07/20  Pt also stated she is ready to move forward with surgery for kidney stones depending of her imaging.

## 2020-12-31 ENCOUNTER — Ambulatory Visit (HOSPITAL_COMMUNITY)
Admission: RE | Admit: 2020-12-31 | Discharge: 2020-12-31 | Disposition: A | Discharge status: Home / Self Care | Payer: PRIVATE HEALTH INSURANCE | Source: Ambulatory Visit | Attending: Urology | Admitting: Urology

## 2020-12-31 ENCOUNTER — Other Ambulatory Visit: Payer: Self-pay

## 2020-12-31 DIAGNOSIS — N200 Calculus of kidney: Secondary | ICD-10-CM | POA: Diagnosis present

## 2021-01-04 NOTE — Telephone Encounter (Signed)
Patient called back to check on the status of refill of  oxyCODONE-acetaminophen (PERCOCET) Patient says medication is not helping with pain.  Needing a higher dose. Please advise.  Thanks, Rosey Bath

## 2021-01-05 NOTE — Telephone Encounter (Signed)
Patient asking for a higher

## 2021-01-06 ENCOUNTER — Ambulatory Visit: Payer: PRIVATE HEALTH INSURANCE | Admitting: Urology

## 2021-01-08 MED ORDER — OXYCODONE-ACETAMINOPHEN 7.5-325 MG PO TABS: 1.0000 | ORAL_TABLET | ORAL | 0 refills | Status: DC | PRN

## 2021-01-13 ENCOUNTER — Other Ambulatory Visit: Payer: Self-pay | Admitting: Urology

## 2021-01-13 MED ORDER — OXYCODONE-ACETAMINOPHEN 10-325 MG PO TABS: 1.0000 | ORAL_TABLET | ORAL | 0 refills | Status: DC | PRN

## 2021-01-19 ENCOUNTER — Telehealth: Payer: PRIVATE HEALTH INSURANCE | Admitting: Urology

## 2021-02-16 ENCOUNTER — Encounter: Payer: Self-pay | Admitting: Urology

## 2021-02-16 ENCOUNTER — Other Ambulatory Visit: Payer: Self-pay

## 2021-02-16 ENCOUNTER — Telehealth (INDEPENDENT_AMBULATORY_CARE_PROVIDER_SITE_OTHER): Payer: Medicaid Other | Admitting: Urology

## 2021-02-16 DIAGNOSIS — N200 Calculus of kidney: Secondary | ICD-10-CM

## 2021-02-16 MED ORDER — OXYCODONE-ACETAMINOPHEN 10-325 MG PO TABS: 1.0000 | ORAL_TABLET | ORAL | 0 refills | Status: DC | PRN

## 2021-02-16 NOTE — H&P (View-Only) (Signed)
02/16/2021 4:03 PM   Sandra Carroll 07-Oct-1965 [id number]  Referring provider: Gwenlyn Fudge, FNP [address] [place],  [place] [address]  Patient location: home Physician location: office I connected with  Sandra Carroll on 02/16/21 by a video enabled telemedicine application and verified that I am speaking with the correct person using two identifiers.   I discussed the limitations of evaluation and management by telemedicine. The patient expressed understanding and agreed to proceed.    Followup nephrolithiasis  HPI: Sandra Carroll is a 54yo here for followup for nephrolithiasis. NO stone events since last visit. Renal US and KUB shows stable bilateral renal calculi. She has intermittent bilateral flank pain that is sharp, intermittent and can last several minutes. Pain alleviated with narcotics. Patient is requesting surgery to remove calculi.    PMH: Past Medical History:  Diagnosis Date   Acute pyelonephritis 05/07/2020   Anxiety    Chronic back pain    Complication of anesthesia 1991 and 1998   childbirth --reaction to anesthesia was itching.    DDD (degenerative disc disease)    Depression    Headache    migraines   Hemorrhoids    History of kidney stones    Hyperlipidemia    Hypertension    Iron deficiency anemia 07/20/2020   Kidney stone    PONV (postoperative nausea and vomiting)    Vitamin B12 deficiency 07/20/2020    Surgical History: Past Surgical History:  Procedure Laterality Date   ADENOIDECTOMY  1978   CESAREAN SECTION  1991 1998   pt had itching with 1991 c-section.    CHOLECYSTECTOMY     COLONOSCOPY N/A 03/01/2013   Dr. Jena Carroll- normal rectum except for anal canal/internal hemorrhoidal tags. colonic mucosa appeared normal.   CYSTOSCOPY W/ URETERAL STENT PLACEMENT Left 12/09/2019   Procedure: CYSTOSCOPY WITH RETROGRADE PYELOGRAM/URETERAL STENT PLACEMENT;  Surgeon: Sandra Pippin, MD;  Location: [facility];  Service: Urology;  Laterality: Left;    CYSTOSCOPY W/ URETERAL STENT PLACEMENT Right 05/07/2020   Procedure: CYSTOSCOPY WITH RETROGRADE PYELOGRAM/URETERAL STENT PLACEMENT;  Surgeon: Sandra Field, MD;  Location: [facility];  Service: Urology;  Laterality: Right;   CYSTOSCOPY WITH RETROGRADE PYELOGRAM, URETEROSCOPY AND STENT PLACEMENT Right 06/02/2020   Procedure: CYSTOSCOPY WITH RETROGRADE PYELOGRAM, URETEROSCOPY, BASKET EXTRACTION OF STONES AND STENT EXCHANGE;  Surgeon: Sandra Gauze, MD;  Location: [facility];  Service: Urology;  Laterality: Right;   CYSTOSCOPY/URETEROSCOPY/HOLMIUM LASER/STENT PLACEMENT Left 12/17/2019   Procedure: LEFT URETEROSCOPY/STENT EXCHANGE/ EXTRACTION OF LEFT URETERAL CALCULI;  Surgeon: Sandra Pippin, MD;  Location: [facility];  Service: Urology;  Laterality: Left;   KNEE ARTHROSCOPY Right 2003   LITHOTRIPSY     LUMBAR LAMINECTOMY/DECOMPRESSION MICRODISCECTOMY  03/02/2012   Procedure: LUMBAR LAMINECTOMY/DECOMPRESSION MICRODISCECTOMY 1 LEVEL;  Surgeon: Sandra Hurt, MD;  Location: [facility];  Service: Neurosurgery;  Laterality: Left;  LEFT Lumbar Five-Sacral One Laminectomy for Decompression     Home Medications:  Allergies as of 02/16/2021   No Known Allergies      Medication List        Accurate as of February 16, 2021  4:03 PM. If you have any questions, ask your nurse or doctor.          citalopram 20 MG tablet Commonly known as: CeleXA Take 1 tablet (20 mg total) by mouth daily.   lisinopril 20 MG tablet Commonly known as: ZESTRIL Take 1 tablet (20 mg total) by mouth daily.   meloxicam 7.5 MG tablet Commonly known as: MOBIC  Take 1 tablet (7.5 mg total) by mouth daily.   oxyCODONE-acetaminophen 10-325 MG tablet Commonly known as: Percocet Take 1 tablet by mouth every 4 (four) hours as needed for pain.   simvastatin 20 MG tablet Commonly known as: ZOCOR Take 1 tablet (20 mg total) by mouth daily at 6 PM.        Allergies: No Known Allergies  Family History: Family History   Problem Relation Age of Onset   Hypertension Mother    Cancer Father    Hypertension Father    Colon cancer Neg Hx     Social History:  reports that she quit smoking about 12 years ago. Her smoking use included cigarettes. She has a 7.50 pack-year smoking history. She has never used smokeless tobacco. She reports current alcohol use. She reports that she does not use drugs.  ROS: All other review of systems were reviewed and are negative except what is noted above in HPI   Laboratory Data: Lab Results  Component Value Date   WBC 11.6 (H) 07/14/2020   HGB 12.7 07/14/2020   HCT 36.8 07/14/2020   MCV 91 07/14/2020   PLT 276 07/14/2020    Lab Results  Component Value Date   CREATININE 1.06 (H) 10/28/2020    No results found for: PSA  No results found for: TESTOSTERONE  No results found for: HGBA1C  Urinalysis    Component Value Date/Time   COLORURINE YELLOW 05/07/2020 1233   APPEARANCEUR Clear 07/08/2020 1408   LABSPEC 1.013 05/07/2020 1233   PHURINE 6.0 05/07/2020 1233   GLUCOSEU Negative 07/08/2020 1408   HGBUR SMALL (A) 05/07/2020 1233   BILIRUBINUR Negative 07/08/2020 1408   KETONESUR NEGATIVE 05/07/2020 1233   PROTEINUR Negative 07/08/2020 1408   PROTEINUR 100 (A) 05/07/2020 1233   UROBILINOGEN 0.2 07/25/2014 0215   NITRITE Negative 07/08/2020 1408   NITRITE NEGATIVE 05/07/2020 1233   LEUKOCYTESUR Negative 07/08/2020 1408   LEUKOCYTESUR LARGE (A) 05/07/2020 1233    Lab Results  Component Value Date   LABMICR Comment 07/08/2020   WBCUA 6-10 (A) 06/10/2020   LABEPIT 0-10 06/10/2020   MUCUS Present 06/10/2020   BACTERIA Moderate (A) 06/10/2020    Pertinent Imaging: Renal US and KUB: Images reviewed and discussed with the patient Results for orders placed during the hospital encounter of 12/31/20  Abdomen 1 view (KUB)  Narrative CLINICAL DATA:  Nephrolithiasis.  EXAM: ABDOMEN - 1 VIEW  COMPARISON:  None.  FINDINGS: Multiple calcifications  in the pelvis. Both kidneys are largely obscured by bowel contents. No definite renal stones are seen on this study. No ureteral stones identified. No other abnormalities.  IMPRESSION: The kidneys are largely obscured by bowel contents but no stones are seen.  Calcifications in the pelvis are nonspecific but may all be phleboliths. No other evidence of ureteral stone.   Electronically Signed By: Gerome Sam III M.D On: 01/03/2021 19:43  No results found for this or any previous visit.  No results found for this or any previous visit.  No results found for this or any previous visit.  Results for orders placed during the hospital encounter of 12/31/20  Ultrasound renal complete  Narrative CLINICAL DATA:  Nephrolithiasis.  Follow-up kidney stones.  EXAM: RENAL / URINARY TRACT ULTRASOUND COMPLETE  COMPARISON:  September 30, 2020  FINDINGS: Right Kidney:  Renal measurements: 11.2 x 5.4 x 6.5 cm = volume: 205.2 mL. There is a 7.3 mm stone in the lower right kidney. The second stone seen previously is  no longer visualized.  Left Kidney:  Renal measurements: 10.0 x 4.8 x 4.7 cm = volume: 115.8 mL. Mild fullness of the left renal pelvis without gross caliectasis. There is a 5.9 mm stone in the mid left kidney.  Bladder:  Poorly distended limiting evaluation.  No abnormalities noted.  Other:  None.  IMPRESSION: 1. Bilateral renal stones. No obstruction on the right. Mild fullness of the left renal pelvis on the left without gross hydronephrosis. Recommend clinical correlation.   Electronically Signed By: Gerome Sam III M.D On: 01/03/2021 19:46  No results found for this or any previous visit.  No results found for this or any previous visit.  Results for orders placed during the hospital encounter of 05/07/20  CT Renal Stone Study  Narrative CLINICAL DATA:  Right-sided lower back pain  EXAM: CT ABDOMEN AND PELVIS WITHOUT  CONTRAST  TECHNIQUE: Multidetector CT imaging of the abdomen and pelvis was performed following the standard protocol without IV contrast.  COMPARISON:  None.  FINDINGS: Lower chest: The visualized heart size within normal limits. No pericardial fluid/thickening.  No hiatal hernia.  The visualized portions of the lungs are clear.  Hepatobiliary: Although limited due to the lack of intravenous contrast, normal in appearance without gross focal abnormality. The patient is status post cholecystectomy. No biliary ductal dilation.  Pancreas:  Unremarkable.  No surrounding inflammatory changes.  Spleen: Normal in size. Although limited due to the lack of intravenous contrast, normal in appearance.  Adrenals/Urinary Tract: Both adrenal glands appear normal. Moderate right pelvicaliectasis and ureterectasis is seen down the level of the distal ureter where there is several ureteral calculi 1 measuring 3 mm and 1 measuring 4 mm. There is right-sided perinephric and proximal periureteral stranding changes. There is a tiny punctate calcification in the posterior right bladder. There is also a a 3 mm calculus seen in the mid pole the right kidney. No left-sided renal or collecting system calculi are noted.  Stomach/Bowel: The stomach, small bowel, and colon are normal in appearance. No inflammatory changes or obstructive findings. Scattered colonic diverticula are noted.  Vascular/Lymphatic: There are no enlarged abdominal or pelvic lymph nodes. Scattered aortic atherosclerotic calcifications are seen without aneurysmal dilatation.  Reproductive: The uterus and adnexa are unremarkable.  Other: Bilateral fat containing inguinal hernias are noted.  Musculoskeletal: No acute or significant osseous findings.  IMPRESSION: Moderate right hydronephrosis to the distal ureter where there is several calcified ureteral calculi measuring 3 mm and 4 mm. There is also punctate calcification  in the posterior right bladder.  Right-sided perinephric and periureteral stranding which could be from the recently passed stone versus pyelonephritis.  Nonobstructing right renal calculus.   Electronically Signed By: Jonna Clark M.D. On: 05/07/2020 16:09   Assessment & Plan:    1. Nephrolithiasis -We discussed the management of kidney stones. These options include observation, ureteroscopy, shockwave lithotripsy (ESWL) and percutaneous nephrolithotomy (PCNL). We discussed which options are relevant to the patient's stone(s). We discussed the natural history of kidney stones as well as the complications of untreated stones and the impact on quality of life without treatment as well as with each of the above listed treatments. We also discussed the efficacy of each treatment in its ability to clear the stone burden. With any of these management options I discussed the signs and symptoms of infection and the need for emergent treatment should these be experienced. For each option we discussed the ability of each procedure to clear the patient of their stone  burden.   For observation I described the risks which include but are not limited to silent renal damage, life-threatening infection, need for emergent surgery, failure to pass stone and pain.   For ureteroscopy I described the risks which include bleeding, infection, damage to contiguous structures, positioning injury, ureteral stricture, ureteral avulsion, ureteral injury, need for prolonged ureteral stent, inability to perform ureteroscopy, need for an interval procedure, inability to clear stone burden, stent discomfort/pain, heart attack, stroke, pulmonary embolus and the inherent risks with general anesthesia.   For shockwave lithotripsy I described the risks which include arrhythmia, kidney contusion, kidney hemorrhage, need for transfusion, pain, inability to adequately break up stone, inability to pass stone fragments, Steinstr[place],  infection associated with obstructing stones, need for alternate surgical procedure, need for repeat shockwave lithotripsy, MI, CVA, PE and the inherent risks with anesthesia/conscious sedation.   For PCNL I described the risks including positioning injury, pneumothorax, hydrothorax, need for chest tube, inability to clear stone burden, renal laceration, arterial venous fistula or malformation, need for embolization of kidney, loss of kidney or renal function, need for repeat procedure, need for prolonged nephrostomy tube, ureteral avulsion, MI, CVA, PE and the inherent risks of general anesthesia.   - The patient would like to proceed with bilateral ureteroscopic stone extraction   No follow-ups on file.  Wilkie AyePatrick Chesley Veasey, MD  [facility] [place]

## 2021-02-16 NOTE — Patient Instructions (Signed)
Textbook of Natural Medicine (5th ed., pp. 1518-1527.e3). [place], [place]: Elsevier.">  Dietary Guidelines to Help Prevent Kidney Stones Kidney stones are deposits of minerals and salts that form inside your kidneys. Your risk of developing kidney stones may be greater depending on your diet, your lifestyle, the medicines you take, and whether you have certain medical conditions. Most people can lower their chances of developing kidney stones by following the instructions below. Your dietitian may give you more specific instructions depending on your overall health and the type of kidney stones youtend to develop. What are tips for following this plan? Reading food labels  Choose foods with "no salt added" or "low-salt" labels. Limit your salt (sodium) intake to less than 1,500 mg a day. Choose foods with calcium for each meal and snack. Try to eat about 300 mg of calcium at each meal. Foods that contain 200-500 mg of calcium a serving include: 8 oz (237 mL) of milk, calcium-fortifiednon-dairy milk, and calcium-fortifiedfruit juice. Calcium-fortified means that calcium has been added to these drinks. 8 oz (237 mL) of kefir, yogurt, and soy yogurt. 4 oz (114 g) of tofu. 1 oz (28 g) of cheese. 1 cup (150 g) of dried figs. 1 cup (91 g) of cooked broccoli. One 3 oz (85 g) can of sardines or mackerel. Most people need 1,000-1,500 mg of calcium a day. Talk to your dietitian abouthow much calcium is recommended for you. Shopping Buy plenty of fresh fruits and vegetables. Most people do not need to avoid fruits and vegetables, even if these foods contain nutrients that may contribute to kidney stones. When shopping for convenience foods, choose: Whole pieces of fruit. Pre-made salads with dressing on the side. Low-fat fruit and yogurt smoothies. Avoid buying frozen meals or prepared deli foods. These can be high in sodium. Look for foods with live cultures, such as yogurt and kefir. Choose high-fiber  grains, such as whole-wheat breads, oat bran, and wheat cereals. Cooking Do not add salt to food when cooking. Place a salt shaker on the table and allow each person to add his or her own salt to taste. Use vegetable protein, such as beans, textured vegetable protein (TVP), or tofu, instead of meat in pasta, casseroles, and soups. Meal planning Eat less salt, if told by your dietitian. To do this: Avoid eating processed or pre-made food. Avoid eating fast food. Eat less animal protein, including cheese, meat, poultry, or fish, if told by your dietitian. To do this: Limit the number of times you have meat, poultry, fish, or cheese each week. Eat a diet free of meat at least 2 days a week. Eat only one serving each day of meat, poultry, fish, or seafood. When you prepare animal protein, cut pieces into small portion sizes. For most meat and fish, one serving is about the size of the palm of your hand. Eat at least five servings of fresh fruits and vegetables each day. To do this: Keep fruits and vegetables on hand for snacks. Eat one piece of fruit or a handful of berries with breakfast. Have a salad and fruit at lunch. Have two kinds of vegetables at dinner. Limit foods that are high in a substance called oxalate. These include: Spinach (cooked), rhubarb, beets, sweet potatoes, and Swiss chard. Peanuts. Potato chips, french fries, and baked potatoes with skin on. Nuts and nut products. Chocolate. If you regularly take a diuretic medicine, make sure to eat at least 1 or 2 servings of fruits or vegetables that are   high in potassium each day. These include: Avocado. Banana. Orange, prune, carrot, or tomato juice. Baked potato. Cabbage. Beans and split peas. Lifestyle  Drink enough fluid to keep your urine pale yellow. This is the most important thing you can do. Spread your fluid intake throughout the day. If you drink alcohol: Limit how much you use to: 0-1 drink a day for women who  are not pregnant. 0-2 drinks a day for men. Be aware of how much alcohol is in your drink. In the [place] one drink equals one 12 oz bottle of beer (355 mL), one 5 oz glass of wine (148 mL), or one 1 oz glass of hard liquor (44 mL). Lose weight if told by your health care provider. Work with your dietitian to find an eating plan and weight loss strategies that work best for you.  General information Talk to your health care provider and dietitian about taking daily supplements. You may be told the following depending on your health and the cause of your kidney stones: Not to take supplements with vitamin C. To take a calcium supplement. To take a daily probiotic supplement. To take other supplements such as magnesium, fish oil, or vitamin B6. Take over-the-counter and prescription medicines only as told by your health care provider. These include supplements. What foods should I limit? Limit your intake of the following foods, or eat them as told by your dietitian. Vegetables Spinach. Rhubarb. Beets. Canned vegetables. Pickles. Olives. Baked potatoeswith skin. Grains Wheat bran. Baked goods. Salted crackers. Cereals high in sugar. Meats and other proteins Nuts. Nut butters. Large portions of meat, poultry, or fish. Salted, precooked,or cured meats, such as sausages, meat loaves, and hot dogs. Dairy Cheese. Beverages Regular soft drinks. Regular vegetable juice. Seasonings and condiments Seasoning blends with salt. Salad dressings. Soy sauce. Ketchup. Barbecue sauce. Other foods Canned soups. Canned pasta sauce. Casseroles. Pizza. Lasagna. Frozen meals.Potato chips. French fries. The items listed above may not be a complete list of foods and beverages you should limit. Contact a dietitian for more information. What foods should I avoid? Talk to your dietitian about specific foods you should avoid based on the typeof kidney stones you have and your overall health. Fruits Grapefruit. The  item listed above may not be a complete list of foods and beverages you should avoid. Contact a dietitian for more information. Summary Kidney stones are deposits of minerals and salts that form inside your kidneys. You can lower your risk of kidney stones by making changes to your diet. The most important thing you can do is drink enough fluid. Drink enough fluid to keep your urine pale yellow. Talk to your dietitian about how much calcium you should have each day, and eat less salt and animal protein as told by your dietitian. This information is not intended to replace advice given to you by your health care provider. Make sure you discuss any questions you have with your healthcare provider. Document Revised: 05/30/2019 Document Reviewed: 05/30/2019 Elsevier Patient Education  2022 Elsevier Inc.  

## 2021-02-16 NOTE — Progress Notes (Signed)
02/16/2021 4:03 PM   Sandra Carroll 07-Oct-1965 [id number]  Referring provider: Gwenlyn Fudge, FNP [address] [place],  [place] [address]  Patient location: home Physician location: office I connected with  Sandra Carroll on 02/16/21 by a video enabled telemedicine application and verified that I am speaking with the correct person using two identifiers.   I discussed the limitations of evaluation and management by telemedicine. The patient expressed understanding and agreed to proceed.    Followup nephrolithiasis  HPI: Sandra Carroll is a 55yo here for followup for nephrolithiasis. NO stone events since last visit. Renal US and KUB shows stable bilateral renal calculi. She has intermittent bilateral flank pain that is sharp, intermittent and can last several minutes. Pain alleviated with narcotics. Patient is requesting surgery to remove calculi.    PMH: Past Medical History:  Diagnosis Date   Acute pyelonephritis 05/07/2020   Anxiety    Chronic back pain    Complication of anesthesia 1991 and 1998   childbirth --reaction to anesthesia was itching.    DDD (degenerative disc disease)    Depression    Headache    migraines   Hemorrhoids    History of kidney stones    Hyperlipidemia    Hypertension    Iron deficiency anemia 07/20/2020   Kidney stone    PONV (postoperative nausea and vomiting)    Vitamin B12 deficiency 07/20/2020    Surgical History: Past Surgical History:  Procedure Laterality Date   ADENOIDECTOMY  1978   CESAREAN SECTION  1991 1998   pt had itching with 1991 c-section.    CHOLECYSTECTOMY     COLONOSCOPY N/A 03/01/2013   Dr. Jena Gauss- normal rectum except for anal canal/internal hemorrhoidal tags. colonic mucosa appeared normal.   CYSTOSCOPY W/ URETERAL STENT PLACEMENT Left 12/09/2019   Procedure: CYSTOSCOPY WITH RETROGRADE PYELOGRAM/URETERAL STENT PLACEMENT;  Surgeon: Bjorn Pippin, MD;  Location: [facility];  Service: Urology;  Laterality: Left;    CYSTOSCOPY W/ URETERAL STENT PLACEMENT Right 05/07/2020   Procedure: CYSTOSCOPY WITH RETROGRADE PYELOGRAM/URETERAL STENT PLACEMENT;  Surgeon: Jerilee Field, MD;  Location: [facility];  Service: Urology;  Laterality: Right;   CYSTOSCOPY WITH RETROGRADE PYELOGRAM, URETEROSCOPY AND STENT PLACEMENT Right 06/02/2020   Procedure: CYSTOSCOPY WITH RETROGRADE PYELOGRAM, URETEROSCOPY, BASKET EXTRACTION OF STONES AND STENT EXCHANGE;  Surgeon: Malen Gauze, MD;  Location: [facility];  Service: Urology;  Laterality: Right;   CYSTOSCOPY/URETEROSCOPY/HOLMIUM LASER/STENT PLACEMENT Left 12/17/2019   Procedure: LEFT URETEROSCOPY/STENT EXCHANGE/ EXTRACTION OF LEFT URETERAL CALCULI;  Surgeon: Bjorn Pippin, MD;  Location: [facility];  Service: Urology;  Laterality: Left;   KNEE ARTHROSCOPY Right 2003   LITHOTRIPSY     LUMBAR LAMINECTOMY/DECOMPRESSION MICRODISCECTOMY  03/02/2012   Procedure: LUMBAR LAMINECTOMY/DECOMPRESSION MICRODISCECTOMY 1 LEVEL;  Surgeon: Carmela Hurt, MD;  Location: [facility];  Service: Neurosurgery;  Laterality: Left;  LEFT Lumbar Five-Sacral One Laminectomy for Decompression     Home Medications:  Allergies as of 02/16/2021   No Known Allergies      Medication List        Accurate as of February 16, 2021  4:03 PM. If you have any questions, ask your nurse or doctor.          citalopram 20 MG tablet Commonly known as: CeleXA Take 1 tablet (20 mg total) by mouth daily.   lisinopril 20 MG tablet Commonly known as: ZESTRIL Take 1 tablet (20 mg total) by mouth daily.   meloxicam 7.5 MG tablet Commonly known as: MOBIC  Take 1 tablet (7.5 mg total) by mouth daily.   oxyCODONE-acetaminophen 10-325 MG tablet Commonly known as: Percocet Take 1 tablet by mouth every 4 (four) hours as needed for pain.   simvastatin 20 MG tablet Commonly known as: ZOCOR Take 1 tablet (20 mg total) by mouth daily at 6 PM.        Allergies: No Known Allergies  Family History: Family History   Problem Relation Age of Onset   Hypertension Mother    Cancer Father    Hypertension Father    Colon cancer Neg Hx     Social History:  reports that she quit smoking about 12 years ago. Her smoking use included cigarettes. She has a 7.50 pack-year smoking history. She has never used smokeless tobacco. She reports current alcohol use. She reports that she does not use drugs.  ROS: All other review of systems were reviewed and are negative except what is noted above in HPI   Laboratory Data: Lab Results  Component Value Date   WBC 11.6 (H) 07/14/2020   HGB 12.7 07/14/2020   HCT 36.8 07/14/2020   MCV 91 07/14/2020   PLT 276 07/14/2020    Lab Results  Component Value Date   CREATININE 1.06 (H) 10/28/2020    No results found for: PSA  No results found for: TESTOSTERONE  No results found for: HGBA1C  Urinalysis    Component Value Date/Time   COLORURINE YELLOW 05/07/2020 1233   APPEARANCEUR Clear 07/08/2020 1408   LABSPEC 1.013 05/07/2020 1233   PHURINE 6.0 05/07/2020 1233   GLUCOSEU Negative 07/08/2020 1408   HGBUR SMALL (A) 05/07/2020 1233   BILIRUBINUR Negative 07/08/2020 1408   KETONESUR NEGATIVE 05/07/2020 1233   PROTEINUR Negative 07/08/2020 1408   PROTEINUR 100 (A) 05/07/2020 1233   UROBILINOGEN 0.2 07/25/2014 0215   NITRITE Negative 07/08/2020 1408   NITRITE NEGATIVE 05/07/2020 1233   LEUKOCYTESUR Negative 07/08/2020 1408   LEUKOCYTESUR LARGE (A) 05/07/2020 1233    Lab Results  Component Value Date   LABMICR Comment 07/08/2020   WBCUA 6-10 (A) 06/10/2020   LABEPIT 0-10 06/10/2020   MUCUS Present 06/10/2020   BACTERIA Moderate (A) 06/10/2020    Pertinent Imaging: Renal US and KUB: Images reviewed and discussed with the patient Results for orders placed during the hospital encounter of 12/31/20  Abdomen 1 view (KUB)  Narrative CLINICAL DATA:  Nephrolithiasis.  EXAM: ABDOMEN - 1 VIEW  COMPARISON:  None.  FINDINGS: Multiple calcifications  in the pelvis. Both kidneys are largely obscured by bowel contents. No definite renal stones are seen on this study. No ureteral stones identified. No other abnormalities.  IMPRESSION: The kidneys are largely obscured by bowel contents but no stones are seen.  Calcifications in the pelvis are nonspecific but may all be phleboliths. No other evidence of ureteral stone.   Electronically Signed By: Gerome Sam III M.D On: 01/03/2021 19:43  No results found for this or any previous visit.  No results found for this or any previous visit.  No results found for this or any previous visit.  Results for orders placed during the hospital encounter of 12/31/20  Ultrasound renal complete  Narrative CLINICAL DATA:  Nephrolithiasis.  Follow-up kidney stones.  EXAM: RENAL / URINARY TRACT ULTRASOUND COMPLETE  COMPARISON:  September 30, 2020  FINDINGS: Right Kidney:  Renal measurements: 11.2 x 5.4 x 6.5 cm = volume: 205.2 mL. There is a 7.3 mm stone in the lower right kidney. The second stone seen previously is  no longer visualized.  Left Kidney:  Renal measurements: 10.0 x 4.8 x 4.7 cm = volume: 115.8 mL. Mild fullness of the left renal pelvis without gross caliectasis. There is a 5.9 mm stone in the mid left kidney.  Bladder:  Poorly distended limiting evaluation.  No abnormalities noted.  Other:  None.  IMPRESSION: 1. Bilateral renal stones. No obstruction on the right. Mild fullness of the left renal pelvis on the left without gross hydronephrosis. Recommend clinical correlation.   Electronically Signed By: Gerome Sam III M.D On: 01/03/2021 19:46  No results found for this or any previous visit.  No results found for this or any previous visit.  Results for orders placed during the hospital encounter of 05/07/20  CT Renal Stone Study  Narrative CLINICAL DATA:  Right-sided lower back pain  EXAM: CT ABDOMEN AND PELVIS WITHOUT  CONTRAST  TECHNIQUE: Multidetector CT imaging of the abdomen and pelvis was performed following the standard protocol without IV contrast.  COMPARISON:  None.  FINDINGS: Lower chest: The visualized heart size within normal limits. No pericardial fluid/thickening.  No hiatal hernia.  The visualized portions of the lungs are clear.  Hepatobiliary: Although limited due to the lack of intravenous contrast, normal in appearance without gross focal abnormality. The patient is status post cholecystectomy. No biliary ductal dilation.  Pancreas:  Unremarkable.  No surrounding inflammatory changes.  Spleen: Normal in size. Although limited due to the lack of intravenous contrast, normal in appearance.  Adrenals/Urinary Tract: Both adrenal glands appear normal. Moderate right pelvicaliectasis and ureterectasis is seen down the level of the distal ureter where there is several ureteral calculi 1 measuring 3 mm and 1 measuring 4 mm. There is right-sided perinephric and proximal periureteral stranding changes. There is a tiny punctate calcification in the posterior right bladder. There is also a a 3 mm calculus seen in the mid pole the right kidney. No left-sided renal or collecting system calculi are noted.  Stomach/Bowel: The stomach, small bowel, and colon are normal in appearance. No inflammatory changes or obstructive findings. Scattered colonic diverticula are noted.  Vascular/Lymphatic: There are no enlarged abdominal or pelvic lymph nodes. Scattered aortic atherosclerotic calcifications are seen without aneurysmal dilatation.  Reproductive: The uterus and adnexa are unremarkable.  Other: Bilateral fat containing inguinal hernias are noted.  Musculoskeletal: No acute or significant osseous findings.  IMPRESSION: Moderate right hydronephrosis to the distal ureter where there is several calcified ureteral calculi measuring 3 mm and 4 mm. There is also punctate calcification  in the posterior right bladder.  Right-sided perinephric and periureteral stranding which could be from the recently passed stone versus pyelonephritis.  Nonobstructing right renal calculus.   Electronically Signed By: Jonna Clark M.D. On: 05/07/2020 16:09   Assessment & Plan:    1. Nephrolithiasis -We discussed the management of kidney stones. These options include observation, ureteroscopy, shockwave lithotripsy (ESWL) and percutaneous nephrolithotomy (PCNL). We discussed which options are relevant to the patient's stone(s). We discussed the natural history of kidney stones as well as the complications of untreated stones and the impact on quality of life without treatment as well as with each of the above listed treatments. We also discussed the efficacy of each treatment in its ability to clear the stone burden. With any of these management options I discussed the signs and symptoms of infection and the need for emergent treatment should these be experienced. For each option we discussed the ability of each procedure to clear the patient of their stone  burden.   For observation I described the risks which include but are not limited to silent renal damage, life-threatening infection, need for emergent surgery, failure to pass stone and pain.   For ureteroscopy I described the risks which include bleeding, infection, damage to contiguous structures, positioning injury, ureteral stricture, ureteral avulsion, ureteral injury, need for prolonged ureteral stent, inability to perform ureteroscopy, need for an interval procedure, inability to clear stone burden, stent discomfort/pain, heart attack, stroke, pulmonary embolus and the inherent risks with general anesthesia.   For shockwave lithotripsy I described the risks which include arrhythmia, kidney contusion, kidney hemorrhage, need for transfusion, pain, inability to adequately break up stone, inability to pass stone fragments, Steinstrasse,  infection associated with obstructing stones, need for alternate surgical procedure, need for repeat shockwave lithotripsy, MI, CVA, PE and the inherent risks with anesthesia/conscious sedation.   For PCNL I described the risks including positioning injury, pneumothorax, hydrothorax, need for chest tube, inability to clear stone burden, renal laceration, arterial venous fistula or malformation, need for embolization of kidney, loss of kidney or renal function, need for repeat procedure, need for prolonged nephrostomy tube, ureteral avulsion, MI, CVA, PE and the inherent risks of general anesthesia.   - The patient would like to proceed with bilateral ureteroscopic stone extraction   No follow-ups on file.  Airi Copado, MD  [facility]   

## 2021-03-04 NOTE — Patient Instructions (Signed)
Sandra Carroll  03/04/2021     @PREFPERIOPPHARMACY @   Your procedure is scheduled on  03/11/2021.   Report to 03/13/2021 at  [phone] A.M.   Call this number if you have problems the morning of surgery:  [phone]   Remember:  Do not eat or drink after midnight.      Take these medicines the morning of surgery with A SIP OF WATER      celexa, mobic (if needed), oxycodone (if needed).     Do not wear jewelry, make-up or nail polish.  Do not wear lotions, powders, or perfumes, or deodorant.  Do not shave 48 hours prior to surgery.  Men may shave face and neck.  Do not bring valuables to the hospital.  [facility] is not responsible for any belongings or valuables.  Contacts, dentures or bridgework may not be worn into surgery.  Leave your suitcase in the car.  After surgery it may be brought to your room.  For patients admitted to the hospital, discharge time will be determined by your treatment team.  Patients discharged the day of surgery will not be allowed to drive home and must have someone with them for 24 hours.    Special instructions:   DO NOT smoke tobacco or vape for 24 hours before your procedure.  Please read over the following fact sheets that you were given. Coughing and Deep Breathing, Surgical Site Infection Prevention, Anesthesia Post-op Instructions, and Care and Recovery After Surgery      Ureteral Stent Implantation, Care After This sheet gives you information about how to care for yourself after your procedure. Your health care provider may also give you more specific instructions. If you have problems or questions, contact your health care provider. What can I expect after the procedure? After the procedure, it is common to have: Nausea. Mild pain when you urinate. You may feel this pain in your lower back or lower abdomen. The pain should stop within a few minutes after you urinate. This may last for up to 1 week. A small amount of  blood in your urine for several days. Follow these instructions at home: Medicines Take over-the-counter and prescription medicines only as told by your health care provider. If you were prescribed an antibiotic medicine, take it as told by your health care provider. Do not stop taking the antibiotic even if you start to feel better. Do not drive for 24 hours if you were given a sedative during your procedure. Ask your health care provider if the medicine prescribed to you requires you to avoid driving or using heavy machinery. Activity Rest as told by your health care provider. Avoid sitting for a long time without moving. Get up to take short walks every 1-2 hours. This is important to improve blood flow and breathing. Ask for help if you feel weak or unsteady. Return to your normal activities as told by your health care provider. Ask your health care provider what activities are safe for you. General instructions  Watch for any blood in your urine. Call your health care provider if the amount of blood in your urine increases. If you have a catheter: Follow instructions from your health care provider about taking care of your catheter and collection bag. Do not take baths, swim, or use a hot tub until your health care provider approves. Ask your health care provider if you may take showers. You may only be allowed  to take sponge baths. Drink enough fluid to keep your urine pale yellow. Do not use any products that contain nicotine or tobacco, such as cigarettes, e-cigarettes, and chewing tobacco. These can delay healing after surgery. If you need help quitting, ask your health care provider. Keep all follow-up visits as told by your health care provider. This is important. Contact a health care provider if: You have pain that gets worse or does not get better with medicine, especially pain when you urinate. You have difficulty urinating. You feel nauseous or you vomit repeatedly during a  period of more than 2 days after the procedure. Get help right away if: Your urine is dark red or has blood clots in it. You are leaking urine (have incontinence). The end of the stent comes out of your urethra. You cannot urinate. You have sudden, sharp, or severe pain in your abdomen or lower back. You have a fever. You have swelling or pain in your legs. You have difficulty breathing. Summary After the procedure, it is common to have mild pain when you urinate that goes away within a few minutes after you urinate. This may last for up to 1 week. Watch for any blood in your urine. Call your health care provider if the amount of blood in your urine increases. Take over-the-counter and prescription medicines only as told by your health care provider. Drink enough fluid to keep your urine pale yellow. This information is not intended to replace advice given to you by your health care provider. Make sure you discuss any questions you have with your health care provider. Document Revised: 03/13/2018 Document Reviewed: 03/14/2018 Elsevier Patient Education  2022 [place] Anesthesia, Adult, Care After This sheet gives you information about how to care for yourself after your procedure. Your health care provider may also give you more specific instructions. If you have problems or questions, contact your health care provider. What can I expect after the procedure? After the procedure, the following side effects are common: Pain or discomfort at the IV site. Nausea. Vomiting. Sore throat. Trouble concentrating. Feeling cold or chills. Feeling weak or tired. Sleepiness and fatigue. Soreness and body aches. These side effects can affect parts of the body that were not involved in surgery. Follow these instructions at home: For the time period you were told by your health care provider:  Rest. Do not participate in activities where you could fall or become injured. Do not  drive or use machinery. Do not drink alcohol. Do not take sleeping pills or medicines that cause drowsiness. Do not make important decisions or sign legal documents. Do not take care of children on your own. Eating and drinking Follow any instructions from your health care provider about eating or drinking restrictions. When you feel hungry, start by eating small amounts of foods that are soft and easy to digest (bland), such as toast. Gradually return to your regular diet. Drink enough fluid to keep your urine pale yellow. If you vomit, rehydrate by drinking water, juice, or clear broth. General instructions If you have sleep apnea, surgery and certain medicines can increase your risk for breathing problems. Follow instructions from your health care provider about wearing your sleep device: Anytime you are sleeping, including during daytime naps. While taking prescription pain medicines, sleeping medicines, or medicines that make you drowsy. Have a responsible adult stay with you for the time you are told. It is important to have someone help care for you until you are awake  and alert. Return to your normal activities as told by your health care provider. Ask your health care provider what activities are safe for you. Take over-the-counter and prescription medicines only as told by your health care provider. If you smoke, do not smoke without supervision. Keep all follow-up visits as told by your health care provider. This is important. Contact a health care provider if: You have nausea or vomiting that does not get better with medicine. You cannot eat or drink without vomiting. You have pain that does not get better with medicine. You are unable to pass urine. You develop a skin rash. You have a fever. You have redness around your IV site that gets worse. Get help right away if: You have difficulty breathing. You have chest pain. You have blood in your urine or stool, or you vomit  blood. Summary After the procedure, it is common to have a sore throat or nausea. It is also common to feel tired. Have a responsible adult stay with you for the time you are told. It is important to have someone help care for you until you are awake and alert. When you feel hungry, start by eating small amounts of foods that are soft and easy to digest (bland), such as toast. Gradually return to your regular diet. Drink enough fluid to keep your urine pale yellow. Return to your normal activities as told by your health care provider. Ask your health care provider what activities are safe for you. This information is not intended to replace advice given to you by your health care provider. Make sure you discuss any questions you have with your health care provider. Document Revised: 02/20/2020 Document Reviewed: 09/19/2019 Elsevier Patient Education  2022 [place]. How to Use Chlorhexidine for Bathing Chlorhexidine gluconate (CHG) is a germ-killing (antiseptic) solution that is used to clean the skin. It can get rid of the bacteria that normally live on the skin and can keep them away for about 24 hours. To clean your skin with CHG, you may be given: A CHG solution to use in the shower or as part of a sponge bath. A prepackaged cloth that contains CHG. Cleaning your skin with CHG may help lower the risk for infection: While you are staying in the intensive care unit of the hospital. If you have a vascular access, such as a central line, to provide short-term or long-term access to your veins. If you have a catheter to drain urine from your bladder. If you are on a ventilator. A ventilator is a machine that helps you breathe by moving air in and out of your lungs. After surgery. What are the risks? Risks of using CHG include: A skin reaction. Hearing loss, if CHG gets in your ears and you have a perforated eardrum. Eye injury, if CHG gets in your eyes and is not rinsed out. The CHG  product catching fire. Make sure that you avoid smoking and flames after applying CHG to your skin. Do not use CHG: If you have a chlorhexidine allergy or have previously reacted to chlorhexidine. On babies younger than 100 months of age. How to use CHG solution Use CHG only as told by your health care provider, and follow the instructions on the label. Use the full amount of CHG as directed. Usually, this is one bottle. During a shower Follow these steps when using CHG solution during a shower (unless your health care provider gives you different instructions): Start the shower. Use your normal soap  and shampoo to wash your face and hair. Turn off the shower or move out of the shower stream. Pour the CHG onto a clean washcloth. Do not use any type of brush or rough-edged sponge. Starting at your neck, lather your body down to your toes. Make sure you follow these instructions: If you will be having surgery, pay special attention to the part of your body where you will be having surgery. Scrub this area for at least 1 minute. Do not use CHG on your head or face. If the solution gets into your ears or eyes, rinse them well with water. Avoid your genital area. Avoid any areas of skin that have broken skin, cuts, or scrapes. Scrub your back and under your arms. Make sure to wash skin folds. Let the lather sit on your skin for 1-2 minutes or as long as told by your health care provider. Thoroughly rinse your entire body in the shower. Make sure that all body creases and crevices are rinsed well. Dry off with a clean towel. Do not put any substances on your body afterward--such as powder, lotion, or perfume--unless you are told to do so by your health care provider. Only use lotions that are recommended by the manufacturer. Put on clean clothes or pajamas. If it is the night before your surgery, sleep in clean sheets.  During a sponge bath Follow these steps when using CHG solution during a  sponge bath (unless your health care provider gives you different instructions): Use your normal soap and shampoo to wash your face and hair. Pour the CHG onto a clean washcloth. Starting at your neck, lather your body down to your toes. Make sure you follow these instructions: If you will be having surgery, pay special attention to the part of your body where you will be having surgery. Scrub this area for at least 1 minute. Do not use CHG on your head or face. If the solution gets into your ears or eyes, rinse them well with water. Avoid your genital area. Avoid any areas of skin that have broken skin, cuts, or scrapes. Scrub your back and under your arms. Make sure to wash skin folds. Let the lather sit on your skin for 1-2 minutes or as long as told by your health care provider. Using a different clean, wet washcloth, thoroughly rinse your entire body. Make sure that all body creases and crevices are rinsed well. Dry off with a clean towel. Do not put any substances on your body afterward--such as powder, lotion, or perfume--unless you are told to do so by your health care provider. Only use lotions that are recommended by the manufacturer. Put on clean clothes or pajamas. If it is the night before your surgery, sleep in clean sheets. How to use CHG prepackaged cloths Only use CHG cloths as told by your health care provider, and follow the instructions on the label. Use the CHG cloth on clean, dry skin. Do not use the CHG cloth on your head or face unless your health care provider tells you to. When washing with the CHG cloth: Avoid your genital area. Avoid any areas of skin that have broken skin, cuts, or scrapes. Before surgery Follow these steps when using a CHG cloth to clean before surgery (unless your health care provider gives you different instructions): Using the CHG cloth, vigorously scrub the part of your body where you will be having surgery. Scrub using a back-and-forth motion  for 3 minutes. The area on your  body should be completely wet with CHG when you are done scrubbing. Do not rinse. Discard the cloth and let the area air-dry. Do not put any substances on the area afterward, such as powder, lotion, or perfume. Put on clean clothes or pajamas. If it is the night before your surgery, sleep in clean sheets.  For general bathing Follow these steps when using CHG cloths for general bathing (unless your health care provider gives you different instructions). Use a separate CHG cloth for each area of your body. Make sure you wash between any folds of skin and between your fingers and toes. Wash your body in the following order, switching to a new cloth after each step: The front of your neck, shoulders, and chest. Both of your arms, under your arms, and your hands. Your stomach and groin area, avoiding the genitals. Your right leg and foot. Your left leg and foot. The back of your neck, your back, and your buttocks. Do not rinse. Discard the cloth and let the area air-dry. Do not put any substances on your body afterward--such as powder, lotion, or perfume--unless you are told to do so by your health care provider. Only use lotions that are recommended by the manufacturer. Put on clean clothes or pajamas. Contact a health care provider if: Your skin gets irritated after scrubbing. You have questions about using your solution or cloth. You swallow any chlorhexidine. Call your local poison control center (1-667 156 5461 in the [place] Get help right away if: Your eyes itch badly, or they become very red or swollen. Your skin itches badly and is red or swollen. Your hearing changes. You have trouble seeing. You have swelling or tingling in your mouth or throat. You have trouble breathing. These symptoms may represent a serious problem that is an emergency. Do not wait to see if the symptoms will go away. Get medical help right away. Call your local emergency services  (911 in the [place] Do not drive yourself to the hospital. Summary Chlorhexidine gluconate (CHG) is a germ-killing (antiseptic) solution that is used to clean the skin. Cleaning your skin with CHG may help to lower your risk for infection. You may be given CHG to use for bathing. It may be in a bottle or in a prepackaged cloth to use on your skin. Carefully follow your health care provider's instructions and the instructions on the product label. Do not use CHG if you have a chlorhexidine allergy. Contact your health care provider if your skin gets irritated after scrubbing. This information is not intended to replace advice given to you by your health care provider. Make sure you discuss any questions you have with your health care provider. Document Revised: 08/17/2020 Document Reviewed: 08/17/2020 Elsevier Patient Education  2022 [employer].

## 2021-03-09 ENCOUNTER — Encounter (HOSPITAL_COMMUNITY)
Admission: RE | Admit: 2021-03-09 | Discharge: 2021-03-09 | Disposition: A | Discharge status: Home / Self Care | Payer: PRIVATE HEALTH INSURANCE | Source: Ambulatory Visit | Attending: Urology | Admitting: Urology

## 2021-03-10 ENCOUNTER — Other Ambulatory Visit: Payer: Self-pay

## 2021-03-10 ENCOUNTER — Encounter (HOSPITAL_COMMUNITY)
Admission: RE | Admit: 2021-03-10 | Discharge: 2021-03-10 | Disposition: A | Discharge status: Home / Self Care | Payer: PRIVATE HEALTH INSURANCE | Source: Ambulatory Visit | Attending: Urology | Admitting: Urology

## 2021-03-10 DIAGNOSIS — Z01818 Encounter for other preprocedural examination: Secondary | ICD-10-CM | POA: Diagnosis present

## 2021-03-10 LAB — CBC WITH DIFFERENTIAL/PLATELET
Abs Immature Granulocytes: 0.02 10*3/uL (ref 0.00–0.07)
Basophils Absolute: 0.1 10*3/uL (ref 0.0–0.1)
Basophils Relative: 1 %
Eosinophils Absolute: 0.1 10*3/uL (ref 0.0–0.5)
Eosinophils Relative: 1 %
HCT: 39.8 % (ref 36.0–46.0)
Hemoglobin: 12.9 g/dL (ref 12.0–15.0)
Immature Granulocytes: 0 %
Lymphocytes Relative: 25 %
Lymphs Abs: 1.9 10*3/uL (ref 0.7–4.0)
MCH: 31.9 pg (ref 26.0–34.0)
MCHC: 32.4 g/dL (ref 30.0–36.0)
MCV: 98.3 fL (ref 80.0–100.0)
Monocytes Absolute: 0.4 10*3/uL (ref 0.1–1.0)
Monocytes Relative: 5 %
Neutro Abs: 5.2 10*3/uL (ref 1.7–7.7)
Neutrophils Relative %: 68 %
Platelets: 264 10*3/uL (ref 150–400)
RBC: 4.05 MIL/uL (ref 3.87–5.11)
RDW: 13.6 % (ref 11.5–15.5)
WBC: 7.6 10*3/uL (ref 4.0–10.5)
nRBC: 0 % (ref 0.0–0.2)

## 2021-03-10 LAB — HCG, SERUM, QUALITATIVE: Preg, Serum: NEGATIVE

## 2021-03-11 ENCOUNTER — Ambulatory Visit (HOSPITAL_COMMUNITY)
Admission: RE | Admit: 2021-03-11 | Discharge: 2021-03-11 | Disposition: A | Discharge status: Home / Self Care | Payer: PRIVATE HEALTH INSURANCE | Attending: Urology | Admitting: Urology

## 2021-03-11 ENCOUNTER — Ambulatory Visit (HOSPITAL_COMMUNITY): Payer: PRIVATE HEALTH INSURANCE | Admitting: Certified Registered"

## 2021-03-11 ENCOUNTER — Ambulatory Visit (HOSPITAL_COMMUNITY): Payer: PRIVATE HEALTH INSURANCE

## 2021-03-11 ENCOUNTER — Other Ambulatory Visit: Payer: Self-pay

## 2021-03-11 ENCOUNTER — Encounter (HOSPITAL_COMMUNITY): Payer: Self-pay | Admitting: Urology

## 2021-03-11 ENCOUNTER — Encounter (HOSPITAL_COMMUNITY)
Admission: RE | Disposition: A | Discharge status: Home / Self Care | Payer: Self-pay | Source: Home / Self Care | Attending: Urology

## 2021-03-11 DIAGNOSIS — Z87891 Personal history of nicotine dependence: Secondary | ICD-10-CM | POA: Diagnosis not present

## 2021-03-11 DIAGNOSIS — N200 Calculus of kidney: Secondary | ICD-10-CM | POA: Insufficient documentation

## 2021-03-11 DIAGNOSIS — Z791 Long term (current) use of non-steroidal anti-inflammatories (NSAID): Secondary | ICD-10-CM | POA: Insufficient documentation

## 2021-03-11 DIAGNOSIS — N202 Calculus of kidney with calculus of ureter: Secondary | ICD-10-CM | POA: Diagnosis not present

## 2021-03-11 DIAGNOSIS — Z79899 Other long term (current) drug therapy: Secondary | ICD-10-CM | POA: Diagnosis not present

## 2021-03-11 DIAGNOSIS — Z87442 Personal history of urinary calculi: Secondary | ICD-10-CM | POA: Insufficient documentation

## 2021-03-11 HISTORY — PX: STONE EXTRACTION WITH BASKET: SHX5318

## 2021-03-11 HISTORY — PX: CYSTOSCOPY WITH RETROGRADE PYELOGRAM, URETEROSCOPY AND STENT PLACEMENT: SHX5789

## 2021-03-11 SURGERY — CYSTOURETEROSCOPY, WITH RETROGRADE PYELOGRAM AND STENT INSERTION
Anesthesia: General

## 2021-03-11 MED ORDER — LACTATED RINGERS IV SOLN: INTRAVENOUS | Status: DC

## 2021-03-11 MED ORDER — GLYCOPYRROLATE PF 0.2 MG/ML IJ SOSY
PREFILLED_SYRINGE | INTRAMUSCULAR | Status: DC | PRN
  Administered 2021-03-11 (×2): .1 mg via INTRAVENOUS

## 2021-03-11 MED ORDER — DIATRIZOATE MEGLUMINE 30 % UR SOLN
URETHRAL | Status: DC | PRN
  Administered 2021-03-11: 7 mL
  Administered 2021-03-11: 5 mL

## 2021-03-11 MED ORDER — FENTANYL CITRATE (PF) 100 MCG/2ML IJ SOLN
INTRAMUSCULAR | Status: DC | PRN
  Administered 2021-03-11 (×4): 50 ug via INTRAVENOUS

## 2021-03-11 MED ORDER — KETOROLAC TROMETHAMINE 30 MG/ML IJ SOLN
INTRAMUSCULAR | Status: DC | PRN
  Administered 2021-03-11: 30 mg via INTRAVENOUS

## 2021-03-11 MED ORDER — PROPOFOL 10 MG/ML IV BOLUS
INTRAVENOUS | Status: AC
  Filled 2021-03-11: qty 40

## 2021-03-11 MED ORDER — LIDOCAINE HCL (PF) 2 % IJ SOLN
INTRAMUSCULAR | Status: AC
  Filled 2021-03-11: qty 5

## 2021-03-11 MED ORDER — PHENYLEPHRINE 40 MCG/ML (10ML) SYRINGE FOR IV PUSH (FOR BLOOD PRESSURE SUPPORT)
PREFILLED_SYRINGE | INTRAVENOUS | Status: AC
  Filled 2021-03-11: qty 10

## 2021-03-11 MED ORDER — DEXAMETHASONE SODIUM PHOSPHATE 10 MG/ML IJ SOLN
INTRAMUSCULAR | Status: AC
  Filled 2021-03-11: qty 1

## 2021-03-11 MED ORDER — ORAL CARE MOUTH RINSE: 15.0000 mL | Freq: Once | OROMUCOSAL | Status: AC

## 2021-03-11 MED ORDER — KETOROLAC TROMETHAMINE 30 MG/ML IJ SOLN
INTRAMUSCULAR | Status: AC
  Filled 2021-03-11: qty 1

## 2021-03-11 MED ORDER — SODIUM CHLORIDE 0.9 % IR SOLN
Status: DC | PRN
  Administered 2021-03-11 (×2): 3000 mL

## 2021-03-11 MED ORDER — LIDOCAINE 2% (20 MG/ML) 5 ML SYRINGE
INTRAMUSCULAR | Status: DC | PRN
  Administered 2021-03-11: 100 mg via INTRAVENOUS

## 2021-03-11 MED ORDER — FENTANYL CITRATE (PF) 100 MCG/2ML IJ SOLN
INTRAMUSCULAR | Status: AC
  Filled 2021-03-11: qty 2

## 2021-03-11 MED ORDER — OXYCODONE-ACETAMINOPHEN 10-325 MG PO TABS: 1.0000 | ORAL_TABLET | ORAL | 0 refills | Status: DC | PRN

## 2021-03-11 MED ORDER — ONDANSETRON HCL 4 MG/2ML IJ SOLN: 4.0000 mg | Freq: Once | INTRAMUSCULAR | Status: DC | PRN

## 2021-03-11 MED ORDER — MIDAZOLAM HCL 5 MG/5ML IJ SOLN
INTRAMUSCULAR | Status: DC | PRN
  Administered 2021-03-11: 2 mg via INTRAVENOUS

## 2021-03-11 MED ORDER — PHENYLEPHRINE 40 MCG/ML (10ML) SYRINGE FOR IV PUSH (FOR BLOOD PRESSURE SUPPORT)
PREFILLED_SYRINGE | INTRAVENOUS | Status: DC | PRN
  Administered 2021-03-11 (×2): 80 ug via INTRAVENOUS

## 2021-03-11 MED ORDER — EPHEDRINE SULFATE-NACL 50-0.9 MG/10ML-% IV SOSY
PREFILLED_SYRINGE | INTRAVENOUS | Status: DC | PRN
  Administered 2021-03-11 (×2): 5 mg via INTRAVENOUS

## 2021-03-11 MED ORDER — EPHEDRINE 5 MG/ML INJ
INTRAVENOUS | Status: AC
  Filled 2021-03-11: qty 5

## 2021-03-11 MED ORDER — ONDANSETRON HCL 4 MG/2ML IJ SOLN
INTRAMUSCULAR | Status: AC
  Filled 2021-03-11: qty 2

## 2021-03-11 MED ORDER — CEFAZOLIN SODIUM-DEXTROSE 2-4 GM/100ML-% IV SOLN
2.0000 g | INTRAVENOUS | Status: AC
  Administered 2021-03-11: 2 g via INTRAVENOUS

## 2021-03-11 MED ORDER — PROPOFOL 10 MG/ML IV BOLUS
INTRAVENOUS | Status: DC | PRN
Start: 2021-03-11 — End: 2021-03-11
  Administered 2021-03-11: 200 mg via INTRAVENOUS

## 2021-03-11 MED ORDER — DIATRIZOATE MEGLUMINE 30 % UR SOLN
URETHRAL | Status: AC
  Filled 2021-03-11: qty 100

## 2021-03-11 MED ORDER — CHLORHEXIDINE GLUCONATE 0.12 % MT SOLN
15.0000 mL | Freq: Once | OROMUCOSAL | Status: AC
  Administered 2021-03-11: 15 mL via OROMUCOSAL

## 2021-03-11 MED ORDER — BELLADONNA ALKALOIDS-OPIUM 16.2-60 MG RE SUPP
1.0000 | Freq: Once | RECTAL | Status: DC
  Filled 2021-03-11: qty 1

## 2021-03-11 MED ORDER — ONDANSETRON HCL 4 MG PO TABS: 4.0000 mg | ORAL_TABLET | Freq: Every day | ORAL | 1 refills | Status: DC | PRN

## 2021-03-11 MED ORDER — GLYCOPYRROLATE PF 0.2 MG/ML IJ SOSY
PREFILLED_SYRINGE | INTRAMUSCULAR | Status: AC
  Filled 2021-03-11: qty 1

## 2021-03-11 MED ORDER — ONDANSETRON HCL 4 MG/2ML IJ SOLN
INTRAMUSCULAR | Status: DC | PRN
  Administered 2021-03-11: 4 mg via INTRAVENOUS

## 2021-03-11 MED ORDER — CEFAZOLIN SODIUM-DEXTROSE 2-4 GM/100ML-% IV SOLN
INTRAVENOUS | Status: AC
  Filled 2021-03-11: qty 100

## 2021-03-11 MED ORDER — MIDAZOLAM HCL 2 MG/2ML IJ SOLN
INTRAMUSCULAR | Status: AC
  Filled 2021-03-11: qty 2

## 2021-03-11 MED ORDER — WATER FOR IRRIGATION, STERILE IR SOLN
Status: DC | PRN
  Administered 2021-03-11: 500 mL

## 2021-03-11 MED ORDER — FENTANYL CITRATE PF 50 MCG/ML IJ SOSY
25.0000 ug | PREFILLED_SYRINGE | INTRAMUSCULAR | Status: DC | PRN
  Administered 2021-03-11 (×2): 50 ug via INTRAVENOUS
  Filled 2021-03-11 (×2): qty 1

## 2021-03-11 MED ORDER — DEXAMETHASONE SODIUM PHOSPHATE 4 MG/ML IJ SOLN
INTRAMUSCULAR | Status: DC | PRN
  Administered 2021-03-11: 4 mg via INTRAVENOUS

## 2021-03-11 SURGICAL SUPPLY — 24 items
BAG DRAIN URO TABLE W/ADPT NS (BAG) ×3 IMPLANT
BAG DRN 8 ADPR NS SKTRN CSTL (BAG) ×2
BAG HAMPER (MISCELLANEOUS) ×3 IMPLANT
CATH INTERMIT  6FR 70CM (CATHETERS) ×3 IMPLANT
CLOTH BEACON ORANGE TIMEOUT ST (SAFETY) ×3 IMPLANT
DECANTER SPIKE VIAL GLASS SM (MISCELLANEOUS) ×3 IMPLANT
EXTRACTOR STONE NITINOL NGAGE (UROLOGICAL SUPPLIES) ×2 IMPLANT
GLOVE SURG POLYISO LF SZ8 (GLOVE) ×3 IMPLANT
GLOVE SURG UNDER POLY LF SZ7 (GLOVE) ×6 IMPLANT
GOWN STRL REUS W/TWL LRG LVL3 (GOWN DISPOSABLE) ×3 IMPLANT
GOWN STRL REUS W/TWL XL LVL3 (GOWN DISPOSABLE) ×3 IMPLANT
GUIDEWIRE STR DUAL SENSOR (WIRE) ×3 IMPLANT
GUIDEWIRE STR ZIPWIRE 035X150 (MISCELLANEOUS) ×3 IMPLANT
IV NS IRRIG 3000ML ARTHROMATIC (IV SOLUTION) ×6 IMPLANT
KIT TURNOVER CYSTO (KITS) ×3 IMPLANT
MANIFOLD NEPTUNE II (INSTRUMENTS) ×3 IMPLANT
PACK CYSTO (CUSTOM PROCEDURE TRAY) ×3 IMPLANT
PAD ARMBOARD 7.5X6 YLW CONV (MISCELLANEOUS) ×3 IMPLANT
SHEATH URETERAL 12FRX35CM (MISCELLANEOUS) ×2 IMPLANT
STENT URET 6FRX26 CONTOUR (STENTS) ×2 IMPLANT
SYR 10ML LL (SYRINGE) ×3 IMPLANT
SYR CONTROL 10ML LL (SYRINGE) ×3 IMPLANT
TOWEL OR 17X26 4PK STRL BLUE (TOWEL DISPOSABLE) ×3 IMPLANT
WATER STERILE IRR 500ML POUR (IV SOLUTION) ×3 IMPLANT

## 2021-03-11 NOTE — Op Note (Signed)
Marland KitchenPreoperative diagnosis: bilateral renal calculi  Postoperative diagnosis: Same  Procedure: 1 cystoscopy 2. Bilateral retrograde pyelography 3.  Intraoperative fluoroscopy, under one hour, with interpretation 4.  Bilateral ureteroscopic stone manipulation with basket extraction 5.  right 6 x 26 JJ stent placement  Attending: Cleda Mccreedy  Anesthesia: General  Estimated blood loss: None  Drains: right 6 x 26 JJ ureteral stent with tether  Specimens: stone for analysis  Antibiotics: ancef  Findings: bilateral mid and upper pole renal calculi. Right proximal ureteral calculus.No left  hydronephrosis, moderate right hydronephrosis. No masses/lesions in the bladder. Ureteral orifices in normal anatomic location.  Indications: Patient is a 55 year old female with a history of bilateral renal calculi and bilateral flank pain. After discussing treatment options, they decided proceed with bilateral ureteroscopic stone manipulation.  Procedure in detail: The patient was brought to the operating room and a brief timeout was done to ensure correct patient, correct procedure, correct site.  General anesthesia was administered patient was placed in dorsal lithotomy position.  Her genitalia was then prepped and draped in usual sterile fashion.  A rigid 22 French cystoscope was passed in the urethra and the bladder.  Bladder was inspected free masses or lesions.  the ureteral orifices were in the normal orthotopic locations. a 6 french ureteral catheter was then instilled into the left ureteral orifice.  a gentle retrograde was obtained and findings noted above. We then advanced a zipwire through the catheter and up to the renal pelvis.  we then removed the cystoscope and cannulated the left ureteral orifice with a semirigid ureteroscope.  We located no stone in the ureter. We then placed a sensor wire up to the renal pelvis. We removed the scope and advanced a 12/14 x 35cm access sheath up to the  renal pelvis. We then used the flexible ureteroscope to perform nephroscopy. We located calculi in the mid and upper poles which were removed with an NGage basket. Once the stone were removed we then removed the access sheath under direct vision and noted to injury to the ureter.  We elected not to leave a stent on the left. We then turned out attention to the right side. a 6 french ureteral catheter was then instilled into the right ureteral orifice.  a gentle retrograde was obtained and findings noted above. We then advanced a zipwire through the catheter and up to the renal pelvis.  we then removed the cystoscope and cannulated the right ureteral orifice with a semirigid ureteroscope.  We located a stone in the proximal ureter which was removed with an NGage basket. We then performed repeat ureteroscopy to the UPJ. We located no stone in the ureter. We then placed a sensor wire up to the renal pelvis. We removed the scope and advanced a 12/14 x 35cm access sheath up to the renal pelvis. We then used the flexible ureteroscope to perform nephroscopy. We located calculi in the mid and upper poles which were removed with an NGage basket. Once the stone were removed we then removed the access sheath under direct vision and noted to injury to the ureter. we then placed a 6 x 26 double-j ureteral stent over the original zip wire.  We then removed the wire and good coil was noted in the the renal pelvis under fluoroscopy and the bladder under direct vision.   the bladder was then drained and this concluded the procedure which was well tolerated by patient.  Complications: None  Condition: Stable, extubated, transferred to PACU  Plan:  Patient is to be discharged home as to follow-up in 2 weeks. She is to remove her stent by pulling the tether in 72 hours

## 2021-03-11 NOTE — Anesthesia Procedure Notes (Signed)
Procedure Name: LMA Insertion Date/Time: 03/11/2021 8:16 AM Performed by: Julian Reil, CRNA Pre-anesthesia Checklist: Patient identified, Emergency Drugs available, Suction available and Patient being monitored Patient Re-evaluated:Patient Re-evaluated prior to induction Oxygen Delivery Method: Circle system utilized Preoxygenation: Pre-oxygenation with 100% oxygen Induction Type: IV induction LMA: LMA inserted LMA Size: 4.0 Tube type: Oral Number of attempts: 1 Placement Confirmation: positive ETCO2 Tube secured with: Tape Dental Injury: Teeth and Oropharynx as per pre-operative assessment

## 2021-03-11 NOTE — Interval H&P Note (Signed)
History and Physical Interval Note:  03/11/2021 7:42 AM  Sandra Carroll  has presented today for surgery, with the diagnosis of bilateral renal calculi.  The various methods of treatment have been discussed with the patient and family. After consideration of risks, benefits and other options for treatment, the patient has consented to  Procedure(s): CYSTOSCOPY WITH BILATERAL RETROGRADE PYELOGRAM, BILATERAL URETEROSCOPY AND BILATERAL STENT PLACEMENT (Bilateral) HOLMIUM LASER APPLICATION (Bilateral) as a surgical intervention.  The patient's history has been reviewed, patient examined, no change in status, stable for surgery.  I have reviewed the patient's chart and labs.  Questions were answered to the patient's satisfaction.     Sandra Carroll

## 2021-03-11 NOTE — Anesthesia Postprocedure Evaluation (Signed)
Anesthesia Post Note  Patient: Sandra Carroll  Procedure(s) Performed: CYSTOSCOPY WITH BILATERAL RETROGRADE PYELOGRAM, BILATERAL URETEROSCOPY AND RIGHT URETERAL STENT PLACEMENT STONE EXTRACTION WITH BASKET BILATERAL  Patient location during evaluation: Phase II Anesthesia Type: General Level of consciousness: awake Pain management: pain level controlled Vital Signs Assessment: post-procedure vital signs reviewed and stable Respiratory status: spontaneous breathing and respiratory function stable Cardiovascular status: blood pressure returned to baseline and stable Postop Assessment: no headache and no apparent nausea or vomiting Anesthetic complications: no Comments: Late entry   No notable events documented.   Last Vitals:  Vitals:   03/11/21 1030 03/11/21 1051  BP: 99/60 112/68  Pulse: 62   Resp: 12 16  Temp: 36.9 C 36.9 C  SpO2: 98% 96%    Last Pain:  Vitals:   03/11/21 1051  TempSrc: Oral  PainSc: 6                  Windell Norfolk

## 2021-03-11 NOTE — Transfer of Care (Signed)
Immediate Anesthesia Transfer of Care Note  Patient: Sandra Carroll  Procedure(s) Performed: CYSTOSCOPY WITH BILATERAL RETROGRADE PYELOGRAM, BILATERAL URETEROSCOPY AND RIGHT URETERAL STENT PLACEMENT HOLMIUM LASER APPLICATION (Bilateral) STONE EXTRACTION WITH BASKET BILATERAL  Patient Location: PACU  Anesthesia Type:General  Level of Consciousness: drowsy  Airway & Oxygen Therapy: Patient Spontanous Breathing and Patient connected to face mask oxygen  Post-op Assessment: Report given to RN and Post -op Vital signs reviewed and stable  Post vital signs: Reviewed and stable  Last Vitals:  Vitals Value Taken Time  BP 91/63 03/11/21 0912  Temp    Pulse 78 03/11/21 0913  Resp 14 03/11/21 0913  SpO2 100 % 03/11/21 0913  Vitals shown include unvalidated device data.  Last Pain:  Vitals:   03/11/21 0705  TempSrc: Oral  PainSc: 10-Worst pain ever      Patients Stated Pain Goal: 9 (03/11/21 0705)  Complications: No notable events documented.

## 2021-03-11 NOTE — Anesthesia Preprocedure Evaluation (Signed)
Anesthesia Evaluation  Patient identified by MRN, date of birth, ID band Patient awake    Reviewed: Allergy & Precautions, H&P , NPO status , Patient's Chart, lab work & pertinent test results, reviewed documented beta blocker date and time   History of Anesthesia Complications (+) PONV  Airway Mallampati: II  TM Distance: >3 FB Neck ROM: full    Dental no notable dental hx.    Pulmonary neg pulmonary ROS, former smoker,    Pulmonary exam normal breath sounds clear to auscultation       Cardiovascular Exercise Tolerance: Good hypertension, negative cardio ROS   Rhythm:regular Rate:Normal     Neuro/Psych  Headaches, PSYCHIATRIC DISORDERS Anxiety Depression  Neuromuscular disease    GI/Hepatic negative GI ROS, Neg liver ROS,   Endo/Other  negative endocrine ROS  Renal/GU Renal disease  negative genitourinary   Musculoskeletal   Abdominal   Peds  Hematology negative hematology ROS (+) Blood dyscrasia, anemia ,   Anesthesia Other Findings   Reproductive/Obstetrics negative OB ROS                             Anesthesia Physical Anesthesia Plan  ASA: 3  Anesthesia Plan: General   Post-op Pain Management:    Induction:   PONV Risk Score and Plan: Ondansetron  Airway Management Planned:   Additional Equipment:   Intra-op Plan:   Post-operative Plan:   Informed Consent: I have reviewed the patients History and Physical, chart, labs and discussed the procedure including the risks, benefits and alternatives for the proposed anesthesia with the patient or authorized representative who has indicated his/her understanding and acceptance.     Dental Advisory Given  Plan Discussed with: CRNA  Anesthesia Plan Comments:         Anesthesia Quick Evaluation

## 2021-03-12 ENCOUNTER — Encounter (HOSPITAL_COMMUNITY): Payer: Self-pay | Admitting: Urology

## 2021-03-17 ENCOUNTER — Encounter: Payer: Self-pay | Admitting: Urology

## 2021-03-17 ENCOUNTER — Ambulatory Visit (INDEPENDENT_AMBULATORY_CARE_PROVIDER_SITE_OTHER): Payer: PRIVATE HEALTH INSURANCE | Admitting: Urology

## 2021-03-17 ENCOUNTER — Encounter: Payer: PRIVATE HEALTH INSURANCE | Admitting: Family Medicine

## 2021-03-17 ENCOUNTER — Other Ambulatory Visit: Payer: Self-pay

## 2021-03-17 VITALS — BP 122/84 | HR 84 | Temp 97.9°F | Wt 206.2 lb

## 2021-03-17 DIAGNOSIS — N200 Calculus of kidney: Secondary | ICD-10-CM | POA: Diagnosis not present

## 2021-03-17 LAB — CALCULI, WITH PHOTOGRAPH (CLINICAL LAB)
Calcium Oxalate Dihydrate: 100 %
Weight Calculi: 53 mg

## 2021-03-17 LAB — MICROSCOPIC EXAMINATION
Epithelial Cells (non renal): NONE SEEN /hpf (ref 0–10)
Renal Epithel, UA: NONE SEEN /hpf
WBC, UA: NONE SEEN /hpf (ref 0–5)

## 2021-03-17 LAB — URINALYSIS, ROUTINE W REFLEX MICROSCOPIC
Bilirubin, UA: NEGATIVE
Glucose, UA: NEGATIVE
Ketones, UA: NEGATIVE
Leukocytes,UA: NEGATIVE
Nitrite, UA: NEGATIVE
Protein,UA: NEGATIVE
Specific Gravity, UA: 1.02 (ref 1.005–1.030)
Urobilinogen, Ur: 0.2 mg/dL (ref 0.2–1.0)
pH, UA: 6 (ref 5.0–7.5)

## 2021-03-17 MED ORDER — INDAPAMIDE 2.5 MG PO TABS: 2.5000 mg | ORAL_TABLET | Freq: Every day | ORAL | 11 refills | Status: DC

## 2021-03-17 NOTE — Patient Instructions (Signed)
Dietary Guidelines to Help Prevent Kidney Stones Kidney stones are deposits of minerals and salts that form inside your kidneys. Your risk of developing kidney stones may be greater depending on your diet, your lifestyle, the medicines you take, and whether you have certain medical conditions. Most people can lower their chances of developing kidney stones by following the instructions below. Your dietitian may give you more specific instructions depending on your overall health and the type of kidney stones you tend to develop. What are tips for following this plan? Reading food labels  Choose foods with "no salt added" or "low-salt" labels. Limit your salt (sodium) intake to less than 1,500 mg a day. Choose foods with calcium for each meal and snack. Try to eat about 300 mg of calcium at each meal. Foods that contain 200-500 mg of calcium a serving include: 8 oz (237 mL) of milk, calcium-fortifiednon-dairy milk, and calcium-fortifiedfruit juice. Calcium-fortified means that calcium has been added to these drinks. 8 oz (237 mL) of kefir, yogurt, and soy yogurt. 4 oz (114 g) of tofu. 1 oz (28 g) of cheese. 1 cup (150 g) of dried figs. 1 cup (91 g) of cooked broccoli. One 3 oz (85 g) can of sardines or mackerel. Most people need 1,000-1,500 mg of calcium a day. Talk to your dietitian about how much calcium is recommended for you. Shopping Buy plenty of fresh fruits and vegetables. Most people do not need to avoid fruits and vegetables, even if these foods contain nutrients that may contribute to kidney stones. When shopping for convenience foods, choose: Whole pieces of fruit. Pre-made salads with dressing on the side. Low-fat fruit and yogurt smoothies. Avoid buying frozen meals or prepared deli foods. These can be high in sodium. Look for foods with live cultures, such as yogurt and kefir. Choose high-fiber grains, such as whole-wheat breads, oat bran, and wheat cereals. Cooking Do not add  salt to food when cooking. Place a salt shaker on the table and allow each person to add his or her own salt to taste. Use vegetable protein, such as beans, textured vegetable protein (TVP), or tofu, instead of meat in pasta, casseroles, and soups. Meal planning Eat less salt, if told by your dietitian. To do this: Avoid eating processed or pre-made food. Avoid eating fast food. Eat less animal protein, including cheese, meat, poultry, or fish, if told by your dietitian. To do this: Limit the number of times you have meat, poultry, fish, or cheese each week. Eat a diet free of meat at least 2 days a week. Eat only one serving each day of meat, poultry, fish, or seafood. When you prepare animal protein, cut pieces into small portion sizes. For most meat and fish, one serving is about the size of the palm of your hand. Eat at least five servings of fresh fruits and vegetables each day. To do this: Keep fruits and vegetables on hand for snacks. Eat one piece of fruit or a handful of berries with breakfast. Have a salad and fruit at lunch. Have two kinds of vegetables at dinner. Limit foods that are high in a substance called oxalate. These include: Spinach (cooked), rhubarb, beets, sweet potatoes, and Swiss chard. Peanuts. Potato chips, french fries, and baked potatoes with skin on. Nuts and nut products. Chocolate. If you regularly take a diuretic medicine, make sure to eat at least 1 or 2 servings of fruits or vegetables that are high in potassium each day. These include: Avocado. Banana. Orange, prune,   carrot, or tomato juice. Baked potato. Cabbage. Beans and split peas. Lifestyle  Drink enough fluid to keep your urine pale yellow. This is the most important thing you can do. Spread your fluid intake throughout the day. If you drink alcohol: Limit how much you use to: 0-1 drink a day for women who are not pregnant. 0-2 drinks a day for men. Be aware of how much alcohol is in your  drink. In the [place] one drink equals one 12 oz bottle of beer (355 mL), one 5 oz glass of wine (148 mL), or one 1 oz glass of hard liquor (44 mL). Lose weight if told by your health care provider. Work with your dietitian to find an eating plan and weight loss strategies that work best for you. General information Talk to your health care provider and dietitian about taking daily supplements. You may be told the following depending on your health and the cause of your kidney stones: Not to take supplements with vitamin C. To take a calcium supplement. To take a daily probiotic supplement. To take other supplements such as magnesium, fish oil, or vitamin B6. Take over-the-counter and prescription medicines only as told by your health care provider. These include supplements. What foods should I limit? Limit your intake of the following foods, or eat them as told by your dietitian. Vegetables Spinach. Rhubarb. Beets. Canned vegetables. Pickles. Olives. Baked potatoes with skin. Grains Wheat bran. Baked goods. Salted crackers. Cereals high in sugar. Meats and other proteins Nuts. Nut butters. Large portions of meat, poultry, or fish. Salted, precooked, or cured meats, such as sausages, meat loaves, and hot dogs. Dairy Cheese. Beverages Regular soft drinks. Regular vegetable juice. Seasonings and condiments Seasoning blends with salt. Salad dressings. Soy sauce. Ketchup. Barbecue sauce. Other foods Canned soups. Canned pasta sauce. Casseroles. Pizza. Lasagna. Frozen meals. Potato chips. French fries. The items listed above may not be a complete list of foods and beverages you should limit. Contact a dietitian for more information. What foods should I avoid? Talk to your dietitian about specific foods you should avoid based on the type of kidney stones you have and your overall health. Fruits Grapefruit. The item listed above may not be a complete list of foods and beverages you should  avoid. Contact a dietitian for more information. Summary Kidney stones are deposits of minerals and salts that form inside your kidneys. You can lower your risk of kidney stones by making changes to your diet. The most important thing you can do is drink enough fluid. Drink enough fluid to keep your urine pale yellow. Talk to your dietitian about how much calcium you should have each day, and eat less salt and animal protein as told by your dietitian. This information is not intended to replace advice given to you by your health care provider. Make sure you discuss any questions you have with your health care provider. Document Revised: 05/30/2019 Document Reviewed: 05/30/2019 Elsevier Patient Education  2022 Elsevier Inc.  

## 2021-03-17 NOTE — Progress Notes (Signed)
03/17/2021 9:17 AM   Sandra Carroll [birth year] [id number]  Referring provider: Gwenlyn Fudge, FNP [address] [place],  [place] [address]  Followup nephrolithiasis   HPI: [name] is a 55yo here for followup for nephrolithiasis. She underwent bilaterla ureteroscopy last week and removed her tethered stent POD#3. No flank pain. No worsening LUTS. She has known hypercalcuria.    PMH: Past Medical History:  Diagnosis Date   Acute pyelonephritis 05/07/2020   Anxiety    Chronic back pain    Complication of anesthesia 1991 and 1998   childbirth --reaction to anesthesia was itching.    DDD (degenerative disc disease)    Depression    Headache    migraines   Hemorrhoids    History of kidney stones    Hyperlipidemia    Hypertension    Iron deficiency anemia 07/20/2020   Kidney stone    PONV (postoperative nausea and vomiting)    Vitamin B12 deficiency 07/20/2020    Surgical History: Past Surgical History:  Procedure Laterality Date   ADENOIDECTOMY  1978   CESAREAN SECTION  1991 1998   pt had itching with 1991 c-section.    CHOLECYSTECTOMY     COLONOSCOPY N/A 03/01/2013   Dr. Jena Gauss- normal rectum except for anal canal/internal hemorrhoidal tags. colonic mucosa appeared normal.   CYSTOSCOPY W/ URETERAL STENT PLACEMENT Left 12/09/2019   Procedure: CYSTOSCOPY WITH RETROGRADE PYELOGRAM/URETERAL STENT PLACEMENT;  Surgeon: Bjorn Pippin, MD;  Location: [facility];  Service: Urology;  Laterality: Left;   CYSTOSCOPY W/ URETERAL STENT PLACEMENT Right 05/07/2020   Procedure: CYSTOSCOPY WITH RETROGRADE PYELOGRAM/URETERAL STENT PLACEMENT;  Surgeon: Jerilee Field, MD;  Location: [facility];  Service: Urology;  Laterality: Right;   CYSTOSCOPY WITH RETROGRADE PYELOGRAM, URETEROSCOPY AND STENT PLACEMENT Right 06/02/2020   Procedure: CYSTOSCOPY WITH RETROGRADE PYELOGRAM, URETEROSCOPY, BASKET EXTRACTION OF STONES AND STENT EXCHANGE;  Surgeon: Malen Gauze, MD;  Location: [facility];   Service: Urology;  Laterality: Right;   CYSTOSCOPY WITH RETROGRADE PYELOGRAM, URETEROSCOPY AND STENT PLACEMENT  03/11/2021   Procedure: CYSTOSCOPY WITH BILATERAL RETROGRADE PYELOGRAM, BILATERAL URETEROSCOPY AND RIGHT URETERAL STENT PLACEMENT;  Surgeon: Malen Gauze, MD;  Location: [facility];  Service: Urology;;   CYSTOSCOPY/URETEROSCOPY/HOLMIUM LASER/STENT PLACEMENT Left 12/17/2019   Procedure: LEFT URETEROSCOPY/STENT EXCHANGE/ EXTRACTION OF LEFT URETERAL CALCULI;  Surgeon: Bjorn Pippin, MD;  Location: [facility];  Service: Urology;  Laterality: Left;   KNEE ARTHROSCOPY Right 2003   LITHOTRIPSY     LUMBAR LAMINECTOMY/DECOMPRESSION MICRODISCECTOMY  03/02/2012   Procedure: LUMBAR LAMINECTOMY/DECOMPRESSION MICRODISCECTOMY 1 LEVEL;  Surgeon: Carmela Hurt, MD;  Location: [facility];  Service: Neurosurgery;  Laterality: Left;  LEFT Lumbar Five-Sacral One Laminectomy for Decompression    STONE EXTRACTION WITH BASKET  03/11/2021   Procedure: STONE EXTRACTION WITH BASKET BILATERAL;  Surgeon: Malen Gauze, MD;  Location: [facility];  Service: Urology;;    Home Medications:  Allergies as of 03/17/2021   No Known Allergies      Medication List        Accurate as of March 17, 2021  9:17 AM. If you have any questions, ask your nurse or doctor.          citalopram 20 MG tablet Commonly known as: CeleXA Take 1 tablet (20 mg total) by mouth daily.   lisinopril 20 MG tablet Commonly known as: ZESTRIL Take 1 tablet (20 mg total) by mouth daily.   meloxicam 7.5 MG tablet Commonly known as: MOBIC Take 1 tablet (7.5 mg total)  by mouth daily.   ondansetron 4 MG tablet Commonly known as: Zofran Take 1 tablet (4 mg total) by mouth daily as needed for nausea or vomiting.   oxyCODONE-acetaminophen 10-325 MG tablet Commonly known as: Percocet Take 1 tablet by mouth every 4 (four) hours as needed for pain.   simvastatin 20 MG tablet Commonly known as: ZOCOR Take 1 tablet (20 mg total) by  mouth daily at 6 PM.   vitamin B-12 1000 MCG tablet Commonly known as: CYANOCOBALAMIN Take 1,000 mcg by mouth daily.        Allergies: No Known Allergies  Family History: Family History  Problem Relation Age of Onset   Hypertension Mother    Cancer Father    Hypertension Father    Colon cancer Neg Hx     Social History:  reports that she quit smoking about 12 years ago. Her smoking use included cigarettes. She has a 7.50 pack-year smoking history. She has never used smokeless tobacco. She reports current alcohol use. She reports that she does not use drugs.  ROS: All other review of systems were reviewed and are negative except what is noted above in HPI  Physical Exam: BP 122/84   Pulse 84   Temp 97.9 F (36.6 C)   Wt 206 lb 3.2 oz (93.5 kg)   LMP 10/17/2019 (Approximate)   BMI 37.71 kg/m   Constitutional:  Alert and oriented, No acute distress. HEENT: [place] AT, moist mucus membranes.  Trachea midline, no masses. Cardiovascular: No clubbing, cyanosis, or edema. Respiratory: Normal respiratory effort, no increased work of breathing. GI: Abdomen is soft, nontender, nondistended, no abdominal masses GU: No CVA tenderness.  Lymph: No cervical or inguinal lymphadenopathy. Skin: No rashes, bruises or suspicious lesions. Neurologic: Grossly intact, no focal deficits, moving all 4 extremities. Psychiatric: Normal mood and affect.  Laboratory Data: Lab Results  Component Value Date   WBC 7.6 03/10/2021   HGB 12.9 03/10/2021   HCT 39.8 03/10/2021   MCV 98.3 03/10/2021   PLT 264 03/10/2021    Lab Results  Component Value Date   CREATININE 1.06 (H) 10/28/2020    No results found for: PSA  No results found for: TESTOSTERONE  No results found for: HGBA1C  Urinalysis    Component Value Date/Time   COLORURINE YELLOW 05/07/2020 1233   APPEARANCEUR Clear 07/08/2020 1408   LABSPEC 1.013 05/07/2020 1233   PHURINE 6.0 05/07/2020 1233   GLUCOSEU Negative 07/08/2020  1408   HGBUR SMALL (A) 05/07/2020 1233   BILIRUBINUR Negative 07/08/2020 1408   KETONESUR NEGATIVE 05/07/2020 1233   PROTEINUR Negative 07/08/2020 1408   PROTEINUR 100 (A) 05/07/2020 1233   UROBILINOGEN 0.2 07/25/2014 0215   NITRITE Negative 07/08/2020 1408   NITRITE NEGATIVE 05/07/2020 1233   LEUKOCYTESUR Negative 07/08/2020 1408   LEUKOCYTESUR LARGE (A) 05/07/2020 1233    Lab Results  Component Value Date   LABMICR Comment 07/08/2020   WBCUA 6-10 (A) 06/10/2020   LABEPIT 0-10 06/10/2020   MUCUS Present 06/10/2020   BACTERIA Moderate (A) 06/10/2020    Pertinent Imaging:  Results for orders placed during the hospital encounter of 12/31/20  Abdomen 1 view (KUB)  Narrative CLINICAL DATA:  Nephrolithiasis.  EXAM: ABDOMEN - 1 VIEW  COMPARISON:  None.  FINDINGS: Multiple calcifications in the pelvis. Both kidneys are largely obscured by bowel contents. No definite renal stones are seen on this study. No ureteral stones identified. No other abnormalities.  IMPRESSION: The kidneys are largely obscured by bowel contents but no  stones are seen.  Calcifications in the pelvis are nonspecific but may all be phleboliths. No other evidence of ureteral stone.   Electronically Signed By: Gerome Sam III M.D On: 01/03/2021 19:43  No results found for this or any previous visit.  No results found for this or any previous visit.  No results found for this or any previous visit.  Results for orders placed during the hospital encounter of 12/31/20  Ultrasound renal complete  Narrative CLINICAL DATA:  Nephrolithiasis.  Follow-up kidney stones.  EXAM: RENAL / URINARY TRACT ULTRASOUND COMPLETE  COMPARISON:  September 30, 2020  FINDINGS: Right Kidney:  Renal measurements: 11.2 x 5.4 x 6.5 cm = volume: 205.2 mL. There is a 7.3 mm stone in the lower right kidney. The second stone seen previously is no longer visualized.  Left Kidney:  Renal measurements: 10.0 x  4.8 x 4.7 cm = volume: 115.8 mL. Mild fullness of the left renal pelvis without gross caliectasis. There is a 5.9 mm stone in the mid left kidney.  Bladder:  Poorly distended limiting evaluation.  No abnormalities noted.  Other:  None.  IMPRESSION: 1. Bilateral renal stones. No obstruction on the right. Mild fullness of the left renal pelvis on the left without gross hydronephrosis. Recommend clinical correlation.   Electronically Signed By: Gerome Sam III M.D On: 01/03/2021 19:46  No results found for this or any previous visit.  No results found for this or any previous visit.  Results for orders placed during the hospital encounter of 05/07/20  CT Renal Stone Study  Narrative CLINICAL DATA:  Right-sided lower back pain  EXAM: CT ABDOMEN AND PELVIS WITHOUT CONTRAST  TECHNIQUE: Multidetector CT imaging of the abdomen and pelvis was performed following the standard protocol without IV contrast.  COMPARISON:  None.  FINDINGS: Lower chest: The visualized heart size within normal limits. No pericardial fluid/thickening.  No hiatal hernia.  The visualized portions of the lungs are clear.  Hepatobiliary: Although limited due to the lack of intravenous contrast, normal in appearance without gross focal abnormality. The patient is status post cholecystectomy. No biliary ductal dilation.  Pancreas:  Unremarkable.  No surrounding inflammatory changes.  Spleen: Normal in size. Although limited due to the lack of intravenous contrast, normal in appearance.  Adrenals/Urinary Tract: Both adrenal glands appear normal. Moderate right pelvicaliectasis and ureterectasis is seen down the level of the distal ureter where there is several ureteral calculi 1 measuring 3 mm and 1 measuring 4 mm. There is right-sided perinephric and proximal periureteral stranding changes. There is a tiny punctate calcification in the posterior right bladder. There is also a a 3 mm  calculus seen in the mid pole the right kidney. No left-sided renal or collecting system calculi are noted.  Stomach/Bowel: The stomach, small bowel, and colon are normal in appearance. No inflammatory changes or obstructive findings. Scattered colonic diverticula are noted.  Vascular/Lymphatic: There are no enlarged abdominal or pelvic lymph nodes. Scattered aortic atherosclerotic calcifications are seen without aneurysmal dilatation.  Reproductive: The uterus and adnexa are unremarkable.  Other: Bilateral fat containing inguinal hernias are noted.  Musculoskeletal: No acute or significant osseous findings.  IMPRESSION: Moderate right hydronephrosis to the distal ureter where there is several calcified ureteral calculi measuring 3 mm and 4 mm. There is also punctate calcification in the posterior right bladder.  Right-sided perinephric and periureteral stranding which could be from the recently passed stone versus pyelonephritis.  Nonobstructing right renal calculus.   Electronically Signed By: Kandis Fantasia  Avutu M.D. On: 05/07/2020 16:09   Assessment & Plan:    1. Nephrolithiasis -RTC 8 weeks with renal US and 24 hour urine  - Urinalysis, Routine w reflex microscopic   No follow-ups on file.  [name], MD  [facility] [place]

## 2021-03-17 NOTE — Progress Notes (Signed)
Urological Symptom Review ° °Patient is experiencing the following symptoms: °Get up at night to urinate °Injury to kidneys/bladder ° ° °Review of Systems ° °Gastrointestinal (upper)  : °Negative for upper GI symptoms ° °Gastrointestinal (lower) : °Negative for lower GI symptoms ° °Constitutional : °Negative for symptoms ° °Skin: °Negative for skin symptoms ° °Eyes: °Negative for eye symptoms ° °Ear/Nose/Throat : °Negative for Ear/Nose/Throat symptoms ° °Hematologic/Lymphatic: °Negative for Hematologic/Lymphatic symptoms ° °Cardiovascular : °Negative for cardiovascular symptoms ° °Respiratory : °Negative for respiratory symptoms ° °Endocrine: °Negative for endocrine symptoms ° °Musculoskeletal: °Negative for musculoskeletal symptoms ° °Neurological: °Negative for neurological symptoms ° °Psychologic: °Negative for psychiatric symptoms °

## 2021-04-02 ENCOUNTER — Telehealth: Payer: Self-pay

## 2021-04-02 NOTE — Telephone Encounter (Signed)
Message left to return call to office. Blood pressure medication is not prescribed from this office.

## 2021-04-02 NOTE — Telephone Encounter (Signed)
Pharmacy advised patient Blood Pressure medication had not been sent in to them. Patient advised the pharmacy would have her medication ready for pick up tomorrow if it could be called in today.

## 2021-04-05 NOTE — Telephone Encounter (Signed)
Patient called stating that you was going to send in a bp medication for her. Please advise.

## 2021-04-06 NOTE — Interval H&P Note (Signed)
History and Physical Interval Note:  04/06/2021 9:41 AM  Sandra Carroll  has presented today for surgery, with the diagnosis of bilateral renal calculi.  The various methods of treatment have been discussed with the patient and family. After consideration of risks, benefits and other options for treatment, the patient has consented to  Procedure(s): CYSTOSCOPY WITH BILATERAL RETROGRADE PYELOGRAM, BILATERAL URETEROSCOPY AND RIGHT URETERAL STENT PLACEMENT STONE EXTRACTION WITH BASKET BILATERAL as a surgical intervention.  The patient's history has been reviewed, patient examined, no change in status, stable for surgery.  I have reviewed the patient's chart and labs.  Questions were answered to the patient's satisfaction.     [name]

## 2021-04-06 NOTE — Telephone Encounter (Signed)
Patient called and made aware.

## 2021-04-20 ENCOUNTER — Encounter: Payer: Self-pay | Admitting: Family Medicine

## 2021-04-20 ENCOUNTER — Encounter: Payer: PRIVATE HEALTH INSURANCE | Admitting: Family Medicine

## 2021-05-10 ENCOUNTER — Ambulatory Visit (HOSPITAL_COMMUNITY)
Admission: RE | Admit: 2021-05-10 | Discharge: 2021-05-10 | Disposition: A | Discharge status: Home / Self Care | Payer: PRIVATE HEALTH INSURANCE | Source: Ambulatory Visit | Attending: Urology | Admitting: Urology

## 2021-05-10 ENCOUNTER — Other Ambulatory Visit: Payer: Self-pay

## 2021-05-10 DIAGNOSIS — N200 Calculus of kidney: Secondary | ICD-10-CM | POA: Insufficient documentation

## 2021-05-17 ENCOUNTER — Ambulatory Visit: Payer: PRIVATE HEALTH INSURANCE | Admitting: Urology

## 2021-05-17 ENCOUNTER — Other Ambulatory Visit: Payer: Self-pay | Admitting: Urology

## 2021-05-28 ENCOUNTER — Encounter: Payer: PRIVATE HEALTH INSURANCE | Admitting: Family Medicine

## 2021-06-24 ENCOUNTER — Ambulatory Visit (INDEPENDENT_AMBULATORY_CARE_PROVIDER_SITE_OTHER): Payer: PRIVATE HEALTH INSURANCE | Admitting: Family Medicine

## 2021-06-24 ENCOUNTER — Other Ambulatory Visit (HOSPITAL_COMMUNITY)
Admission: RE | Admit: 2021-06-24 | Discharge: 2021-06-24 | Disposition: A | Discharge status: Home / Self Care | Payer: Medicaid Other | Source: Ambulatory Visit | Attending: Family Medicine | Admitting: Family Medicine

## 2021-06-24 ENCOUNTER — Encounter: Payer: Self-pay | Admitting: Family Medicine

## 2021-06-24 VITALS — BP 124/80 | HR 87 | Temp 97.9°F | Ht 62.0 in | Wt 208.0 lb

## 2021-06-24 DIAGNOSIS — E538 Deficiency of other specified B group vitamins: Secondary | ICD-10-CM

## 2021-06-24 DIAGNOSIS — I10 Essential (primary) hypertension: Secondary | ICD-10-CM

## 2021-06-24 DIAGNOSIS — M1990 Unspecified osteoarthritis, unspecified site: Secondary | ICD-10-CM

## 2021-06-24 DIAGNOSIS — Z113 Encounter for screening for infections with a predominantly sexual mode of transmission: Secondary | ICD-10-CM | POA: Insufficient documentation

## 2021-06-24 DIAGNOSIS — Z124 Encounter for screening for malignant neoplasm of cervix: Secondary | ICD-10-CM | POA: Insufficient documentation

## 2021-06-24 DIAGNOSIS — Z0001 Encounter for general adult medical examination with abnormal findings: Secondary | ICD-10-CM | POA: Diagnosis not present

## 2021-06-24 DIAGNOSIS — F332 Major depressive disorder, recurrent severe without psychotic features: Secondary | ICD-10-CM

## 2021-06-24 DIAGNOSIS — Z1151 Encounter for screening for human papillomavirus (HPV): Secondary | ICD-10-CM | POA: Insufficient documentation

## 2021-06-24 DIAGNOSIS — D509 Iron deficiency anemia, unspecified: Secondary | ICD-10-CM

## 2021-06-24 DIAGNOSIS — F419 Anxiety disorder, unspecified: Secondary | ICD-10-CM

## 2021-06-24 DIAGNOSIS — Z0000 Encounter for general adult medical examination without abnormal findings: Secondary | ICD-10-CM

## 2021-06-24 DIAGNOSIS — E7800 Pure hypercholesterolemia, unspecified: Secondary | ICD-10-CM

## 2021-06-24 MED ORDER — CITALOPRAM HYDROBROMIDE 20 MG PO TABS: 20.0000 mg | ORAL_TABLET | Freq: Every day | ORAL | 1 refills | Status: DC

## 2021-06-24 MED ORDER — LISINOPRIL 20 MG PO TABS: 20.0000 mg | ORAL_TABLET | Freq: Every day | ORAL | 1 refills | Status: DC

## 2021-06-24 MED ORDER — DICLOFENAC SODIUM 75 MG PO TBEC: 75.0000 mg | DELAYED_RELEASE_TABLET | Freq: Two times a day (BID) | ORAL | 2 refills | Status: DC | PRN

## 2021-06-24 MED ORDER — SIMVASTATIN 20 MG PO TABS: 20.0000 mg | ORAL_TABLET | Freq: Every day | ORAL | 1 refills | Status: DC

## 2021-06-24 NOTE — Progress Notes (Signed)
Assessment & Plan:  1. Well adult exam - preventive health information provided - encouraged healthy diet and exercise - Anemia Profile B - CMP14+EGFR - Lipid panel  2. Major depressive disorder, recurrent severe without psychotic features (Sandra Carroll) -Well controlled on current regimen - citalopram (CELEXA) 20 MG tablet; Take 1 tablet (20 mg total) by mouth daily.  Dispense: 90 tablet; Refill: 1 - CMP14+EGFR  3. Anxiety -Well controlled on current regimen - citalopram (CELEXA) 20 MG tablet; Take 1 tablet (20 mg total) by mouth daily.  Dispense: 90 tablet; Refill: 1 - CMP14+EGFR  4. Essential hypertension -Well controlled on current regimen - lisinopril (ZESTRIL) 20 MG tablet; Take 1 tablet (20 mg total) by mouth daily.  Dispense: 90 tablet; Refill: 1 - Anemia Profile B - CMP14+EGFR - Lipid panel  5. Pure hypercholesterolemia -Well controlled on current regimen - simvastatin (ZOCOR) 20 MG tablet; Take 1 tablet (20 mg total) by mouth daily at 6 PM.  Dispense: 90 tablet; Refill: 1 - CMP14+EGFR - Lipid panel  6. Vitamin B12 deficiency - Anemia Profile B  7. Iron deficiency anemia, unspecified iron deficiency anemia type - Anemia Profile B  8. Arthritis - educated on avoidance of other NSAIDs to prevent GI and renal harm - offerred referral to ortho for further work up, patient declined - diclofenac (VOLTAREN) 75 MG EC tablet; Take 1 tablet (75 mg total) by mouth 2 (two) times daily as needed.  Dispense: 60 tablet; Refill: 2  9-11. Screening for cervical cancer/Routine screening for STI (sexually transmitted infection)/Screening for human papillomavirus (HPV) - Cytology - PAP   Follow-up: Return in about 6 months (around 12/22/2021) for follow-up of chronic medication conditions.   Lucile Crater, NP Student   I personally was present during the history, physical exam, and medical decision-making activities of this service and have verified that the service and findings are  accurately documented in the nurse practitioner student's note.  Hendricks Limes, MSN, APRN, FNP-C [facility]  Subjective:  Patient ID: Sandra Carroll, female    DOB: [birth year]  Age: 55 y.o. MRN: [id number]  Patient Care Team: Loman Brooklyn, FNP as PCP - General (Family Medicine) Gala Romney Cristopher Estimable, MD as Consulting Physician (Gastroenterology) Loman Brooklyn, FNP (Family Medicine)   CC:  Chief Complaint  Patient presents with   Gynecologic Exam   Hip Pain    Right- on going pain on and off     HPI Sandra Carroll presents for annual physical with pap.   Hypertension: controlled with lisinopril.   Hyperlipidemia: controlled with simvastatin. The 10-year ASCVD risk score (Arnett DK, et al., 2019) is: 2%   Values used to calculate the score:     Age: 54 years     Sex: Female     Is Non-Hispanic African American: No     Diabetic: No     Tobacco smoker: No     Systolic Blood Pressure: 748 mmHg     Is BP treated: Yes     HDL Cholesterol: 65 mg/dL     Total Cholesterol: 229 mg/dL   Health Maintenance: Occupation: employed, Marital status: engaged, Substance use: none Diet: regular, Exercise: exercise at work, lots of walking Last eye exam: long time, will think about scheduling Last dental exam: [facility] in [place] colonoscopy: 2014 Last mammogram: 03/2020, reminded to schedule  Last pap smear: will complete today Lung Cancer Screening with low-dose Chest CT: declined Hepatitis C Screening: completed 12/2019  Immunizations:  Flu Vaccine: declined Tdap Vaccine: declined  Shingrix Vaccine: declined  COVID-19 Vaccine: declined  Depression/Anxiety: controlled with Celexa.  Depression screen [facility] 2/9 06/24/2021 12/09/2020 10/28/2020  Decreased Interest 2 2 3   Down, Depressed, Hopeless 0 0 3  PHQ - 2 Score 2 2 6   Altered sleeping 0 0 3  Tired, decreased energy 0 1 3  Change in appetite 0 0 0  Feeling bad or failure about yourself  0  0 3  Trouble concentrating 0 0 3  Moving slowly or fidgety/restless 0 0 3  Suicidal thoughts 0 0 3  PHQ-9 Score 2 3 24   Difficult doing work/chores Not difficult at all Not difficult at all Not difficult at all  Some recent data might be hidden   GAD 7 : Generalized Anxiety Score 06/24/2021 12/09/2020 10/28/2020 07/14/2020  Nervous, Anxious, on Edge 0 0 1 0  Control/stop worrying 0 0 1 0  Worry too much - different things 1 2 1  0  Trouble relaxing 0 0 3 0  Restless 0 0 2 0  Easily annoyed or irritable 0 0 1 0  Afraid - awful might happen 0 0 0 0  Total GAD 7 Score 1 2 9  0  Anxiety Difficulty Not difficult at all Not difficult at all Not difficult at all Not difficult at all   B12 Deficiency: controlled with B12 supplement  Chronic right hip pain: began several years ago after a fall. She states that the pain originally began in her hip but is now in her low back and is radiating down her right leg. She has not seen anyone about this and is not interested in an evaluation for it. She has been taking 800 mg ibuprofen that has been helping keep it calm. She states prior to taking ibuprofen her son in law gave her an "anti-inflammatory medication" that help calm her hip down, but she does not remember what the medication was.    Review of Systems  Constitutional: Negative.  Negative for chills, fever, malaise/fatigue and weight loss.  HENT: Negative.  Negative for congestion, ear discharge, ear pain, hearing loss and sinus pain.   Eyes: Negative.  Negative for blurred vision, pain, discharge and redness.  Respiratory: Negative.  Negative for cough, shortness of breath and wheezing.   Cardiovascular: Negative.  Negative for chest pain, palpitations, orthopnea and leg swelling.  Gastrointestinal: Negative.  Negative for abdominal pain, constipation, diarrhea, heartburn, nausea and vomiting.  Genitourinary: Negative.  Negative for dysuria, frequency and urgency.  Musculoskeletal:  Positive for  joint pain (right hip) and myalgias (lower back and right leg). Negative for back pain, falls and neck pain.  Skin: Negative.  Negative for itching and rash.  Neurological: Negative.  Negative for dizziness, tremors, weakness and headaches.  Endo/Heme/Allergies: Negative.  Negative for environmental allergies and polydipsia. Does not bruise/bleed easily.  Psychiatric/Behavioral:  Negative for depression, memory loss and substance abuse. The patient is not nervous/anxious and does not have insomnia.     Current Outpatient Medications:    citalopram (CELEXA) 20 MG tablet, Take 1 tablet (20 mg total) by mouth daily., Disp: 30 tablet, Rfl: 5   indapamide (LOZOL) 2.5 MG tablet, Take 1 tablet (2.5 mg total) by mouth daily., Disp: 30 tablet, Rfl: 11   lisinopril (ZESTRIL) 20 MG tablet, Take 1 tablet (20 mg total) by mouth daily., Disp: 30 tablet, Rfl: 5   meloxicam (MOBIC) 7.5 MG tablet, Take 1 tablet (7.5 mg total) by mouth daily., Disp: 30  tablet, Rfl: 2   simvastatin (ZOCOR) 20 MG tablet, Take 1 tablet (20 mg total) by mouth daily at 6 PM., Disp: 30 tablet, Rfl: 5   vitamin B-12 (CYANOCOBALAMIN) 1000 MCG tablet, Take 1,000 mcg by mouth daily., Disp: , Rfl:   No Known Allergies  Past Medical History:  Diagnosis Date   Acute pyelonephritis 05/07/2020   Anxiety    Chronic back pain    Complication of anesthesia 1991 and 1998   childbirth --reaction to anesthesia was itching.    DDD (degenerative disc disease)    Depression    Headache    migraines   Hemorrhoids    History of kidney stones    Hyperlipidemia    Hypertension    Iron deficiency anemia 07/20/2020   Kidney stone    PONV (postoperative nausea and vomiting)    Vitamin B12 deficiency 07/20/2020    Past Surgical History:  Procedure Laterality Date   [place]   pt had itching with 1991 c-section.    CHOLECYSTECTOMY     COLONOSCOPY N/A 03/01/2013   Dr. Gala Romney- normal rectum except for  anal canal/internal hemorrhoidal tags. colonic mucosa appeared normal.   CYSTOSCOPY W/ URETERAL STENT PLACEMENT Left 12/09/2019   Procedure: CYSTOSCOPY WITH RETROGRADE PYELOGRAM/URETERAL STENT PLACEMENT;  Surgeon: Irine Seal, MD;  Location: [facility];  Service: Urology;  Laterality: Left;   CYSTOSCOPY W/ URETERAL STENT PLACEMENT Right 05/07/2020   Procedure: CYSTOSCOPY WITH RETROGRADE PYELOGRAM/URETERAL STENT PLACEMENT;  Surgeon: Festus Aloe, MD;  Location: [facility];  Service: Urology;  Laterality: Right;   CYSTOSCOPY WITH RETROGRADE PYELOGRAM, URETEROSCOPY AND STENT PLACEMENT Right 06/02/2020   Procedure: CYSTOSCOPY WITH RETROGRADE PYELOGRAM, URETEROSCOPY, BASKET EXTRACTION OF STONES AND STENT EXCHANGE;  Surgeon: Cleon Gustin, MD;  Location: [facility];  Service: Urology;  Laterality: Right;   CYSTOSCOPY WITH RETROGRADE PYELOGRAM, URETEROSCOPY AND STENT PLACEMENT  03/11/2021   Procedure: CYSTOSCOPY WITH BILATERAL RETROGRADE PYELOGRAM, BILATERAL URETEROSCOPY AND RIGHT URETERAL STENT PLACEMENT;  Surgeon: Cleon Gustin, MD;  Location: [facility];  Service: Urology;;   CYSTOSCOPY/URETEROSCOPY/HOLMIUM LASER/STENT PLACEMENT Left 12/17/2019   Procedure: LEFT URETEROSCOPY/STENT EXCHANGE/ EXTRACTION OF LEFT URETERAL CALCULI;  Surgeon: Irine Seal, MD;  Location: [facility];  Service: Urology;  Laterality: Left;   KNEE ARTHROSCOPY Right 2003   LITHOTRIPSY     LUMBAR LAMINECTOMY/DECOMPRESSION MICRODISCECTOMY  03/02/2012   Procedure: LUMBAR LAMINECTOMY/DECOMPRESSION MICRODISCECTOMY 1 LEVEL;  Surgeon: Winfield Cunas, MD;  Location: [facility];  Service: Neurosurgery;  Laterality: Left;  LEFT Lumbar Five-Sacral One Laminectomy for Decompression    STONE EXTRACTION WITH BASKET  03/11/2021   Procedure: STONE EXTRACTION WITH BASKET BILATERAL;  Surgeon: Cleon Gustin, MD;  Location: [facility];  Service: Urology;;    Family History  Problem Relation Age of Onset   Hypertension Mother    Cancer Father     Hypertension Father    Colon cancer Neg Hx     Social History   Socioeconomic History   Marital status: Single    Spouse name: Not on file   Number of children: Not on file   Years of education: Not on file   Highest education level: Not on file  Occupational History   Not on file  Tobacco Use   Smoking status: Former    Packs/day: 0.25    Years: 30.00    Pack years: 7.50    Types: Cigarettes    Quit date: 2010    Years since quitting:  13.0   Smokeless tobacco: Never  Vaping Use   Vaping Use: Never used  Substance and Sexual Activity   Alcohol use: Yes    Comment: rare   Drug use: Never   Sexual activity: Not Currently    Birth control/protection: Pill  Other Topics Concern   Not on file  Social History Narrative   ** Merged History Encounter **       Social Determinants of Health   Financial Resource Strain: Not on file  Food Insecurity: Not on file  Transportation Needs: Not on file  Physical Activity: Not on file  Stress: Not on file  Social Connections: Not on file  Intimate Partner Violence: Not on file      Objective:    BP 124/80    Pulse 87    Temp 97.9 F (36.6 C) (Temporal)    Ht 5' 2"  (1.575 m)    Wt 94.3 kg    LMP 10/17/2019 (Approximate)    SpO2 95%    BMI 38.04 kg/m   Wt Readings from Last 3 Encounters:  06/24/21 208 lb (94.3 kg)  03/17/21 206 lb 3.2 oz (93.5 kg)  03/10/21 205 lb (93 kg)    Physical Exam Vitals and nursing note reviewed. Exam conducted with a chaperone present.  Constitutional:      General: She is not in acute distress.    Appearance: Normal appearance. She is obese. She is not ill-appearing, toxic-appearing or diaphoretic.  HENT:     Head: Normocephalic and atraumatic.     Right Ear: Tympanic membrane, ear canal and external ear normal.     Left Ear: Tympanic membrane, ear canal and external ear normal.     Nose: Nose normal. No congestion.     Mouth/Throat:     Mouth: Mucous membranes are moist.     Pharynx:  Oropharynx is clear. No oropharyngeal exudate or posterior oropharyngeal erythema.  Eyes:     Extraocular Movements: Extraocular movements intact.     Conjunctiva/sclera: Conjunctivae normal.     Pupils: Pupils are equal, round, and reactive to light.  Cardiovascular:     Rate and Rhythm: Normal rate and regular rhythm.     Pulses: Normal pulses.     Heart sounds: Normal heart sounds.  Pulmonary:     Effort: Pulmonary effort is normal. No respiratory distress.     Breath sounds: Normal breath sounds. No wheezing or rhonchi.  Abdominal:     General: Bowel sounds are normal. There is no distension.     Palpations: Abdomen is soft. There is no mass.  Genitourinary:    General: Normal vulva.     Exam position: Lithotomy position.     Labia:        Right: No rash, tenderness, lesion or injury.        Left: No rash, tenderness, lesion or injury.      Vagina: Normal.     Cervix: Discharge (mild white) present.     Uterus: Normal.      Adnexa: Right adnexa normal and left adnexa normal.       Right: No mass, tenderness or fullness.         Left: No mass, tenderness or fullness.       Rectum: Normal.  Musculoskeletal:        General: Normal range of motion.     Cervical back: Normal range of motion.  Skin:    General: Skin is warm and dry.  Capillary Refill: Capillary refill takes less than 2 seconds.  Neurological:     General: No focal deficit present.     Mental Status: She is alert and oriented to person, place, and time.     Motor: No weakness.     Gait: Gait normal.  Psychiatric:        Mood and Affect: Mood normal.        Behavior: Behavior normal.        Thought Content: Thought content normal.        Judgment: Judgment normal.    Lab Results  Component Value Date   TSH 0.553 10/28/2020   Lab Results  Component Value Date   WBC 7.6 03/10/2021   HGB 12.9 03/10/2021   HCT 39.8 03/10/2021   MCV 98.3 03/10/2021   PLT 264 03/10/2021   Lab Results  Component  Value Date   NA 139 10/28/2020   K 4.3 10/28/2020   CO2 21 10/28/2020   GLUCOSE 89 10/28/2020   BUN 16 10/28/2020   CREATININE 1.06 (H) 10/28/2020   BILITOT <0.2 10/28/2020   ALKPHOS 85 10/28/2020   AST 14 10/28/2020   ALT 11 10/28/2020   PROT 7.3 10/28/2020   ALBUMIN 3.7 (L) 10/28/2020   CALCIUM 10.1 10/28/2020   ANIONGAP 6 05/09/2020   EGFR 63 10/28/2020   Lab Results  Component Value Date   CHOL 229 (H) 07/14/2020   Lab Results  Component Value Date   HDL 65 07/14/2020   Lab Results  Component Value Date   LDLCALC 148 (H) 07/14/2020   Lab Results  Component Value Date   TRIG 93 07/14/2020   Lab Results  Component Value Date   CHOLHDL 3.5 07/14/2020   No results found for: HGBA1C

## 2021-06-24 NOTE — Patient Instructions (Signed)
Avoid Ibuprofen, Advil, Aleve, Motrin, Goody Powders, Naproxen, BC powders, Meloxicam, Indomethacin and other nonsteroidal anti-inflammatory medications (NSAIDs) since you are now prescribed Diclofenac.  Please schedule your mammogram.

## 2021-06-25 ENCOUNTER — Other Ambulatory Visit: Payer: Self-pay | Admitting: Family Medicine

## 2021-06-25 ENCOUNTER — Encounter: Payer: Self-pay | Admitting: Family Medicine

## 2021-06-25 DIAGNOSIS — Z1231 Encounter for screening mammogram for malignant neoplasm of breast: Secondary | ICD-10-CM

## 2021-06-25 LAB — ANEMIA PROFILE B
Basophils Absolute: 0.1 10*3/uL (ref 0.0–0.2)
Basos: 1 %
EOS (ABSOLUTE): 0.1 10*3/uL (ref 0.0–0.4)
Eos: 2 %
Ferritin: 87 ng/mL (ref 15–150)
Folate: 5.5 ng/mL (ref >3.0)
Hematocrit: 38.3 % (ref 34.0–46.6)
Hemoglobin: 13.1 g/dL (ref 11.1–15.9)
Immature Grans (Abs): 0 10*3/uL (ref 0.0–0.1)
Immature Granulocytes: 0 %
Iron Saturation: 16 % (ref 15–55)
Iron: 49 ug/dL (ref 27–159)
Lymphocytes Absolute: 2.6 10*3/uL (ref 0.7–3.1)
Lymphs: 36 %
MCH: 31.5 pg (ref 26.6–33.0)
MCHC: 34.2 g/dL (ref 31.5–35.7)
MCV: 92 fL (ref 79–97)
Monocytes Absolute: 0.5 10*3/uL (ref 0.1–0.9)
Monocytes: 6 %
Neutrophils Absolute: 4.1 10*3/uL (ref 1.4–7.0)
Neutrophils: 55 %
Platelets: 312 10*3/uL (ref 150–450)
RBC: 4.16 x10E6/uL (ref 3.77–5.28)
RDW: 13.1 % (ref 11.7–15.4)
Retic Ct Pct: 1.1 % (ref 0.6–2.6)
Total Iron Binding Capacity: 302 ug/dL (ref 250–450)
UIBC: 253 ug/dL (ref 131–425)
Vitamin B-12: 433 pg/mL (ref 232–1245)
WBC: 7.3 10*3/uL (ref 3.4–10.8)

## 2021-06-25 LAB — CMP14+EGFR
ALT: 15 IU/L (ref 0–32)
AST: 21 IU/L (ref 0–40)
Albumin/Globulin Ratio: 1.4 (ref 1.2–2.2)
Albumin: 4.2 g/dL (ref 3.8–4.9)
Alkaline Phosphatase: 138 IU/L — ABNORMAL HIGH (ref 44–121)
BUN/Creatinine Ratio: 20 (ref 9–23)
BUN: 19 mg/dL (ref 6–24)
Bilirubin Total: <0.2 mg/dL (ref 0.0–1.2)
CO2: 24 mmol/L (ref 20–29)
Calcium: 10.6 mg/dL — ABNORMAL HIGH (ref 8.7–10.2)
Chloride: 105 mmol/L (ref 96–106)
Creatinine, Ser: 0.95 mg/dL (ref 0.57–1.00)
Globulin, Total: 3.1 g/dL (ref 1.5–4.5)
Glucose: 91 mg/dL (ref 70–99)
Potassium: 4.2 mmol/L (ref 3.5–5.2)
Sodium: 142 mmol/L (ref 134–144)
Total Protein: 7.3 g/dL (ref 6.0–8.5)
eGFR: 72 mL/min/{1.73_m2} (ref >59)

## 2021-06-25 LAB — LIPID PANEL
Chol/HDL Ratio: 3.5 ratio (ref 0.0–4.4)
Cholesterol, Total: 177 mg/dL (ref 100–199)
HDL: 50 mg/dL (ref >39)
LDL Chol Calc (NIH): 98 mg/dL (ref 0–99)
Triglycerides: 167 mg/dL — ABNORMAL HIGH (ref 0–149)
VLDL Cholesterol Cal: 29 mg/dL (ref 5–40)

## 2021-06-28 ENCOUNTER — Encounter: Payer: Self-pay | Admitting: Urology

## 2021-06-28 ENCOUNTER — Other Ambulatory Visit: Payer: Self-pay

## 2021-06-28 ENCOUNTER — Ambulatory Visit (INDEPENDENT_AMBULATORY_CARE_PROVIDER_SITE_OTHER): Payer: PRIVATE HEALTH INSURANCE | Admitting: Urology

## 2021-06-28 DIAGNOSIS — N201 Calculus of ureter: Secondary | ICD-10-CM

## 2021-06-28 DIAGNOSIS — N200 Calculus of kidney: Secondary | ICD-10-CM | POA: Diagnosis not present

## 2021-06-28 LAB — URINALYSIS, ROUTINE W REFLEX MICROSCOPIC
Bilirubin, UA: NEGATIVE
Glucose, UA: NEGATIVE
Ketones, UA: NEGATIVE
Nitrite, UA: NEGATIVE
Protein,UA: NEGATIVE
RBC, UA: NEGATIVE
Specific Gravity, UA: 1.025 (ref 1.005–1.030)
Urobilinogen, Ur: 0.2 mg/dL (ref 0.2–1.0)
pH, UA: 5.5 (ref 5.0–7.5)

## 2021-06-28 LAB — CYTOLOGY - PAP
Adequacy: ABSENT
Chlamydia: NEGATIVE
Comment: NEGATIVE
Comment: NEGATIVE
Comment: NEGATIVE
Comment: NORMAL
Diagnosis: NEGATIVE
High risk HPV: NEGATIVE
Neisseria Gonorrhea: NEGATIVE
Trichomonas: NEGATIVE

## 2021-06-28 MED ORDER — POTASSIUM CITRATE ER 15 MEQ (1620 MG) PO TBCR: 1.0000 | EXTENDED_RELEASE_TABLET | Freq: Two times a day (BID) | ORAL | 11 refills | Status: DC

## 2021-06-28 NOTE — Progress Notes (Signed)
06/28/2021 2:55 PM   Sandra Carroll 1965/07/18 [id number]  Referring provider: Loman Carroll, Sandra Carroll,  Sandra Carroll [id number]  Followup nephrolithiasis   HPI: [name] is a 56yo here for followup for nephrolithiasis. Renal US 05/10/2021 shows a punctate right lower pole calculus. 24 hour urine shows a low volume 1.1L, calcium 194, citrate 338. No stone events since last visit. No flank pain. No worsening LUTS   PMH: Past Medical History:  Diagnosis Date   Acute pyelonephritis 05/07/2020   Anxiety    Chronic back pain    Complication of anesthesia 1991 and 1998   childbirth --reaction to anesthesia was itching.    DDD (degenerative disc disease)    Depression    Headache    migraines   Hemorrhoids    History of kidney stones    Hyperlipidemia    Hypertension    Iron deficiency anemia 07/20/2020   Kidney stone    PONV (postoperative nausea and vomiting)    Vitamin B12 deficiency 07/20/2020    Surgical History: Past Surgical History:  Procedure Laterality Date   [place]   pt had itching with 1991 c-section.    CHOLECYSTECTOMY     COLONOSCOPY N/A 03/01/2013   Dr. Gala Romney- normal rectum except for anal canal/internal hemorrhoidal tags. colonic mucosa appeared normal.   CYSTOSCOPY W/ URETERAL STENT PLACEMENT Left 12/09/2019   Procedure: CYSTOSCOPY WITH RETROGRADE PYELOGRAM/URETERAL STENT PLACEMENT;  Surgeon: Irine Seal, MD;  Location: [facility];  Service: Urology;  Laterality: Left;   CYSTOSCOPY W/ URETERAL STENT PLACEMENT Right 05/07/2020   Procedure: CYSTOSCOPY WITH RETROGRADE PYELOGRAM/URETERAL STENT PLACEMENT;  Surgeon: Festus Aloe, MD;  Location: [facility];  Service: Urology;  Laterality: Right;   CYSTOSCOPY WITH RETROGRADE PYELOGRAM, URETEROSCOPY AND STENT PLACEMENT Right 06/02/2020   Procedure: CYSTOSCOPY WITH RETROGRADE PYELOGRAM, URETEROSCOPY, BASKET EXTRACTION OF STONES AND STENT EXCHANGE;  Surgeon:  Cleon Gustin, MD;  Location: [facility];  Service: Urology;  Laterality: Right;   CYSTOSCOPY WITH RETROGRADE PYELOGRAM, URETEROSCOPY AND STENT PLACEMENT  03/11/2021   Procedure: CYSTOSCOPY WITH BILATERAL RETROGRADE PYELOGRAM, BILATERAL URETEROSCOPY AND RIGHT URETERAL STENT PLACEMENT;  Surgeon: Cleon Gustin, MD;  Location: [facility];  Service: Urology;;   CYSTOSCOPY/URETEROSCOPY/HOLMIUM LASER/STENT PLACEMENT Left 12/17/2019   Procedure: LEFT URETEROSCOPY/STENT EXCHANGE/ EXTRACTION OF LEFT URETERAL CALCULI;  Surgeon: Irine Seal, MD;  Location: [facility];  Service: Urology;  Laterality: Left;   KNEE ARTHROSCOPY Right 2003   LITHOTRIPSY     LUMBAR LAMINECTOMY/DECOMPRESSION MICRODISCECTOMY  03/02/2012   Procedure: LUMBAR LAMINECTOMY/DECOMPRESSION MICRODISCECTOMY 1 LEVEL;  Surgeon: Winfield Cunas, MD;  Location: [place] [facility];  Service: Neurosurgery;  Laterality: Left;  LEFT Lumbar Five-Sacral One Laminectomy for Decompression    STONE EXTRACTION WITH BASKET  03/11/2021   Procedure: STONE EXTRACTION WITH BASKET BILATERAL;  Surgeon: Cleon Gustin, MD;  Location: [facility];  Service: Urology;;    Home Medications:  Allergies as of 06/28/2021   No Known Allergies      Medication List        Accurate as of June 28, 2021  2:55 PM. If you have any questions, ask your nurse or doctor.          citalopram 20 MG tablet Commonly known as: CeleXA Take 1 tablet (20 mg total) by mouth daily.   diclofenac 75 MG EC tablet Commonly known as: VOLTAREN Take 1 tablet (75 mg total) by mouth 2 (two) times daily as  needed.   lisinopril 20 MG tablet Commonly known as: ZESTRIL Take 1 tablet (20 mg total) by mouth daily.   simvastatin 20 MG tablet Commonly known as: ZOCOR Take 1 tablet (20 mg total) by mouth daily at 6 PM.   vitamin B-12 1000 MCG tablet Commonly known as: CYANOCOBALAMIN Take 1,000 mcg by mouth daily.        Allergies: No Known Allergies  Family History: Family History   Problem Relation Age of Onset   Hypertension Mother    Cancer Father    Hypertension Father    Colon cancer Neg Hx     Social History:  reports that she quit smoking about 13 years ago. Her smoking use included cigarettes. She has a 7.50 pack-year smoking history. She has never used smokeless tobacco. She reports current alcohol use. She reports that she does not use drugs.  ROS: All other review of systems were reviewed and are negative except what is noted above in HPI  Physical Exam: BP 114/71    Pulse 96    Ht 5\' 2"  (1.575 m)    Wt 208 lb (94.3 kg)    LMP 10/17/2019 (Approximate)    BMI 38.04 kg/m   Constitutional:  Alert and oriented, No acute distress. HEENT: [place] AT, moist mucus membranes.  Trachea midline, no masses. Cardiovascular: No clubbing, cyanosis, or edema. Respiratory: Normal respiratory effort, no increased work of breathing. GI: Abdomen is soft, nontender, nondistended, no abdominal masses GU: No CVA tenderness.  Lymph: No cervical or inguinal lymphadenopathy. Skin: No rashes, bruises or suspicious lesions. Neurologic: Grossly intact, no focal deficits, moving all 4 extremities. Psychiatric: Normal mood and affect.  Laboratory Data: Lab Results  Component Value Date   WBC 7.3 06/24/2021   HGB 13.1 06/24/2021   HCT 38.3 06/24/2021   MCV 92 06/24/2021   PLT 312 06/24/2021    Lab Results  Component Value Date   CREATININE 0.95 06/24/2021    No results found for: PSA  No results found for: TESTOSTERONE  No results found for: HGBA1C  Urinalysis    Component Value Date/Time   COLORURINE YELLOW 05/07/2020 1233   APPEARANCEUR Clear 03/17/2021 0903   LABSPEC 1.013 05/07/2020 1233   PHURINE 6.0 05/07/2020 1233   GLUCOSEU Negative 03/17/2021 0903   HGBUR SMALL (A) 05/07/2020 1233   BILIRUBINUR Negative 03/17/2021 0903   KETONESUR NEGATIVE 05/07/2020 1233   PROTEINUR Negative 03/17/2021 0903   PROTEINUR 100 (A) 05/07/2020 1233   UROBILINOGEN 0.2  07/25/2014 0215   NITRITE Negative 03/17/2021 0903   NITRITE NEGATIVE 05/07/2020 1233   LEUKOCYTESUR Negative 03/17/2021 0903   LEUKOCYTESUR LARGE (A) 05/07/2020 1233    Lab Results  Component Value Date   LABMICR See below: 03/17/2021   WBCUA None seen 03/17/2021   LABEPIT None seen 03/17/2021   MUCUS Present 06/10/2020   BACTERIA Few (A) 03/17/2021    Pertinent Imaging: Renal US 05/10/2021: Images reviewed and discussed with the patient Results for orders placed during the hospital encounter of 12/31/20  Abdomen 1 view (KUB)  Narrative CLINICAL DATA:  Nephrolithiasis.  EXAM: ABDOMEN - 1 VIEW  COMPARISON:  None.  FINDINGS: Multiple calcifications in the pelvis. Both kidneys are largely obscured by bowel contents. No definite renal stones are seen on this study. No ureteral stones identified. No other abnormalities.  IMPRESSION: The kidneys are largely obscured by bowel contents but no stones are seen.  Calcifications in the pelvis are nonspecific but may all be phleboliths. No other evidence  of ureteral stone.   Electronically Signed By: Dorise Bullion III M.D On: 01/03/2021 19:43  No results found for this or any previous visit.  No results found for this or any previous visit.  No results found for this or any previous visit.  Results for orders placed during the hospital encounter of 05/10/21  Ultrasound renal complete  Narrative CLINICAL DATA:  Nephrolithiasis.  EXAM: RENAL / URINARY TRACT ULTRASOUND COMPLETE  COMPARISON:  Abdominal ultrasound, 12/28/2020 and 10/01/2020. CT [facility], 05/07/2020 and 12/09/2019.  FINDINGS: Right Kidney:  Renal measurements: 11.6 x 6.7 x 6.0 cm = volume: 242 mL. Echogenicity within normal limits. No mass or hydronephrosis.  Punctate echogenic focus with "twinkle" at the RIGHT inferior renal collecting system, measuring 0.5 cm.  Left Kidney:  Renal measurements: 9.6 x 5.0 x 5.5 cm = volume: 136  mL. Echogenicity within normal limits. No mass or hydronephrosis visualized.  Bladder:  Appears normal for degree of bladder distention. No postvoid residual.  Other:  No ascites.  IMPRESSION: Nonobstructing RIGHT nephrolithiasis.   Electronically Signed By: Michaelle Birks M.D. On: 05/10/2021 11:19  No results found for this or any previous visit.  No results found for this or any previous visit.  Results for orders placed during the hospital encounter of 05/07/20  CT Renal Stone Study  Narrative CLINICAL DATA:  Right-sided lower back pain  EXAM: CT ABDOMEN AND PELVIS WITHOUT CONTRAST  TECHNIQUE: Multidetector CT imaging of the abdomen and pelvis was performed following the standard protocol without IV contrast.  COMPARISON:  None.  FINDINGS: Lower chest: The visualized heart size within normal limits. No pericardial fluid/thickening.  No hiatal hernia.  The visualized portions of the lungs are clear.  Hepatobiliary: Although limited due to the lack of intravenous contrast, normal in appearance without gross focal abnormality. The patient is status post cholecystectomy. No biliary ductal dilation.  Pancreas:  Unremarkable.  No surrounding inflammatory changes.  Spleen: Normal in size. Although limited due to the lack of intravenous contrast, normal in appearance.  Adrenals/Urinary Tract: Both adrenal glands appear normal. Moderate right pelvicaliectasis and ureterectasis is seen down the level of the distal ureter where there is several ureteral calculi 1 measuring 3 mm and 1 measuring 4 mm. There is right-sided perinephric and proximal periureteral stranding changes. There is a tiny punctate calcification in the posterior right bladder. There is also a a 3 mm calculus seen in the mid pole the right kidney. No left-sided renal or collecting system calculi are noted.  Stomach/Bowel: The stomach, small bowel, and colon are normal in appearance. No  inflammatory changes or obstructive findings. Scattered colonic diverticula are noted.  Vascular/Lymphatic: There are no enlarged abdominal or pelvic lymph nodes. Scattered aortic atherosclerotic calcifications are seen without aneurysmal dilatation.  Reproductive: The uterus and adnexa are unremarkable.  Other: Bilateral fat containing inguinal hernias are noted.  Musculoskeletal: No acute or significant osseous findings.  IMPRESSION: Moderate right hydronephrosis to the distal ureter where there is several calcified ureteral calculi measuring 3 mm and 4 mm. There is also punctate calcification in the posterior right bladder.  Right-sided perinephric and periureteral stranding which could be from the recently passed stone versus pyelonephritis.  Nonobstructing right renal calculus.   Electronically Signed By: Prudencio Pair M.D. On: 05/07/2020 16:09   Assessment & Plan:    1.  Nephrolithiasis -Patient instructed to increase water intake -We will start UrocitK 11meq BID -RTC 2 weeks with BMP -RTC 3 months with renal US   No  follow-ups on file.  Nicolette Bang, MD  [facility]rology [place]

## 2021-06-28 NOTE — Progress Notes (Signed)
Urological Symptom Review  Patient is experiencing the following symptoms: none   Review of Systems  Gastrointestinal (upper)  : Negative for upper GI symptoms  Gastrointestinal (lower) : Negative for lower GI symptoms  Constitutional : Negative for symptoms  Skin: Negative for skin symptoms  Eyes: Negative for eye symptoms  Ear/Nose/Throat : Negative for Ear/Nose/Throat symptoms  Hematologic/Lymphatic: Negative for Hematologic/Lymphatic symptoms  Cardiovascular : Negative for cardiovascular symptoms  Respiratory : Negative for respiratory symptoms  Endocrine: Negative for endocrine symptoms  Musculoskeletal: Negative for musculoskeletal symptoms  Neurological: Headaches Negative for neurological symptoms  Psychologic: Negative for psychiatric symptoms

## 2021-06-28 NOTE — Patient Instructions (Signed)
Dietary Guidelines to Help Prevent Kidney Stones Kidney stones are deposits of minerals and salts that form inside your kidneys. Your risk of developing kidney stones may be greater depending on your diet, your lifestyle, the medicines you take, and whether you have certain medical conditions. Most people can lower their chances of developing kidney stones by following the instructions below. Your dietitian may give you more specific instructions depending on your overall health and the type of kidney stones you tend to develop. What are tips for following this plan? Reading food labels  Choose foods with "no salt added" or "low-salt" labels. Limit your salt (sodium) intake to less than 1,500 mg a day. Choose foods with calcium for each meal and snack. Try to eat about 300 mg of calcium at each meal. Foods that contain 200-500 mg of calcium a serving include: 8 oz (237 mL) of milk, calcium-fortifiednon-dairy milk, and calcium-fortifiedfruit juice. Calcium-fortified means that calcium has been added to these drinks. 8 oz (237 mL) of kefir, yogurt, and soy yogurt. 4 oz (114 g) of tofu. 1 oz (28 g) of cheese. 1 cup (150 g) of dried figs. 1 cup (91 g) of cooked broccoli. One 3 oz (85 g) can of sardines or mackerel. Most people need 1,000-1,500 mg of calcium a day. Talk to your dietitian about how much calcium is recommended for you. Shopping Buy plenty of fresh fruits and vegetables. Most people do not need to avoid fruits and vegetables, even if these foods contain nutrients that may contribute to kidney stones. When shopping for convenience foods, choose: Whole pieces of fruit. Pre-made salads with dressing on the side. Low-fat fruit and yogurt smoothies. Avoid buying frozen meals or prepared deli foods. These can be high in sodium. Look for foods with live cultures, such as yogurt and kefir. Choose high-fiber grains, such as whole-wheat breads, oat bran, and wheat cereals. Cooking Do not add  salt to food when cooking. Place a salt shaker on the table and allow each person to add his or her own salt to taste. Use vegetable protein, such as beans, textured vegetable protein (TVP), or tofu, instead of meat in pasta, casseroles, and soups. Meal planning Eat less salt, if told by your dietitian. To do this: Avoid eating processed or pre-made food. Avoid eating fast food. Eat less animal protein, including cheese, meat, poultry, or fish, if told by your dietitian. To do this: Limit the number of times you have meat, poultry, fish, or cheese each week. Eat a diet free of meat at least 2 days a week. Eat only one serving each day of meat, poultry, fish, or seafood. When you prepare animal protein, cut pieces into small portion sizes. For most meat and fish, one serving is about the size of the palm of your hand. Eat at least five servings of fresh fruits and vegetables each day. To do this: Keep fruits and vegetables on hand for snacks. Eat one piece of fruit or a handful of berries with breakfast. Have a salad and fruit at lunch. Have two kinds of vegetables at dinner. Limit foods that are high in a substance called oxalate. These include: Spinach (cooked), rhubarb, beets, sweet potatoes, and Swiss chard. Peanuts. Potato chips, french fries, and baked potatoes with skin on. Nuts and nut products. Chocolate. If you regularly take a diuretic medicine, make sure to eat at least 1 or 2 servings of fruits or vegetables that are high in potassium each day. These include: Avocado. Banana. Orange, prune,   carrot, or tomato juice. Baked potato. Cabbage. Beans and split peas. Lifestyle  Drink enough fluid to keep your urine pale yellow. This is the most important thing you can do. Spread your fluid intake throughout the day. If you drink alcohol: Limit how much you use to: 0-1 drink a day for women who are not pregnant. 0-2 drinks a day for men. Be aware of how much alcohol is in your  drink. In the [place] one drink equals one 12 oz bottle of beer (355 mL), one 5 oz glass of wine (148 mL), or one 1 oz glass of hard liquor (44 mL). Lose weight if told by your health care provider. Work with your dietitian to find an eating plan and weight loss strategies that work best for you. General information Talk to your health care provider and dietitian about taking daily supplements. You may be told the following depending on your health and the cause of your kidney stones: Not to take supplements with vitamin C. To take a calcium supplement. To take a daily probiotic supplement. To take other supplements such as magnesium, fish oil, or vitamin B6. Take over-the-counter and prescription medicines only as told by your health care provider. These include supplements. What foods should I limit? Limit your intake of the following foods, or eat them as told by your dietitian. Vegetables Spinach. Rhubarb. Beets. Canned vegetables. Pickles. Olives. Baked potatoes with skin. Grains Wheat bran. Baked goods. Salted crackers. Cereals high in sugar. Meats and other proteins Nuts. Nut butters. Large portions of meat, poultry, or fish. Salted, precooked, or cured meats, such as sausages, meat loaves, and hot dogs. Dairy Cheese. Beverages Regular soft drinks. Regular vegetable juice. Seasonings and condiments Seasoning blends with salt. Salad dressings. Soy sauce. Ketchup. Barbecue sauce. Other foods Canned soups. Canned pasta sauce. Casseroles. Pizza. Lasagna. Frozen meals. Potato chips. French fries. The items listed above may not be a complete list of foods and beverages you should limit. Contact a dietitian for more information. What foods should I avoid? Talk to your dietitian about specific foods you should avoid based on the type of kidney stones you have and your overall health. Fruits Grapefruit. The item listed above may not be a complete list of foods and beverages you should  avoid. Contact a dietitian for more information. Summary Kidney stones are deposits of minerals and salts that form inside your kidneys. You can lower your risk of kidney stones by making changes to your diet. The most important thing you can do is drink enough fluid. Drink enough fluid to keep your urine pale yellow. Talk to your dietitian about how much calcium you should have each day, and eat less salt and animal protein as told by your dietitian. This information is not intended to replace advice given to you by your health care provider. Make sure you discuss any questions you have with your health care provider. Document Revised: 05/30/2019 Document Reviewed: 05/30/2019 Elsevier Patient Education  2022 Elsevier Inc.  

## 2021-07-12 ENCOUNTER — Other Ambulatory Visit: Payer: Self-pay

## 2021-07-12 ENCOUNTER — Other Ambulatory Visit: Payer: PRIVATE HEALTH INSURANCE

## 2021-07-12 DIAGNOSIS — N200 Calculus of kidney: Secondary | ICD-10-CM

## 2021-07-13 ENCOUNTER — Ambulatory Visit (INDEPENDENT_AMBULATORY_CARE_PROVIDER_SITE_OTHER): Payer: PRIVATE HEALTH INSURANCE | Admitting: Nurse Practitioner

## 2021-07-13 ENCOUNTER — Encounter: Payer: Self-pay | Admitting: Nurse Practitioner

## 2021-07-13 VITALS — BP 130/81 | HR 87 | Ht 62.0 in | Wt 208.0 lb

## 2021-07-13 DIAGNOSIS — R1032 Left lower quadrant pain: Secondary | ICD-10-CM | POA: Diagnosis not present

## 2021-07-13 DIAGNOSIS — M25552 Pain in left hip: Secondary | ICD-10-CM | POA: Diagnosis not present

## 2021-07-13 LAB — BASIC METABOLIC PANEL
BUN/Creatinine Ratio: 21 (ref 9–23)
BUN: 22 mg/dL (ref 6–24)
CO2: 23 mmol/L (ref 20–29)
Calcium: 10.9 mg/dL — ABNORMAL HIGH (ref 8.7–10.2)
Chloride: 106 mmol/L (ref 96–106)
Creatinine, Ser: 1.06 mg/dL — ABNORMAL HIGH (ref 0.57–1.00)
Glucose: 95 mg/dL (ref 70–99)
Potassium: 5.3 mmol/L — ABNORMAL HIGH (ref 3.5–5.2)
Sodium: 143 mmol/L (ref 134–144)
eGFR: 63 mL/min/{1.73_m2} (ref >59)

## 2021-07-13 MED ORDER — CYCLOBENZAPRINE HCL 5 MG PO TABS: 5.0000 mg | ORAL_TABLET | Freq: Three times a day (TID) | ORAL | 1 refills | Status: DC | PRN

## 2021-07-13 MED ORDER — METHYLPREDNISOLONE ACETATE 80 MG/ML IJ SUSP
80.0000 mg | Freq: Once | INTRAMUSCULAR | Status: AC
  Administered 2021-07-13: 16:00:00 80 mg via INTRAMUSCULAR

## 2021-07-13 NOTE — Progress Notes (Signed)
Acute Office Visit  Subjective:    Patient ID: Sandra Carroll, female    DOB: March 24, 1966, 56 y.o.   MRN: [id number]  Chief Complaint  Patient presents with   Hip Pain    Left   Groin Pain    Left    Hip Pain  The incident occurred 3 to 5 days ago. The incident occurred at home. There was no injury mechanism. The pain is present in the left hip and left leg (groin). The quality of the pain is described as aching. The pain is at a severity of 10/10. The pain is severe. The pain has been Constant since onset. Associated symptoms include an inability to bear weight. She reports no foreign bodies present. The symptoms are aggravated by movement and palpation. She has tried ice for the symptoms.  Groin Pain This is a new problem. The current episode started in the past 7 days. The problem occurs constantly. The problem has been unchanged. The pain is severe. The problem affects the left side. She is not pregnant. Pertinent negatives include no abdominal pain, anorexia, back pain, chills, diarrhea, fever, flank pain, nausea or rash.   Past Medical History:  Diagnosis Date   Acute pyelonephritis 05/07/2020   Anxiety    Chronic back pain    Complication of anesthesia 1991 and 1998   childbirth --reaction to anesthesia was itching.    DDD (degenerative disc disease)    Depression    Headache    migraines   Hemorrhoids    History of kidney stones    Hyperlipidemia    Hypertension    Iron deficiency anemia 07/20/2020   Kidney stone    PONV (postoperative nausea and vomiting)    Vitamin B12 deficiency 07/20/2020    Past Surgical History:  Procedure Laterality Date   [place]   pt had itching with 1991 c-section.    CHOLECYSTECTOMY     COLONOSCOPY N/A 03/01/2013   Dr. Gala Romney- normal rectum except for anal canal/internal hemorrhoidal tags. colonic mucosa appeared normal.   CYSTOSCOPY W/ URETERAL STENT PLACEMENT Left 12/09/2019   Procedure:  CYSTOSCOPY WITH RETROGRADE PYELOGRAM/URETERAL STENT PLACEMENT;  Surgeon: Irine Seal, MD;  Location: [facility];  Service: Urology;  Laterality: Left;   CYSTOSCOPY W/ URETERAL STENT PLACEMENT Right 05/07/2020   Procedure: CYSTOSCOPY WITH RETROGRADE PYELOGRAM/URETERAL STENT PLACEMENT;  Surgeon: Festus Aloe, MD;  Location: [facility];  Service: Urology;  Laterality: Right;   CYSTOSCOPY WITH RETROGRADE PYELOGRAM, URETEROSCOPY AND STENT PLACEMENT Right 06/02/2020   Procedure: CYSTOSCOPY WITH RETROGRADE PYELOGRAM, URETEROSCOPY, BASKET EXTRACTION OF STONES AND STENT EXCHANGE;  Surgeon: Cleon Gustin, MD;  Location: [facility];  Service: Urology;  Laterality: Right;   CYSTOSCOPY WITH RETROGRADE PYELOGRAM, URETEROSCOPY AND STENT PLACEMENT  03/11/2021   Procedure: CYSTOSCOPY WITH BILATERAL RETROGRADE PYELOGRAM, BILATERAL URETEROSCOPY AND RIGHT URETERAL STENT PLACEMENT;  Surgeon: Cleon Gustin, MD;  Location: [facility];  Service: Urology;;   CYSTOSCOPY/URETEROSCOPY/HOLMIUM LASER/STENT PLACEMENT Left 12/17/2019   Procedure: LEFT URETEROSCOPY/STENT EXCHANGE/ EXTRACTION OF LEFT URETERAL CALCULI;  Surgeon: Irine Seal, MD;  [facility];  Service: Urology;  Laterality: Left;   KNEE ARTHROSCOPY Right 2003   LITHOTRIPSY     LUMBAR LAMINECTOMY/DECOMPRESSION MICRODISCECTOMY  03/02/2012   Procedure: LUMBAR LAMINECTOMY/DECOMPRESSION MICRODISCECTOMY 1 LEVEL;  Surgeon: Winfield Cunas, MD;  Location: [place] [facility];  Service: Neurosurgery;  Laterality: Left;  LEFT Lumbar Five-Sacral One Laminectomy for Decompression    STONE EXTRACTION WITH BASKET  03/11/2021   Procedure: STONE EXTRACTION WITH BASKET BILATERAL;  Surgeon: Cleon Gustin, MD;  [facility];  Service: Urology;;    Family History  Problem Relation Age of Onset   Hypertension Mother    Cancer Father    Hypertension Father    Colon cancer Neg Hx     Social History   Socioeconomic History   Marital status: Single    Spouse name: Not on file    Number of children: Not on file   Years of education: Not on file   Highest education level: Not on file  Occupational History   Not on file  Tobacco Use   Smoking status: Former    Packs/day: 0.25    Years: 30.00    Pack years: 7.50    Types: Cigarettes    Quit date: 2010    Years since quitting: 13.0   Smokeless tobacco: Never  Vaping Use   Vaping Use: Never used  Substance and Sexual Activity   Alcohol use: Yes    Comment: rare   Drug use: Never   Sexual activity: Not Currently    Birth control/protection: Pill  Other Topics Concern   Not on file  Social History Narrative   ** Merged History Encounter **       Social Determinants of Health   Financial Resource Strain: Not on file  Food Insecurity: Not on file  Transportation Needs: Not on file  Physical Activity: Not on file  Stress: Not on file  Social Connections: Not on file  Intimate Partner Violence: Not on file    Outpatient Medications Prior to Visit  Medication Sig Dispense Refill   calcium carbonate (OS-CAL - DOSED IN MG OF ELEMENTAL CALCIUM) 1250 (500 Ca) MG tablet Take 1 tablet by mouth in the morning and at bedtime.     citalopram (CELEXA) 20 MG tablet Take 1 tablet (20 mg total) by mouth daily. 90 tablet 1   diclofenac (VOLTAREN) 75 MG EC tablet Take 1 tablet (75 mg total) by mouth 2 (two) times daily as needed. 60 tablet 2   lisinopril (ZESTRIL) 20 MG tablet Take 1 tablet (20 mg total) by mouth daily. 90 tablet 1   Potassium Citrate (UROCIT-K 15) 15 MEQ (1620 MG) TBCR Take 1 tablet by mouth 2 (two) times daily. 60 tablet 11   simvastatin (ZOCOR) 20 MG tablet Take 1 tablet (20 mg total) by mouth daily at 6 PM. 90 tablet 1   vitamin B-12 (CYANOCOBALAMIN) 1000 MCG tablet Take 1,000 mcg by mouth daily.     No facility-administered medications prior to visit.    No Known Allergies  Review of Systems  Constitutional:  Negative for chills and fever.  Eyes: Negative.   Gastrointestinal:  Negative  for abdominal pain, anorexia, diarrhea and nausea.  Genitourinary:  Negative for flank pain.  Musculoskeletal:  Positive for myalgias. Negative for back pain.  Skin: Negative.  Negative for rash.  All other systems reviewed and are negative.     Objective:    Physical Exam Vitals and nursing note reviewed.  Constitutional:      General: She is awake.     Appearance: Normal appearance. She is overweight.  HENT:     Mouth/Throat:     Mouth: Mucous membranes are moist.     Pharynx: Oropharynx is clear.  Cardiovascular:     Rate and Rhythm: Normal rate and regular rhythm.     Pulses: Normal pulses.     Heart sounds:  Normal heart sounds.  Pulmonary:     Effort: Pulmonary effort is normal.     Breath sounds: Normal breath sounds.  Abdominal:     General: Bowel sounds are normal.  Musculoskeletal:     Left hip: Tenderness present. Decreased range of motion.     Left lower leg: Tenderness present.       Legs:     Comments: Left/left groin pain  Skin:    General: Skin is warm.     Findings: No rash.  Neurological:     Mental Status: She is alert.  Psychiatric:        Behavior: Behavior is cooperative.    BP 130/81    Pulse 87    Ht 5' 2"  (1.575 m)    Wt 208 lb (94.3 kg)    LMP 10/17/2019 (Approximate)    SpO2 98%    BMI 38.04 kg/m  Wt Readings from Last 3 Encounters:  07/13/21 208 lb (94.3 kg)  06/28/21 208 lb (94.3 kg)  06/24/21 208 lb (94.3 kg)    Health Maintenance Due  Topic Date Due   COVID-19 Vaccine (3 - Booster for Moderna series) 02/24/2020    There are no preventive care reminders to display for this patient.   Lab Results  Component Value Date   TSH 0.553 10/28/2020   Lab Results  Component Value Date   WBC 7.3 06/24/2021   HGB 13.1 06/24/2021   HCT 38.3 06/24/2021   MCV 92 06/24/2021   PLT 312 06/24/2021   Lab Results  Component Value Date   NA 143 07/12/2021   K 5.3 (H) 07/12/2021   CO2 23 07/12/2021   GLUCOSE 95 07/12/2021   BUN 22  07/12/2021   CREATININE 1.06 (H) 07/12/2021   BILITOT <0.2 06/24/2021   ALKPHOS 138 (H) 06/24/2021   AST 21 06/24/2021   ALT 15 06/24/2021   PROT 7.3 06/24/2021   ALBUMIN 4.2 06/24/2021   CALCIUM 10.9 (H) 07/12/2021   ANIONGAP 6 05/09/2020   EGFR 63 07/12/2021   Lab Results  Component Value Date   CHOL 177 06/24/2021   Lab Results  Component Value Date   HDL 50 06/24/2021   Lab Results  Component Value Date   LDLCALC 98 06/24/2021   Lab Results  Component Value Date   TRIG 167 (H) 06/24/2021   Lab Results  Component Value Date   CHOLHDL 3.5 06/24/2021   No results found for: HGBA1C     Assessment & Plan:  -Take medication as prescribed -ice compress as tolerated -antiinflammatory as prescribed from initial visit  -flexeril 5 mg tablet by mouth as needed -DepoMedrol-80 shot given in  office. -[place] orders completed- results pending -follow up with unresolved symptoms Problem List Items Addressed This Visit       Other   Left groin pain - Primary   Relevant Medications   cyclobenzaprine (FLEXERIL) 5 MG tablet   Other Relevant Orders   [place] Lower Ext Art Left Ltd   Left hip pain   Relevant Orders   [place] Lower Ext Art Left Ltd     Meds ordered this encounter  Medications   cyclobenzaprine (FLEXERIL) 5 MG tablet    Sig: Take 1 tablet (5 mg total) by mouth 3 (three) times daily as needed for muscle spasms.    Dispense:  30 tablet    Refill:  1    Order Specific Question:   Supervising Provider    Answer:   Claretta Fraise [phone]  methylPREDNISolone acetate (DEPO-MEDROL) injection 80 mg     Ivy Lynn, NP

## 2021-07-13 NOTE — Patient Instructions (Signed)
Adductor Muscle Strain An adductor muscle strain, also called a groin strain or pull, is an injury to the muscles or tendons on the upper, inner part of the thigh. These muscles are called the adductor or groin muscles. They are responsible for moving the legs across the body or pulling the legs together. A muscle strain occurs when a muscle is overstretched and some muscle fibers are torn. The severity of an adductor muscle strain is rated as Grade 1, 2, or 3. A Grade 3 strain has the most tearing and pain. What are the causes? Adductor muscle strains usually occur during exercise or while participating in sports. This condition may be caused by: A sudden, violent force placed on the muscle, stretching it too far. Stretching the muscles too far or too suddenly, often during side-to-side motion with a sudden change in direction. Putting repeated stress on the adductor muscles over a long period of time. Performing vigorous activity without properly stretching or warming up the adductor muscles beforehand. Not being properly conditioned. What are the signs or symptoms? Symptoms of this condition include: Pain and tenderness in the groin area. This begins as sharp pain and persists as a dull ache. A popping or snapping feeling when the injury occurs (for severe strains). Swelling or bruising. Muscle spasms. Weakness in the leg. Stiffness in the groin area with decreased ability to move the affected muscles. How is this diagnosed? This condition may be diagnosed based on: A physical exam. Your medical history. How well you can do certain range of motion exercises. Imaging tests, such as MRI, ultrasound, or X-rays. Your strain may be rated based on how severe it is. The ratings are: Grade 1 strain (mild). Muscles are overstretched. There may be very small muscle tears. This type of strain generally heals in about one week. Grade 2 strain (moderate). Muscles are partially torn. This may take  one to two months to heal. Grade 3 strain (severe). Muscles are completely torn. A severe strain can take more than three months to heal. Grade 3 gluteal strains are rare. How is this treated? An adductor strain will often heal on its own. If needed, this condition may be treated with: PRICE therapy. PRICE stands for protection of the injured area, rest, ice, pressure (compression), and elevation. Medicines to help manage pain and swelling (anti-inflammatory medicines). Crutches. You may be directed to use these for the first few days to minimize your pain. Depending on the severity of the muscle strain, recovery time may vary from a few weeks to several months. Severe injuries often require 4-6 weeks for recovery. In those cases, complete healing can take 4-5 months. Follow these instructions at home: [place] the muscle from being injured again. Rest. Do not use the strained muscle if it causes pain. If directed, put ice on the injured area: Put ice in a plastic bag. Place a towel between your skin and the bag. Leave the ice on for 20 minutes, 2-3 times a day. Do this for the first 2 days after the injury. Apply compression by wrapping the injured area with an elastic bandage as told by your health care provider. Raise (elevate) the injured area above the level of your heart while you are sitting or lying down. Activity Do not drive or use heavy machinery while taking prescription pain medicine. Walk, stretch, and do exercises as told by your health care provider. Only do these activities if you can do so without any pain. General instructions Take  over-the-counter and prescription medicines only as told by your health care provider. Follow your treatment plan as told by your health care provider. This may include: Physical therapy. Massage. Local electrical stimulation (transcutaneous electrical nerve stimulation, TENS). Keep all follow-up visits. This is important. How  is this prevented? Warm up and stretch before being active. Cool down and stretch after being active. Give your body time to rest between periods of activity. Make sure to use equipment that fits you. Be safe and responsible while being active to avoid slips and falls. Maintain physical fitness, including: Proper conditioning in the adductor muscles. Overall strength, flexibility, and endurance. Contact a health care provider if: You have increased pain or swelling in the affected area. Your symptoms are not improving or they are getting worse. Summary An adductor muscle strain, also called a groin strain or pull, is an injury to the muscles or tendons on the upper, inner part of the thigh. A muscle strain occurs when a muscle is overstretched and some muscle fibers are torn. Depending on the severity of the muscle strain, recovery time may vary from a few weeks to several months. This information is not intended to replace advice given to you by your health care provider. Make sure you discuss any questions you have with your health care provider. Document Revised: 11/04/2020 Document Reviewed: 11/04/2020 Elsevier Patient Education  2022 [place]. Hip Pain The hip is the joint between the upper legs and the lower pelvis. The bones, cartilage, tendons, and muscles of your hip joint support your body and allow you to move around. Hip pain can range from a minor ache to severe pain in one or both of your hips. The pain may be felt on the inside of the hip joint near the groin, or on the outside near the buttocks and upper thigh. You may also have swelling or stiffness in your hip area. Follow these instructions at home: Managing pain, stiffness, and swelling   If directed, put ice on the painful area. To do this: Put ice in a plastic bag. Place a towel between your skin and the bag. Leave the ice on for 20 minutes, 2-3 times a day. If directed, apply heat to the affected area as often  as told by your health care provider. Use the heat source that your health care provider recommends, such as a moist heat pack or a heating pad. Place a towel between your skin and the heat source. Leave the heat on for 20-30 minutes. Remove the heat if your skin turns bright red. This is especially important if you are unable to feel pain, heat, or cold. You may have a greater risk of getting burned. Activity Do exercises as told by your health care provider. Avoid activities that cause pain. General instructions  Take over-the-counter and prescription medicines only as told by your health care provider. Keep a journal of your symptoms. Write down: How often you have hip pain. The location of your pain. What the pain feels like. What makes the pain worse. Sleep with a pillow between your legs on your most comfortable side. Keep all follow-up visits as told by your health care provider. This is important. Contact a health care provider if: You cannot put weight on your leg. Your pain or swelling continues or gets worse after one week. It gets harder to walk. You have a fever. Get help right away if: You fall. You have a sudden increase in pain and swelling in your hip.  Your hip is red or swollen or very tender to touch. Summary Hip pain can range from a minor ache to severe pain in one or both of your hips. The pain may be felt on the inside of the hip joint near the groin, or on the outside near the buttocks and upper thigh. Avoid activities that cause pain. Write down how often you have hip pain, the location of the pain, what makes it worse, and what it feels like. This information is not intended to replace advice given to you by your health care provider. Make sure you discuss any questions you have with your health care provider. Document Revised: 10/22/2018 Document Reviewed: 10/22/2018 Elsevier Patient Education  [place].

## 2021-07-14 ENCOUNTER — Ambulatory Visit (HOSPITAL_COMMUNITY)
Admission: RE | Admit: 2021-07-14 | Discharge: 2021-07-14 | Disposition: A | Discharge status: Home / Self Care | Payer: PRIVATE HEALTH INSURANCE | Source: Ambulatory Visit | Attending: Nurse Practitioner | Admitting: Nurse Practitioner

## 2021-07-14 ENCOUNTER — Ambulatory Visit: Payer: PRIVATE HEALTH INSURANCE

## 2021-07-14 ENCOUNTER — Other Ambulatory Visit: Payer: Self-pay

## 2021-07-14 DIAGNOSIS — R1032 Left lower quadrant pain: Secondary | ICD-10-CM | POA: Insufficient documentation

## 2021-07-14 DIAGNOSIS — M25552 Pain in left hip: Secondary | ICD-10-CM | POA: Insufficient documentation

## 2021-07-14 NOTE — Addendum Note (Signed)
Addended by: Daryll Drown on: 07/14/2021 09:03 AM   Modules accepted: Orders

## 2021-07-15 ENCOUNTER — Telehealth: Payer: Self-pay

## 2021-07-15 DIAGNOSIS — N201 Calculus of ureter: Secondary | ICD-10-CM

## 2021-07-15 MED ORDER — POTASSIUM CITRATE ER 15 MEQ (1620 MG) PO TBCR: 1.0000 | EXTENDED_RELEASE_TABLET | Freq: Every day | ORAL | 11 refills | Status: DC

## 2021-07-15 NOTE — Telephone Encounter (Signed)
Called patient. No answer. Left message to decreased medication as per Dr. Ronne Binning and to call back to schedule lab appt.

## 2021-07-15 NOTE — Telephone Encounter (Signed)
-----   Message from Malen Gauze, MD sent at 07/13/2021  9:25 AM EST ----- She needs to decrease Urocit K to daily and repeat BMP in 2 weeks ----- Message ----- From: Ferdinand Lango, RN Sent: 07/13/2021   8:09 AM EST To: Malen Gauze, MD  Please review

## 2021-07-19 ENCOUNTER — Encounter: Payer: Self-pay | Admitting: Nurse Practitioner

## 2021-07-19 ENCOUNTER — Ambulatory Visit
Admission: RE | Admit: 2021-07-19 | Discharge: 2021-07-19 | Disposition: A | Discharge status: Home / Self Care | Payer: PRIVATE HEALTH INSURANCE | Source: Ambulatory Visit | Attending: Family Medicine | Admitting: Family Medicine

## 2021-07-19 ENCOUNTER — Ambulatory Visit (INDEPENDENT_AMBULATORY_CARE_PROVIDER_SITE_OTHER): Payer: PRIVATE HEALTH INSURANCE | Admitting: Nurse Practitioner

## 2021-07-19 VITALS — HR 81 | Temp 98.4°F | Ht 62.0 in | Wt 206.0 lb

## 2021-07-19 DIAGNOSIS — Z1231 Encounter for screening mammogram for malignant neoplasm of breast: Secondary | ICD-10-CM

## 2021-07-19 DIAGNOSIS — R200 Anesthesia of skin: Secondary | ICD-10-CM

## 2021-07-19 MED ORDER — CYCLOBENZAPRINE HCL 7.5 MG PO TABS: 7.5000 mg | ORAL_TABLET | Freq: Three times a day (TID) | ORAL | 0 refills | Status: DC | PRN

## 2021-07-19 MED ORDER — GABAPENTIN 300 MG PO CAPS: 300.0000 mg | ORAL_CAPSULE | Freq: Three times a day (TID) | ORAL | 0 refills | Status: DC

## 2021-07-19 MED ORDER — GABAPENTIN 300 MG PO CAPS: 300.0000 mg | ORAL_CAPSULE | Freq: Three times a day (TID) | ORAL | 3 refills | Status: DC

## 2021-07-19 NOTE — Progress Notes (Signed)
Acute Office Visit  Subjective:    Patient ID: Sandra Carroll, female    DOB: Nov 07, 1965, 56 y.o.   MRN: [id number]  Chief Complaint  Patient presents with   left leg numbness    Leg Pain  The incident occurred more than 1 week ago. The incident occurred at home. There was no injury mechanism. The pain is present in the left leg, left thigh and left hip (left groin). The quality of the pain is described as aching and cramping. The pain is at a severity of 10/10. The pain is severe. The pain has been Constant since onset. Associated symptoms include numbness. Pertinent negatives include no tingling. She reports no foreign bodies present. Nothing aggravates the symptoms. She has tried NSAIDs, elevation and ice for the symptoms. The treatment provided no relief.    She reports recurrent left hip and groin pain. was not an injury that may have caused the pain. The pain started  one weeks ago and is staying constant. The pain does radiate left lower leg. The pain is described as aching and Numbness, is 10/10 in intensity, occurring constantly. Symptoms are worse in the: morning, mid-day, afternoon, evening  Aggravating factors: none Relieving factors: none.  She has tried application of ice, NSAIDs, and oral steroids with little relief.     Past Medical History:  Diagnosis Date   Acute pyelonephritis 05/07/2020   Anxiety    Chronic back pain    Complication of anesthesia 1991 and 1998   childbirth --reaction to anesthesia was itching.    DDD (degenerative disc disease)    Depression    Headache    migraines   Hemorrhoids    History of kidney stones    Hyperlipidemia    Hypertension    Iron deficiency anemia 07/20/2020   Kidney stone    PONV (postoperative nausea and vomiting)    Vitamin B12 deficiency 07/20/2020    Past Surgical History:  Procedure Laterality Date   [place]   pt had itching with 1991 c-section.    CHOLECYSTECTOMY      COLONOSCOPY N/A 03/01/2013   Dr. Gala Romney- normal rectum except for anal canal/internal hemorrhoidal tags. colonic mucosa appeared normal.   CYSTOSCOPY W/ URETERAL STENT PLACEMENT Left 12/09/2019   Procedure: CYSTOSCOPY WITH RETROGRADE PYELOGRAM/URETERAL STENT PLACEMENT;  Surgeon: Irine Seal, MD;  Location: [facility];  Service: Urology;  Laterality: Left;   CYSTOSCOPY W/ URETERAL STENT PLACEMENT Right 05/07/2020   Procedure: CYSTOSCOPY WITH RETROGRADE PYELOGRAM/URETERAL STENT PLACEMENT;  Surgeon: Festus Aloe, MD;  Location: [facility];  Service: Urology;  Laterality: Right;   CYSTOSCOPY WITH RETROGRADE PYELOGRAM, URETEROSCOPY AND STENT PLACEMENT Right 06/02/2020   Procedure: CYSTOSCOPY WITH RETROGRADE PYELOGRAM, URETEROSCOPY, BASKET EXTRACTION OF STONES AND STENT EXCHANGE;  Surgeon: Cleon Gustin, MD;  Location: [facility];  Service: Urology;  Laterality: Right;   CYSTOSCOPY WITH RETROGRADE PYELOGRAM, URETEROSCOPY AND STENT PLACEMENT  03/11/2021   Procedure: CYSTOSCOPY WITH BILATERAL RETROGRADE PYELOGRAM, BILATERAL URETEROSCOPY AND RIGHT URETERAL STENT PLACEMENT;  Surgeon: Cleon Gustin, MD;  Location: [facility];  Service: Urology;;   CYSTOSCOPY/URETEROSCOPY/HOLMIUM LASER/STENT PLACEMENT Left 12/17/2019   Procedure: LEFT URETEROSCOPY/STENT EXCHANGE/ EXTRACTION OF LEFT URETERAL CALCULI;  Surgeon: Irine Seal, MD;  Location: [facility];  Service: Urology;  Laterality: Left;   KNEE ARTHROSCOPY Right 2003   LITHOTRIPSY     LUMBAR LAMINECTOMY/DECOMPRESSION MICRODISCECTOMY  03/02/2012   Procedure: LUMBAR LAMINECTOMY/DECOMPRESSION MICRODISCECTOMY 1 LEVEL;  Surgeon: Winfield Cunas,  MD;  Location: [facility];  Service: Neurosurgery;  Laterality: Left;  LEFT Lumbar Five-Sacral One Laminectomy for Decompression    STONE EXTRACTION WITH BASKET  03/11/2021   Procedure: STONE EXTRACTION WITH BASKET BILATERAL;  Surgeon: Cleon Gustin, MD;  Location: [facility];  Service: Urology;;    Family History  Problem  Relation Age of Onset   Hypertension Mother    Cancer Father    Hypertension Father    Colon cancer Neg Hx     Social History   Socioeconomic History   Marital status: Single    Spouse name: Not on file   Number of children: Not on file   Years of education: Not on file   Highest education level: Not on file  Occupational History   Not on file  Tobacco Use   Smoking status: Former    Packs/day: 0.25    Years: 30.00    Pack years: 7.50    Types: Cigarettes    Quit date: 2010    Years since quitting: 13.0   Smokeless tobacco: Never  Vaping Use   Vaping Use: Never used  Substance and Sexual Activity   Alcohol use: Yes    Comment: rare   Drug use: Never   Sexual activity: Not Currently    Birth control/protection: Pill  Other Topics Concern   Not on file  Social History Narrative   ** Merged History Encounter **       Social Determinants of Health   Financial Resource Strain: Not on file  Food Insecurity: Not on file  Transportation Needs: Not on file  Physical Activity: Not on file  Stress: Not on file  Social Connections: Not on file  Intimate Partner Violence: Not on file    Outpatient Medications Prior to Visit  Medication Sig Dispense Refill   calcium carbonate (OS-CAL - DOSED IN MG OF ELEMENTAL CALCIUM) 1250 (500 Ca) MG tablet Take 1 tablet by mouth in the morning and at bedtime.     citalopram (CELEXA) 20 MG tablet Take 1 tablet (20 mg total) by mouth daily. 90 tablet 1   diclofenac (VOLTAREN) 75 MG EC tablet Take 1 tablet (75 mg total) by mouth 2 (two) times daily as needed. 60 tablet 2   lisinopril (ZESTRIL) 20 MG tablet Take 1 tablet (20 mg total) by mouth daily. 90 tablet 1   Potassium Citrate (UROCIT-K 15) 15 MEQ (1620 MG) TBCR Take 1 tablet by mouth daily. 60 tablet 11   simvastatin (ZOCOR) 20 MG tablet Take 1 tablet (20 mg total) by mouth daily at 6 PM. 90 tablet 1   cyclobenzaprine (FLEXERIL) 5 MG tablet Take 1 tablet (5 mg total) by mouth 3  (three) times daily as needed for muscle spasms. 30 tablet 1   vitamin B-12 (CYANOCOBALAMIN) 1000 MCG tablet Take 1,000 mcg by mouth daily.     No facility-administered medications prior to visit.    No Known Allergies  Review of Systems  Constitutional: Negative.   HENT: Negative.    Eyes: Negative.   Respiratory: Negative.    Gastrointestinal: Negative.   Musculoskeletal: Negative.   Skin: Negative.   Neurological:  Positive for numbness. Negative for tingling.  All other systems reviewed and are negative.     Objective:    Physical Exam Vitals and nursing note reviewed.  Constitutional:      Appearance: She is obese.  HENT:     Head: Normocephalic.     Nose: Nose normal.  Eyes:  Conjunctiva/sclera: Conjunctivae normal.  Cardiovascular:     Pulses: Normal pulses.     Heart sounds: Normal heart sounds.  Pulmonary:     Effort: Pulmonary effort is normal.     Breath sounds: Normal breath sounds.  Abdominal:     General: Bowel sounds are normal.  Musculoskeletal:     Left hip: Tenderness present. Decreased range of motion.     Left lower leg: Tenderness present.  Skin:    General: Skin is warm.     Findings: No rash.  Neurological:     Mental Status: She is alert and oriented to person, place, and time.  Psychiatric:        Attention and Perception: Attention and perception normal.        Mood and Affect: Mood normal.        Behavior: Behavior normal.    Pulse 81    Temp 98.4 F (36.9 C)    Ht 5' 2" (1.575 m)    Wt 206 lb (93.4 kg)    LMP 10/17/2019 (Approximate)    SpO2 94%    BMI 37.68 kg/m  Wt Readings from Last 3 Encounters:  07/19/21 206 lb (93.4 kg)  07/13/21 208 lb (94.3 kg)  06/28/21 208 lb (94.3 kg)    Health Maintenance Due  Topic Date Due   TETANUS/TDAP  Never done    There are no preventive care reminders to display for this patient.   Lab Results  Component Value Date   TSH 0.553 10/28/2020   Lab Results  Component Value Date    WBC 7.3 06/24/2021   HGB 13.1 06/24/2021   HCT 38.3 06/24/2021   MCV 92 06/24/2021   PLT 312 06/24/2021   Lab Results  Component Value Date   NA 143 07/12/2021   K 5.3 (H) 07/12/2021   CO2 23 07/12/2021   GLUCOSE 95 07/12/2021   BUN 22 07/12/2021   CREATININE 1.06 (H) 07/12/2021   BILITOT <0.2 06/24/2021   ALKPHOS 138 (H) 06/24/2021   AST 21 06/24/2021   ALT 15 06/24/2021   PROT 7.3 06/24/2021   ALBUMIN 4.2 06/24/2021   CALCIUM 10.9 (H) 07/12/2021   ANIONGAP 6 05/09/2020   EGFR 63 07/12/2021   Lab Results  Component Value Date   CHOL 177 06/24/2021   Lab Results  Component Value Date   HDL 50 06/24/2021   Lab Results  Component Value Date   LDLCALC 98 06/24/2021   Lab Results  Component Value Date   TRIG 167 (H) 06/24/2021   Lab Results  Component Value Date   CHOLHDL 3.5 06/24/2021   No results found for: HGBA1C     Assessment & Plan:  Patient continues to have uncontrolled left leg and groin pain.  -[place] of groin and hip x-ray negative for any abnormality -Increased Flexeril from 5 mg tablet by mouth to 7.5 mg tablet by mouth -continue Voltaren 75 mg tablet by mouth as prescribed -completed physical therapy referral -follow up with worsening unresolved symptoms  Problem List Items Addressed This Visit   None Visit Diagnoses     Leg numbness    -  Primary   Relevant Medications   cyclobenzaprine (FEXMID) 7.5 MG tablet   Other Relevant Orders   Ambulatory referral to Physical Therapy        Meds ordered this encounter  Medications   DISCONTD: gabapentin (NEURONTIN) 300 MG capsule    Sig: Take 1 capsule (300 mg total) by mouth 3 (three) times  daily.    Dispense:  90 capsule    Refill:  0    Order Specific Question:   Supervising Provider    Answer:   Claretta Fraise [[id number]]   gabapentin (NEURONTIN) 300 MG capsule    Sig: Take 1 capsule (300 mg total) by mouth 3 (three) times daily.    Dispense:  90 capsule    Refill:  3    Order Specific  Question:   Supervising Provider    Answer:   Claretta Fraise [[id number]]   cyclobenzaprine (FEXMID) 7.5 MG tablet    Sig: Take 1 tablet (7.5 mg total) by mouth 3 (three) times daily as needed for muscle spasms.    Dispense:  30 tablet    Refill:  0    Order Specific Question:   Supervising Provider    Answer:   Claretta Fraise [[id number]]     Ivy Lynn, NP

## 2021-07-19 NOTE — Patient Instructions (Signed)
Leg Cramps Leg cramps occur when one or more muscles tighten and a person has no control over it (involuntary muscle contraction). Muscle cramps are most common in the calf muscles of the leg. They can occur during exercise or at rest. Leg cramps are painful, and they may last for a few seconds to a few minutes. Cramps may return several times before they finallystop. Usually, leg cramps are not caused by a serious medical problem. In many cases, the cause is not known. Some common causes include: Excessive physical effort (overexertion), such as during intense exercise. Doing the same motion over and over. Staying in a certain position for a long period of time. Improper preparation, form, or technique while doing a sport or an activity. Dehydration. Injury. Side effects of certain medicines. Abnormally low levels of minerals in your blood (electrolytes), especially potassium and calcium. This could result from: Pregnancy. Taking diuretic medicines. Follow these instructions at home: Eating and drinking Drink enough fluid to keep your urine pale yellow. Staying hydrated may help prevent cramps. Eat a healthy diet that includes plenty of nutrients to help your muscles function. A healthy diet includes fruits and vegetables, lean protein, whole grains, and low-fat or nonfat dairy products. Managing pain, stiffness, and swelling     Try massaging, stretching, and relaxing the affected muscle. Do this for several minutes at a time. If directed, put ice on areas that are sore or painful after a cramp. To do this: Put ice in a plastic bag. Place a towel between your skin and the bag. Leave the ice on for 20 minutes, 2-3 times a day. Remove the ice if your skin turns bright red. This is very important. If you cannot feel pain, heat, or cold, you have a greater risk of damage to the area. If directed, apply heat to muscles that are tense or tight. Do this before you exercise, or as often as told  by your health care provider. Use the heat source that your health care provider recommends, such as a moist heat pack or a heating pad. To do this: Place a towel between your skin and the heat source. Leave the heat on for 20-30 minutes. Remove the heat if your skin turns bright red. This is especially important if you are unable to feel pain, heat, or cold. You may have a greater risk of getting burned. Try taking hot showers or baths to help relax tight muscles. General instructions If you are having frequent leg cramps, avoid intense exercise for several days. Take over-the-counter and prescription medicines only as told by your health care provider. Keep all follow-up visits. This is important. Contact a health care provider if: Your leg cramps get more severe or more frequent, or they do not improve over time. Your foot becomes cold, numb, or blue. Summary Muscle cramps can develop in any muscle, but the most common place is in the calf muscles of the leg. Leg cramps are painful, and they may last for a few seconds to a few minutes. Usually, leg cramps are not caused by a serious medical problem. Often, the cause is not known. Stay hydrated, and take over-the-counter and prescription medicines only as told by your health care provider. This information is not intended to replace advice given to you by your health care provider. Make sure you discuss any questions you have with your healthcare provider. Document Revised: 10/23/2019 Document Reviewed: 10/23/2019 Elsevier Patient Education  2022 [employer]  

## 2021-07-29 ENCOUNTER — Other Ambulatory Visit: Payer: PRIVATE HEALTH INSURANCE

## 2021-08-17 ENCOUNTER — Telehealth: Payer: Self-pay

## 2021-08-17 NOTE — Telephone Encounter (Signed)
Results given to patient and Dr. Ronne Binning instructions of taking medication daily.  Patient scheduled lab appt

## 2021-08-17 NOTE — Telephone Encounter (Signed)
Patient left a voicemail 08-17-2021  Need a call back for recent test results.  Call back;  [phone]  Thanks, Rosey Bath

## 2021-08-30 ENCOUNTER — Other Ambulatory Visit: Payer: PRIVATE HEALTH INSURANCE

## 2021-08-30 ENCOUNTER — Ambulatory Visit (INDEPENDENT_AMBULATORY_CARE_PROVIDER_SITE_OTHER): Payer: PRIVATE HEALTH INSURANCE | Admitting: Family Medicine

## 2021-08-30 ENCOUNTER — Encounter: Payer: Self-pay | Admitting: Family Medicine

## 2021-08-30 VITALS — Ht 62.0 in

## 2021-08-30 DIAGNOSIS — H1033 Unspecified acute conjunctivitis, bilateral: Secondary | ICD-10-CM

## 2021-08-30 MED ORDER — TOBRAMYCIN-DEXAMETHASONE 0.3-0.1 % OP SUSP: OPHTHALMIC | 0 refills | Status: DC

## 2021-08-30 NOTE — Progress Notes (Signed)
? ?Subjective:  ?Patient ID: Sandra Carroll, female    DOB: 07-17-1965  Age: 56 y.o. MRN: [id number] ? ?CC: Eye Problem (Bilateral dry and itchy eyes today, was draining previously) and Ear Drainage (Left ear popped and bleeding) ? ? ?HPI ?Sandra Carroll presents for eyes red and swollen 4-5 days. Itching OS. Yellow pus from both eyes last 2 days. Draining a lot.  ?Rinsed wax out of ears 3 days ago  and had a pop and bleeding since. Sounds at left ear feel like she's in a tunnel.  ? ?Depression screen [facility] 2/9 08/30/2021 07/19/2021 07/13/2021  ?Decreased Interest 0 0 0  ?Down, Depressed, Hopeless 0 0 0  ?PHQ - 2 Score 0 0 0  ?Altered sleeping - 0 0  ?Tired, decreased energy - 3 3  ?Change in appetite - 0 0  ?Feeling bad or failure about yourself  - 0 0  ?Trouble concentrating - 0 0  ?Moving slowly or fidgety/restless - 0 0  ?Suicidal thoughts - 0 0  ?PHQ-9 Score - 3 3  ?Difficult doing work/chores - Not difficult at all -  ?Some recent data might be hidden  ? ? ?History ?Sandra Carroll has a past medical history of Acute pyelonephritis (05/07/2020), Anxiety, Chronic back pain, Complication of anesthesia (1991 and 1998), DDD (degenerative disc disease), Depression, Headache, Hemorrhoids, History of kidney stones, Hyperlipidemia, Hypertension, Iron deficiency anemia (07/20/2020), Kidney stone, PONV (postoperative nausea and vomiting), and Vitamin B12 deficiency (07/20/2020).  ? ?She has a past surgical history that includes Adenoidectomy (1978); Knee arthroscopy (Right, 2003); Cesarean section (1991 1998); Lumbar laminectomy/decompression microdiscectomy (03/02/2012); Colonoscopy (N/A, 03/01/2013); Lithotripsy; Cystoscopy w/ ureteral stent placement (Left, 12/09/2019); Cystoscopy/ureteroscopy/holmium laser/stent placement (Left, 12/17/2019); Cholecystectomy; Cystoscopy w/ ureteral stent placement (Right, 05/07/2020); Cystoscopy with retrograde pyelogram, ureteroscopy and stent placement (Right, 06/02/2020); Cystoscopy with  retrograde pyelogram, ureteroscopy and stent placement (03/11/2021); and Stone extraction with basket (03/11/2021).  ? ?Sandra Carroll family history includes Cancer in Sandra Carroll father; Hypertension in Sandra Carroll father and mother.She reports that she quit smoking about 13 years ago. Sandra Carroll smoking use included cigarettes. She has a 7.50 pack-year smoking history. She has never used smokeless tobacco. She reports current alcohol use. She reports that she does not use drugs. ? ? ? ?ROS ?Review of Systems  ?Constitutional:  Negative for chills, diaphoresis and fever.  ?HENT:  Negative for congestion, ear pain, hearing loss, postnasal drip, rhinorrhea, sore throat and trouble swallowing.   ?Eyes:  Positive for pain, discharge, redness, itching and visual disturbance.  ?Respiratory:  Negative for cough.   ?Cardiovascular:  Negative for chest pain.  ?Genitourinary:  Positive for flank pain.  ? ?Objective:  ?Ht 5\' 2"  (1.575 m)   LMP 10/17/2019 (Approximate)   BMI 37.68 kg/m?  ? ?BP Readings from Last 3 Encounters:  ?07/13/21 130/81  ?06/28/21 114/71  ?06/24/21 124/80  ? ? ?Wt Readings from Last 3 Encounters:  ?07/19/21 206 lb (93.4 kg)  ?07/13/21 208 lb (94.3 kg)  ?06/28/21 208 lb (94.3 kg)  ? ? ? ?Physical Exam ?Constitutional:   ?   Appearance: She is well-developed.  ?HENT:  ?   Head: Normocephalic and atraumatic.  ?   Right Ear: Tympanic membrane and external ear normal. No decreased hearing noted.  ?   Left Ear: Tympanic membrane and external ear normal. No decreased hearing noted.  ?   Nose: Mucosal edema present.  ?   Right Sinus: No frontal sinus tenderness.  ?   Left Sinus: No frontal sinus tenderness.  ?  Mouth/Throat:  ?   Pharynx: No oropharyngeal exudate or posterior oropharyngeal erythema.  ?Eyes:  ?   General: Lids are normal. Vision grossly intact. Gaze aligned appropriately. No scleral icterus.    ?   Right eye: Discharge present.     ?   Left eye: Discharge present. ?   Extraocular Movements: Extraocular movements intact.  ?    Conjunctiva/sclera:  ?   Right eye: Right conjunctiva is injected. Exudate present.  ?   Left eye: Left conjunctiva is injected. Exudate present.  ?   Pupils: Pupils are equal, round, and reactive to light.  ?Neck:  ?   Meningeal: Brudzinski's sign absent.  ?Pulmonary:  ?   Effort: No respiratory distress.  ?   Breath sounds: Normal breath sounds.  ?Lymphadenopathy:  ?   Head:  ?   Right side of head: No preauricular adenopathy.  ?   Left side of head: No preauricular adenopathy.  ?   Cervical:  ?   Right cervical: No superficial cervical adenopathy. ?   Left cervical: No superficial cervical adenopathy.  ? ? ? ? ?Assessment & Plan:  ? ?There are no diagnoses linked to this encounter. ? ? ? ? ?I am having Sandra Carroll start on tobramycin-dexamethasone. I am also having Sandra Carroll maintain Sandra Carroll citalopram, lisinopril, simvastatin, diclofenac, calcium carbonate, Potassium Citrate, gabapentin, and cyclobenzaprine. ? ?Allergies as of 08/30/2021   ?No Known Allergies ?  ? ?  ?Medication List  ?  ? ?  ? Accurate as of August 30, 2021 12:26 PM. If you have any questions, ask your nurse or doctor.  ?  ?  ? ?  ? ?calcium carbonate 1250 (500 Ca) MG tablet ?Commonly known as: OS-CAL - dosed in mg of elemental calcium ?Take 1 tablet by mouth in the morning and at bedtime. ?  ?citalopram 20 MG tablet ?Commonly known as: CeleXA ?Take 1 tablet (20 mg total) by mouth daily. ?  ?cyclobenzaprine 7.5 MG tablet ?Commonly known as: FEXMID ?Take 1 tablet (7.5 mg total) by mouth 3 (three) times daily as needed for muscle spasms. ?  ?diclofenac 75 MG EC tablet ?Commonly known as: VOLTAREN ?Take 1 tablet (75 mg total) by mouth 2 (two) times daily as needed. ?  ?gabapentin 300 MG capsule ?Commonly known as: NEURONTIN ?Take 1 capsule (300 mg total) by mouth 3 (three) times daily. ?  ?lisinopril 20 MG tablet ?Commonly known as: ZESTRIL ?Take 1 tablet (20 mg total) by mouth daily. ?  ?Potassium Citrate 15 MEQ (1620 MG) Tbcr ?Commonly known as:  Urocit-K 15 ?Take 1 tablet by mouth daily. ?  ?simvastatin 20 MG tablet ?Commonly known as: ZOCOR ?Take 1 tablet (20 mg total) by mouth daily at 6 PM. ?  ?tobramycin-dexamethasone ophthalmic solution ?Commonly known as: TobraDex ?Apply 1 drop in affected eye(s) every 2 hours for two days. Then every 4 hours for 5 days. ?Started by: Mechele Claude, MD ?  ? ?  ? ? ? ?Follow-up: Return if symptoms worsen or fail to improve. ? ?Mechele Claude, M.D. ?

## 2021-08-31 ENCOUNTER — Other Ambulatory Visit: Payer: Self-pay | Admitting: Family Medicine

## 2021-08-31 ENCOUNTER — Telehealth: Payer: Self-pay | Admitting: Family Medicine

## 2021-08-31 MED ORDER — TOBRAMYCIN-DEXAMETHASONE 0.3-0.1 % OP SUSP: OPHTHALMIC | 0 refills | Status: DC

## 2021-08-31 NOTE — Telephone Encounter (Signed)
Off work for 2 days after starting the medication. Scrip sent to [facility] ?

## 2021-08-31 NOTE — Telephone Encounter (Signed)
[facility] did not have eye drops so she wanted them sent to [facility].  Patient aware rx has been re sent. 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  How many days does she need to be off work? Advise on work note.  ?

## 2021-09-01 ENCOUNTER — Encounter: Payer: Self-pay | Admitting: Family Medicine

## 2021-09-02 NOTE — Telephone Encounter (Signed)
Patient aware.

## 2021-09-17 ENCOUNTER — Ambulatory Visit (HOSPITAL_COMMUNITY)
Admission: RE | Admit: 2021-09-17 | Discharge: 2021-09-17 | Disposition: A | Discharge status: Home / Self Care | Payer: Self-pay | Source: Ambulatory Visit | Attending: Urology | Admitting: Urology

## 2021-09-17 DIAGNOSIS — N200 Calculus of kidney: Secondary | ICD-10-CM | POA: Insufficient documentation

## 2021-09-24 ENCOUNTER — Ambulatory Visit: Payer: PRIVATE HEALTH INSURANCE | Admitting: Urology

## 2021-09-27 ENCOUNTER — Ambulatory Visit: Payer: PRIVATE HEALTH INSURANCE | Admitting: Urology

## 2021-10-06 ENCOUNTER — Encounter: Payer: Self-pay | Admitting: Urology

## 2021-10-06 ENCOUNTER — Ambulatory Visit (INDEPENDENT_AMBULATORY_CARE_PROVIDER_SITE_OTHER): Payer: 59 | Admitting: Urology

## 2021-10-06 VITALS — BP 116/76 | HR 61

## 2021-10-06 DIAGNOSIS — M549 Dorsalgia, unspecified: Secondary | ICD-10-CM

## 2021-10-06 DIAGNOSIS — R200 Anesthesia of skin: Secondary | ICD-10-CM

## 2021-10-06 DIAGNOSIS — N200 Calculus of kidney: Secondary | ICD-10-CM

## 2021-10-06 LAB — URINALYSIS, ROUTINE W REFLEX MICROSCOPIC
Bilirubin, UA: NEGATIVE
Glucose, UA: NEGATIVE
Leukocytes,UA: NEGATIVE
Nitrite, UA: NEGATIVE
Specific Gravity, UA: >1.03 — ABNORMAL HIGH (ref 1.005–1.030)
Urobilinogen, Ur: 0.2 mg/dL (ref 0.2–1.0)
pH, UA: 5.5 (ref 5.0–7.5)

## 2021-10-06 LAB — MICROSCOPIC EXAMINATION
RBC, Urine: >30 /hpf — AB (ref 0–2)
Renal Epithel, UA: NONE SEEN /hpf

## 2021-10-06 MED ORDER — CYCLOBENZAPRINE HCL 7.5 MG PO TABS: 7.5000 mg | ORAL_TABLET | Freq: Two times a day (BID) | ORAL | 1 refills | Status: DC | PRN

## 2021-10-06 MED ORDER — SODIUM BICARBONATE 650 MG PO TABS: 650.0000 mg | ORAL_TABLET | Freq: Two times a day (BID) | ORAL | 11 refills | Status: DC

## 2021-10-06 NOTE — Patient Instructions (Signed)
Dietary Guidelines to Help Prevent Kidney Stones Kidney stones are deposits of minerals and salts that form inside your kidneys. Your risk of developing kidney stones may be greater depending on your diet, your lifestyle, the medicines you take, and whether you have certain medical conditions. Most people can lower their chances of developing kidney stones by following the instructions below. Your dietitian may give you more specific instructions depending on your overall health and the type of kidney stones you tend to develop. What are tips for following this plan? Reading food labels  Choose foods with "no salt added" or "low-salt" labels. Limit your salt (sodium) intake to less than 1,500 mg a day. Choose foods with calcium for each meal and snack. Try to eat about 300 mg of calcium at each meal. Foods that contain 200-500 mg of calcium a serving include: 8 oz (237 mL) of milk, calcium-fortifiednon-dairy milk, and calcium-fortifiedfruit juice. Calcium-fortified means that calcium has been added to these drinks. 8 oz (237 mL) of kefir, yogurt, and soy yogurt. 4 oz (114 g) of tofu. 1 oz (28 g) of cheese. 1 cup (150 g) of dried figs. 1 cup (91 g) of cooked broccoli. One 3 oz (85 g) can of sardines or mackerel. Most people need 1,000-1,500 mg of calcium a day. Talk to your dietitian about how much calcium is recommended for you. Shopping Buy plenty of fresh fruits and vegetables. Most people do not need to avoid fruits and vegetables, even if these foods contain nutrients that may contribute to kidney stones. When shopping for convenience foods, choose: Whole pieces of fruit. Pre-made salads with dressing on the side. Low-fat fruit and yogurt smoothies. Avoid buying frozen meals or prepared deli foods. These can be high in sodium. Look for foods with live cultures, such as yogurt and kefir. Choose high-fiber grains, such as whole-wheat breads, oat bran, and wheat cereals. Cooking Do not add  salt to food when cooking. Place a salt shaker on the table and allow each person to add his or her own salt to taste. Use vegetable protein, such as beans, textured vegetable protein (TVP), or tofu, instead of meat in pasta, casseroles, and soups. Meal planning Eat less salt, if told by your dietitian. To do this: Avoid eating processed or pre-made food. Avoid eating fast food. Eat less animal protein, including cheese, meat, poultry, or fish, if told by your dietitian. To do this: Limit the number of times you have meat, poultry, fish, or cheese each week. Eat a diet free of meat at least 2 days a week. Eat only one serving each day of meat, poultry, fish, or seafood. When you prepare animal protein, cut pieces into small portion sizes. For most meat and fish, one serving is about the size of the palm of your hand. Eat at least five servings of fresh fruits and vegetables each day. To do this: Keep fruits and vegetables on hand for snacks. Eat one piece of fruit or a handful of berries with breakfast. Have a salad and fruit at lunch. Have two kinds of vegetables at dinner. Limit foods that are high in a substance called oxalate. These include: Spinach (cooked), rhubarb, beets, sweet potatoes, and Swiss chard. Peanuts. Potato chips, french fries, and baked potatoes with skin on. Nuts and nut products. Chocolate. If you regularly take a diuretic medicine, make sure to eat at least 1 or 2 servings of fruits or vegetables that are high in potassium each day. These include: Avocado. Banana. Orange, prune,   carrot, or tomato juice. Baked potato. Cabbage. Beans and split peas. Lifestyle  Drink enough fluid to keep your urine pale yellow. This is the most important thing you can do. Spread your fluid intake throughout the day. If you drink alcohol: Limit how much you use to: 0-1 drink a day for women who are not pregnant. 0-2 drinks a day for men. Be aware of how much alcohol is in your  drink. In the [place] one drink equals one 12 oz bottle of beer (355 mL), one 5 oz glass of wine (148 mL), or one 1 oz glass of hard liquor (44 mL). Lose weight if told by your health care provider. Work with your dietitian to find an eating plan and weight loss strategies that work best for you. General information Talk to your health care provider and dietitian about taking daily supplements. You may be told the following depending on your health and the cause of your kidney stones: Not to take supplements with vitamin C. To take a calcium supplement. To take a daily probiotic supplement. To take other supplements such as magnesium, fish oil, or vitamin B6. Take over-the-counter and prescription medicines only as told by your health care provider. These include supplements. What foods should I limit? Limit your intake of the following foods, or eat them as told by your dietitian. Vegetables Spinach. Rhubarb. Beets. Canned vegetables. Pickles. Olives. Baked potatoes with skin. Grains Wheat bran. Baked goods. Salted crackers. Cereals high in sugar. Meats and other proteins Nuts. Nut butters. Large portions of meat, poultry, or fish. Salted, precooked, or cured meats, such as sausages, meat loaves, and hot dogs. Dairy Cheese. Beverages Regular soft drinks. Regular vegetable juice. Seasonings and condiments Seasoning blends with salt. Salad dressings. Soy sauce. Ketchup. Barbecue sauce. Other foods Canned soups. Canned pasta sauce. Casseroles. Pizza. Lasagna. Frozen meals. Potato chips. French fries. The items listed above may not be a complete list of foods and beverages you should limit. Contact a dietitian for more information. What foods should I avoid? Talk to your dietitian about specific foods you should avoid based on the type of kidney stones you have and your overall health. Fruits Grapefruit. The item listed above may not be a complete list of foods and beverages you should  avoid. Contact a dietitian for more information. Summary Kidney stones are deposits of minerals and salts that form inside your kidneys. You can lower your risk of kidney stones by making changes to your diet. The most important thing you can do is drink enough fluid. Drink enough fluid to keep your urine pale yellow. Talk to your dietitian about how much calcium you should have each day, and eat less salt and animal protein as told by your dietitian. This information is not intended to replace advice given to you by your health care provider. Make sure you discuss any questions you have with your health care provider. Document Revised: 02/15/2021 Document Reviewed: 02/15/2021 Elsevier Patient Education  2023 Elsevier Inc.  

## 2021-10-06 NOTE — Progress Notes (Signed)
? ?10/06/2021 ?3:18 PM  ? ?Sandra Carroll ?[birth year] ?[id number] ? ?Referring provider: Loman Brooklyn, FNP ?[id number] [place],  [place] [address] ? ?nephrolithiasis ? ? ?HPI: ?Sandra Carroll is a 56yo here for followup for nephrolithiasis. No stone events since last visit. Renal US 09/20/2021 shows a right 52mm lower pole calculus. She drinks 30-40oz of water daily. She stopped UrocitK due to hyperkalemia. She has mild to moderate dull constant left back/flank pain. No other complaints today.  ? ? ?PMH: ?Past Medical History:  ?Diagnosis Date  ? Acute pyelonephritis 05/07/2020  ? Anxiety   ? Chronic back pain   ? Complication of anesthesia 1991 and 1998  ? childbirth --reaction to anesthesia was itching.   ? DDD (degenerative disc disease)   ? Depression   ? Headache   ? migraines  ? Hemorrhoids   ? History of kidney stones   ? Hyperlipidemia   ? Hypertension   ? Iron deficiency anemia 07/20/2020  ? Kidney stone   ? PONV (postoperative nausea and vomiting)   ? Vitamin B12 deficiency 07/20/2020  ? ? ?Surgical History: ?Past Surgical History:  ?Procedure Laterality Date  ? ADENOIDECTOMY  1978  ? [place]  ? pt had itching with 1991 c-section.   ? CHOLECYSTECTOMY    ? COLONOSCOPY N/A 03/01/2013  ? Dr. Gala Romney- normal rectum except for anal canal/internal hemorrhoidal tags. colonic mucosa appeared normal.  ? CYSTOSCOPY W/ URETERAL STENT PLACEMENT Left 12/09/2019  ? Procedure: CYSTOSCOPY WITH RETROGRADE PYELOGRAM/URETERAL STENT PLACEMENT;  Surgeon: Irine Seal, MD;  Location: [facility];  Service: Urology;  Laterality: Left;  ? CYSTOSCOPY W/ URETERAL STENT PLACEMENT Right 05/07/2020  ? Procedure: CYSTOSCOPY WITH RETROGRADE PYELOGRAM/URETERAL STENT PLACEMENT;  Surgeon: Festus Aloe, MD;  Location: [facility];  Service: Urology;  Laterality: Right;  ? CYSTOSCOPY WITH RETROGRADE PYELOGRAM, URETEROSCOPY AND STENT PLACEMENT Right 06/02/2020  ? Procedure: CYSTOSCOPY WITH RETROGRADE PYELOGRAM, URETEROSCOPY, BASKET  EXTRACTION OF STONES AND STENT EXCHANGE;  Surgeon: Cleon Gustin, MD;  Location: [facility];  Service: Urology;  Laterality: Right;  ? CYSTOSCOPY WITH RETROGRADE PYELOGRAM, URETEROSCOPY AND STENT PLACEMENT  03/11/2021  ? Procedure: CYSTOSCOPY WITH BILATERAL RETROGRADE PYELOGRAM, BILATERAL URETEROSCOPY AND RIGHT URETERAL STENT PLACEMENT;  Surgeon: Cleon Gustin, MD;  Location: [facility];  Service: Urology;;  ? CYSTOSCOPY/URETEROSCOPY/HOLMIUM LASER/STENT PLACEMENT Left 12/17/2019  ? Procedure: LEFT URETEROSCOPY/STENT EXCHANGE/ EXTRACTION OF LEFT URETERAL CALCULI;  Surgeon: Irine Seal, MD;  Location: [facility];  Service: Urology;  Laterality: Left;  ? KNEE ARTHROSCOPY Right 2003  ? LITHOTRIPSY    ? LUMBAR LAMINECTOMY/DECOMPRESSION MICRODISCECTOMY  03/02/2012  ? Procedure: LUMBAR LAMINECTOMY/DECOMPRESSION MICRODISCECTOMY 1 LEVEL;  Surgeon: Winfield Cunas, MD;  Location: [facility];  Service: Neurosurgery;  Laterality: Left;  LEFT Lumbar Five-Sacral One Laminectomy for Decompression   ? STONE EXTRACTION WITH BASKET  03/11/2021  ? Procedure: STONE EXTRACTION WITH BASKET BILATERAL;  Surgeon: Cleon Gustin, MD;  Location: [facility];  Service: Urology;;  ? ? ?Home Medications:  ?Allergies as of 10/06/2021   ?No Known Allergies ?  ? ?  ?Medication List  ?  ? ?  ? Accurate as of October 06, 2021  3:18 PM. If you have any questions, ask your nurse or doctor.  ?  ?  ? ?  ? ?calcium carbonate 1250 (500 Ca) MG tablet ?Commonly known as: OS-CAL - dosed in mg of elemental calcium ?Take 1 tablet by mouth in the morning and at bedtime. ?  ?citalopram 20  MG tablet ?Commonly known as: CeleXA ?Take 1 tablet (20 mg total) by mouth daily. ?  ?cyclobenzaprine 7.5 MG tablet ?Commonly known as: FEXMID ?Take 1 tablet (7.5 mg total) by mouth 3 (three) times daily as needed for muscle spasms. ?  ?diclofenac 75 MG EC tablet ?Commonly known as: VOLTAREN ?Take 1 tablet (75 mg total) by mouth 2 (two) times daily as needed. ?  ?gabapentin 300 MG  capsule ?Commonly known as: NEURONTIN ?Take 1 capsule (300 mg total) by mouth 3 (three) times daily. ?  ?lisinopril 20 MG tablet ?Commonly known as: ZESTRIL ?Take 1 tablet (20 mg total) by mouth daily. ?  ?Potassium Citrate 15 MEQ (1620 MG) Tbcr ?Commonly known as: Urocit-K 15 ?Take 1 tablet by mouth daily. ?  ?simvastatin 20 MG tablet ?Commonly known as: ZOCOR ?Take 1 tablet (20 mg total) by mouth daily at 6 PM. ?  ?tobramycin-dexamethasone ophthalmic solution ?Commonly known as: TobraDex ?Apply 1 drop in affected eye(s) every 2 hours for two days. Then every 4 hours for 5 days. ?  ? ?  ? ? ?Allergies: No Known Allergies ? ?Family History: ?Family History  ?Problem Relation Age of Onset  ? Hypertension Mother   ? Cancer Father   ? Hypertension Father   ? Colon cancer Neg Hx   ? ? ?Social History:  reports that she quit smoking about 13 years ago. Her smoking use included cigarettes. She has a 7.50 pack-year smoking history. She has never used smokeless tobacco. She reports current alcohol use. She reports that she does not use drugs. ? ?ROS: ?All other review of systems were reviewed and are negative except what is noted above in HPI ? ?Physical Exam: ?BP 116/76   Pulse 61   LMP 10/17/2019 (Approximate)   ?Constitutional:  Alert and oriented, No acute distress. ?HEENT: [place] AT, moist mucus membranes.  Trachea midline, no masses. ?Cardiovascular: No clubbing, cyanosis, or edema. ?Respiratory: Normal respiratory effort, no increased work of breathing. ?GI: Abdomen is soft, nontender, nondistended, no abdominal masses ?GU: No CVA tenderness.  ?Lymph: No cervical or inguinal lymphadenopathy. ?Skin: No rashes, bruises or suspicious lesions. ?Neurologic: Grossly intact, no focal deficits, moving all 4 extremities. ?Psychiatric: Normal mood and affect. ? ?Laboratory Data: ?Lab Results  ?Component Value Date  ? WBC 7.3 06/24/2021  ? HGB 13.1 06/24/2021  ? HCT 38.3 06/24/2021  ? MCV 92 06/24/2021  ? PLT 312 06/24/2021   ? ? ?Lab Results  ?Component Value Date  ? CREATININE 1.06 (H) 07/12/2021  ? ? ?No results found for: PSA ? ?No results found for: TESTOSTERONE ? ?No results found for: HGBA1C ? ?Urinalysis ?   ?Component Value Date/Time  ? [place] YELLOW 05/07/2020 1233  ? APPEARANCEUR Clear 06/28/2021 1451  ? LABSPEC 1.013 05/07/2020 1233  ? PHURINE 6.0 05/07/2020 1233  ? GLUCOSEU Negative 06/28/2021 1451  ? HGBUR SMALL (A) 05/07/2020 1233  ? BILIRUBINUR Negative 06/28/2021 1451  ? [place] NEGATIVE 05/07/2020 1233  ? PROTEINUR Negative 06/28/2021 1451  ? PROTEINUR 100 (A) 05/07/2020 1233  ? UROBILINOGEN 0.2 07/25/2014 0215  ? NITRITE Negative 06/28/2021 1451  ? NITRITE NEGATIVE 05/07/2020 1233  ? LEUKOCYTESUR Trace (A) 06/28/2021 1451  ? LEUKOCYTESUR LARGE (A) 05/07/2020 1233  ? ? ?Lab Results  ?Component Value Date  ? LABMICR Comment 06/28/2021  ? [place] None seen 03/17/2021  ? LABEPIT None seen 03/17/2021  ? MUCUS Present 06/10/2020  ? BACTERIA Few (A) 03/17/2021  ? ? ?Pertinent Imaging: ?Renal US 09/20/2021: Images reviewed and  discussed with the patient  ?Results for orders placed during the hospital encounter of 12/31/20 ? ?Abdomen 1 view (KUB) ? ?Narrative ?CLINICAL DATA:  Nephrolithiasis. ? ?EXAM: ?ABDOMEN - 1 VIEW ? ?COMPARISON:  None. ? ?FINDINGS: ?Multiple calcifications in the pelvis. Both kidneys are largely ?obscured by bowel contents. No definite renal stones are seen on ?this study. No ureteral stones identified. No other abnormalities. ? ?IMPRESSION: ?The kidneys are largely obscured by bowel contents but no stones are ?seen. ? ?Calcifications in the pelvis are nonspecific but may all be ?phleboliths. No other evidence of ureteral stone. ? ? ?Electronically Signed ?By: Dorise Bullion III M.D ?On: 01/03/2021 19:43 ? ?No results found for this or any previous visit. ? ?No results found for this or any previous visit. ? ?No results found for this or any previous visit. ? ?Results for orders placed during the  hospital encounter of 09/17/21 ? ?Ultrasound renal complete ? ?Narrative ?CLINICAL DATA:  Follow-up nephrolithiasis ? ?EXAM: ?RENAL / URINARY TRACT ULTRASOUND COMPLETE ? ?COMPARISON:  05/10/2021 ? ?FINDINGS: ?Right K

## 2021-12-03 ENCOUNTER — Encounter: Payer: Self-pay | Admitting: Family Medicine

## 2021-12-03 ENCOUNTER — Ambulatory Visit (INDEPENDENT_AMBULATORY_CARE_PROVIDER_SITE_OTHER): Payer: Self-pay | Admitting: Family Medicine

## 2021-12-03 DIAGNOSIS — R42 Dizziness and giddiness: Secondary | ICD-10-CM | POA: Diagnosis not present

## 2021-12-03 MED ORDER — MECLIZINE HCL 25 MG PO TABS: 25.0000 mg | ORAL_TABLET | Freq: Three times a day (TID) | ORAL | 0 refills | Status: DC | PRN

## 2021-12-03 NOTE — Progress Notes (Signed)
Virtual Visit via telephone Note Due to COVID-19 pandemic this visit was conducted virtually. This visit type was conducted due to national recommendations for restrictions regarding the COVID-19 Pandemic (e.g. social distancing, sheltering in place) in an effort to limit this patient's exposure and mitigate transmission in our community. All issues noted in this document were discussed and addressed.  A physical exam was not performed with this format.   I connected with Sandra Carroll on 12/03/2021 at 1255 by telephone and verified that I am speaking with the correct person using two identifiers. Sandra Carroll is currently located at home and patient is currently with them during visit. The provider, Kari Baars, FNP is located in their office at time of visit.  I discussed the limitations, risks, security and privacy concerns of performing an evaluation and management service by virtual visit and the availability of in person appointments. I also discussed with the patient that there may be a patient responsible charge related to this service. The patient expressed understanding and agreed to proceed.  Subjective:  Patient ID: Sandra Carroll, female    DOB: 05-16-1966, 56 y.o.   MRN: [id number]  Chief Complaint:  Dizziness   HPI: Sandra Carroll is a 56 y.o. female presenting on 12/03/2021 for Dizziness   Pt reports intermittent feelings of dizziness and nausea. States this happens with certain movements and when turning her head from side to side. She denies unilateral weakness, visual changes, slurred speech, weakness, or confusion. States ongoing for the last 2 days.   Dizziness This is a new problem. The current episode started in the past 7 days. The problem occurs intermittently. The problem has been waxing and waning. Associated symptoms include nausea and vertigo. Pertinent negatives include no abdominal pain, anorexia, arthralgias, change in bowel habit, chest pain, chills,  congestion, coughing, diaphoresis, fatigue, fever, headaches, joint swelling, myalgias, neck pain, numbness, rash, sore throat, swollen glands, urinary symptoms, visual change, vomiting or weakness. The symptoms are aggravated by standing, twisting and walking. She has tried drinking and lying down for the symptoms. The treatment provided mild relief.     Relevant past medical, surgical, family, and social history reviewed and updated as indicated.  Allergies and medications reviewed and updated.   Past Medical History:  Diagnosis Date   Acute pyelonephritis 05/07/2020   Anxiety    Chronic back pain    Complication of anesthesia 1991 and 1998   childbirth --reaction to anesthesia was itching.    DDD (degenerative disc disease)    Depression    Headache    migraines   Hemorrhoids    History of kidney stones    Hyperlipidemia    Hypertension    Iron deficiency anemia 07/20/2020   Kidney stone    PONV (postoperative nausea and vomiting)    Vitamin B12 deficiency 07/20/2020    Past Surgical History:  Procedure Laterality Date   ADENOIDECTOMY  1978   CESAREAN SECTION  1991 1998   pt had itching with 1991 c-section.    CHOLECYSTECTOMY     COLONOSCOPY N/A 03/01/2013   Dr. Jena Gauss- normal rectum except for anal canal/internal hemorrhoidal tags. colonic mucosa appeared normal.   CYSTOSCOPY W/ URETERAL STENT PLACEMENT Left 12/09/2019   Procedure: CYSTOSCOPY WITH RETROGRADE PYELOGRAM/URETERAL STENT PLACEMENT;  Surgeon: Bjorn Pippin, MD;  Location: [facility];  Service: Urology;  Laterality: Left;   CYSTOSCOPY W/ URETERAL STENT PLACEMENT Right 05/07/2020   Procedure: CYSTOSCOPY WITH RETROGRADE PYELOGRAM/URETERAL STENT PLACEMENT;  Surgeon: Jerilee Field,  MD;  Location: [facility];  Service: Urology;  Laterality: Right;   CYSTOSCOPY WITH RETROGRADE PYELOGRAM, URETEROSCOPY AND STENT PLACEMENT Right 06/02/2020   Procedure: CYSTOSCOPY WITH RETROGRADE PYELOGRAM, URETEROSCOPY, BASKET EXTRACTION OF  STONES AND STENT EXCHANGE;  Surgeon: Malen Gauze, MD;  Location: [facility];  Service: Urology;  Laterality: Right;   CYSTOSCOPY WITH RETROGRADE PYELOGRAM, URETEROSCOPY AND STENT PLACEMENT  03/11/2021   Procedure: CYSTOSCOPY WITH BILATERAL RETROGRADE PYELOGRAM, BILATERAL URETEROSCOPY AND RIGHT URETERAL STENT PLACEMENT;  Surgeon: Malen Gauze, MD;  Location: [facility];  Service: Urology;;   CYSTOSCOPY/URETEROSCOPY/HOLMIUM LASER/STENT PLACEMENT Left 12/17/2019   Procedure: LEFT URETEROSCOPY/STENT EXCHANGE/ EXTRACTION OF LEFT URETERAL CALCULI;  Surgeon: Bjorn Pippin, MD;  Location: [facility];  Service: Urology;  Laterality: Left;   KNEE ARTHROSCOPY Right 2003   LITHOTRIPSY     LUMBAR LAMINECTOMY/DECOMPRESSION MICRODISCECTOMY  03/02/2012   Procedure: LUMBAR LAMINECTOMY/DECOMPRESSION MICRODISCECTOMY 1 LEVEL;  Surgeon: Carmela Hurt, MD;  Location: [facility];  Service: Neurosurgery;  Laterality: Left;  LEFT Lumbar Five-Sacral One Laminectomy for Decompression    STONE EXTRACTION WITH BASKET  03/11/2021   Procedure: STONE EXTRACTION WITH BASKET BILATERAL;  Surgeon: Malen Gauze, MD;  Location: [facility];  Service: Urology;;    Social History   Socioeconomic History   Marital status: Single    Spouse name: Not on file   Number of children: Not on file   Years of education: Not on file   Highest education level: Not on file  Occupational History   Not on file  Tobacco Use   Smoking status: Former    Packs/day: 0.25    Years: 30.00    Total pack years: 7.50    Types: Cigarettes    Quit date: 2010    Years since quitting: 13.4   Smokeless tobacco: Never  Vaping Use   Vaping Use: Never used  Substance and Sexual Activity   Alcohol use: Yes    Comment: rare   Drug use: Never   Sexual activity: Not Currently    Birth control/protection: Pill  Other Topics Concern   Not on file  Social History Narrative   ** Merged History Encounter **       Social Determinants of Health    Financial Resource Strain: Not on file  Food Insecurity: Not on file  Transportation Needs: Not on file  Physical Activity: Not on file  Stress: Not on file  Social Connections: Not on file  Intimate Partner Violence: Not on file    Outpatient Encounter Medications as of 12/03/2021  Medication Sig   meclizine (ANTIVERT) 25 MG tablet Take 1 tablet (25 mg total) by mouth 3 (three) times daily as needed for dizziness.   calcium carbonate (OS-CAL - DOSED IN MG OF ELEMENTAL CALCIUM) 1250 (500 Ca) MG tablet Take 1 tablet by mouth in the morning and at bedtime.   citalopram (CELEXA) 20 MG tablet Take 1 tablet (20 mg total) by mouth daily.   cyclobenzaprine (FEXMID) 7.5 MG tablet Take 1 tablet (7.5 mg total) by mouth 2 (two) times daily as needed for muscle spasms.   diclofenac (VOLTAREN) 75 MG EC tablet Take 1 tablet (75 mg total) by mouth 2 (two) times daily as needed.   gabapentin (NEURONTIN) 300 MG capsule Take 1 capsule (300 mg total) by mouth 3 (three) times daily.   lisinopril (ZESTRIL) 20 MG tablet Take 1 tablet (20 mg total) by mouth daily.   Potassium Citrate (UROCIT-K 15) 15 MEQ (1620 MG) TBCR Take 1  tablet by mouth daily.   simvastatin (ZOCOR) 20 MG tablet Take 1 tablet (20 mg total) by mouth daily at 6 PM.   sodium bicarbonate 650 MG tablet Take 1 tablet (650 mg total) by mouth 2 (two) times daily.   tobramycin-dexamethasone (TOBRADEX) ophthalmic solution Apply 1 drop in affected eye(s) every 2 hours for two days. Then every 4 hours for 5 days.   No facility-administered encounter medications on file as of 12/03/2021.    No Known Allergies  Review of Systems  Constitutional:  Negative for activity change, appetite change, chills, diaphoresis, fatigue, fever and unexpected weight change.  HENT:  Negative for congestion and sore throat.   Eyes:  Negative for photophobia and visual disturbance.  Respiratory:  Negative for cough and shortness of breath.   Cardiovascular:  Negative  for chest pain, palpitations and leg swelling.  Gastrointestinal:  Positive for nausea. Negative for abdominal pain, anorexia, change in bowel habit and vomiting.  Genitourinary:  Negative for decreased urine volume and difficulty urinating.  Musculoskeletal:  Negative for arthralgias, joint swelling, myalgias and neck pain.  Skin:  Negative for rash.  Neurological:  Positive for dizziness and vertigo. Negative for tremors, seizures, syncope, facial asymmetry, speech difficulty, weakness, light-headedness, numbness and headaches.  Psychiatric/Behavioral:  Negative for agitation, behavioral problems, confusion, decreased concentration, dysphoric mood, hallucinations, self-injury, sleep disturbance and suicidal ideas. The patient is not nervous/anxious and is not hyperactive.   All other systems reviewed and are negative.        Observations/Objective: No vital signs or physical exam, this was a virtual health encounter.  Pt alert and oriented, answers all questions appropriately, and able to speak in full sentences.    Assessment and Plan: Sandra Carroll was seen today for dizziness.  Diagnoses and all orders for this visit:  Vertigo Reported symptoms consistent with vertigo. No reported symptoms concerning for acute neurovascular event. Will trial meclizine to see if beneficial. Red flags discussed with pt. Pt aware to report any new, worsening, or persistent symptoms.  -     meclizine (ANTIVERT) 25 MG tablet; Take 1 tablet (25 mg total) by mouth 3 (three) times daily as needed for dizziness.     Follow Up Instructions: Return if symptoms worsen or fail to improve.    I discussed the assessment and treatment plan with the patient. The patient was provided an opportunity to ask questions and all were answered. The patient agreed with the plan and demonstrated an understanding of the instructions.   The patient was advised to call back or seek an in-person evaluation if the symptoms worsen  or if the condition fails to improve as anticipated.  The above assessment and management plan was discussed with the patient. The patient verbalized understanding of and has agreed to the management plan. Patient is aware to call the clinic if they develop any new symptoms or if symptoms persist or worsen. Patient is aware when to return to the clinic for a follow-up visit. Patient educated on when it is appropriate to go to the emergency department.    I provided 15 minutes of time during this telephone encounter.   Kari Baars, FNP-C [facility] [address] [place], [place] [address] [phone] 12/03/2021

## 2021-12-23 ENCOUNTER — Ambulatory Visit: Payer: PRIVATE HEALTH INSURANCE | Admitting: Family Medicine

## 2022-01-05 ENCOUNTER — Ambulatory Visit: Payer: PRIVATE HEALTH INSURANCE | Admitting: Urology

## 2022-01-12 ENCOUNTER — Ambulatory Visit (HOSPITAL_COMMUNITY): Payer: 59

## 2022-01-13 ENCOUNTER — Encounter: Payer: Self-pay | Admitting: Family Medicine

## 2022-01-13 ENCOUNTER — Ambulatory Visit (INDEPENDENT_AMBULATORY_CARE_PROVIDER_SITE_OTHER): Payer: PRIVATE HEALTH INSURANCE | Admitting: Family Medicine

## 2022-01-13 VITALS — BP 112/76 | HR 80 | Temp 97.9°F | Ht 62.0 in | Wt 212.2 lb

## 2022-01-13 DIAGNOSIS — M1990 Unspecified osteoarthritis, unspecified site: Secondary | ICD-10-CM | POA: Diagnosis not present

## 2022-01-13 DIAGNOSIS — I10 Essential (primary) hypertension: Secondary | ICD-10-CM | POA: Diagnosis not present

## 2022-01-13 DIAGNOSIS — F332 Major depressive disorder, recurrent severe without psychotic features: Secondary | ICD-10-CM | POA: Diagnosis not present

## 2022-01-13 DIAGNOSIS — F419 Anxiety disorder, unspecified: Secondary | ICD-10-CM | POA: Diagnosis not present

## 2022-01-13 DIAGNOSIS — M791 Myalgia, unspecified site: Secondary | ICD-10-CM

## 2022-01-13 DIAGNOSIS — E875 Hyperkalemia: Secondary | ICD-10-CM

## 2022-01-13 MED ORDER — CYCLOBENZAPRINE HCL 7.5 MG PO TABS: 7.5000 mg | ORAL_TABLET | Freq: Two times a day (BID) | ORAL | 1 refills | Status: DC | PRN

## 2022-01-13 MED ORDER — DICLOFENAC SODIUM 75 MG PO TBEC: 75.0000 mg | DELAYED_RELEASE_TABLET | Freq: Two times a day (BID) | ORAL | 1 refills | Status: DC

## 2022-01-13 NOTE — Progress Notes (Signed)
Assessment & Plan:  1. Anxiety Well controlled on current regimen.   2. Major depressive disorder, recurrent severe without psychotic features ([place]) Well controlled on current regimen.   3. Essential hypertension Well controlled on current regimen.  - CMP14+EGFR  4. Arthritis - diclofenac (VOLTAREN) 75 MG EC tablet; Take 1 tablet (75 mg total) by mouth 2 (two) times daily.  Dispense: 180 tablet; Refill: 1  5. Muscle pain - cyclobenzaprine (FEXMID) 7.5 MG tablet; Take 1 tablet (7.5 mg total) by mouth 2 (two) times daily as needed for muscle spasms.  Dispense: 30 tablet; Refill: 1  6. Hyperkalemia - CMP14+EGFR   Return in about 6 months (around 07/16/2022) for annual physical.  Hendricks Limes, MSN, APRN, FNP-C [facility]  Subjective:    Patient ID: Sandra Carroll, female    DOB: 10/12/1965, 56 y.o.   MRN: [id number]  Patient Care Team: Loman Brooklyn, FNP as PCP - General (Family Medicine) Gala Romney Cristopher Estimable, MD as Consulting Physician (Gastroenterology) Loman Brooklyn, FNP (Family Medicine)   Chief Complaint:  Chief Complaint  Patient presents with   Medical Management of Chronic Issues    HPI: Sandra Carroll is a 56 y.o. female presenting on 01/13/2022 for Medical Management of Chronic Issues  Depression/Anxiety: controlled with Celexa.      01/13/2022    3:06 PM 08/30/2021   11:08 AM 07/19/2021    8:45 AM  Depression screen PHQ 2/9  Decreased Interest 1 0 0  Down, Depressed, Hopeless 0 0 0  PHQ - 2 Score 1 0 0  Altered sleeping 0  0  Tired, decreased energy 3  3  Change in appetite 0  0  Feeling bad or failure about yourself  0  0  Trouble concentrating 0  0  Moving slowly or fidgety/restless 0  0  Suicidal thoughts 0  0  PHQ-9 Score 4  3  Difficult doing work/chores Not difficult at all  Not difficult at all      01/13/2022    3:07 PM 07/19/2021    8:46 AM 07/13/2021    3:55 PM 06/24/2021    2:47 PM  GAD 7 : Generalized  Anxiety Score  Nervous, Anxious, on Edge 0 0 0 0  Control/stop worrying 0 0 0 0  Worry too much - different things 0 3 3 1   Trouble relaxing 0 0 0 0  Restless 0 0 0 0  Easily annoyed or irritable 0 0 0 0  Afraid - awful might happen 0 0 0 0  Total GAD 7 Score 0 3 3 1   Anxiety Difficulty Not difficult at all Not difficult at all  Not difficult at all    Hypertension: taking her blood pressure medication.   New complaints: None   Social history:  Relevant past medical, surgical, family and social history reviewed and updated as indicated. Interim medical history since our last visit reviewed.  Allergies and medications reviewed and updated.  DATA REVIEWED: CHART IN EPIC  ROS: Negative unless specifically indicated above in HPI.    Current Outpatient Medications:    calcium carbonate (OS-CAL - DOSED IN MG OF ELEMENTAL CALCIUM) 1250 (500 Ca) MG tablet, Take 1 tablet by mouth in the morning and at bedtime., Disp: , Rfl:    citalopram (CELEXA) 20 MG tablet, Take 1 tablet (20 mg total) by mouth daily., Disp: 90 tablet, Rfl: 1   cyclobenzaprine (FEXMID) 7.5 MG tablet, Take 1 tablet (7.5 mg total) by mouth  2 (two) times daily as needed for muscle spasms., Disp: 30 tablet, Rfl: 1   diclofenac (VOLTAREN) 75 MG EC tablet, Take 1 tablet (75 mg total) by mouth 2 (two) times daily as needed., Disp: 60 tablet, Rfl: 2   lisinopril (ZESTRIL) 20 MG tablet, Take 1 tablet (20 mg total) by mouth daily., Disp: 90 tablet, Rfl: 1   simvastatin (ZOCOR) 20 MG tablet, Take 1 tablet (20 mg total) by mouth daily at 6 PM., Disp: 90 tablet, Rfl: 1   gabapentin (NEURONTIN) 300 MG capsule, Take 1 capsule (300 mg total) by mouth 3 (three) times daily. (Patient not taking: Reported on 01/13/2022), Disp: 90 capsule, Rfl: 3   meclizine (ANTIVERT) 25 MG tablet, Take 1 tablet (25 mg total) by mouth 3 (three) times daily as needed for dizziness. (Patient not taking: Reported on 01/13/2022), Disp: 30 tablet, Rfl: 0    sodium bicarbonate 650 MG tablet, Take 1 tablet (650 mg total) by mouth 2 (two) times daily. (Patient not taking: Reported on 01/13/2022), Disp: 60 tablet, Rfl: 11   No Known Allergies Past Medical History:  Diagnosis Date   Acute pyelonephritis 05/07/2020   Anxiety    Chronic back pain    Complication of anesthesia 1991 and 1998   childbirth --reaction to anesthesia was itching.    DDD (degenerative disc disease)    Depression    Headache    migraines   Hemorrhoids    History of kidney stones    Hyperlipidemia    Hypertension    Iron deficiency anemia 07/20/2020   Kidney stone    PONV (postoperative nausea and vomiting)    Vitamin B12 deficiency 07/20/2020    Past Surgical History:  Procedure Laterality Date   [place]   pt had itching with 1991 c-section.    CHOLECYSTECTOMY     COLONOSCOPY N/A 03/01/2013   Dr. Gala Romney- normal rectum except for anal canal/internal hemorrhoidal tags. colonic mucosa appeared normal.   CYSTOSCOPY W/ URETERAL STENT PLACEMENT Left 12/09/2019   Procedure: CYSTOSCOPY WITH RETROGRADE PYELOGRAM/URETERAL STENT PLACEMENT;  Surgeon: Irine Seal, MD;  Location: [facility];  Service: Urology;  Laterality: Left;   CYSTOSCOPY W/ URETERAL STENT PLACEMENT Right 05/07/2020   Procedure: CYSTOSCOPY WITH RETROGRADE PYELOGRAM/URETERAL STENT PLACEMENT;  Surgeon: Festus Aloe, MD;  Location: [facility];  Service: Urology;  Laterality: Right;   CYSTOSCOPY WITH RETROGRADE PYELOGRAM, URETEROSCOPY AND STENT PLACEMENT Right 06/02/2020   Procedure: CYSTOSCOPY WITH RETROGRADE PYELOGRAM, URETEROSCOPY, BASKET EXTRACTION OF STONES AND STENT EXCHANGE;  Surgeon: Cleon Gustin, MD;  Location: [facility];  Service: Urology;  Laterality: Right;   CYSTOSCOPY WITH RETROGRADE PYELOGRAM, URETEROSCOPY AND STENT PLACEMENT  03/11/2021   Procedure: CYSTOSCOPY WITH BILATERAL RETROGRADE PYELOGRAM, BILATERAL URETEROSCOPY AND RIGHT URETERAL STENT PLACEMENT;   Surgeon: Cleon Gustin, MD;  Location: [facility];  Service: Urology;;   CYSTOSCOPY/URETEROSCOPY/HOLMIUM LASER/STENT PLACEMENT Left 12/17/2019   Procedure: LEFT URETEROSCOPY/STENT EXCHANGE/ EXTRACTION OF LEFT URETERAL CALCULI;  Surgeon: Irine Seal, MD;  Location: [facility];  Service: Urology;  Laterality: Left;   KNEE ARTHROSCOPY Right 2003   LITHOTRIPSY     LUMBAR LAMINECTOMY/DECOMPRESSION MICRODISCECTOMY  03/02/2012   Procedure: LUMBAR LAMINECTOMY/DECOMPRESSION MICRODISCECTOMY 1 LEVEL;  Surgeon: Winfield Cunas, MD;  Location: [facility];  Service: Neurosurgery;  Laterality: Left;  LEFT Lumbar Five-Sacral One Laminectomy for Decompression    STONE EXTRACTION WITH BASKET  03/11/2021   Procedure: STONE EXTRACTION WITH BASKET BILATERAL;  Surgeon: Cleon Gustin, MD;  Location: [facility];  Service: Urology;;    Social History   Socioeconomic History   Marital status: Single    Spouse name: Not on file   Number of children: Not on file   Years of education: Not on file   Highest education level: Not on file  Occupational History   Not on file  Tobacco Use   Smoking status: Former    Packs/day: 0.25    Years: 30.00    Total pack years: 7.50    Types: Cigarettes    Quit date: 2010    Years since quitting: 13.5   Smokeless tobacco: Never  Vaping Use   Vaping Use: Never used  Substance and Sexual Activity   Alcohol use: Yes    Comment: rare   Drug use: Never   Sexual activity: Not Currently    Birth control/protection: Pill  Other Topics Concern   Not on file  Social History Narrative   ** Merged History Encounter **       Social Determinants of Health   Financial Resource Strain: Not on file  Food Insecurity: Not on file  Transportation Needs: Not on file  Physical Activity: Not on file  Stress: Not on file  Social Connections: Not on file  Intimate Partner Violence: Not on file        Objective:    BP 112/76   Pulse 80   Temp 97.9 F (36.6 C) (Temporal)   Ht 5'  2" (1.575 m)   Wt 212 lb 3.2 oz (96.3 kg)   LMP 10/17/2019 (Exact Date)   SpO2 95%   BMI 38.81 kg/m   Wt Readings from Last 3 Encounters:  01/13/22 212 lb 3.2 oz (96.3 kg)  07/19/21 206 lb (93.4 kg)  07/13/21 208 lb (94.3 kg)    Physical Exam Vitals reviewed.  Constitutional:      General: She is not in acute distress.    Appearance: Normal appearance. She is obese. She is not ill-appearing, toxic-appearing or diaphoretic.  HENT:     Head: Normocephalic and atraumatic.  Eyes:     General: No scleral icterus.       Right eye: No discharge.        Left eye: No discharge.     Conjunctiva/sclera: Conjunctivae normal.  Cardiovascular:     Rate and Rhythm: Normal rate.  Pulmonary:     Effort: Pulmonary effort is normal. No respiratory distress.  Musculoskeletal:        General: Normal range of motion.     Cervical back: Normal range of motion.  Skin:    General: Skin is warm and dry.     Capillary Refill: Capillary refill takes less than 2 seconds.  Neurological:     General: No focal deficit present.     Mental Status: She is alert and oriented to person, place, and time. Mental status is at baseline.  Psychiatric:        Mood and Affect: Mood normal.        Behavior: Behavior normal.        Thought Content: Thought content normal.        Judgment: Judgment normal.     Lab Results  Component Value Date   TSH 0.553 10/28/2020   Lab Results  Component Value Date   WBC 7.3 06/24/2021   HGB 13.1 06/24/2021   HCT 38.3 06/24/2021   MCV 92 06/24/2021   PLT 312 06/24/2021   Lab Results  Component Value Date  NA 143 07/12/2021   K 5.3 (H) 07/12/2021   CO2 23 07/12/2021   GLUCOSE 95 07/12/2021   BUN 22 07/12/2021   CREATININE 1.06 (H) 07/12/2021   BILITOT <0.2 06/24/2021   ALKPHOS 138 (H) 06/24/2021   AST 21 06/24/2021   ALT 15 06/24/2021   PROT 7.3 06/24/2021   ALBUMIN 4.2 06/24/2021   CALCIUM 10.9 (H) 07/12/2021   ANIONGAP 6 05/09/2020   EGFR 63  07/12/2021   Lab Results  Component Value Date   CHOL 177 06/24/2021   Lab Results  Component Value Date   HDL 50 06/24/2021   Lab Results  Component Value Date   LDLCALC 98 06/24/2021   Lab Results  Component Value Date   TRIG 167 (H) 06/24/2021   Lab Results  Component Value Date   CHOLHDL 3.5 06/24/2021   No results found for: "HGBA1C"

## 2022-01-14 LAB — CMP14+EGFR
ALT: 15 IU/L (ref 0–32)
AST: 24 IU/L (ref 0–40)
Albumin/Globulin Ratio: 1.6 (ref 1.2–2.2)
Albumin: 4.5 g/dL (ref 3.8–4.9)
Alkaline Phosphatase: 115 IU/L (ref 44–121)
BUN/Creatinine Ratio: 16 (ref 9–23)
BUN: 17 mg/dL (ref 6–24)
Bilirubin Total: 0.3 mg/dL (ref 0.0–1.2)
CO2: 21 mmol/L (ref 20–29)
Calcium: 10.7 mg/dL — ABNORMAL HIGH (ref 8.7–10.2)
Chloride: 108 mmol/L — ABNORMAL HIGH (ref 96–106)
Creatinine, Ser: 1.04 mg/dL — ABNORMAL HIGH (ref 0.57–1.00)
Globulin, Total: 2.8 g/dL (ref 1.5–4.5)
Glucose: 97 mg/dL (ref 70–99)
Potassium: 4.2 mmol/L (ref 3.5–5.2)
Sodium: 143 mmol/L (ref 134–144)
Total Protein: 7.3 g/dL (ref 6.0–8.5)
eGFR: 64 mL/min/{1.73_m2} (ref >59)

## 2022-01-18 ENCOUNTER — Ambulatory Visit (HOSPITAL_COMMUNITY): Payer: 59

## 2022-01-19 ENCOUNTER — Ambulatory Visit: Payer: PRIVATE HEALTH INSURANCE | Admitting: Urology

## 2022-02-02 ENCOUNTER — Ambulatory Visit: Payer: PRIVATE HEALTH INSURANCE | Admitting: Urology

## 2022-02-10 ENCOUNTER — Other Ambulatory Visit: Payer: Self-pay | Admitting: Family Medicine

## 2022-02-10 DIAGNOSIS — M791 Myalgia, unspecified site: Secondary | ICD-10-CM

## 2022-06-15 IMAGING — CT CT RENAL STONE PROTOCOL
2 of 4 series · 16 of 46 positions shown, 18 images · non-contrast
Comparison: None.

CLINICAL DATA: Right-sided lower back pain

EXAM:
CT ABDOMEN AND PELVIS WITHOUT CONTRAST
TECHNIQUE: Multidetector CT imaging of the abdomen and pelvis was performed
following the standard protocol without IV contrast.

[Series 2: axial st · axial · 0.85mm/px · z∈[+136,+561]mm · 13 of 95 slices shown, 15 images]
[im 5/95  soft-tissue]
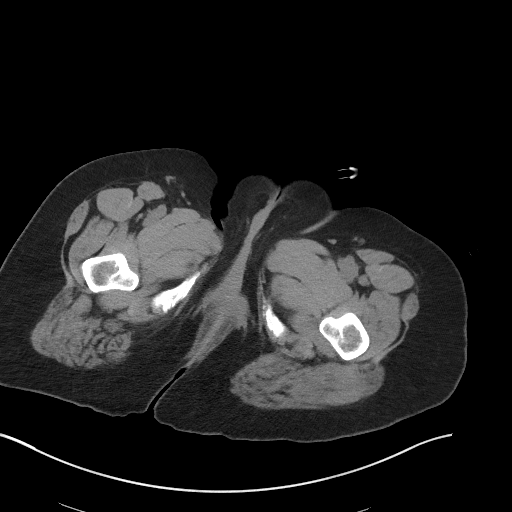
[im 5/95  bone]
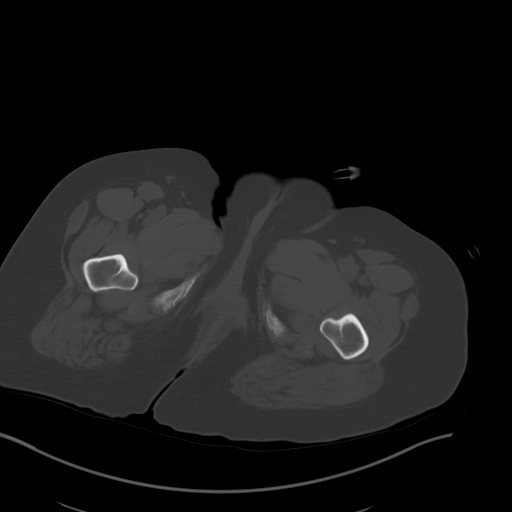
[im 15/95  soft-tissue]
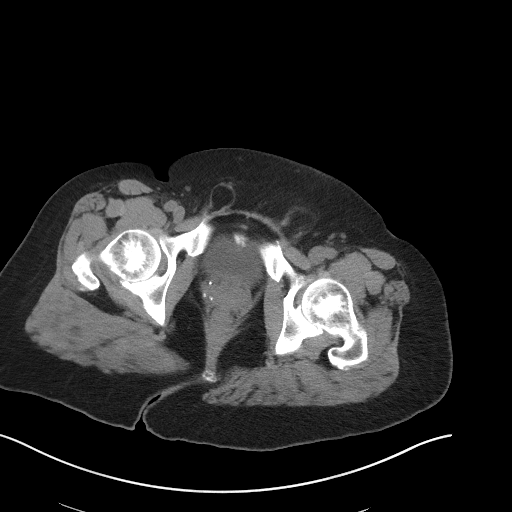
[im 20/95  soft-tissue]
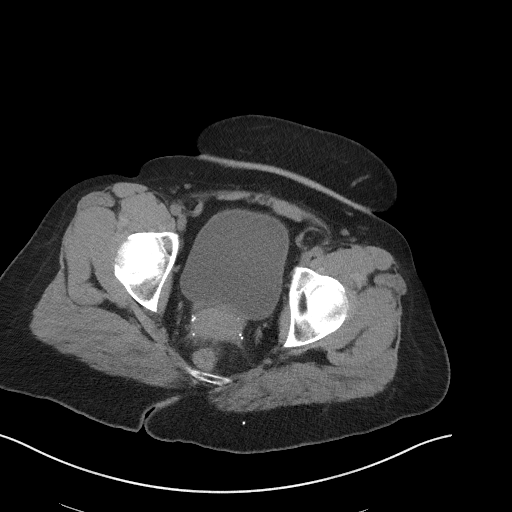
[im 25/95  soft-tissue]
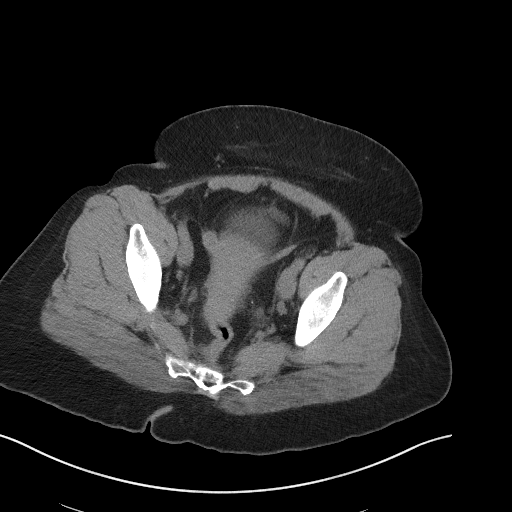
[im 35/95  soft-tissue]
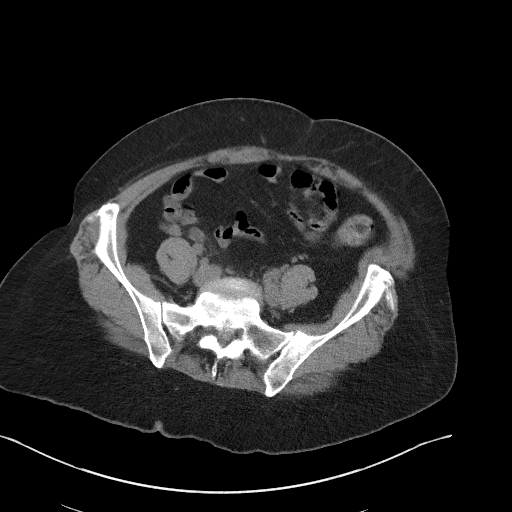
[im 40/95  soft-tissue]
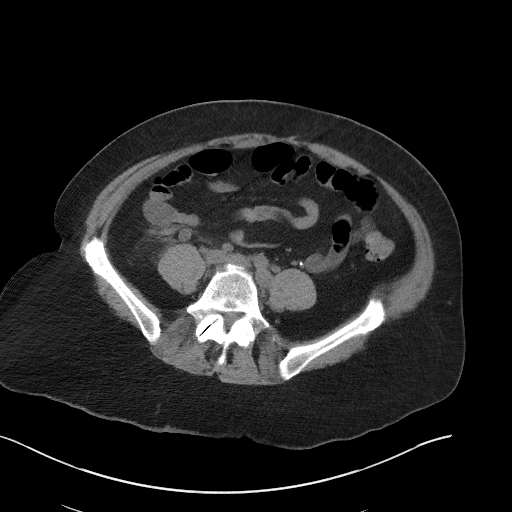
[im 50/95  soft-tissue]
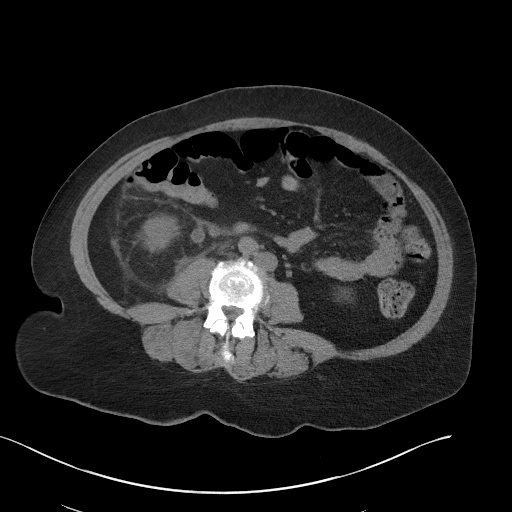
[im 55/95  soft-tissue]
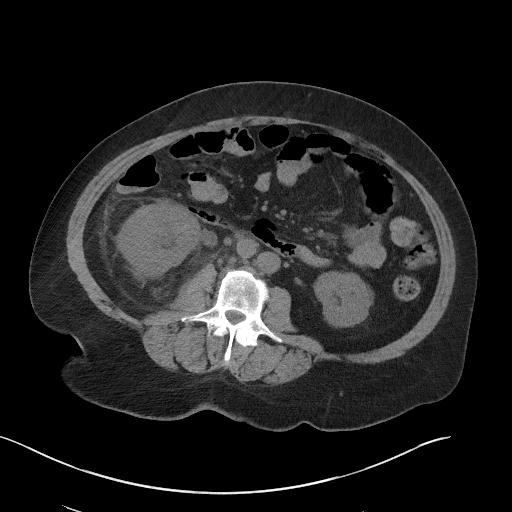
[im 60/95  soft-tissue]
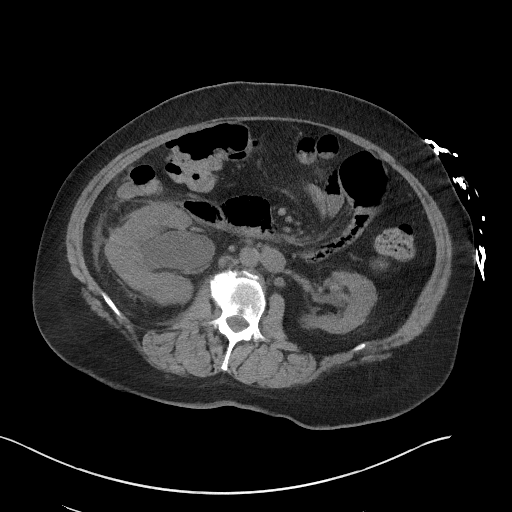
[im 60/95  bone]
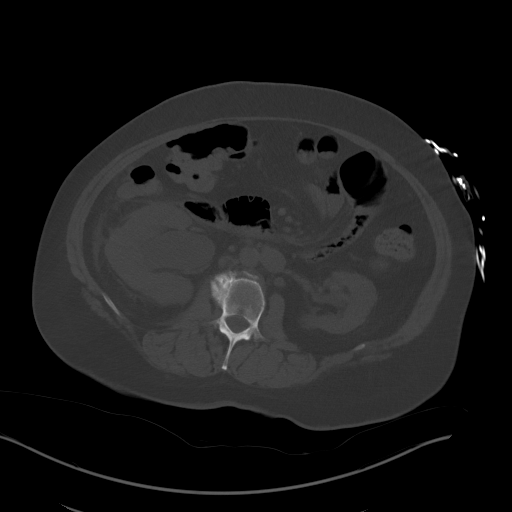
[im 70/95  soft-tissue]
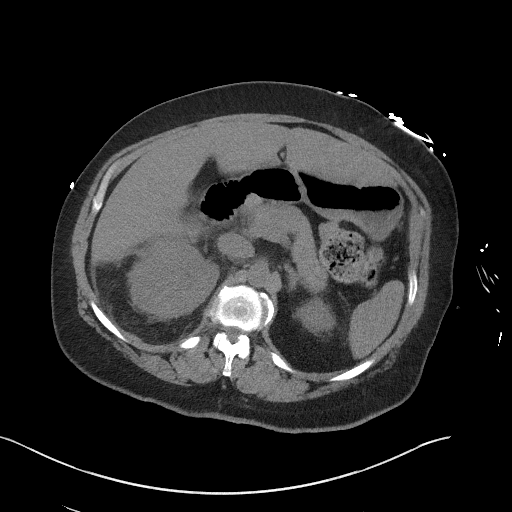
[im 75/95  soft-tissue]
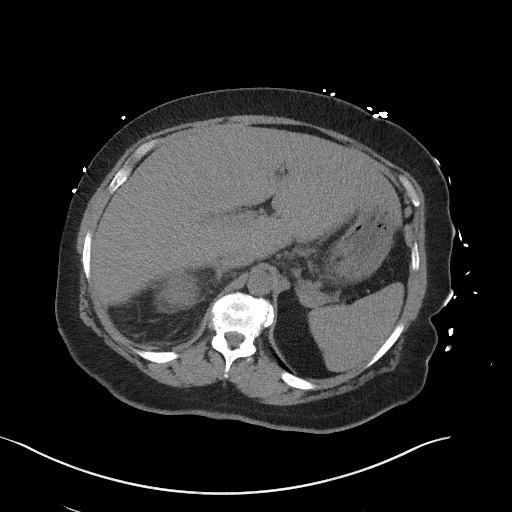
[im 80/95  soft-tissue]
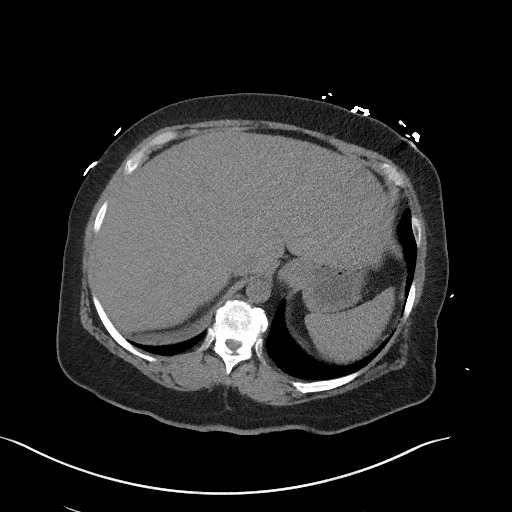
[im 90/95  soft-tissue]
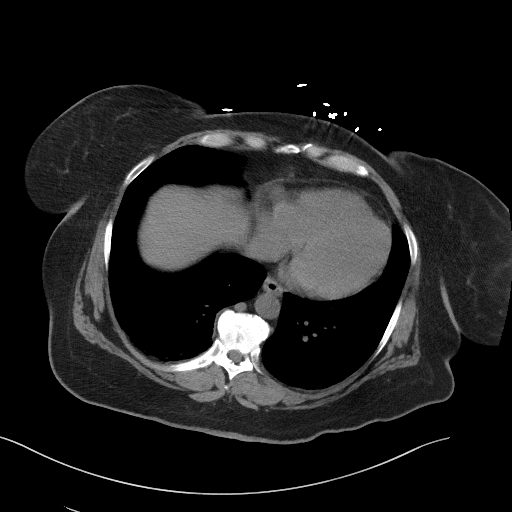

[Series 5: coronal st · coronal · 0.86mm/px · 3 of 112 slices shown]
[im 38/112  soft-tissue]
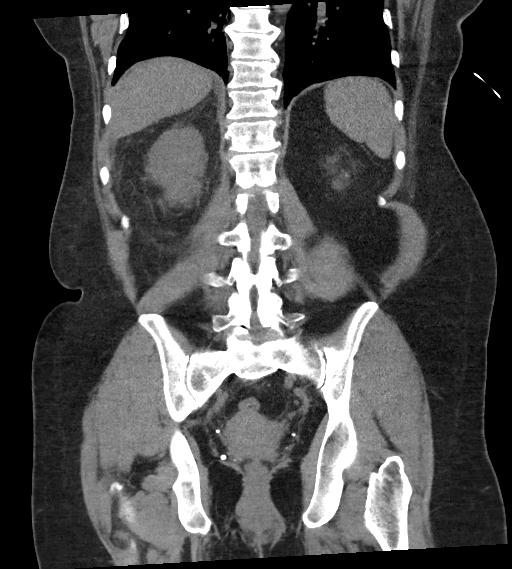
[im 50/112  soft-tissue]
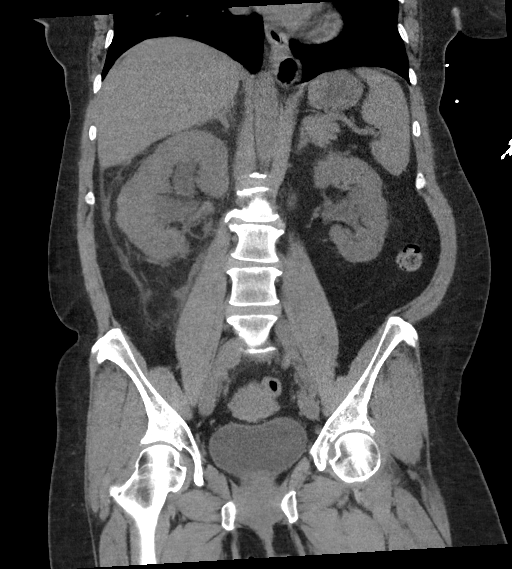
[im 62/112  soft-tissue]
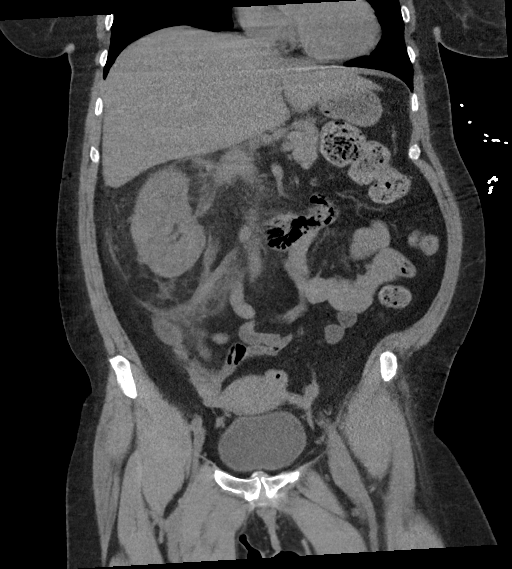

[16 of 46 positions shown; findings below may reference images not displayed]

FINDINGS: Lower chest: The visualized heart size within normal limits. No
pericardial fluid/thickening.

No hiatal hernia.

The visualized portions of the lungs are clear.

Hepatobiliary: Although limited due to the lack of intravenous
contrast, normal in appearance without gross focal abnormality. The
patient is status post cholecystectomy. No biliary ductal dilation.

Pancreas:  Unremarkable.  No surrounding inflammatory changes.

Spleen: Normal in size. Although limited due to the lack of
intravenous contrast, normal in appearance.

Adrenals/Urinary Tract: Both adrenal glands appear normal. Moderate
right pelvicaliectasis and ureterectasis is seen down the level of
the distal ureter where there is several ureteral calculi 1
measuring 3 mm and 1 measuring 4 mm. There is right-sided
perinephric and proximal periureteral stranding changes. There is a
tiny punctate calcification in the posterior right bladder. There is
also a a 3 mm calculus seen in the mid pole the right kidney. No
left-sided renal or collecting system calculi are noted.

Stomach/Bowel: The stomach, small bowel, and colon are normal in
appearance. No inflammatory changes or obstructive findings.
Scattered colonic diverticula are noted.

Vascular/Lymphatic: There are no enlarged abdominal or pelvic lymph
nodes. Scattered aortic atherosclerotic calcifications are seen
without aneurysmal dilatation.

Reproductive: The uterus and adnexa are unremarkable.

Other: Bilateral fat containing inguinal hernias are noted.

Musculoskeletal: No acute or significant osseous findings.
IMPRESSION: Moderate right hydronephrosis to the distal ureter where there is
several calcified ureteral calculi measuring 3 mm and 4 mm. There is
also punctate calcification in the posterior right bladder.

Right-sided perinephric and periureteral stranding which could be
from the recently passed stone versus pyelonephritis.

Nonobstructing right renal calculus.

## 2022-06-24 ENCOUNTER — Encounter: Payer: Self-pay | Admitting: Family Medicine

## 2022-06-24 ENCOUNTER — Telehealth (INDEPENDENT_AMBULATORY_CARE_PROVIDER_SITE_OTHER): Payer: 59 | Admitting: Family Medicine

## 2022-06-24 DIAGNOSIS — F419 Anxiety disorder, unspecified: Secondary | ICD-10-CM | POA: Diagnosis not present

## 2022-06-24 DIAGNOSIS — E559 Vitamin D deficiency, unspecified: Secondary | ICD-10-CM | POA: Diagnosis not present

## 2022-06-24 DIAGNOSIS — E7800 Pure hypercholesterolemia, unspecified: Secondary | ICD-10-CM | POA: Diagnosis not present

## 2022-06-24 DIAGNOSIS — I10 Essential (primary) hypertension: Secondary | ICD-10-CM | POA: Diagnosis not present

## 2022-06-24 DIAGNOSIS — F332 Major depressive disorder, recurrent severe without psychotic features: Secondary | ICD-10-CM | POA: Diagnosis not present

## 2022-06-24 DIAGNOSIS — E538 Deficiency of other specified B group vitamins: Secondary | ICD-10-CM | POA: Diagnosis not present

## 2022-06-24 MED ORDER — SIMVASTATIN 20 MG PO TABS: 20.0000 mg | ORAL_TABLET | Freq: Every day | ORAL | 2 refills | Status: DC

## 2022-06-24 MED ORDER — CITALOPRAM HYDROBROMIDE 20 MG PO TABS: 20.0000 mg | ORAL_TABLET | Freq: Every day | ORAL | 2 refills | Status: DC

## 2022-06-24 MED ORDER — LISINOPRIL 20 MG PO TABS: 20.0000 mg | ORAL_TABLET | Freq: Every day | ORAL | 2 refills | Status: DC

## 2022-06-24 NOTE — Progress Notes (Signed)
Virtual Visit via MyChart Video Note Due to COVID-19 pandemic this visit was conducted virtually. This visit type was conducted due to national recommendations for restrictions regarding the COVID-19 Pandemic (e.g. social distancing, sheltering in place) in an effort to limit this patient's exposure and mitigate transmission in our community. All issues noted in this document were discussed and addressed.  A physical exam was not performed with this format.   I connected with Sandra Carroll on 06/24/2022 at 1300 by MyChart Video and verified that I am speaking with the correct person using two identifiers. Sandra Carroll is currently located at home and family is currently with them during visit. The provider, Kari Baars, FNP is located in their office at time of visit.  I discussed the limitations, risks, security and privacy concerns of performing an evaluation and management service by virtual visit and the availability of in person appointments. I also discussed with the patient that there may be a patient responsible charge related to this service. The patient expressed understanding and agreed to proceed.  Subjective:  Patient ID: Sandra Carroll, female    DOB: Jul 18, 1965, 57 y.o.   MRN: [id number]  Chief Complaint:  Medical Management of Chronic Issues   HPI: Sandra Carroll is a 57 y.o. female presenting on 06/24/2022 for Medical Management of Chronic Issues States she recently had COVID and is getting over this. Still has a slight cough ad congestion. Improving daily.   1. Anxiety 2. Major depressive disorder, recurrent severe without psychotic features (HCC) Has been on citalopram for a while and is doing well. Denies adverse reactions. Feels medications are controlling her symptoms well.      06/24/2022    1:13 PM 01/13/2022    3:06 PM 08/30/2021   11:08 AM 07/19/2021    8:45 AM 07/13/2021    3:55 PM  Depression screen PHQ 2/9  Decreased Interest 0 1 0 0 0  Down, Depressed,  Hopeless 1 0 0 0 0  PHQ - 2 Score 1 1 0 0 0  Altered sleeping 0 0  0 0  Tired, decreased energy 0 3  3 3   Change in appetite 0 0  0 0  Feeling bad or failure about yourself  0 0  0 0  Trouble concentrating 0 0  0 0  Moving slowly or fidgety/restless 0 0  0 0  Suicidal thoughts 0 0  0 0  PHQ-9 Score 1 4  3 3   Difficult doing work/chores  Not difficult at all  Not difficult at all       06/24/2022    1:14 PM 01/13/2022    3:07 PM 07/19/2021    8:46 AM 07/13/2021    3:55 PM  GAD 7 : Generalized Anxiety Score  Nervous, Anxious, on Edge 1 0 0 0  Control/stop worrying 0 0 0 0  Worry too much - different things 0 0 3 3  Trouble relaxing 0 0 0 0  Restless 0 0 0 0  Easily annoyed or irritable 0 0 0 0  Afraid - awful might happen 0 0 0 0  Total GAD 7 Score 1 0 3 3  Anxiety Difficulty  Not difficult at all Not difficult at all     3. Pure hypercholesterolemia On statin therapy and tolerating well. Denies associated side effects. No myalgias.   4. Essential hypertension Compliant with lisinopril. No associated cough or angioedema. No chest pain, leg swelling, palpitations, headaches, weakness, confusion, or syncope.  5. Vitamin B12 deficiency Currently not on repletion therapy. Was on oral in the past.   6. Vitamin D deficiency On calcium and vit d repletion therapy. Denies arthralgias, trouble walking or recent fractures.     Relevant past medical, surgical, family, and social history reviewed and updated as indicated.  Allergies and medications reviewed and updated.   Past Medical History:  Diagnosis Date   Acute pyelonephritis 05/07/2020   Anxiety    Chronic back pain    Complication of anesthesia 1991 and 1998   childbirth --reaction to anesthesia was itching.    DDD (degenerative disc disease)    Depression    Headache    migraines   Hemorrhoids    History of kidney stones    Hyperlipidemia    Hypertension    Iron deficiency anemia 07/20/2020   Kidney stone     PONV (postoperative nausea and vomiting)    Vitamin B12 deficiency 07/20/2020    Past Surgical History:  Procedure Laterality Date   ADENOIDECTOMY  1978   CESAREAN SECTION  1991 1998   pt had itching with 1991 c-section.    CHOLECYSTECTOMY     COLONOSCOPY N/A 03/01/2013   Dr. Jena Gauss- normal rectum except for anal canal/internal hemorrhoidal tags. colonic mucosa appeared normal.   CYSTOSCOPY W/ URETERAL STENT PLACEMENT Left 12/09/2019   Procedure: CYSTOSCOPY WITH RETROGRADE PYELOGRAM/URETERAL STENT PLACEMENT;  Surgeon: Bjorn Pippin, MD;  Location: [facility];  Service: Urology;  Laterality: Left;   CYSTOSCOPY W/ URETERAL STENT PLACEMENT Right 05/07/2020   Procedure: CYSTOSCOPY WITH RETROGRADE PYELOGRAM/URETERAL STENT PLACEMENT;  Surgeon: Jerilee Field, MD;  Location: [facility];  Service: Urology;  Laterality: Right;   CYSTOSCOPY WITH RETROGRADE PYELOGRAM, URETEROSCOPY AND STENT PLACEMENT Right 06/02/2020   Procedure: CYSTOSCOPY WITH RETROGRADE PYELOGRAM, URETEROSCOPY, BASKET EXTRACTION OF STONES AND STENT EXCHANGE;  Surgeon: Malen Gauze, MD;  Location: [facility];  Service: Urology;  Laterality: Right;   CYSTOSCOPY WITH RETROGRADE PYELOGRAM, URETEROSCOPY AND STENT PLACEMENT  03/11/2021   Procedure: CYSTOSCOPY WITH BILATERAL RETROGRADE PYELOGRAM, BILATERAL URETEROSCOPY AND RIGHT URETERAL STENT PLACEMENT;  Surgeon: Malen Gauze, MD;  Location: [facility];  Service: Urology;;   CYSTOSCOPY/URETEROSCOPY/HOLMIUM LASER/STENT PLACEMENT Left 12/17/2019   Procedure: LEFT URETEROSCOPY/STENT EXCHANGE/ EXTRACTION OF LEFT URETERAL CALCULI;  Surgeon: Bjorn Pippin, MD;  Location: [facility];  Service: Urology;  Laterality: Left;   KNEE ARTHROSCOPY Right 2003   LITHOTRIPSY     LUMBAR LAMINECTOMY/DECOMPRESSION MICRODISCECTOMY  03/02/2012   Procedure: LUMBAR LAMINECTOMY/DECOMPRESSION MICRODISCECTOMY 1 LEVEL;  Surgeon: Carmela Hurt, MD;  Location: [facility];  Service: Neurosurgery;  Laterality: Left;  LEFT Lumbar  Five-Sacral One Laminectomy for Decompression    STONE EXTRACTION WITH BASKET  03/11/2021   Procedure: STONE EXTRACTION WITH BASKET BILATERAL;  Surgeon: Malen Gauze, MD;  Location: [facility];  Service: Urology;;    Social History   Socioeconomic History   Marital status: Single    Spouse name: Not on file   Number of children: Not on file   Years of education: Not on file   Highest education level: Not on file  Occupational History   Not on file  Tobacco Use   Smoking status: Former    Packs/day: 0.25    Years: 30.00    Total pack years: 7.50    Types: Cigarettes    Quit date: 2010    Years since quitting: 14.0   Smokeless tobacco: Never  Vaping Use   Vaping Use: Never used  Substance and Sexual Activity  Alcohol use: Yes    Comment: rare   Drug use: Never   Sexual activity: Not Currently    Birth control/protection: Pill  Other Topics Concern   Not on file  Social History Narrative   ** Merged History Encounter **       Social Determinants of Health   Financial Resource Strain: Not on file  Food Insecurity: Not on file  Transportation Needs: Not on file  Physical Activity: Not on file  Stress: Not on file  Social Connections: Not on file  Intimate Partner Violence: Not on file    Outpatient Encounter Medications as of 06/24/2022  Medication Sig   calcium carbonate (OS-CAL - DOSED IN MG OF ELEMENTAL CALCIUM) 1250 (500 Ca) MG tablet Take 1 tablet by mouth in the morning and at bedtime.   citalopram (CELEXA) 20 MG tablet Take 1 tablet (20 mg total) by mouth daily.   cyclobenzaprine (FLEXERIL) 10 MG tablet Take 1 tablet (10 mg total) by mouth 2 (two) times daily as needed for muscle spasms.   diclofenac (VOLTAREN) 75 MG EC tablet Take 1 tablet (75 mg total) by mouth 2 (two) times daily.   lisinopril (ZESTRIL) 20 MG tablet Take 1 tablet (20 mg total) by mouth daily.   simvastatin (ZOCOR) 20 MG tablet Take 1 tablet (20 mg total) by mouth daily at 6 PM.    [DISCONTINUED] citalopram (CELEXA) 20 MG tablet Take 1 tablet (20 mg total) by mouth daily.   [DISCONTINUED] lisinopril (ZESTRIL) 20 MG tablet Take 1 tablet (20 mg total) by mouth daily.   [DISCONTINUED] simvastatin (ZOCOR) 20 MG tablet Take 1 tablet (20 mg total) by mouth daily at 6 PM.   No facility-administered encounter medications on file as of 06/24/2022.    No Known Allergies  Review of Systems  Constitutional:  Negative for activity change, appetite change, chills, diaphoresis, fatigue, fever and unexpected weight change.  HENT:  Positive for congestion.   Eyes: Negative.  Negative for photophobia and visual disturbance.  Respiratory:  Positive for cough. Negative for apnea, choking, chest tightness, shortness of breath, wheezing and stridor.   Cardiovascular:  Negative for chest pain, palpitations and leg swelling.  Gastrointestinal:  Negative for abdominal pain, blood in stool, constipation, diarrhea, nausea and vomiting.  Endocrine: Negative.  Negative for polydipsia, polyphagia and polyuria.  Genitourinary:  Negative for decreased urine volume, difficulty urinating, dysuria, frequency and urgency.  Musculoskeletal:  Negative for arthralgias and myalgias.  Skin: Negative.   Allergic/Immunologic: Negative.   Neurological:  Negative for dizziness, tremors, seizures, syncope, facial asymmetry, speech difficulty, weakness, light-headedness, numbness and headaches.  Hematological: Negative.   Psychiatric/Behavioral:  Negative for agitation, behavioral problems, confusion, decreased concentration, dysphoric mood, hallucinations, self-injury, sleep disturbance and suicidal ideas. The patient is nervous/anxious. The patient is not hyperactive.   All other systems reviewed and are negative.        Observations/Objective: No vital signs or physical exam, this was a virtual health encounter.  Pt alert and oriented, answers all questions appropriately, and able to speak in full  sentences.    Assessment and Plan: Sandra Carroll was seen today for medical management of chronic issues.  Diagnoses and all orders for this visit:  Essential hypertension Pt to come to office to have labs completed. DASH diet and exercise encouraged. Continue current medications.  -     lisinopril (ZESTRIL) 20 MG tablet; Take 1 tablet (20 mg total) by mouth daily. -     CBC with Differential/Platelet; Future -  CMP14+EGFR; Future -     Lipid panel; Future -     Thyroid Panel With TSH; Future  Anxiety Major depressive disorder, recurrent severe without psychotic features ([place]) Doing well on below, will continue. Will check thyroid function.  -     citalopram (CELEXA) 20 MG tablet; Take 1 tablet (20 mg total) by mouth daily. -     Thyroid Panel With TSH; Future  Pure hypercholesterolemia Pt to come to office to have labs completed. Diet and exercise encouraged. Continue current medication, will adjust if warranted.  -     simvastatin (ZOCOR) 20 MG tablet; Take 1 tablet (20 mg total) by mouth daily at 6 PM. -     CMP14+EGFR; Future -     Lipid panel; Future  Vitamin B12 deficiency Will check labs and restart repletion therapy if warranted. -     Vitamin B12; Future  Vitamin D deficiency Labs pending. Continue repletion therapy. If indicated, will change repletion dosage. Eat foods rich in Vit D including milk, orange juice, yogurt with vitamin D added, salmon or mackerel, canned tuna fish, cereals with vitamin D added, and cod liver oil. Get out in the sun but make sure to wear at least SPF 30 sunscreen.  -     VITAMIN D 25 Hydroxy (Vit-D Deficiency, Fractures); Future     Follow Up Instructions: Return in about 6 months (around 12/23/2022), or if symptoms worsen or fail to improve, for chronic follow up.    I discussed the assessment and treatment plan with the patient. The patient was provided an opportunity to ask questions and all were answered. The patient agreed with the plan  and demonstrated an understanding of the instructions.   The patient was advised to call back or seek an in-person evaluation if the symptoms worsen or if the condition fails to improve as anticipated.  The above assessment and management plan was discussed with the patient. The patient verbalized understanding of and has agreed to the management plan. Patient is aware to call the clinic if they develop any new symptoms or if symptoms persist or worsen. Patient is aware when to return to the clinic for a follow-up visit. Patient educated on when it is appropriate to go to the emergency department.    I provided 15 minutes of time during this MyChart Video encounter.   Monia Pouch, FNP-C [facility] [address] [place], [place] [address] [phone] 06/24/2022

## 2022-07-14 ENCOUNTER — Ambulatory Visit (INDEPENDENT_AMBULATORY_CARE_PROVIDER_SITE_OTHER): Payer: 59 | Admitting: Family Medicine

## 2022-07-14 ENCOUNTER — Encounter: Payer: Self-pay | Admitting: Family Medicine

## 2022-07-14 ENCOUNTER — Other Ambulatory Visit: Payer: Self-pay

## 2022-07-14 VITALS — BP 123/86 | HR 78 | Temp 98.3°F | Ht 62.0 in | Wt 219.1 lb

## 2022-07-14 DIAGNOSIS — M19072 Primary osteoarthritis, left ankle and foot: Secondary | ICD-10-CM

## 2022-07-14 DIAGNOSIS — E559 Vitamin D deficiency, unspecified: Secondary | ICD-10-CM

## 2022-07-14 DIAGNOSIS — I10 Essential (primary) hypertension: Secondary | ICD-10-CM

## 2022-07-14 DIAGNOSIS — M7752 Other enthesopathy of left foot: Secondary | ICD-10-CM | POA: Diagnosis not present

## 2022-07-14 DIAGNOSIS — E538 Deficiency of other specified B group vitamins: Secondary | ICD-10-CM

## 2022-07-14 DIAGNOSIS — E7800 Pure hypercholesterolemia, unspecified: Secondary | ICD-10-CM

## 2022-07-14 DIAGNOSIS — F332 Major depressive disorder, recurrent severe without psychotic features: Secondary | ICD-10-CM

## 2022-07-14 DIAGNOSIS — F419 Anxiety disorder, unspecified: Secondary | ICD-10-CM

## 2022-07-14 MED ORDER — PREDNISONE 20 MG PO TABS: 40.0000 mg | ORAL_TABLET | Freq: Every day | ORAL | 0 refills | Status: AC

## 2022-07-14 NOTE — Patient Instructions (Signed)

## 2022-07-14 NOTE — Progress Notes (Signed)
   Acute Office Visit  Subjective:     Patient ID: Sandra Carroll, female    DOB: Nov 01, 1965, 57 y.o.   MRN: [id number]  Chief Complaint  Patient presents with   Foot Pain    Foot Pain   Patient is in today for left foot pain for 2-3 weeks. The pain is medial midfoot and radiates down to her big toe. She has a bone spur on her mid foot and this area is tender to the touch and slightly swollen. The pain is achy with sharp shooting pain. Denies injury. She has had a xray on this foot before that shows bone spurs and arthritis. She has been taking tylenol, advil, and using a salve with a little improvement. 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 Denies injury. She would like a referral to podiatry.   ROS As per HPI.      Objective:    BP 123/86   Pulse 78   Temp 98.3 F (36.8 C) (Temporal)   Ht 5\' 2"  (1.575 m)   Wt 219 lb 2 oz (99.4 kg)   LMP 10/17/2019 (Exact Date)   SpO2 94%   BMI 40.08 kg/m    Physical Exam Vitals and nursing note reviewed.  Constitutional:      General: She is not in acute distress.    Appearance: She is not ill-appearing, toxic-appearing or diaphoretic.  Pulmonary:     Effort: Pulmonary effort is normal. No respiratory distress.  Musculoskeletal:     Right lower leg: No edema.     Left lower leg: No edema.     Comments: Left foot with saddle bone deformity with mild swelling and tenderness. No erythema or warmth. Full ROM. Brisk cap refill.   Skin:    General: Skin is warm and dry.  Neurological:     General: No focal deficit present.     Mental Status: She is alert and oriented to person, place, and time.  Psychiatric:        Mood and Affect: Mood normal.        Behavior: Behavior normal.        Thought Content: Thought content normal.        Judgment: Judgment normal.     No results found for any visits on 07/14/22.      Assessment & Plan:   Sandra Carroll was seen today for foot pain.  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 Prednisone burst as below. Wear supportive shoes. Continue tylenol, advil, rest. Referral to podiatry placed. Return to office for new or worsening symptoms, or if symptoms persist.  -     predniSONE (DELTASONE) 20 MG tablet; Take 2 tablets (40 mg total) by mouth daily with breakfast for 3 days. -     Ambulatory referral to Podiatry  The patient indicates understanding of these issues and agrees with the plan.  Gwenlyn Perking, FNP

## 2022-07-15 LAB — CBC WITH DIFFERENTIAL/PLATELET
Basophils Absolute: 0.1 10*3/uL (ref 0.0–0.2)
Basos: 1 %
EOS (ABSOLUTE): 0.1 10*3/uL (ref 0.0–0.4)
Eos: 2 %
Hematocrit: 36.9 % (ref 34.0–46.6)
Hemoglobin: 12.8 g/dL (ref 11.1–15.9)
Immature Grans (Abs): 0 10*3/uL (ref 0.0–0.1)
Immature Granulocytes: 0 %
Lymphocytes Absolute: 1.4 10*3/uL (ref 0.7–3.1)
Lymphs: 22 %
MCH: 31.5 pg (ref 26.6–33.0)
MCHC: 34.7 g/dL (ref 31.5–35.7)
MCV: 91 fL (ref 79–97)
Monocytes Absolute: 0.3 10*3/uL (ref 0.1–0.9)
Monocytes: 5 %
Neutrophils Absolute: 4.3 10*3/uL (ref 1.4–7.0)
Neutrophils: 70 %
Platelets: 276 10*3/uL (ref 150–450)
RBC: 4.06 x10E6/uL (ref 3.77–5.28)
RDW: 12.6 % (ref 11.7–15.4)
WBC: 6.1 10*3/uL (ref 3.4–10.8)

## 2022-07-15 LAB — VITAMIN B12: Vitamin B-12: 276 pg/mL (ref 232–1245)

## 2022-07-15 LAB — CMP14+EGFR
ALT: 22 IU/L (ref 0–32)
AST: 21 IU/L (ref 0–40)
Albumin/Globulin Ratio: 1.5 (ref 1.2–2.2)
Albumin: 4.3 g/dL (ref 3.8–4.9)
Alkaline Phosphatase: 112 IU/L (ref 44–121)
BUN/Creatinine Ratio: 15 (ref 9–23)
BUN: 13 mg/dL (ref 6–24)
Bilirubin Total: 0.4 mg/dL (ref 0.0–1.2)
CO2: 21 mmol/L (ref 20–29)
Calcium: 10.3 mg/dL — ABNORMAL HIGH (ref 8.7–10.2)
Chloride: 106 mmol/L (ref 96–106)
Creatinine, Ser: 0.84 mg/dL (ref 0.57–1.00)
Globulin, Total: 2.9 g/dL (ref 1.5–4.5)
Glucose: 90 mg/dL (ref 70–99)
Potassium: 4.4 mmol/L (ref 3.5–5.2)
Sodium: 141 mmol/L (ref 134–144)
Total Protein: 7.2 g/dL (ref 6.0–8.5)
eGFR: 83 mL/min/{1.73_m2} (ref >59)

## 2022-07-15 LAB — LIPID PANEL
Chol/HDL Ratio: 3.8 ratio (ref 0.0–4.4)
Cholesterol, Total: 192 mg/dL (ref 100–199)
HDL: 51 mg/dL (ref >39)
LDL Chol Calc (NIH): 117 mg/dL — ABNORMAL HIGH (ref 0–99)
Triglycerides: 134 mg/dL (ref 0–149)
VLDL Cholesterol Cal: 24 mg/dL (ref 5–40)

## 2022-07-15 LAB — VITAMIN D 25 HYDROXY (VIT D DEFICIENCY, FRACTURES): Vit D, 25-Hydroxy: 12.7 ng/mL — ABNORMAL LOW (ref 30.0–100.0)

## 2022-07-15 LAB — THYROID PANEL WITH TSH
Free Thyroxine Index: 1.9 (ref 1.2–4.9)
T3 Uptake Ratio: 26 % (ref 24–39)
T4, Total: 7.3 ug/dL (ref 4.5–12.0)
TSH: 0.431 u[IU]/mL — ABNORMAL LOW (ref 0.450–4.500)

## 2022-07-15 MED ORDER — VITAMIN D (ERGOCALCIFEROL) 1.25 MG (50000 UNIT) PO CAPS: 50000.0000 [IU] | ORAL_CAPSULE | ORAL | 3 refills | Status: DC

## 2022-07-15 NOTE — Addendum Note (Signed)
Addended by: Baruch Gouty on: 07/15/2022 10:58 AM   Modules accepted: Orders

## 2022-07-22 ENCOUNTER — Ambulatory Visit: Payer: PRIVATE HEALTH INSURANCE | Admitting: Family Medicine

## 2022-08-02 DIAGNOSIS — M79672 Pain in left foot: Secondary | ICD-10-CM | POA: Diagnosis not present

## 2022-08-02 DIAGNOSIS — M779 Enthesopathy, unspecified: Secondary | ICD-10-CM | POA: Diagnosis not present

## 2022-08-05 ENCOUNTER — Telehealth (INDEPENDENT_AMBULATORY_CARE_PROVIDER_SITE_OTHER): Payer: PRIVATE HEALTH INSURANCE | Admitting: Nurse Practitioner

## 2022-08-05 ENCOUNTER — Encounter: Payer: Self-pay | Admitting: Nurse Practitioner

## 2022-08-05 DIAGNOSIS — J40 Bronchitis, not specified as acute or chronic: Secondary | ICD-10-CM | POA: Diagnosis not present

## 2022-08-05 MED ORDER — HYDROCODONE BIT-HOMATROP MBR 5-1.5 MG/5ML PO SOLN: 5.0000 mL | Freq: Four times a day (QID) | ORAL | 0 refills | Status: DC | PRN

## 2022-08-05 MED ORDER — PREDNISONE 20 MG PO TABS: 40.0000 mg | ORAL_TABLET | Freq: Every day | ORAL | 0 refills | Status: AC

## 2022-08-05 NOTE — Patient Instructions (Signed)
Sandra Carroll, thank you for joining Sandra Pretty, FNP for today's virtual visit.  While this provider is not your primary care provider (PCP), if your PCP is located in our provider database this encounter information will be shared with them immediately following your visit.   [facility] account gives you access to today's visit and all your visits, tests, and labs performed at [facility] " click here if you don't have a [facility] account or go to [email or web address]  Consent: (Patient) Sandra Carroll provided verbal consent for this virtual visit at the beginning of the encounter.  Current Medications:  Current Outpatient Medications:    HYDROcodone bit-homatropine (HYCODAN) 5-1.5 MG/5ML syrup, Take 5 mLs by mouth every 6 (six) hours as needed for cough., Disp: 120 mL, Rfl: 0   predniSONE (DELTASONE) 20 MG tablet, Take 2 tablets (40 mg total) by mouth daily with breakfast for 5 days. 2 po daily for 5 days, Disp: 10 tablet, Rfl: 0   citalopram (CELEXA) 20 MG tablet, Take 1 tablet (20 mg total) by mouth daily., Disp: 90 tablet, Rfl: 2   cyclobenzaprine (FLEXERIL) 10 MG tablet, Take 1 tablet (10 mg total) by mouth 2 (two) times daily as needed for muscle spasms. (Patient not taking: Reported on 07/14/2022), Disp: 60 tablet, Rfl: 2   diclofenac (VOLTAREN) 75 MG EC tablet, Take 1 tablet (75 mg total) by mouth 2 (two) times daily. (Patient not taking: Reported on 07/14/2022), Disp: 180 tablet, Rfl: 1   lisinopril (ZESTRIL) 20 MG tablet, Take 1 tablet (20 mg total) by mouth daily., Disp: 90 tablet, Rfl: 2   simvastatin (ZOCOR) 20 MG tablet, Take 1 tablet (20 mg total) by mouth daily at 6 PM., Disp: 90 tablet, Rfl: 2   Vitamin D, Ergocalciferol, (DRISDOL) 1.25 MG (50000 UNIT) CAPS capsule, Take 1 capsule (50,000 Units total) by mouth every 7 (seven) days., Disp: 5 capsule, Rfl: 3   Medications ordered in this encounter:  Meds ordered this encounter   Medications   predniSONE (DELTASONE) 20 MG tablet    Sig: Take 2 tablets (40 mg total) by mouth daily with breakfast for 5 days. 2 po daily for 5 days    Dispense:  10 tablet    Refill:  0    Order Specific Question:   Supervising Provider    Answer:   Caryl Pina A [id number]   HYDROcodone bit-homatropine (HYCODAN) 5-1.5 MG/5ML syrup    Sig: Take 5 mLs by mouth every 6 (six) hours as needed for cough.    Dispense:  120 mL    Refill:  0    Order Specific Question:   Supervising Provider    Answer:   Caryl Pina A [id number]     *If you need refills on other medications prior to your next appointment, please contact your pharmacy*  Follow-Up: Call back or seek an in-person evaluation if the symptoms worsen or if the condition fails to improve as anticipated.  [facility]  Other Instructions 1. Take meds as prescribed 2. Use a cool mist humidifier especially during the winter months and when heat has been humid. 3. Use saline nose sprays frequently 4. Saline irrigations of the nose can be very helpful if done frequently.  * 4X daily for 1 week*  * Use of a nettie pot can be helpful with this. Follow directions with this* 5. Drink plenty of fluids 6. Keep thermostat turn down low 7.For any cough or congestion-  hycodan 8. For fever or aces or pains- take tylenol or ibuprofen appropriate for age and weight.  * for fevers greater than 101 orally you may alternate ibuprofen and tylenol every  3 hours.      If you have been instructed to have an in-person evaluation today at a local Urgent Care facility, please use the link below. It will take you to a list of all of our available [facility], including address, phone number and hours of operation. Please do not delay care.  [facility]  If you or a family member do not have a primary care provider, use the link below to schedule a visit and establish care. When you  choose a [facility] primary care physician or advanced practice provider, you gain a long-term partner in health. Find a Primary Care Provider  Learn more about [facility]'s in-office and virtual care options: [facility]

## 2022-08-05 NOTE — Progress Notes (Signed)
Virtual Visit Consent   [name], you are scheduled for a virtual visit with Sandra Carroll, [place], a [facility] provider, today.     Just as with appointments in the office, your consent must be obtained to participate.  Your consent will be active for this visit and any virtual visit you may have with one of our providers in the next 365 days.     If you have a MyChart account, a copy of this consent can be sent to you electronically.  All virtual visits are billed to your insurance company just like a traditional visit in the office.    As this is a virtual visit, video technology does not allow for your provider to perform a traditional examination.  This may limit your provider's ability to fully assess your condition.  If your provider identifies any concerns that need to be evaluated in person or the need to arrange testing (such as labs, EKG, etc.), we will make arrangements to do so.     Although advances in technology are sophisticated, we cannot ensure that it will always work on either your end or our end.  If the connection with a video visit is poor, the visit may have to be switched to a telephone visit.  With either a video or telephone visit, we are not always able to ensure that we have a secure connection.     I need to obtain your verbal consent now.   Are you willing to proceed with your visit today? YES   Amore W Schor has provided verbal consent on 08/05/2022 for a virtual visit (video or telephone).   Sandra Hassell Done, FNP   Date: 08/05/2022 10:32 AM   Virtual Visit via Video Note   I, Sandra Carroll, connected with Sandra Carroll ([id number], Jun 22, 1965) on 08/05/22 at  5:00 PM EST by a video-enabled telemedicine application and verified that I am speaking with the correct person using two identifiers.  Location: Patient: Virtual Visit Location Patient: Home Provider: Virtual Visit Location Provider: Mobile   I discussed the  limitations of evaluation and management by telemedicine and the availability of in person appointments. The patient expressed understanding and agreed to proceed.    History of Present Illness: Sandra Carroll is a 57 y.o. who identifies as a female who was assigned female at birth, and is being seen today for cough.  HPI: Cough This is a new problem. The current episode started in the past 7 days. The problem has been waxing and waning. The cough is Non-productive. Pertinent negatives include no chills, ear pain, fever, rhinorrhea or sore throat. Nothing aggravates the symptoms. Treatments tried: nuquil. The treatment provided mild relief.    Review of Systems  Constitutional:  Negative for chills and fever.  HENT:  Negative for ear pain, rhinorrhea and sore throat.   Respiratory:  Positive for cough.     Problems:  Patient Active Problem List   Diagnosis Date Noted   Major depressive disorder, recurrent severe without psychotic features ([place]) 12/09/2020   Hot flashes 12/09/2020   Arthritis 12/09/2020   Vitamin B12 deficiency 07/20/2020   Iron deficiency anemia 07/20/2020   Anxiety 07/05/2019   History of kidney stones 08/28/2018   DDD (degenerative disc disease), lumbar 10/04/2016   Essential hypertension 10/04/2016   Pure hypercholesterolemia 10/04/2016   DUB (dysfunctional uterine bleeding) 10/04/2016   Internal hemorrhoids with other complication [id number]   Constipation 02/16/2013    Allergies: No Known Allergies Medications:  Current Outpatient Medications:    HYDROcodone bit-homatropine (HYCODAN) 5-1.5 MG/5ML syrup, Take 5 mLs by mouth every 6 (six) hours as needed for cough., Disp: 120 mL, Rfl: 0   predniSONE (DELTASONE) 20 MG tablet, Take 2 tablets (40 mg total) by mouth daily with breakfast for 5 days. 2 po daily for 5 days, Disp: 10 tablet, Rfl: 0   citalopram (CELEXA) 20 MG tablet, Take 1 tablet (20 mg total) by mouth daily., Disp: 90 tablet, Rfl: 2    cyclobenzaprine (FLEXERIL) 10 MG tablet, Take 1 tablet (10 mg total) by mouth 2 (two) times daily as needed for muscle spasms. (Patient not taking: Reported on 07/14/2022), Disp: 60 tablet, Rfl: 2   diclofenac (VOLTAREN) 75 MG EC tablet, Take 1 tablet (75 mg total) by mouth 2 (two) times daily. (Patient not taking: Reported on 07/14/2022), Disp: 180 tablet, Rfl: 1   lisinopril (ZESTRIL) 20 MG tablet, Take 1 tablet (20 mg total) by mouth daily., Disp: 90 tablet, Rfl: 2   simvastatin (ZOCOR) 20 MG tablet, Take 1 tablet (20 mg total) by mouth daily at 6 PM., Disp: 90 tablet, Rfl: 2   Vitamin D, Ergocalciferol, (DRISDOL) 1.25 MG (50000 UNIT) CAPS capsule, Take 1 capsule (50,000 Units total) by mouth every 7 (seven) days., Disp: 5 capsule, Rfl: 3  Observations/Objective: Patient is well-developed, well-nourished in no acute distress.  Resting comfortably  at home.  Head is normocephalic, atraumatic.  No labored breathing.  Speech is clear and coherent with logical content.  Patient is alert and oriented at baseline.  Raspy voice Deep cough  Assessment and Plan:  [name] in today with chief complaint of Cough   1. Bronchitis 1. Take meds as prescribed 2. Use a cool mist humidifier especially during the winter months and when heat has been humid. 3. Use saline nose sprays frequently 4. Saline irrigations of the nose can be very helpful if Carroll frequently.  * 4X daily for 1 week*  * Use of a nettie pot can be helpful with this. Follow directions with this* 5. Drink plenty of fluids 6. Keep thermostat turn down low 7.For any cough or congestion- hycodan 8. For fever or aces or pains- take tylenol or ibuprofen appropriate for age and weight.  * for fevers greater than 101 orally you may alternate ibuprofen and tylenol every  3 hours.    - predniSONE (DELTASONE) 20 MG tablet; Take 2 tablets (40 mg total) by mouth daily with breakfast for 5 days. 2 po daily for 5 days  Dispense: 10  tablet; Refill: 0 - HYDROcodone bit-homatropine (HYCODAN) 5-1.5 MG/5ML syrup; Take 5 mLs by mouth every 6 (six) hours as needed for cough.  Dispense: 120 mL; Refill: 0    Follow Up Instructions: I discussed the assessment and treatment plan with the patient. The patient was provided an opportunity to ask questions and all were answered. The patient agreed with the plan and demonstrated an understanding of the instructions.  A copy of instructions were sent to the patient via MyChart.  The patient was advised to call back or seek an in-person evaluation if the symptoms worsen or if the condition fails to improve as anticipated.  Time:  I spent 15 minutes with the patient via telehealth technology discussing the above problems/concerns.    Sandra Hassell Done, FNP

## 2022-08-25 ENCOUNTER — Ambulatory Visit (HOSPITAL_COMMUNITY)
Admission: RE | Admit: 2022-08-25 | Discharge: 2022-08-25 | Disposition: A | Discharge status: Home / Self Care | Payer: 59 | Source: Ambulatory Visit | Attending: Urology | Admitting: Urology

## 2022-08-25 DIAGNOSIS — N281 Cyst of kidney, acquired: Secondary | ICD-10-CM | POA: Diagnosis not present

## 2022-08-25 DIAGNOSIS — N200 Calculus of kidney: Secondary | ICD-10-CM | POA: Diagnosis not present

## 2022-12-30 ENCOUNTER — Ambulatory Visit: Payer: PRIVATE HEALTH INSURANCE | Admitting: Family Medicine

## 2023-01-18 ENCOUNTER — Ambulatory Visit: Payer: PRIVATE HEALTH INSURANCE | Admitting: Family Medicine

## 2023-01-31 ENCOUNTER — Encounter: Payer: Self-pay | Admitting: *Deleted

## 2023-02-03 ENCOUNTER — Encounter: Payer: Self-pay | Admitting: Family Medicine

## 2023-02-03 ENCOUNTER — Ambulatory Visit (INDEPENDENT_AMBULATORY_CARE_PROVIDER_SITE_OTHER): Payer: 59 | Admitting: Family Medicine

## 2023-02-03 VITALS — BP 119/81 | HR 80 | Temp 97.7°F | Ht 62.0 in | Wt 220.2 lb

## 2023-02-03 DIAGNOSIS — M1990 Unspecified osteoarthritis, unspecified site: Secondary | ICD-10-CM | POA: Diagnosis not present

## 2023-02-03 DIAGNOSIS — I10 Essential (primary) hypertension: Secondary | ICD-10-CM | POA: Diagnosis not present

## 2023-02-03 DIAGNOSIS — F419 Anxiety disorder, unspecified: Secondary | ICD-10-CM | POA: Diagnosis not present

## 2023-02-03 DIAGNOSIS — F332 Major depressive disorder, recurrent severe without psychotic features: Secondary | ICD-10-CM | POA: Diagnosis not present

## 2023-02-03 DIAGNOSIS — R7989 Other specified abnormal findings of blood chemistry: Secondary | ICD-10-CM | POA: Diagnosis not present

## 2023-02-03 DIAGNOSIS — E559 Vitamin D deficiency, unspecified: Secondary | ICD-10-CM

## 2023-02-03 DIAGNOSIS — E538 Deficiency of other specified B group vitamins: Secondary | ICD-10-CM

## 2023-02-03 DIAGNOSIS — E7800 Pure hypercholesterolemia, unspecified: Secondary | ICD-10-CM

## 2023-02-03 MED ORDER — IBUPROFEN 800 MG PO TABS: 800.0000 mg | ORAL_TABLET | Freq: Three times a day (TID) | ORAL | 0 refills | Status: DC | PRN

## 2023-02-03 MED ORDER — LISINOPRIL 20 MG PO TABS: 20.0000 mg | ORAL_TABLET | Freq: Every day | ORAL | 2 refills | Status: DC

## 2023-02-03 MED ORDER — VITAMIN D (ERGOCALCIFEROL) 1.25 MG (50000 UNIT) PO CAPS: 50000.0000 [IU] | ORAL_CAPSULE | ORAL | 3 refills | Status: DC

## 2023-02-03 MED ORDER — CITALOPRAM HYDROBROMIDE 20 MG PO TABS: 20.0000 mg | ORAL_TABLET | Freq: Every day | ORAL | 2 refills | Status: DC

## 2023-02-03 MED ORDER — SIMVASTATIN 20 MG PO TABS: 20.0000 mg | ORAL_TABLET | Freq: Every day | ORAL | 2 refills | Status: DC

## 2023-02-03 NOTE — Progress Notes (Signed)
Subjective:  Patient ID: Sandra Carroll, female    DOB: 05/15/1966, 57 y.o.   MRN: [id number]  Patient Care Team: Sonny Masters, FNP as PCP - General (Family Medicine) Jena Gauss Gerrit Friends, MD as Consulting Physician (Gastroenterology) Gwenlyn Fudge, FNP (Family Medicine)   Chief Complaint:  Medical Management of Chronic Issues (6 month follow up )   HPI: Sandra Carroll is a 57 y.o. female presenting on 02/03/2023 for Medical Management of Chronic Issues (6 month follow up )   1. Essential hypertension Complaint with meds - Yes Current Medications - lisinopril Checking BP at home - no Exercising Regularly - No Watching Salt intake - Yes Pertinent ROS:  Headache - No Fatigue - No Visual Disturbances - No Chest pain - No Dyspnea - No Palpitations - No LE edema - No They report good compliance with medications and can restate their regimen by memory. No medication side effects.  BP Readings from Last 3 Encounters:  02/03/23 119/81  07/14/22 123/86  01/13/22 112/76     2. Pure hypercholesterolemia Compliant with medications - Yes Current medications - simvastatin  Side effects from medications - No Diet - general Exercise - not regular  3. Vitamin B12 deficiency She is not on repletion therapy. Denies paresthesias. No oral lesions.   4. Vitamin D deficiency Pt is not taking oral repletion therapy. Denies bone pain and tenderness, muscle weakness, fracture, and difficulty walking. Lab Results  Component Value Date   VD25OH 12.7 (L) 07/14/2022   Lab Results  Component Value Date   CALCIUM 10.3 (H) 07/14/2022      5. Abnormal TSH Last TSH was low, pt has not had repeat labs. She does complain of hair loss, anxiety, and weight gain.   6. Serum calcium elevated Calcium has been elevated, will check PTH and ionized calcium today  7. Anxiety 8. Major depressive disorder, recurrent severe without psychotic features (HCC) States her Celexa is working well.  Denies SI or HI.    02/03/2023    3:01 PM 07/14/2022    8:58 AM 06/24/2022    1:14 PM 01/13/2022    3:07 PM  GAD 7 : Generalized Anxiety Score  Nervous, Anxious, on Edge 0 0 1 0  Control/stop worrying 0 0 0 0  Worry too much - different things 0 0 0 0  Trouble relaxing 0 3 0 0  Restless 0 3 0 0  Easily annoyed or irritable 0 3 0 0  Afraid - awful might happen 0 0 0 0  Total GAD 7 Score 0 9 1 0  Anxiety Difficulty Not difficult at all Not difficult at all  Not difficult at all       02/03/2023    3:01 PM 07/14/2022    8:57 AM 06/24/2022    1:13 PM 01/13/2022    3:06 PM 08/30/2021   11:08 AM  Depression screen PHQ 2/9  Decreased Interest 1 0 0 1 0  Down, Depressed, Hopeless 0 0 1 0 0  PHQ - 2 Score 1 0 1 1 0  Altered sleeping 0 3 0 0   Tired, decreased energy 3 3 0 3   Change in appetite 0 0 0 0   Feeling bad or failure about yourself  0 0 0 0   Trouble concentrating 0 0 0 0   Moving slowly or fidgety/restless 0 0 0 0   Suicidal thoughts 0 0 0 0   PHQ-9 Score 4 6 1  4   Difficult doing work/chores Not difficult at all Not difficult at all  Not difficult at all     9. Arthritis She has been taking 800 mg of Motrin as needed for arthritic pain and this works well. Denies new injuries.      Relevant past medical, surgical, family, and social history reviewed and updated as indicated.  Allergies and medications reviewed and updated. Data reviewed: Chart in Epic.   Past Medical History:  Diagnosis Date   Acute pyelonephritis 05/07/2020   Anxiety    Chronic back pain    Complication of anesthesia 1991 and 1998   childbirth --reaction to anesthesia was itching.    DDD (degenerative disc disease)    Depression    Headache    migraines   Hemorrhoids    History of kidney stones    Hyperlipidemia    Hypertension    Iron deficiency anemia 07/20/2020   Kidney stone    PONV (postoperative nausea and vomiting)    Vitamin B12 deficiency 07/20/2020    Past Surgical History:   Procedure Laterality Date   ADENOIDECTOMY  1978   CESAREAN SECTION  1991 1998   pt had itching with 1991 c-section.    CHOLECYSTECTOMY     COLONOSCOPY N/A 03/01/2013   Dr. Jena Gauss- normal rectum except for anal canal/internal hemorrhoidal tags. colonic mucosa appeared normal.   CYSTOSCOPY W/ URETERAL STENT PLACEMENT Left 12/09/2019   Procedure: CYSTOSCOPY WITH RETROGRADE PYELOGRAM/URETERAL STENT PLACEMENT;  Surgeon: Bjorn Pippin, MD;  Location: [facility];  Service: Urology;  Laterality: Left;   CYSTOSCOPY W/ URETERAL STENT PLACEMENT Right 05/07/2020   Procedure: CYSTOSCOPY WITH RETROGRADE PYELOGRAM/URETERAL STENT PLACEMENT;  Surgeon: Jerilee Field, MD;  Location: [facility];  Service: Urology;  Laterality: Right;   CYSTOSCOPY WITH RETROGRADE PYELOGRAM, URETEROSCOPY AND STENT PLACEMENT Right 06/02/2020   Procedure: CYSTOSCOPY WITH RETROGRADE PYELOGRAM, URETEROSCOPY, BASKET EXTRACTION OF STONES AND STENT EXCHANGE;  Surgeon: Malen Gauze, MD;  Location: [facility];  Service: Urology;  Laterality: Right;   CYSTOSCOPY WITH RETROGRADE PYELOGRAM, URETEROSCOPY AND STENT PLACEMENT  03/11/2021   Procedure: CYSTOSCOPY WITH BILATERAL RETROGRADE PYELOGRAM, BILATERAL URETEROSCOPY AND RIGHT URETERAL STENT PLACEMENT;  Surgeon: Malen Gauze, MD;  Location: [facility];  Service: Urology;;   CYSTOSCOPY/URETEROSCOPY/HOLMIUM LASER/STENT PLACEMENT Left 12/17/2019   Procedure: LEFT URETEROSCOPY/STENT EXCHANGE/ EXTRACTION OF LEFT URETERAL CALCULI;  Surgeon: Bjorn Pippin, MD;  Location: [facility];  Service: Urology;  Laterality: Left;   KNEE ARTHROSCOPY Right 2003   LITHOTRIPSY     LUMBAR LAMINECTOMY/DECOMPRESSION MICRODISCECTOMY  03/02/2012   Procedure: LUMBAR LAMINECTOMY/DECOMPRESSION MICRODISCECTOMY 1 LEVEL;  Surgeon: Carmela Hurt, MD;  Location: [facility];  Service: Neurosurgery;  Laterality: Left;  LEFT Lumbar Five-Sacral One Laminectomy for Decompression    STONE EXTRACTION WITH BASKET  03/11/2021   Procedure: STONE  EXTRACTION WITH BASKET BILATERAL;  Surgeon: Malen Gauze, MD;  Location: [facility];  Service: Urology;;    Social History   Socioeconomic History   Marital status: Single    Spouse name: Not on file   Number of children: Not on file   Years of education: Not on file   Highest education level: Not on file  Occupational History   Not on file  Tobacco Use   Smoking status: Former    Current packs/day: 0.00    Average packs/day: 0.3 packs/day for 30.0 years (7.5 ttl pk-yrs)    Types: Cigarettes    Start date: 13    Quit date: 2010  Years since quitting: 14.6   Smokeless tobacco: Never  Vaping Use   Vaping status: Never Used  Substance and Sexual Activity   Alcohol use: Yes    Comment: rare   Drug use: Never   Sexual activity: Not Currently    Birth control/protection: Pill  Other Topics Concern   Not on file  Social History Narrative   ** Merged History Encounter **       Social Determinants of Health   Financial Resource Strain: Not on file  Food Insecurity: Not on file  Transportation Needs: Not on file  Physical Activity: Not on file  Stress: Not on file  Social Connections: Not on file  Intimate Partner Violence: Not on file    Outpatient Encounter Medications as of 02/03/2023  Medication Sig   ibuprofen (ADVIL) 800 MG tablet Take 1 tablet (800 mg total) by mouth every 8 (eight) hours as needed for moderate pain or mild pain.   [DISCONTINUED] citalopram (CELEXA) 20 MG tablet Take 1 tablet (20 mg total) by mouth daily.   [DISCONTINUED] HYDROcodone bit-homatropine (HYCODAN) 5-1.5 MG/5ML syrup Take 5 mLs by mouth every 6 (six) hours as needed for cough.   [DISCONTINUED] lisinopril (ZESTRIL) 20 MG tablet Take 1 tablet (20 mg total) by mouth daily.   [DISCONTINUED] simvastatin (ZOCOR) 20 MG tablet Take 1 tablet (20 mg total) by mouth daily at 6 PM.   [DISCONTINUED] Vitamin D, Ergocalciferol, (DRISDOL) 1.25 MG (50000 UNIT) CAPS capsule Take 1 capsule (50,000  Units total) by mouth every 7 (seven) days.   citalopram (CELEXA) 20 MG tablet Take 1 tablet (20 mg total) by mouth daily.   lisinopril (ZESTRIL) 20 MG tablet Take 1 tablet (20 mg total) by mouth daily.   simvastatin (ZOCOR) 20 MG tablet Take 1 tablet (20 mg total) by mouth daily at 6 PM.   Vitamin D, Ergocalciferol, (DRISDOL) 1.25 MG (50000 UNIT) CAPS capsule Take 1 capsule (50,000 Units total) by mouth every 7 (seven) days.   [DISCONTINUED] cyclobenzaprine (FLEXERIL) 10 MG tablet Take 1 tablet (10 mg total) by mouth 2 (two) times daily as needed for muscle spasms. (Patient not taking: Reported on 07/14/2022)   [DISCONTINUED] diclofenac (VOLTAREN) 75 MG EC tablet Take 1 tablet (75 mg total) by mouth 2 (two) times daily. (Patient not taking: Reported on 07/14/2022)   No facility-administered encounter medications on file as of 02/03/2023.    No Known Allergies  Review of Systems  Constitutional:  Positive for activity change, fatigue and unexpected weight change. Negative for appetite change, chills, diaphoresis and fever.  HENT: Negative.    Eyes: Negative.  Negative for photophobia and visual disturbance.  Respiratory:  Negative for cough, chest tightness and shortness of breath.   Cardiovascular:  Negative for chest pain, palpitations and leg swelling.  Gastrointestinal:  Negative for abdominal pain, blood in stool, constipation, diarrhea, nausea and vomiting.  Endocrine: Negative.   Genitourinary:  Negative for decreased urine volume, difficulty urinating, dysuria, frequency and urgency.  Musculoskeletal:  Negative for arthralgias and myalgias.  Skin: Negative.   Allergic/Immunologic: Negative.   Neurological:  Negative for dizziness, tremors, seizures, syncope, facial asymmetry, speech difficulty, weakness, light-headedness, numbness and headaches.  Hematological: Negative.   Psychiatric/Behavioral:  Negative for confusion, hallucinations, sleep disturbance and suicidal ideas.   All  other systems reviewed and are negative.       Objective:  BP 119/81   Pulse 80   Temp 97.7 F (36.5 C) (Temporal)   Ht 5\' 2"  (1.575 m)  Wt 220 lb 3.2 oz (99.9 kg)   LMP 10/17/2019 (Exact Date)   SpO2 96%   BMI 40.28 kg/m    Wt Readings from Last 3 Encounters:  02/03/23 220 lb 3.2 oz (99.9 kg)  07/14/22 219 lb 2 oz (99.4 kg)  01/13/22 212 lb 3.2 oz (96.3 kg)    Physical Exam Vitals and nursing note reviewed.  Constitutional:      General: She is not in acute distress.    Appearance: Normal appearance. She is well-developed and well-groomed. She is morbidly obese. She is not ill-appearing, toxic-appearing or diaphoretic.  HENT:     Head: Normocephalic and atraumatic.     Jaw: There is normal jaw occlusion.     Right Ear: Hearing normal.     Left Ear: Hearing normal.     Nose: Nose normal.     Mouth/Throat:     Lips: Pink.     Mouth: Mucous membranes are moist.     Pharynx: Oropharynx is clear. Uvula midline.  Eyes:     General: Lids are normal.     Extraocular Movements: Extraocular movements intact.     Conjunctiva/sclera: Conjunctivae normal.     Pupils: Pupils are equal, round, and reactive to light.  Neck:     Thyroid: No thyroid mass, thyromegaly or thyroid tenderness.     Vascular: No carotid bruit or JVD.     Trachea: Trachea and phonation normal.  Cardiovascular:     Rate and Rhythm: Normal rate and regular rhythm.     Chest Wall: PMI is not displaced.     Pulses: Normal pulses.     Heart sounds: Normal heart sounds. No murmur heard.    No friction rub. No gallop.  Pulmonary:     Effort: Pulmonary effort is normal. No respiratory distress.     Breath sounds: Normal breath sounds. No wheezing.  Abdominal:     General: Bowel sounds are normal. There is no distension or abdominal bruit.     Palpations: Abdomen is soft. There is no hepatomegaly or splenomegaly.     Tenderness: There is no abdominal tenderness. There is no right CVA tenderness or left CVA  tenderness.     Hernia: No hernia is present.  Musculoskeletal:        General: Normal range of motion.     Cervical back: Normal range of motion and neck supple.     Right lower leg: No edema.     Left lower leg: No edema.  Lymphadenopathy:     Cervical: No cervical adenopathy.  Skin:    General: Skin is warm and dry.     Capillary Refill: Capillary refill takes less than 2 seconds.     Coloration: Skin is not cyanotic, jaundiced or pale.     Findings: No rash.  Neurological:     General: No focal deficit present.     Mental Status: She is alert and oriented to person, place, and time.     Sensory: Sensation is intact.     Motor: Motor function is intact.     Coordination: Coordination is intact.     Gait: Gait is intact.     Deep Tendon Reflexes: Reflexes are normal and symmetric.  Psychiatric:        Attention and Perception: Attention and perception normal.        Mood and Affect: Mood and affect normal.        Speech: Speech normal.  Behavior: Behavior normal. Behavior is cooperative.        Thought Content: Thought content normal.        Cognition and Memory: Cognition and memory normal.        Judgment: Judgment normal.     Results for orders placed or performed in visit on 07/14/22  Vitamin B12  Result Value Ref Range   Vitamin B-12 276 232 - 1,245 pg/mL  VITAMIN D 25 Hydroxy (Vit-D Deficiency, Fractures)  Result Value Ref Range   Vit D, 25-Hydroxy 12.7 (L) 30.0 - 100.0 ng/mL  Thyroid Panel With TSH  Result Value Ref Range   TSH 0.431 (L) 0.450 - 4.500 uIU/mL   T4, Total 7.3 4.5 - 12.0 ug/dL   T3 Uptake Ratio 26 24 - 39 %   Free Thyroxine Index 1.9 1.2 - 4.9  Lipid panel  Result Value Ref Range   Cholesterol, Total 192 100 - 199 mg/dL   Triglycerides 935 0 - 149 mg/dL   HDL 51 >70 mg/dL   VLDL Cholesterol Cal 24 5 - 40 mg/dL   LDL Chol Calc (NIH) 177 (H) 0 - 99 mg/dL   Chol/HDL Ratio 3.8 0.0 - 4.4 ratio  CMP14+EGFR  Result Value Ref Range    Glucose 90 70 - 99 mg/dL   BUN 13 6 - 24 mg/dL   Creatinine, Ser 9.39 0.57 - 1.00 mg/dL   eGFR 83 >03 ES/PQZ/3.00   BUN/Creatinine Ratio 15 9 - 23   Sodium 141 134 - 144 mmol/L   Potassium 4.4 3.5 - 5.2 mmol/L   Chloride 106 96 - 106 mmol/L   CO2 21 20 - 29 mmol/L   Calcium 10.3 (H) 8.7 - 10.2 mg/dL   Total Protein 7.2 6.0 - 8.5 g/dL   Albumin 4.3 3.8 - 4.9 g/dL   Globulin, Total 2.9 1.5 - 4.5 g/dL   Albumin/Globulin Ratio 1.5 1.2 - 2.2   Bilirubin Total 0.4 0.0 - 1.2 mg/dL   Alkaline Phosphatase 112 44 - 121 IU/L   AST 21 0 - 40 IU/L   ALT 22 0 - 32 IU/L  CBC with Differential/Platelet  Result Value Ref Range   WBC 6.1 3.4 - 10.8 x10E3/uL   RBC 4.06 3.77 - 5.28 x10E6/uL   Hemoglobin 12.8 11.1 - 15.9 g/dL   Hematocrit 76.2 26.3 - 46.6 %   MCV 91 79 - 97 fL   MCH 31.5 26.6 - 33.0 pg   MCHC 34.7 31.5 - 35.7 g/dL   RDW 33.5 45.6 - 25.6 %   Platelets 276 150 - 450 x10E3/uL   Neutrophils 70 Not Estab. %   Lymphs 22 Not Estab. %   Monocytes 5 Not Estab. %   Eos 2 Not Estab. %   Basos 1 Not Estab. %   Neutrophils Absolute 4.3 1.4 - 7.0 x10E3/uL   Lymphocytes Absolute 1.4 0.7 - 3.1 x10E3/uL   Monocytes Absolute 0.3 0.1 - 0.9 x10E3/uL   EOS (ABSOLUTE) 0.1 0.0 - 0.4 x10E3/uL   Basophils Absolute 0.1 0.0 - 0.2 x10E3/uL   Immature Granulocytes 0 Not Estab. %   Immature Grans (Abs) 0.0 0.0 - 0.1 x10E3/uL       Pertinent labs & imaging results that were available during my care of the patient were reviewed by me and considered in my medical decision making.  Assessment & Plan:  Merida was seen today for medical management of chronic issues.  Diagnoses and all orders for this visit:  Essential hypertension BP well  controlled. Changes were not made in regimen today. Goal BP is 130/80. Pt aware to report any persistent high or low readings. DASH diet and exercise encouraged. Exercise at least 150 minutes per week and increase as tolerated. Goal BMI > 25. Stress management  encouraged. Avoid nicotine and tobacco product use. Avoid excessive alcohol and NSAID's. Avoid more than 2000 mg of sodium daily. Medications as prescribed. Follow up as scheduled.  -     CMP14+EGFR -     Anemia Profile B -     Lipid panel -     Thyroid Panel With TSH -     lisinopril (ZESTRIL) 20 MG tablet; Take 1 tablet (20 mg total) by mouth daily.  Pure hypercholesterolemia Diet encouraged - increase intake of fresh fruits and vegetables, increase intake of lean proteins. Bake, broil, or grill foods. Avoid fried, greasy, and fatty foods. Avoid fast foods. Increase intake of fiber-rich whole grains. Exercise encouraged - at least 150 minutes per week and advance as tolerated.  Goal BMI < 25. Continue medications as prescribed. Follow up in 3-6 months as discussed.  -     Lipid panel -     simvastatin (ZOCOR) 20 MG tablet; Take 1 tablet (20 mg total) by mouth daily at 6 PM.  Vitamin B12 deficiency Will recheck labs today and start repletion therapy if warranted.  -     Anemia Profile B  Vitamin D deficiency Labs pending. Continue repletion therapy. If indicated, will change repletion dosage. Eat foods rich in Vit D including milk, orange juice, yogurt with vitamin D added, salmon or mackerel, canned tuna fish, cereals with vitamin D added, and cod liver oil. Get out in the sun but make sure to wear at least SPF 30 sunscreen.  -     VITAMIN D 25 Hydroxy (Vit-D Deficiency, Fractures) -     Vitamin D, Ergocalciferol, (DRISDOL) 1.25 MG (50000 UNIT) CAPS capsule; Take 1 capsule (50,000 Units total) by mouth every 7 (seven) days.  Abnormal TSH Will repeat labs today and treat if warranted.  -     Thyroid Panel With TSH  Serum calcium elevated Will repeat labs today and refer to endocrinology if warranted.  -     Parathyroid hormone, intact (no Ca) -     Calcium, ionized  Anxiety Major depressive disorder, recurrent severe without psychotic features (HCC) Doing well on below, will  continue.  -     citalopram (CELEXA) 20 MG tablet; Take 1 tablet (20 mg total) by mouth daily.  Arthritis Can take below as needed for pain. No more than twice daily.  -     ibuprofen (ADVIL) 800 MG tablet; Take 1 tablet (800 mg total) by mouth every 8 (eight) hours as needed for moderate pain or mild pain.     Continue all other maintenance medications.  Follow up plan: Return in about 3 months (around 05/06/2023), or if symptoms worsen or fail to improve, for CPE.   Continue healthy lifestyle choices, including diet (rich in fruits, vegetables, and lean proteins, and low in salt and simple carbohydrates) and exercise (at least 30 minutes of moderate physical activity daily).   The above assessment and management plan was discussed with the patient. The patient verbalized understanding of and has agreed to the management plan. Patient is aware to call the clinic if they develop any new symptoms or if symptoms persist or worsen. Patient is aware when to return to the clinic for a follow-up visit. Patient educated  on when it is appropriate to go to the emergency department.   Kari Baars, FNP-C [facility] [phone]

## 2023-02-05 LAB — CMP14+EGFR
ALT: 14 IU/L (ref 0–32)
AST: 23 IU/L (ref 0–40)
Albumin: 4.4 g/dL (ref 3.8–4.9)
Alkaline Phosphatase: 109 IU/L (ref 44–121)
BUN/Creatinine Ratio: 22 (ref 9–23)
BUN: 20 mg/dL (ref 6–24)
Bilirubin Total: 0.2 mg/dL (ref 0.0–1.2)
CO2: 23 mmol/L (ref 20–29)
Calcium: 10.4 mg/dL — ABNORMAL HIGH (ref 8.7–10.2)
Chloride: 103 mmol/L (ref 96–106)
Creatinine, Ser: 0.91 mg/dL (ref 0.57–1.00)
Globulin, Total: 2.8 g/dL (ref 1.5–4.5)
Glucose: 81 mg/dL (ref 70–99)
Potassium: 4.4 mmol/L (ref 3.5–5.2)
Sodium: 141 mmol/L (ref 134–144)
Total Protein: 7.2 g/dL (ref 6.0–8.5)
eGFR: 75 mL/min/{1.73_m2} (ref >59)

## 2023-02-05 LAB — THYROID PANEL WITH TSH
Free Thyroxine Index: 1.8 (ref 1.2–4.9)
T3 Uptake Ratio: 27 % (ref 24–39)
T4, Total: 6.7 ug/dL (ref 4.5–12.0)
TSH: 0.867 u[IU]/mL (ref 0.450–4.500)

## 2023-02-05 LAB — ANEMIA PROFILE B
Basophils Absolute: 0.1 10*3/uL (ref 0.0–0.2)
Basos: 1 %
EOS (ABSOLUTE): 0.2 10*3/uL (ref 0.0–0.4)
Eos: 2 %
Ferritin: 88 ng/mL (ref 15–150)
Folate: 5.8 ng/mL (ref >3.0)
Hematocrit: 37.2 % (ref 34.0–46.6)
Hemoglobin: 12.7 g/dL (ref 11.1–15.9)
Immature Grans (Abs): 0 10*3/uL (ref 0.0–0.1)
Immature Granulocytes: 0 %
Iron Saturation: 18 % (ref 15–55)
Iron: 57 ug/dL (ref 27–159)
Lymphocytes Absolute: 1.9 10*3/uL (ref 0.7–3.1)
Lymphs: 28 %
MCH: 31.9 pg (ref 26.6–33.0)
MCHC: 34.1 g/dL (ref 31.5–35.7)
MCV: 94 fL (ref 79–97)
Monocytes Absolute: 0.5 10*3/uL (ref 0.1–0.9)
Monocytes: 7 %
Neutrophils Absolute: 4.4 10*3/uL (ref 1.4–7.0)
Neutrophils: 62 %
Platelets: 259 10*3/uL (ref 150–450)
RBC: 3.98 x10E6/uL (ref 3.77–5.28)
RDW: 13.3 % (ref 11.7–15.4)
Retic Ct Pct: 1.3 % (ref 0.6–2.6)
Total Iron Binding Capacity: 325 ug/dL (ref 250–450)
UIBC: 268 ug/dL (ref 131–425)
Vitamin B-12: 274 pg/mL (ref 232–1245)
WBC: 7 10*3/uL (ref 3.4–10.8)

## 2023-02-05 LAB — CALCIUM, IONIZED: Calcium, Ion: 5.4 mg/dL (ref 4.5–5.6)

## 2023-02-05 LAB — LIPID PANEL
Chol/HDL Ratio: 3.6 ratio (ref 0.0–4.4)
Cholesterol, Total: 185 mg/dL (ref 100–199)
HDL: 51 mg/dL (ref >39)
LDL Chol Calc (NIH): 101 mg/dL — ABNORMAL HIGH (ref 0–99)
Triglycerides: 192 mg/dL — ABNORMAL HIGH (ref 0–149)
VLDL Cholesterol Cal: 33 mg/dL (ref 5–40)

## 2023-02-05 LAB — PARATHYROID HORMONE, INTACT (NO CA): PTH: 42 pg/mL (ref 15–65)

## 2023-02-05 LAB — VITAMIN D 25 HYDROXY (VIT D DEFICIENCY, FRACTURES): Vit D, 25-Hydroxy: 22 ng/mL — ABNORMAL LOW (ref 30.0–100.0)

## 2023-03-06 ENCOUNTER — Other Ambulatory Visit: Payer: Self-pay | Admitting: Family Medicine

## 2023-03-06 DIAGNOSIS — M1990 Unspecified osteoarthritis, unspecified site: Secondary | ICD-10-CM

## 2023-03-24 DIAGNOSIS — W1800XA Striking against unspecified object with subsequent fall, initial encounter: Secondary | ICD-10-CM | POA: Diagnosis not present

## 2023-03-24 DIAGNOSIS — M79632 Pain in left forearm: Secondary | ICD-10-CM | POA: Diagnosis not present

## 2023-03-24 DIAGNOSIS — Z23 Encounter for immunization: Secondary | ICD-10-CM | POA: Diagnosis not present

## 2023-03-24 DIAGNOSIS — S51812A Laceration without foreign body of left forearm, initial encounter: Secondary | ICD-10-CM | POA: Diagnosis not present

## 2023-04-01 DIAGNOSIS — M25511 Pain in right shoulder: Secondary | ICD-10-CM | POA: Diagnosis not present

## 2023-04-05 ENCOUNTER — Encounter: Payer: Self-pay | Admitting: *Deleted

## 2023-05-12 ENCOUNTER — Encounter: Payer: PRIVATE HEALTH INSURANCE | Admitting: Family Medicine

## 2023-05-12 ENCOUNTER — Telehealth: Payer: Self-pay | Admitting: *Deleted

## 2023-05-12 NOTE — Telephone Encounter (Signed)
Spoke with patient, she had to cancel appointment today and needed to reschedule.  She spoke with [facility] and they had her scheduled out to April.  Cancelled appointment in April and rescheduled in December.

## 2023-05-12 NOTE — Telephone Encounter (Signed)
Copied from CRM [id number]. Topic: Appointments - Appointment Cancel/Reschedule >> May 12, 2023 12:22 PM Alvino Blood C wrote: Patient/patient representative is calling to cancel or reschedule an appointment. PT has Physical scheduled for April 2025 but needs meds before appt.

## 2023-05-23 ENCOUNTER — Ambulatory Visit: Payer: PRIVATE HEALTH INSURANCE | Admitting: Family Medicine

## 2023-06-06 ENCOUNTER — Other Ambulatory Visit: Payer: Self-pay | Admitting: Family Medicine

## 2023-06-06 DIAGNOSIS — M1990 Unspecified osteoarthritis, unspecified site: Secondary | ICD-10-CM

## 2023-06-30 ENCOUNTER — Ambulatory Visit: Payer: PRIVATE HEALTH INSURANCE | Admitting: Family Medicine

## 2023-07-27 ENCOUNTER — Encounter: Payer: Self-pay | Admitting: Family Medicine

## 2023-07-27 ENCOUNTER — Ambulatory Visit (INDEPENDENT_AMBULATORY_CARE_PROVIDER_SITE_OTHER): Payer: 59 | Admitting: Family Medicine

## 2023-07-27 VITALS — BP 127/83 | HR 76 | Temp 97.0°F | Ht 62.0 in | Wt 221.6 lb

## 2023-07-27 DIAGNOSIS — E538 Deficiency of other specified B group vitamins: Secondary | ICD-10-CM | POA: Diagnosis not present

## 2023-07-27 DIAGNOSIS — E7800 Pure hypercholesterolemia, unspecified: Secondary | ICD-10-CM

## 2023-07-27 DIAGNOSIS — E559 Vitamin D deficiency, unspecified: Secondary | ICD-10-CM

## 2023-07-27 DIAGNOSIS — M1990 Unspecified osteoarthritis, unspecified site: Secondary | ICD-10-CM | POA: Diagnosis not present

## 2023-07-27 DIAGNOSIS — D500 Iron deficiency anemia secondary to blood loss (chronic): Secondary | ICD-10-CM | POA: Diagnosis not present

## 2023-07-27 DIAGNOSIS — I10 Essential (primary) hypertension: Secondary | ICD-10-CM

## 2023-07-27 MED ORDER — DICLOFENAC SODIUM 50 MG PO TBEC: 50.0000 mg | DELAYED_RELEASE_TABLET | Freq: Two times a day (BID) | ORAL | 3 refills | Status: DC

## 2023-07-27 MED ORDER — VITAMIN D (ERGOCALCIFEROL) 1.25 MG (50000 UNIT) PO CAPS: 50000.0000 [IU] | ORAL_CAPSULE | ORAL | 3 refills | Status: DC

## 2023-07-27 NOTE — Progress Notes (Signed)
 Subjective:  Patient ID: Sandra Carroll, female    DOB: 1966/05/01, 58 y.o.   MRN: [id number]  Patient Care Team: Severa Rock HERO, FNP as PCP - General (Family Medicine) Shaaron Lamar HERO, MD as Consulting Physician (Gastroenterology) Merlynn Niki FALCON, FNP (Family Medicine)   Chief Complaint:  Arthritis (Medication refill )   HPI: Sandra Carroll is a 58 y.o. female presenting on 07/27/2023 for Arthritis (Medication refill )   Discussed the use of AI scribe software for clinical note transcription with the patient, who gave verbal consent to proceed.  History of Present Illness   Sandra Carroll is a 58 year old female who presents with shoulder and collarbone pain following falls.  She has been experiencing shoulder and collarbone pain following two falls, the first in September and the second two months prior. The pain is particularly noticeable during activities that require lifting her arms, such as moving furniture or putting on a coat. She describes the pain as 'real tight' and it restricts her arm movement.  During the falls, she caught herself with her arms, which she believes contributed to the soreness in her shoulders and collarbone. After the second fall, she received stitches and visited urgent care, where x-rays were performed. The pain began approximately two months ago and has persisted since then.  She is currently taking diclofenac  (Voltaren ) once daily for pain relief, although it is prescribed to be taken twice daily. She has not been taking ibuprofen  recently. She has completed a course of vitamin D  and is awaiting a refill, with instructions to take it once a week for three months before switching to an over-the-counter vitamin D3 supplement.  No chest pain, leg swelling, or shortness of breath.          Relevant past medical, surgical, family, and social history reviewed and updated as indicated.  Allergies and medications reviewed and updated. Data  reviewed: Chart in Epic.   Past Medical History:  Diagnosis Date   Acute pyelonephritis 05/07/2020   Anxiety    Chronic back pain    Complication of anesthesia 1991 and 1998   childbirth --reaction to anesthesia was itching.    DDD (degenerative disc disease)    Depression    Headache    migraines   Hemorrhoids    History of kidney stones    Hyperlipidemia    Hypertension    Iron deficiency anemia 07/20/2020   Kidney stone    PONV (postoperative nausea and vomiting)    Vitamin B12 deficiency 07/20/2020    Past Surgical History:  Procedure Laterality Date   ADENOIDECTOMY  1978   CESAREAN SECTION  1991 1998   pt had itching with 1991 c-section.    CHOLECYSTECTOMY     COLONOSCOPY N/A 03/01/2013   Dr. Shaaron- normal rectum except for anal canal/internal hemorrhoidal tags. colonic mucosa appeared normal.   CYSTOSCOPY W/ URETERAL STENT PLACEMENT Left 12/09/2019   Procedure: CYSTOSCOPY WITH RETROGRADE PYELOGRAM/URETERAL STENT PLACEMENT;  Surgeon: Watt Rush, MD;  Location: [facility];  Service: Urology;  Laterality: Left;   CYSTOSCOPY W/ URETERAL STENT PLACEMENT Right 05/07/2020   Procedure: CYSTOSCOPY WITH RETROGRADE PYELOGRAM/URETERAL STENT PLACEMENT;  Surgeon: Nieves Cough, MD;  Location: [facility];  Service: Urology;  Laterality: Right;   CYSTOSCOPY WITH RETROGRADE PYELOGRAM, URETEROSCOPY AND STENT PLACEMENT Right 06/02/2020   Procedure: CYSTOSCOPY WITH RETROGRADE PYELOGRAM, URETEROSCOPY, BASKET EXTRACTION OF STONES AND STENT EXCHANGE;  Surgeon: Sherrilee Belvie CROME, MD;  Location: [facility];  Service: Urology;  Laterality: Right;   CYSTOSCOPY WITH RETROGRADE PYELOGRAM, URETEROSCOPY AND STENT PLACEMENT  03/11/2021   Procedure: CYSTOSCOPY WITH BILATERAL RETROGRADE PYELOGRAM, BILATERAL URETEROSCOPY AND RIGHT URETERAL STENT PLACEMENT;  Surgeon: Sherrilee Belvie CROME, MD;  Location: [facility];  Service: Urology;;   CYSTOSCOPY/URETEROSCOPY/HOLMIUM LASER/STENT PLACEMENT Left 12/17/2019   Procedure:  LEFT URETEROSCOPY/STENT EXCHANGE/ EXTRACTION OF LEFT URETERAL CALCULI;  Surgeon: Watt Rush, MD;  Location: [facility];  Service: Urology;  Laterality: Left;   KNEE ARTHROSCOPY Right 2003   LITHOTRIPSY     LUMBAR LAMINECTOMY/DECOMPRESSION MICRODISCECTOMY  03/02/2012   Procedure: LUMBAR LAMINECTOMY/DECOMPRESSION MICRODISCECTOMY 1 LEVEL;  Surgeon: Rockey CROME Peru, MD;  Location: [facility];  Service: Neurosurgery;  Laterality: Left;  LEFT Lumbar Five-Sacral One Laminectomy for Decompression    STONE EXTRACTION WITH BASKET  03/11/2021   Procedure: STONE EXTRACTION WITH BASKET BILATERAL;  Surgeon: Sherrilee Belvie CROME, MD;  Location: [facility];  Service: Urology;;    Social History   Socioeconomic History   Marital status: Single    Spouse name: Not on file   Number of children: Not on file   Years of education: Not on file   Highest education level: Not on file  Occupational History   Not on file  Tobacco Use   Smoking status: Former    Current packs/day: 0.00    Average packs/day: 0.3 packs/day for 30.0 years (7.5 ttl pk-yrs)    Types: Cigarettes    Start date: 39    Quit date: 2010    Years since quitting: 15.1   Smokeless tobacco: Never  Vaping Use   Vaping status: Never Used  Substance and Sexual Activity   Alcohol use: Yes    Comment: rare   Drug use: Never   Sexual activity: Not Currently    Birth control/protection: Pill  Other Topics Concern   Not on file  Social History Narrative   ** Merged History Encounter **       Social Drivers of Health   Financial Resource Strain: Not on file  Food Insecurity: Not on file  Transportation Needs: Not on file  Physical Activity: Not on file  Stress: Not on file  Social Connections: Not on file  Intimate Partner Violence: Not on file    Outpatient Encounter Medications as of 07/27/2023  Medication Sig   citalopram  (CELEXA ) 20 MG tablet Take 1 tablet (20 mg total) by mouth daily.   lisinopril  (ZESTRIL ) 20 MG tablet Take 1  tablet (20 mg total) by mouth daily.   simvastatin  (ZOCOR ) 20 MG tablet Take 1 tablet (20 mg total) by mouth daily at 6 PM.   [DISCONTINUED] diclofenac  (VOLTAREN ) 50 MG EC tablet Take 50 mg by mouth 2 (two) times daily.   [DISCONTINUED] ibuprofen  (ADVIL ) 800 MG tablet TAKE ONE TABLET EVERY 8 HOURS AS NEEDED FOR PAIN   [DISCONTINUED] Vitamin D , Ergocalciferol , (DRISDOL ) 1.25 MG (50000 UNIT) CAPS capsule Take 1 capsule (50,000 Units total) by mouth every 7 (seven) days.   diclofenac  (VOLTAREN ) 50 MG EC tablet Take 1 tablet (50 mg total) by mouth 2 (two) times daily.   Vitamin D , Ergocalciferol , (DRISDOL ) 1.25 MG (50000 UNIT) CAPS capsule Take 1 capsule (50,000 Units total) by mouth every 7 (seven) days.   No facility-administered encounter medications on file as of 07/27/2023.    No Known Allergies  Pertinent ROS per HPI, otherwise unremarkable      Objective:  BP 127/83   Pulse 76   Temp (!) 97 F (36.1 C)   Ht 5'  2 (1.575 m)   Wt 221 lb 9.6 oz (100.5 kg)   LMP 10/17/2019 (Exact Date)   SpO2 97%   BMI 40.53 kg/m    Wt Readings from Last 3 Encounters:  07/27/23 221 lb 9.6 oz (100.5 kg)  02/03/23 220 lb 3.2 oz (99.9 kg)  07/14/22 219 lb 2 oz (99.4 kg)    Physical Exam Vitals and nursing note reviewed.  Constitutional:      Appearance: Normal appearance. She is well-developed and well-groomed. She is morbidly obese.  HENT:     Head: Normocephalic and atraumatic.     Nose: Nose normal.     Mouth/Throat:     Mouth: Mucous membranes are moist.  Eyes:     Conjunctiva/sclera: Conjunctivae normal.     Pupils: Pupils are equal, round, and reactive to light.  Cardiovascular:     Rate and Rhythm: Normal rate and regular rhythm.     Heart sounds: Normal heart sounds.  Pulmonary:     Effort: Pulmonary effort is normal.     Breath sounds: Normal breath sounds.  Musculoskeletal:     Right shoulder: No swelling, deformity, effusion, laceration, tenderness, bony tenderness or  crepitus. Decreased range of motion. Normal strength. Normal pulse.     Left shoulder: No swelling, deformity, effusion, laceration, tenderness, bony tenderness or crepitus. Decreased range of motion. Normal strength. Normal pulse.     Right upper arm: Normal.     Left upper arm: Normal.     Cervical back: Normal and neck supple.     Right lower leg: No edema.     Left lower leg: No edema.  Skin:    General: Skin is warm and dry.     Capillary Refill: Capillary refill takes less than 2 seconds.  Neurological:     General: No focal deficit present.     Mental Status: She is alert and oriented to person, place, and time.  Psychiatric:        Mood and Affect: Mood normal.        Behavior: Behavior normal. Behavior is cooperative.        Thought Content: Thought content normal.        Judgment: Judgment normal.      Results for orders placed or performed in visit on 02/03/23  CMP14+EGFR   Collection Time: 02/03/23  3:13 PM  Result Value Ref Range   Glucose 81 70 - 99 mg/dL   BUN 20 6 - 24 mg/dL   Creatinine, Ser 9.08 0.57 - 1.00 mg/dL   eGFR 75 >40 fO/fpw/8.26   BUN/Creatinine Ratio 22 9 - 23   Sodium 141 134 - 144 mmol/L   Potassium 4.4 3.5 - 5.2 mmol/L   Chloride 103 96 - 106 mmol/L   CO2 23 20 - 29 mmol/L   Calcium 10.4 (H) 8.7 - 10.2 mg/dL   Total Protein 7.2 6.0 - 8.5 g/dL   Albumin 4.4 3.8 - 4.9 g/dL   Globulin, Total 2.8 1.5 - 4.5 g/dL   Bilirubin Total 0.2 0.0 - 1.2 mg/dL   Alkaline Phosphatase 109 44 - 121 IU/L   AST 23 0 - 40 IU/L   ALT 14 0 - 32 IU/L  Anemia Profile B   Collection Time: 02/03/23  3:13 PM  Result Value Ref Range   Total Iron Binding Capacity 325 250 - 450 ug/dL   UIBC 731 868 - 574 ug/dL   Iron 57 27 - 840 ug/dL   Iron Saturation 18 15 -  55 %   Ferritin 88 15 - 150 ng/mL   Vitamin B-12 274 232 - 1,245 pg/mL   Folate 5.8 >3.0 ng/mL   WBC 7.0 3.4 - 10.8 x10E3/uL   RBC 3.98 3.77 - 5.28 x10E6/uL   Hemoglobin 12.7 11.1 - 15.9 g/dL   Hematocrit  62.7 65.9 - 46.6 %   MCV 94 79 - 97 fL   MCH 31.9 26.6 - 33.0 pg   MCHC 34.1 31.5 - 35.7 g/dL   RDW 86.6 88.2 - 84.5 %   Platelets 259 150 - 450 x10E3/uL   Neutrophils 62 Not Estab. %   Lymphs 28 Not Estab. %   Monocytes 7 Not Estab. %   Eos 2 Not Estab. %   Basos 1 Not Estab. %   Neutrophils Absolute 4.4 1.4 - 7.0 x10E3/uL   Lymphocytes Absolute 1.9 0.7 - 3.1 x10E3/uL   Monocytes Absolute 0.5 0.1 - 0.9 x10E3/uL   EOS (ABSOLUTE) 0.2 0.0 - 0.4 x10E3/uL   Basophils Absolute 0.1 0.0 - 0.2 x10E3/uL   Immature Granulocytes 0 Not Estab. %   Immature Grans (Abs) 0.0 0.0 - 0.1 x10E3/uL   Retic Ct Pct 1.3 0.6 - 2.6 %  Lipid panel   Collection Time: 02/03/23  3:13 PM  Result Value Ref Range   Cholesterol, Total 185 100 - 199 mg/dL   Triglycerides 807 (H) 0 - 149 mg/dL   HDL 51 >60 mg/dL   VLDL Cholesterol Cal 33 5 - 40 mg/dL   LDL Chol Calc (NIH) 898 (H) 0 - 99 mg/dL   Chol/HDL Ratio 3.6 0.0 - 4.4 ratio  Thyroid  Panel With TSH   Collection Time: 02/03/23  3:13 PM  Result Value Ref Range   TSH 0.867 0.450 - 4.500 uIU/mL   T4, Total 6.7 4.5 - 12.0 ug/dL   T3 Uptake Ratio 27 24 - 39 %   Free Thyroxine Index 1.8 1.2 - 4.9  VITAMIN D  25 Hydroxy (Vit-D Deficiency, Fractures)   Collection Time: 02/03/23  3:13 PM  Result Value Ref Range   Vit D, 25-Hydroxy 22.0 (L) 30.0 - 100.0 ng/mL  Parathyroid  hormone, intact (no Ca)   Collection Time: 02/03/23  3:13 PM  Result Value Ref Range   PTH 42 15 - 65 pg/mL  Calcium, ionized   Collection Time: 02/03/23  3:13 PM  Result Value Ref Range   Calcium, Ion 5.4 4.5 - 5.6 mg/dL       Pertinent labs & imaging results that were available during my care of the patient were reviewed by me and considered in my medical decision making.  Assessment & Plan:  Vangie was seen today for arthritis.  Diagnoses and all orders for this visit:  Arthritis -     Anemia Profile B -     CMP14+EGFR -     VITAMIN D  25 Hydroxy (Vit-D Deficiency,  Fractures)  Essential hypertension -     Anemia Profile B -     CMP14+EGFR -     Thyroid  Panel With TSH  Pure hypercholesterolemia -     CMP14+EGFR -     Lipid panel  Iron deficiency anemia due to chronic blood loss -     Anemia Profile B  Vitamin B12 deficiency -     Anemia Profile B  Vitamin D  deficiency -     Vitamin D , Ergocalciferol , (DRISDOL ) 1.25 MG (50000 UNIT) CAPS capsule; Take 1 capsule (50,000 Units total) by mouth every 7 (seven) days. -  CMP14+EGFR -     VITAMIN D  25 Hydroxy (Vit-D Deficiency, Fractures)  Other orders -     diclofenac  (VOLTAREN ) 50 MG EC tablet; Take 1 tablet (50 mg total) by mouth 2 (two) times daily.     Assessment and Plan    Shoulder Pain Intermittent shoulder pain and soreness following two falls, most recently in September. Pain localized to the collarbone area, exacerbated by movements like putting on and taking off a coat. Likely due to costochondritis or musculoskeletal strain from the falls. Discussed that healing may take time and recommended continuing Voltaren  (diclofenac ) for relief. Advised against concurrent use of ibuprofen  to avoid potential adverse effects on the stomach and kidneys. - Take Voltaren  (diclofenac ) 50 mg twice daily for two weeks, then reduce to once daily - Follow up if stiffness does not improve  Medication Refill Requires refills for Voltaren  (diclofenac ) and vitamin D . Reports significant pain relief with Voltaren . Completed previous course of vitamin D  and needs a new prescription. Discussed the importance of continuing vitamin D  supplementation and transitioning to over-the-counter vitamin D3 after the initial three-month course. - Send prescription for Voltaren  (diclofenac ) 50 mg with refills - Send prescription for vitamin D  50,000 IU once a week for three months, then switch to over-the-counter vitamin D3 2000 IU daily  General Health Maintenance Blood pressure is well-controlled. No chest pain, leg  swelling, or shortness of breath. Advised to avoid ibuprofen  while on diclofenac  to prevent stomach and kidney issues. Will check labs for vitamin D , B12, and other relevant markers. - Check labs for vitamin D , B12, and other relevant markers - Avoid ibuprofen  while on diclofenac  to prevent stomach and kidney issues.          Continue all other maintenance medications.  Follow up plan: Return if symptoms worsen or fail to improve.   Continue healthy lifestyle choices, including diet (rich in fruits, vegetables, and lean proteins, and low in salt and simple carbohydrates) and exercise (at least 30 minutes of moderate physical activity daily).   The above assessment and management plan was discussed with the patient. The patient verbalized understanding of and has agreed to the management plan. Patient is aware to call the clinic if they develop any new symptoms or if symptoms persist or worsen. Patient is aware when to return to the clinic for a follow-up visit. Patient educated on when it is appropriate to go to the emergency department.   Rosaline Bruns, FNP-C [facility] [phone]

## 2023-07-28 ENCOUNTER — Encounter: Payer: Self-pay | Admitting: Family Medicine

## 2023-07-28 LAB — VITAMIN D 25 HYDROXY (VIT D DEFICIENCY, FRACTURES): Vit D, 25-Hydroxy: 28.1 ng/mL — ABNORMAL LOW (ref 30.0–100.0)

## 2023-07-28 LAB — ANEMIA PROFILE B
Basophils Absolute: 0 10*3/uL (ref 0.0–0.2)
Basos: 1 %
EOS (ABSOLUTE): 0.1 10*3/uL (ref 0.0–0.4)
Eos: 2 %
Ferritin: 82 ng/mL (ref 15–150)
Folate: 7.5 ng/mL (ref >3.0)
Hematocrit: 39.5 % (ref 34.0–46.6)
Hemoglobin: 13.3 g/dL (ref 11.1–15.9)
Immature Grans (Abs): 0 10*3/uL (ref 0.0–0.1)
Immature Granulocytes: 0 %
Iron Saturation: 20 % (ref 15–55)
Iron: 69 ug/dL (ref 27–159)
Lymphocytes Absolute: 2.1 10*3/uL (ref 0.7–3.1)
Lymphs: 33 %
MCH: 31.3 pg (ref 26.6–33.0)
MCHC: 33.7 g/dL (ref 31.5–35.7)
MCV: 93 fL (ref 79–97)
Monocytes Absolute: 0.5 10*3/uL (ref 0.1–0.9)
Monocytes: 8 %
Neutrophils Absolute: 3.5 10*3/uL (ref 1.4–7.0)
Neutrophils: 56 %
Platelets: 269 10*3/uL (ref 150–450)
RBC: 4.25 x10E6/uL (ref 3.77–5.28)
RDW: 13 % (ref 11.7–15.4)
Retic Ct Pct: 1.2 % (ref 0.6–2.6)
Total Iron Binding Capacity: 342 ug/dL (ref 250–450)
UIBC: 273 ug/dL (ref 131–425)
Vitamin B-12: 317 pg/mL (ref 232–1245)
WBC: 6.2 10*3/uL (ref 3.4–10.8)

## 2023-07-28 LAB — LIPID PANEL
Chol/HDL Ratio: 3.9 {ratio} (ref 0.0–4.4)
Cholesterol, Total: 215 mg/dL — ABNORMAL HIGH (ref 100–199)
HDL: 55 mg/dL (ref >39)
LDL Chol Calc (NIH): 137 mg/dL — ABNORMAL HIGH (ref 0–99)
Triglycerides: 127 mg/dL (ref 0–149)
VLDL Cholesterol Cal: 23 mg/dL (ref 5–40)

## 2023-07-28 LAB — THYROID PANEL WITH TSH
Free Thyroxine Index: 1.7 (ref 1.2–4.9)
T3 Uptake Ratio: 25 % (ref 24–39)
T4, Total: 6.9 ug/dL (ref 4.5–12.0)
TSH: 0.985 u[IU]/mL (ref 0.450–4.500)

## 2023-07-28 LAB — CMP14+EGFR
ALT: 16 [IU]/L (ref 0–32)
AST: 22 [IU]/L (ref 0–40)
Albumin: 4.4 g/dL (ref 3.8–4.9)
Alkaline Phosphatase: 121 [IU]/L (ref 44–121)
BUN/Creatinine Ratio: 19 (ref 9–23)
BUN: 18 mg/dL (ref 6–24)
Bilirubin Total: 0.3 mg/dL (ref 0.0–1.2)
CO2: 24 mmol/L (ref 20–29)
Calcium: 10.4 mg/dL — ABNORMAL HIGH (ref 8.7–10.2)
Chloride: 103 mmol/L (ref 96–106)
Creatinine, Ser: 0.97 mg/dL (ref 0.57–1.00)
Globulin, Total: 3 g/dL (ref 1.5–4.5)
Glucose: 84 mg/dL (ref 70–99)
Potassium: 4.3 mmol/L (ref 3.5–5.2)
Sodium: 139 mmol/L (ref 134–144)
Total Protein: 7.4 g/dL (ref 6.0–8.5)
eGFR: 69 mL/min/{1.73_m2} (ref >59)

## 2023-08-15 ENCOUNTER — Telehealth: Payer: Self-pay | Admitting: Urology

## 2023-08-15 DIAGNOSIS — N200 Calculus of kidney: Secondary | ICD-10-CM

## 2023-08-15 NOTE — Telephone Encounter (Signed)
 Patient is starting to have pain on left side again, she is going to set up appt with Sandra Carroll can she get an order put in for an Ultrasound so that she can have it done before she come in ??

## 2023-08-18 ENCOUNTER — Ambulatory Visit: Payer: 59 | Admitting: Urology

## 2023-08-25 ENCOUNTER — Ambulatory Visit (HOSPITAL_COMMUNITY): Admission: RE | Admit: 2023-08-25 | Payer: 59 | Source: Ambulatory Visit

## 2023-09-06 ENCOUNTER — Ambulatory Visit: Payer: 59 | Admitting: Urology

## 2023-09-29 ENCOUNTER — Encounter: Payer: PRIVATE HEALTH INSURANCE | Admitting: Family Medicine

## 2023-10-20 ENCOUNTER — Ambulatory Visit (HOSPITAL_COMMUNITY)
Admission: RE | Admit: 2023-10-20 | Discharge: 2023-10-20 | Disposition: A | Discharge status: Home / Self Care | Source: Ambulatory Visit | Attending: Urology | Admitting: Urology

## 2023-10-20 DIAGNOSIS — N200 Calculus of kidney: Secondary | ICD-10-CM | POA: Diagnosis not present

## 2023-12-27 ENCOUNTER — Telehealth: Payer: Self-pay | Admitting: Family Medicine

## 2023-12-27 NOTE — Telephone Encounter (Signed)
 Mammo apt scheduled  Copied from CRM [id number]. Topic: General - Call Back - No Documentation >> Dec 27, 2023 10:28 AM Sandra Carroll wrote: Reason for CRM: Patient returning call. Requesting call back, [phone].

## 2024-01-05 ENCOUNTER — Encounter: Payer: Self-pay | Admitting: Urology

## 2024-01-05 ENCOUNTER — Ambulatory Visit (INDEPENDENT_AMBULATORY_CARE_PROVIDER_SITE_OTHER): Admitting: Urology

## 2024-01-05 ENCOUNTER — Ambulatory Visit (HOSPITAL_COMMUNITY)
Admission: RE | Admit: 2024-01-05 | Discharge: 2024-01-05 | Disposition: A | Discharge status: Home / Self Care | Source: Ambulatory Visit | Attending: Urology | Admitting: Urology

## 2024-01-05 VITALS — BP 134/80 | HR 84

## 2024-01-05 DIAGNOSIS — N200 Calculus of kidney: Secondary | ICD-10-CM

## 2024-01-05 DIAGNOSIS — I878 Other specified disorders of veins: Secondary | ICD-10-CM | POA: Diagnosis not present

## 2024-01-05 NOTE — Patient Instructions (Signed)
Ureteroscopy  Ureteroscopy is a procedure to check for and treat problems inside part of the urinary tract. In this procedure, a long rigid or flexible tube with a lens and light at the end (ureteroscope) is used to look at the inside of the kidneys and the ureters. The ureters are the tubes that carry urine from the kidneys to the bladder. The ureteroscope is inserted into one or both of the ureters. You may need this procedure if you have frequent urinary tract infections (UTIs), blood in your urine, or a stone in one or both of your ureters. A ureteroscopy can be done: To find the cause of urine blockage in a ureter and to evaluate other abnormalities inside the ureters or kidneys. To remove stones. To remove or treat growths of tissue (polyps), abnormal tissue, and some types of tumors. To remove a tissue sample and check it for disease under a microscope (biopsy). Tell a health care provider about: Any allergies you have. All medicines you are taking, including vitamins, herbs, eye drops, creams, and over-the-counter medicines. Any problems you or family members have had with anesthetic medicines. Any bleeding problems you have. Any surgeries you have had. Any medical conditions you have. Whether you are pregnant or may be pregnant. What are the risks? Your health care provider will talk with you about risks. These may include: Abdominal pain or a burning feeling or pain while urinating. Abnormal bleeding. A UTI. Allergic reactions to medicines. Scarring that narrows the ureter (stricture) or swelling. Creating a hole (perforation) in the ureter. Damage to other structures or organs, such as the part of your body that drains urine from your bladder (urethra), your bladder, or your uterus. What happens before the procedure? When to stop eating and drinking  8 hours before your procedure Stop eating most foods. Do not eat meat, fried foods, or fatty foods. Eat only light foods, such  as toast or crackers. All liquids are okay except energy drinks and alcohol. 6 hours before your procedure Stop eating. Drink only clear liquids, such as water, clear fruit juice, black coffee, plain tea, and sports drinks. Do not drink energy drinks or alcohol. 2 hours before your procedure Stop drinking all liquids. You may be allowed to take medicines with small sips of water. Medicines Ask your health care provider about: Changing or stopping your regular medicines. These include any diabetes medicines or blood thinners you take. Taking medicines such as aspirin and ibuprofen. These medicines can thin your blood. Do not take these medicines unless your health care provider tells you to. Taking over-the-counter medicines, vitamins, herbs, and supplements. General instructions Do not use any products that contain nicotine or tobacco for at least 4 weeks before the procedure. These products include cigarettes, chewing tobacco, and vaping devices, such as e-cigarettes. If you need help quitting, ask your health care provider. If you will be going home right after the procedure, plan to have a responsible adult: Take you home from the hospital or clinic. You will not be allowed to drive. Care for you for the time you are told. Ask your health care provider what steps will be taken to help prevent infection. These may include: Washing skin with a soap that kills germs. Receiving antibiotic medicine. Tests You may have an exam or testing. You may have a urine sample taken to check for infection. What happens during the procedure? An IV will be inserted into one of your veins. You may be given: A sedative. This helps   you relax. Anesthesia. This will: Numb certain areas of your body. Make you fall asleep for surgery. Your urethra will be cleaned with a germ-killing solution. The ureteroscope will be passed through your urethra into your bladder. A salt-water solution will be sent through  the ureteroscope to fill your bladder. This will help the health care provider see the openings of your ureters more clearly. The ureteroscope will be passed into your ureter. If a growth is found, a biopsy may be done. If a stone is found, it may be removed through the ureteroscope, or the stone may be broken up using a laser, shock waves, or electrical energy. In some cases, if the ureter is too small, a tube may be inserted that keeps the ureter open (ureteral stent). The stent may be left in place for 1 or 2 weeks, and then the ureteroscopy procedure will be done again. The scope will be removed, and your bladder will be emptied. The procedure may vary among health care providers and hospitals. What happens after the procedure? Your blood pressure, heart rate, breathing rate, and blood oxygen level will be monitored until you leave the hospital or clinic. It is up to you to get the results of your procedure. Ask your health care provider, or the department that is doing the procedure, when your results will be ready. Summary Ureteroscopy is a procedure used to look at the inside of the kidneys and the ureters. You may need this procedure if you have frequent urinary tract infections (UTIs), blood in your urine, or a stone in one or both of your ureters. Follow instructions from your health care provider about eating and drinking. In some cases, if the ureter is too small, a tube may be inserted that keeps the ureter open (ureteral stent). The stent may be left in place for 1 or 2 weeks to keep the ureter open, and then the ureteroscopy procedure will be done again. This information is not intended to replace advice given to you by your health care provider. Make sure you discuss any questions you have with your health care provider. Document Revised: 05/09/2022 Document Reviewed: 05/09/2022 Elsevier Patient Education  2024 [employer]  

## 2024-01-05 NOTE — Progress Notes (Signed)
 01/05/2024 12:33 PM   Sandra Carroll September 08, 1965 [id number]  Referring provider: Severa Rock HERO, FNP [address] [place],  [place] [address]  Followup nephrolithiasis   HPI: Sandra Carroll is a 57yo here for followup for nephrolithiasis. She underwent renal US  10/2023 and KUB today which showed 2 right 7-38mm renal calculi and a 7mm left renal calculus. She is having intermittent bilateral flank pain.She denies any worsening LUTS   PMH: Past Medical History:  Diagnosis Date   Acute pyelonephritis 05/07/2020   Anxiety    Chronic back pain    Complication of anesthesia 1991 and 1998   childbirth --reaction to anesthesia was itching.    DDD (degenerative disc disease)    Depression    Headache    migraines   Hemorrhoids    History of kidney stones    Hyperlipidemia    Hypertension    Iron deficiency anemia 07/20/2020   Kidney stone    PONV (postoperative nausea and vomiting)    Vitamin B12 deficiency 07/20/2020    Surgical History: Past Surgical History:  Procedure Laterality Date   ADENOIDECTOMY  1978   CESAREAN SECTION  1991 1998   pt had itching with 1991 c-section.    CHOLECYSTECTOMY     COLONOSCOPY N/A 03/01/2013   Dr. Shaaron- normal rectum except for anal canal/internal hemorrhoidal tags. colonic mucosa appeared normal.   CYSTOSCOPY W/ URETERAL STENT PLACEMENT Left 12/09/2019   Procedure: CYSTOSCOPY WITH RETROGRADE PYELOGRAM/URETERAL STENT PLACEMENT;  Surgeon: Watt Rush, MD;  Location: [facility];  Service: Urology;  Laterality: Left;   CYSTOSCOPY W/ URETERAL STENT PLACEMENT Right 05/07/2020   Procedure: CYSTOSCOPY WITH RETROGRADE PYELOGRAM/URETERAL STENT PLACEMENT;  Surgeon: Nieves Cough, MD;  Location: [facility];  Service: Urology;  Laterality: Right;   CYSTOSCOPY WITH RETROGRADE PYELOGRAM, URETEROSCOPY AND STENT PLACEMENT Right 06/02/2020   Procedure: CYSTOSCOPY WITH RETROGRADE PYELOGRAM, URETEROSCOPY, BASKET EXTRACTION OF STONES AND STENT EXCHANGE;  Surgeon:  Sherrilee Belvie CROME, MD;  Location: [facility];  Service: Urology;  Laterality: Right;   CYSTOSCOPY WITH RETROGRADE PYELOGRAM, URETEROSCOPY AND STENT PLACEMENT  03/11/2021   Procedure: CYSTOSCOPY WITH BILATERAL RETROGRADE PYELOGRAM, BILATERAL URETEROSCOPY AND RIGHT URETERAL STENT PLACEMENT;  Surgeon: Sherrilee Belvie CROME, MD;  Location: [facility];  Service: Urology;;   CYSTOSCOPY/URETEROSCOPY/HOLMIUM LASER/STENT PLACEMENT Left 12/17/2019   Procedure: LEFT URETEROSCOPY/STENT EXCHANGE/ EXTRACTION OF LEFT URETERAL CALCULI;  Surgeon: Watt Rush, MD;  Location: [facility];  Service: Urology;  Laterality: Left;   KNEE ARTHROSCOPY Right 2003   LITHOTRIPSY     LUMBAR LAMINECTOMY/DECOMPRESSION MICRODISCECTOMY  03/02/2012   Procedure: LUMBAR LAMINECTOMY/DECOMPRESSION MICRODISCECTOMY 1 LEVEL;  Surgeon: Rockey CROME Peru, MD;  Location: [facility];  Service: Neurosurgery;  Laterality: Left;  LEFT Lumbar Five-Sacral One Laminectomy for Decompression    STONE EXTRACTION WITH BASKET  03/11/2021   Procedure: STONE EXTRACTION WITH BASKET BILATERAL;  Surgeon: Sherrilee Belvie CROME, MD;  Location: [facility];  Service: Urology;;    Home Medications:  Allergies as of 01/05/2024   No Known Allergies      Medication List        Accurate as of January 05, 2024 12:33 PM. If you have any questions, ask your nurse or doctor.          citalopram  20 MG tablet Commonly known as: CeleXA  Take 1 tablet (20 mg total) by mouth daily.   diclofenac  50 MG EC tablet Commonly known as: VOLTAREN  Take 1 tablet (50 mg total) by mouth 2 (two) times daily.   lisinopril  20  MG tablet Commonly known as: ZESTRIL  Take 1 tablet (20 mg total) by mouth daily.   simvastatin  20 MG tablet Commonly known as: ZOCOR  Take 1 tablet (20 mg total) by mouth daily at 6 PM.   Vitamin D  (Ergocalciferol ) 1.25 MG (50000 UNIT) Caps capsule Commonly known as: DRISDOL  Take 1 capsule (50,000 Units total) by mouth every 7 (seven) days.        Allergies: No  Known Allergies  Family History: Family History  Problem Relation Age of Onset   Hypertension Mother    Cancer Father    Hypertension Father    Colon cancer Neg Hx     Social History:  reports that she quit smoking about 15 years ago. Her smoking use included cigarettes. She started smoking about 45 years ago. She has a 7.5 pack-year smoking history. She has never used smokeless tobacco. She reports current alcohol use. She reports that she does not use drugs.  ROS: All other review of systems were reviewed and are negative except what is noted above in HPI  Physical Exam: BP 134/80   Pulse 84   LMP 10/17/2019 (Exact Date)   Constitutional:  Alert and oriented, No acute distress. HEENT: [place] AT, moist mucus membranes.  Trachea midline, no masses. Cardiovascular: No clubbing, cyanosis, or edema. Respiratory: Normal respiratory effort, no increased work of breathing. GI: Abdomen is soft, nontender, nondistended, no abdominal masses GU: No CVA tenderness.  Lymph: No cervical or inguinal lymphadenopathy. Skin: No rashes, bruises or suspicious lesions. Neurologic: Grossly intact, no focal deficits, moving all 4 extremities. Psychiatric: Normal mood and affect.  Laboratory Data: Lab Results  Component Value Date   WBC 6.2 07/27/2023   HGB 13.3 07/27/2023   HCT 39.5 07/27/2023   MCV 93 07/27/2023   PLT 269 07/27/2023    Lab Results  Component Value Date   CREATININE 0.97 07/27/2023    No results found for: PSA  No results found for: TESTOSTERONE  No results found for: HGBA1C  Urinalysis    Component Value Date/Time   COLORURINE YELLOW 05/07/2020 1233   APPEARANCEUR Clear 10/06/2021 1455   LABSPEC 1.013 05/07/2020 1233   PHURINE 6.0 05/07/2020 1233   GLUCOSEU Negative 10/06/2021 1455   HGBUR SMALL (A) 05/07/2020 1233   BILIRUBINUR Negative 10/06/2021 1455   KETONESUR NEGATIVE 05/07/2020 1233   PROTEINUR Trace (A) 10/06/2021 1455   PROTEINUR 100 (A)  05/07/2020 1233   UROBILINOGEN 0.2 07/25/2014 0215   NITRITE Negative 10/06/2021 1455   NITRITE NEGATIVE 05/07/2020 1233   LEUKOCYTESUR Negative 10/06/2021 1455   LEUKOCYTESUR LARGE (A) 05/07/2020 1233    Lab Results  Component Value Date   LABMICR See below: 10/06/2021   WBCUA 0-5 10/06/2021   LABEPIT 0-10 10/06/2021   MUCUS Present 10/06/2021   BACTERIA Few (A) 10/06/2021    Pertinent Imaging: KUb today: Images reviewed and discussed with the patient  Results for orders placed during the hospital encounter of 12/31/20  Abdomen 1 view (KUB)  Narrative CLINICAL DATA:  Nephrolithiasis.  EXAM: ABDOMEN - 1 VIEW  COMPARISON:  None.  FINDINGS: Multiple calcifications in the pelvis. Both kidneys are largely obscured by bowel contents. No definite renal stones are seen on this study. No ureteral stones identified. No other abnormalities.  IMPRESSION: The kidneys are largely obscured by bowel contents but no stones are seen.  Calcifications in the pelvis are nonspecific but may all be phleboliths. No other evidence of ureteral stone.   Electronically Signed By: Alm Pouch III  M.D On: 01/03/2021 19:43  No results found for this or any previous visit.  No results found for this or any previous visit.  No results found for this or any previous visit.  Results for orders placed in visit on 08/15/23  Ultrasound renal complete  Narrative CLINICAL DATA:  Nephrolithiasis  EXAM: RENAL / URINARY TRACT ULTRASOUND COMPLETE  COMPARISON:  Renal ultrasound August 25, 2022  FINDINGS: Right Kidney:  Renal measurements: 11.6 x 6.1 x 5.9 cm = volume: 218 mL. Normal renal cortical thickness and echogenicity. No hydronephrosis. Multiple stones including a 0.7 cm stone lower pole. There is a 1.0 cm stone midpole.  Left Kidney:  Renal measurements: 11.1 x 5.2 x 4.9 cm = volume: 145 mL. Normal renal cortical thickness and echogenicity. No hydronephrosis. 9 mm stone  lower pole. Additional probable stones are demonstrated.  Bladder:  Appears normal for degree of bladder distention.  Other:  None.  IMPRESSION: Bilateral nephrolithiasis. No hydronephrosis.   Electronically Signed By: Bard Moats M.D. On: 10/20/2023 13:32  No results found for this or any previous visit.  No results found for this or any previous visit.  Results for orders placed during the hospital encounter of 05/07/20  CT Renal Stone Study  Narrative CLINICAL DATA:  Right-sided lower back pain  EXAM: CT ABDOMEN AND PELVIS WITHOUT CONTRAST  TECHNIQUE: Multidetector CT imaging of the abdomen and pelvis was performed following the standard protocol without IV contrast.  COMPARISON:  None.  FINDINGS: Lower chest: The visualized heart size within normal limits. No pericardial fluid/thickening.  No hiatal hernia.  The visualized portions of the lungs are clear.  Hepatobiliary: Although limited due to the lack of intravenous contrast, normal in appearance without gross focal abnormality. The patient is status post cholecystectomy. No biliary ductal dilation.  Pancreas:  Unremarkable.  No surrounding inflammatory changes.  Spleen: Normal in size. Although limited due to the lack of intravenous contrast, normal in appearance.  Adrenals/Urinary Tract: Both adrenal glands appear normal. Moderate right pelvicaliectasis and ureterectasis is seen down the level of the distal ureter where there is several ureteral calculi 1 measuring 3 mm and 1 measuring 4 mm. There is right-sided perinephric and proximal periureteral stranding changes. There is a tiny punctate calcification in the posterior right bladder. There is also a a 3 mm calculus seen in the mid pole the right kidney. No left-sided renal or collecting system calculi are noted.  Stomach/Bowel: The stomach, small bowel, and colon are normal in appearance. No inflammatory changes or obstructive  findings. Scattered colonic diverticula are noted.  Vascular/Lymphatic: There are no enlarged abdominal or pelvic lymph nodes. Scattered aortic atherosclerotic calcifications are seen without aneurysmal dilatation.  Reproductive: The uterus and adnexa are unremarkable.  Other: Bilateral fat containing inguinal hernias are noted.  Musculoskeletal: No acute or significant osseous findings.  IMPRESSION: Moderate right hydronephrosis to the distal ureter where there is several calcified ureteral calculi measuring 3 mm and 4 mm. There is also punctate calcification in the posterior right bladder.  Right-sided perinephric and periureteral stranding which could be from the recently passed stone versus pyelonephritis.  Nonobstructing right renal calculus.   Electronically Signed By: Merlyn Rajas M.D. On: 05/07/2020 16:09   Assessment & Plan:    1. Nephrolithiasis (Primary) -We discussed the management of kidney stones. These options include observation, ureteroscopy, shockwave lithotripsy (ESWL) and percutaneous nephrolithotomy (PCNL). We discussed which options are relevant to the patient's stone(s). We discussed the natural history of kidney stones as well  as the complications of untreated stones and the impact on quality of life without treatment as well as with each of the above listed treatments. We also discussed the efficacy of each treatment in its ability to clear the stone burden. With any of these management options I discussed the signs and symptoms of infection and the need for emergent treatment should these be experienced. For each option we discussed the ability of each procedure to clear the patient of their stone burden.   For observation I described the risks which include but are not limited to silent renal damage, life-threatening infection, need for emergent surgery, failure to pass stone and pain.   For ureteroscopy I described the risks which include bleeding,  infection, damage to contiguous structures, positioning injury, ureteral stricture, ureteral avulsion, ureteral injury, need for prolonged ureteral stent, inability to perform ureteroscopy, need for an interval procedure, inability to clear stone burden, stent discomfort/pain, heart attack, stroke, pulmonary embolus and the inherent risks with general anesthesia.   For shockwave lithotripsy I described the risks which include arrhythmia, kidney contusion, kidney hemorrhage, need for transfusion, pain, inability to adequately break up stone, inability to pass stone fragments, Steinstrasse, infection associated with obstructing stones, need for alternate surgical procedure, need for repeat shockwave lithotripsy, MI, CVA, PE and the inherent risks with anesthesia/conscious sedation.   For PCNL I described the risks including positioning injury, pneumothorax, hydrothorax, need for chest tube, inability to clear stone burden, renal laceration, arterial venous fistula or malformation, need for embolization of kidney, loss of kidney or renal function, need for repeat procedure, need for prolonged nephrostomy tube, ureteral avulsion, MI, CVA, PE and the inherent risks of general anesthesia.   - The patient would like to proceed with right ureteroscopic stone extraction followup by left ureterowcopic stone extraction.   - DG Abd 1 View; Future   No follow-ups on file.  Belvie Clara, MD  [facility] [place]

## 2024-01-16 ENCOUNTER — Other Ambulatory Visit: Payer: Self-pay | Admitting: Nurse Practitioner

## 2024-01-16 DIAGNOSIS — Z1231 Encounter for screening mammogram for malignant neoplasm of breast: Secondary | ICD-10-CM

## 2024-01-17 ENCOUNTER — Inpatient Hospital Stay: Admission: RE | Admit: 2024-01-17 | Source: Ambulatory Visit

## 2024-02-12 ENCOUNTER — Encounter (HOSPITAL_COMMUNITY)
Admission: RE | Admit: 2024-02-12 | Discharge: 2024-02-12 | Disposition: A | Discharge status: Home / Self Care | Source: Ambulatory Visit | Attending: Urology

## 2024-02-12 VITALS — BP 105/75 | HR 85 | Resp 18 | Ht 62.0 in | Wt 221.6 lb

## 2024-02-12 DIAGNOSIS — D500 Iron deficiency anemia secondary to blood loss (chronic): Secondary | ICD-10-CM

## 2024-02-12 DIAGNOSIS — Z01818 Encounter for other preprocedural examination: Secondary | ICD-10-CM | POA: Diagnosis not present

## 2024-02-12 DIAGNOSIS — I10 Essential (primary) hypertension: Secondary | ICD-10-CM

## 2024-02-12 LAB — CBC WITH DIFFERENTIAL/PLATELET
Abs Immature Granulocytes: 0.02 K/uL (ref 0.00–0.07)
Basophils Absolute: 0.1 K/uL (ref 0.0–0.1)
Basophils Relative: 1 %
Eosinophils Absolute: 0.1 K/uL (ref 0.0–0.5)
Eosinophils Relative: 2 %
HCT: 38 % (ref 36.0–46.0)
Hemoglobin: 12.6 g/dL (ref 12.0–15.0)
Immature Granulocytes: 0 %
Lymphocytes Relative: 27 %
Lymphs Abs: 2.3 K/uL (ref 0.7–4.0)
MCH: 31.8 pg (ref 26.0–34.0)
MCHC: 33.2 g/dL (ref 30.0–36.0)
MCV: 96 fL (ref 80.0–100.0)
Monocytes Absolute: 0.6 K/uL (ref 0.1–1.0)
Monocytes Relative: 7 %
Neutro Abs: 5.5 K/uL (ref 1.7–7.7)
Neutrophils Relative %: 63 %
Platelets: 276 K/uL (ref 150–400)
RBC: 3.96 MIL/uL (ref 3.87–5.11)
RDW: 13.6 % (ref 11.5–15.5)
WBC: 8.6 K/uL (ref 4.0–10.5)
nRBC: 0 % (ref 0.0–0.2)

## 2024-02-12 NOTE — Patient Instructions (Signed)
 Sandra Carroll  02/12/2024     @PREFPERIOPPHARMACY @   Your procedure is scheduled on 02/15/2024.  Report to [facility] at 1030 A.M.  Call this number if you have problems the morning of surgery:  [phone]  If you experience any cold or flu symptoms such as cough, fever, chills, shortness of breath, etc. between now and your scheduled surgery, please notify us  at the above number.   Remember:  Do not eat or drink after midnight.  You may drink clear liquids until 0700 .  Clear liquids allowed are:                    Water , Carbonated beverages (diabetics please choose diet or no sugar options), and Black Coffee Only (No creamer, milk or cream, including half & half and powdered creamer)    Take these medicines the morning of surgery with A SIP OF WATER   Celexa     Do not wear jewelry, make-up or nail polish, including gel polish,  artificial nails, or any other type of covering on natural nails (fingers and  toes).  Do not wear lotions, powders, or perfumes, or deodorant.  Do not shave 48 hours prior to surgery.  Men may shave face and neck.General Anesthesia, Adult, Care After The following information offers guidance on how to care for yourself after your procedure. Your health care provider may also give you more specific instructions. If you have problems or questions, contact your health care provider. What can I expect after the procedure? After the procedure, it is common for people to: Have pain or discomfort at the IV site. Have nausea or vomiting. Have a sore throat or hoarseness. Have trouble concentrating. Feel cold or chills. Feel weak, sleepy, or tired (fatigue). Have soreness and body aches. These can affect parts of the body that were not involved in surgery. Follow these instructions at home: For the time period you were told by your health care provider:  Rest. Do not participate in activities where you could fall or become injured. Do not drive or use  machinery. Do not drink alcohol. Do not take sleeping pills or medicines that cause drowsiness. Do not make important decisions or sign legal documents. Do not take care of children on your own. General instructions Drink enough fluid to keep your urine pale yellow. If you have sleep apnea, surgery and certain medicines can increase your risk for breathing problems. Follow instructions from your health care provider about wearing your sleep device: Anytime you are sleeping, including during daytime naps. While taking prescription pain medicines, sleeping medicines, or medicines that make you drowsy. Return to your normal activities as told by your health care provider. Ask your health care provider what activities are safe for you. Take over-the-counter and prescription medicines only as told by your health care provider. Do not use any products that contain nicotine or tobacco. These products include cigarettes, chewing tobacco, and vaping devices, such as e-cigarettes. These can delay incision healing after surgery. If you need help quitting, ask your health care provider. Contact a health care provider if: You have nausea or vomiting that does not get better with medicine. You vomit every time you eat or drink. You have pain that does not get better with medicine. You cannot urinate or have bloody urine. You develop a skin rash. You have a fever. Get help right away if: You have trouble breathing. You have chest pain. You vomit blood. These symptoms may be an  emergency. Get help right away. Call 911. Do not wait to see if the symptoms will go away. Do not drive yourself to the hospital. Summary After the procedure, it is common to have a sore throat, hoarseness, nausea, vomiting, or to feel weak, sleepy, or fatigue. For the time period you were told by your health care provider, do not drive or use machinery. Get help right away if you have difficulty breathing, have chest pain, or  vomit blood. These symptoms may be an emergency. This information is not intended to replace advice given to you by your health care provider. Make sure you discuss any questions you have with your health care provider. Document Revised: 09/03/2021 Document Reviewed: 09/03/2021 Elsevier Patient Education  2024 Else[employer].  Do not bring valuables to the hospital.  [facility] is not responsible for any belongings or valuables.  Contacts, dentures or bridgework may not be worn into surgery.  Leave your suitcase in the car.  After surgery it may be brought to your room.  For patients admitted to the hospital, discharge time will be determined by your treatment team.  Patients discharged the day of surgery will not be allowed to drive home.   Name and phone number of your driver:   [facility] [phone] Special instructions:    Please read over the following fact sheets that you were given. Care and Recovery After Surgery

## 2024-02-13 ENCOUNTER — Ambulatory Visit: Payer: Self-pay | Admitting: Urology

## 2024-02-15 ENCOUNTER — Ambulatory Visit (HOSPITAL_BASED_OUTPATIENT_CLINIC_OR_DEPARTMENT_OTHER): Admitting: Certified Registered"

## 2024-02-15 ENCOUNTER — Ambulatory Visit (HOSPITAL_COMMUNITY)
Admission: RE | Admit: 2024-02-15 | Discharge: 2024-02-15 | Disposition: A | Discharge status: Home / Self Care | Attending: Urology | Admitting: Urology

## 2024-02-15 ENCOUNTER — Ambulatory Visit (HOSPITAL_COMMUNITY): Admitting: Certified Registered"

## 2024-02-15 ENCOUNTER — Other Ambulatory Visit: Payer: Self-pay | Admitting: Family Medicine

## 2024-02-15 ENCOUNTER — Encounter (HOSPITAL_COMMUNITY): Payer: Self-pay | Admitting: Urology

## 2024-02-15 ENCOUNTER — Encounter (HOSPITAL_COMMUNITY)
Admission: RE | Disposition: A | Discharge status: Home / Self Care | Payer: Self-pay | Source: Home / Self Care | Attending: Urology

## 2024-02-15 ENCOUNTER — Ambulatory Visit (HOSPITAL_COMMUNITY)

## 2024-02-15 ENCOUNTER — Other Ambulatory Visit: Payer: Self-pay

## 2024-02-15 DIAGNOSIS — N200 Calculus of kidney: Secondary | ICD-10-CM | POA: Diagnosis not present

## 2024-02-15 DIAGNOSIS — E7800 Pure hypercholesterolemia, unspecified: Secondary | ICD-10-CM

## 2024-02-15 DIAGNOSIS — Z87891 Personal history of nicotine dependence: Secondary | ICD-10-CM | POA: Diagnosis not present

## 2024-02-15 DIAGNOSIS — F418 Other specified anxiety disorders: Secondary | ICD-10-CM | POA: Diagnosis not present

## 2024-02-15 DIAGNOSIS — Z8249 Family history of ischemic heart disease and other diseases of the circulatory system: Secondary | ICD-10-CM | POA: Diagnosis not present

## 2024-02-15 DIAGNOSIS — I10 Essential (primary) hypertension: Secondary | ICD-10-CM | POA: Insufficient documentation

## 2024-02-15 DIAGNOSIS — F419 Anxiety disorder, unspecified: Secondary | ICD-10-CM

## 2024-02-15 DIAGNOSIS — F332 Major depressive disorder, recurrent severe without psychotic features: Secondary | ICD-10-CM

## 2024-02-15 HISTORY — PX: HOLMIUM LASER APPLICATION: SHX5852

## 2024-02-15 HISTORY — PX: CYSTOSCOPY/URETEROSCOPY/HOLMIUM LASER/STENT PLACEMENT: SHX6546

## 2024-02-15 HISTORY — PX: CYSTOSCOPY/RETROGRADE/URETEROSCOPY/STONE EXTRACTION WITH BASKET: SHX5317

## 2024-02-15 SURGERY — CYSTOSCOPY/URETEROSCOPY/HOLMIUM LASER/STENT PLACEMENT
Anesthesia: General | Site: Ureter | Laterality: Right

## 2024-02-15 MED ORDER — TAMSULOSIN HCL 0.4 MG PO CAPS
0.4000 mg | ORAL_CAPSULE | Freq: Every day | ORAL | 0 refills | Status: AC
Start: 2024-02-15 — End: ?

## 2024-02-15 MED ORDER — EPHEDRINE SULFATE (PRESSORS) 50 MG/ML IJ SOLN
INTRAMUSCULAR | Status: DC | PRN
  Administered 2024-02-15: 10 mg via INTRAVENOUS

## 2024-02-15 MED ORDER — PROPOFOL 10 MG/ML IV BOLUS
INTRAVENOUS | Status: DC | PRN
  Administered 2024-02-15: 200 mg via INTRAVENOUS

## 2024-02-15 MED ORDER — DEXAMETHASONE SODIUM PHOSPHATE 10 MG/ML IJ SOLN
INTRAMUSCULAR | Status: AC
  Filled 2024-02-15: qty 1

## 2024-02-15 MED ORDER — SODIUM CHLORIDE 0.9 % IR SOLN
Status: DC | PRN
  Administered 2024-02-15: 3000 mL

## 2024-02-15 MED ORDER — CHLORHEXIDINE GLUCONATE 0.12 % MT SOLN
15.0000 mL | Freq: Once | OROMUCOSAL | Status: DC
  Filled 2024-02-15: qty 15

## 2024-02-15 MED ORDER — EPHEDRINE 5 MG/ML INJ
INTRAVENOUS | Status: AC
  Filled 2024-02-15: qty 5

## 2024-02-15 MED ORDER — OXYCODONE HCL 5 MG/5ML PO SOLN: 5.0000 mg | Freq: Once | ORAL | Status: DC | PRN

## 2024-02-15 MED ORDER — FENTANYL CITRATE (PF) 100 MCG/2ML IJ SOLN
INTRAMUSCULAR | Status: DC | PRN
  Administered 2024-02-15: 25 ug via INTRAVENOUS
  Administered 2024-02-15: 50 ug via INTRAVENOUS
  Administered 2024-02-15: 25 ug via INTRAVENOUS

## 2024-02-15 MED ORDER — DEXAMETHASONE SODIUM PHOSPHATE 4 MG/ML IJ SOLN
INTRAMUSCULAR | Status: DC | PRN
  Administered 2024-02-15: 4 mg via INTRAVENOUS

## 2024-02-15 MED ORDER — OXYCODONE HCL 5 MG PO TABS: 5.0000 mg | ORAL_TABLET | Freq: Once | ORAL | Status: DC | PRN

## 2024-02-15 MED ORDER — ONDANSETRON HCL 4 MG PO TABS: 4.0000 mg | ORAL_TABLET | Freq: Every day | ORAL | 1 refills | Status: DC | PRN

## 2024-02-15 MED ORDER — ONDANSETRON HCL 4 MG/2ML IJ SOLN
INTRAMUSCULAR | Status: DC | PRN
  Administered 2024-02-15: 4 mg via INTRAVENOUS

## 2024-02-15 MED ORDER — LIDOCAINE 2% (20 MG/ML) 5 ML SYRINGE
INTRAMUSCULAR | Status: AC
  Filled 2024-02-15: qty 5

## 2024-02-15 MED ORDER — OXYCODONE-ACETAMINOPHEN 5-325 MG PO TABS: 1.0000 | ORAL_TABLET | ORAL | 0 refills | Status: DC | PRN

## 2024-02-15 MED ORDER — LIDOCAINE HCL (CARDIAC) PF 100 MG/5ML IV SOSY
PREFILLED_SYRINGE | INTRAVENOUS | Status: DC | PRN
  Administered 2024-02-15: 100 mg via INTRAVENOUS

## 2024-02-15 MED ORDER — LACTATED RINGERS IV SOLN: INTRAVENOUS | Status: DC | PRN

## 2024-02-15 MED ORDER — CEFAZOLIN SODIUM-DEXTROSE 2-4 GM/100ML-% IV SOLN
2.0000 g | INTRAVENOUS | Status: AC
  Administered 2024-02-15: 2 g via INTRAVENOUS
  Filled 2024-02-15: qty 100

## 2024-02-15 MED ORDER — ONDANSETRON HCL 4 MG/2ML IJ SOLN: 4.0000 mg | Freq: Once | INTRAMUSCULAR | Status: DC | PRN

## 2024-02-15 MED ORDER — WATER FOR IRRIGATION, STERILE IR SOLN
Status: DC | PRN
  Administered 2024-02-15: 1000 mL

## 2024-02-15 MED ORDER — PROPOFOL 10 MG/ML IV BOLUS
INTRAVENOUS | Status: AC
  Filled 2024-02-15: qty 20

## 2024-02-15 MED ORDER — FENTANYL CITRATE PF 50 MCG/ML IJ SOSY: 25.0000 ug | PREFILLED_SYRINGE | INTRAMUSCULAR | Status: DC | PRN

## 2024-02-15 MED ORDER — DIATRIZOATE MEGLUMINE 30 % UR SOLN
URETHRAL | Status: DC | PRN
  Administered 2024-02-15: 5 mL via URETHRAL

## 2024-02-15 MED ORDER — FENTANYL CITRATE (PF) 100 MCG/2ML IJ SOLN
INTRAMUSCULAR | Status: AC
  Filled 2024-02-15: qty 2

## 2024-02-15 MED ORDER — DEXMEDETOMIDINE HCL IN NACL 80 MCG/20ML IV SOLN
INTRAVENOUS | Status: DC | PRN
  Administered 2024-02-15: 12 ug via INTRAVENOUS

## 2024-02-15 SURGICAL SUPPLY — 21 items
BAG DRAIN URO TABLE W/ADPT NS (BAG) ×1 IMPLANT
CLOTH BEACON ORANGE TIMEOUT ST (SAFETY) ×1 IMPLANT
EXTRACTOR STONE NITINOL NGAGE (UROLOGICAL SUPPLIES) ×1 IMPLANT
GLOVE BIO SURGEON STRL SZ8 (GLOVE) ×1 IMPLANT
GLOVE BIOGEL PI IND STRL 7.0 (GLOVE) ×2 IMPLANT
GOWN STRL REUS W/TWL LRG LVL3 (GOWN DISPOSABLE) ×1 IMPLANT
GOWN STRL REUS W/TWL XL LVL3 (GOWN DISPOSABLE) ×1 IMPLANT
GUIDEWIRE ANG ZIPWIRE 038X150 (WIRE) ×1 IMPLANT
GUIDEWIRE STR DUAL SENSOR (WIRE) ×1 IMPLANT
KIT TURNOVER CYSTO (KITS) ×1 IMPLANT
MANIFOLD NEPTUNE II (INSTRUMENTS) ×1 IMPLANT
PACK CYSTO (CUSTOM PROCEDURE TRAY) ×1 IMPLANT
PAD ARMBOARD POSITIONER FOAM (MISCELLANEOUS) ×1 IMPLANT
POSITIONER HEAD 8X9X4 ADT (SOFTGOODS) ×1 IMPLANT
SHEATH URETERAL FLEX 12X35 (SHEATH) IMPLANT
SOL .9 NS 3000ML IRR UROMATIC (IV SOLUTION) ×2 IMPLANT
STENT URET 6FRX26 CONTOUR (STENTS) IMPLANT
SYR 10ML LL (SYRINGE) ×1 IMPLANT
TOWEL OR 17X26 4PK STRL BLUE (TOWEL DISPOSABLE) ×1 IMPLANT
TRACTIP FLEXIVA PULS ID 200XHI (Laser) IMPLANT
WATER STERILE IRR 500ML POUR (IV SOLUTION) ×1 IMPLANT

## 2024-02-15 NOTE — Anesthesia Postprocedure Evaluation (Signed)
 Anesthesia Post Note  Patient: AHMIYAH COIL  Procedure(s) Performed: CYSTOSCOPY/URETEROSCOPY/HOLMIUM LASER/STENT PLACEMENT (Right: Ureter) HOLMIUM LASER APPLICATION (Right: Ureter) CYSTOSCOPY, WITH CALCULUS REMOVAL USING BASKET (Right: Ureter)  Patient location during evaluation: PACU Anesthesia Type: General Level of consciousness: oriented, awake, awake and alert and responds to stimulation Pain management: pain level controlled Vital Signs Assessment: post-procedure vital signs reviewed and stable Respiratory status: spontaneous breathing, respiratory function stable and nonlabored ventilation Cardiovascular status: blood pressure returned to baseline and stable Anesthetic complications: no   No notable events documented.   Last Vitals:  Vitals:   02/15/24 1027  BP: 122/85  Pulse: (!) 56  Resp: 16  Temp: 36.7 C  SpO2: 98%    Last Pain:  Vitals:   02/15/24 1027  TempSrc: Oral  PainSc: 0-No pain                 Adine LITTIE Anger

## 2024-02-15 NOTE — Op Note (Signed)
 Preoperative diagnosis: Right renal stones  Postoperative diagnosis: Same  Procedure: 1 cystoscopy 2. Right retrograde pyelography 3.  Intraoperative fluoroscopy, under one hour, with interpretation 4.  Right ureteroscopic stone manipulation with laser lithotripsy 5.  right 6 x 26 JJ stent placement  Attending: Belvie Standing  Anesthesia: General  Estimated blood loss: None  Drains: Right 6 x 26 JJ ureteral stent with tether  Specimens: stone for analysis  Antibiotics: ancef   Findings: Right Upper, mid and lower pole stones. No hydronephrosis. No masses/lesions in the bladder. Ureteral orifices in normal anatomic location.  Indications: Patient is a 58 year old female with a history of right renal stone and who has persistent right flank pain.  After discussing treatment options, she decided proceed with right ureteroscopic stone manipulation.  Procedure in detail: The patient was brought to the operating room and a brief timeout was done to ensure correct patient, correct procedure, correct site.  General anesthesia was administered patient was placed in dorsal lithotomy position.  Her genitalia was then prepped and draped in usual sterile fashion.  A rigid 22 French cystoscope was passed in the urethra and the bladder.  Bladder was inspected free masses or lesions.  the ureteral orifices were in the normal orthotopic locations.  a 6 french ureteral catheter was then instilled into the right ureteral orifice.  a gentle retrograde was obtained and findings noted above.  we then placed a zip wire through the ureteral catheter and advanced up to the renal pelvis.  we then removed the cystoscope and cannulated the right ureteral orifice with a semirigid ureteroscope.  No stone was found in the ureter. Once we reached the UPJ a sensor wire was advanced in to the renal pelvis. We then removed the ureteroscope and advanced am 12/14 x 35cm access sheath up to the renal pelvis. We then used the  flexible ureteroscope to perform nephroscopy. We encountered the stones in the upper, mid and lower poles. Using a 242nm laser fiber the stone was fragmented and the fragments were removed with a Ngage basket.    once all stone fragments were removed we then removed the access sheath under direct vision and noted no injury to the ureter. We then placed a 6 x 26 double-j ureteral stent over the original zip wire.  We then removed the wire and good coil was noted in the the renal pelvis under fluoroscopy and the bladder under direct vision. the bladder was then drained and this concluded the procedure which was well tolerated by patient.  Complications: None  Condition: Stable, extubated, transferred to PACU  Plan: Patient is to be discharged home as to follow-up in one week. She is to remove her stent in 72 hours by pulling the tether

## 2024-02-15 NOTE — Anesthesia Preprocedure Evaluation (Signed)
 Anesthesia Evaluation  Patient identified by MRN, date of birth, ID band Patient awake    Reviewed: Allergy & Precautions, H&P , NPO status , Patient's Chart, lab work & pertinent test results, reviewed documented beta blocker date and time   History of Anesthesia Complications (+) PONV and history of anesthetic complications  Airway Mallampati: II  TM Distance: >3 FB Neck ROM: full    Dental no notable dental hx.    Pulmonary neg pulmonary ROS, former smoker   Pulmonary exam normal breath sounds clear to auscultation       Cardiovascular Exercise Tolerance: Good hypertension,  Rhythm:regular Rate:Normal     Neuro/Psych  Headaches PSYCHIATRIC DISORDERS Anxiety Depression       GI/Hepatic negative GI ROS, Neg liver ROS,,,  Endo/Other  negative endocrine ROS    Renal/GU Renal disease  negative genitourinary   Musculoskeletal   Abdominal   Peds  Hematology  (+) Blood dyscrasia, anemia   Anesthesia Other Findings   Reproductive/Obstetrics negative OB ROS                              Anesthesia Physical Anesthesia Plan  ASA: 2  Anesthesia Plan: General and General LMA   Post-op Pain Management:    Induction:   PONV Risk Score and Plan: Ondansetron   Airway Management Planned:   Additional Equipment:   Intra-op Plan:   Post-operative Plan:   Informed Consent: I have reviewed the patients History and Physical, chart, labs and discussed the procedure including the risks, benefits and alternatives for the proposed anesthesia with the patient or authorized representative who has indicated his/her understanding and acceptance.     Dental Advisory Given  Plan Discussed with: CRNA  Anesthesia Plan Comments:         Anesthesia Quick Evaluation

## 2024-02-15 NOTE — Transfer of Care (Signed)
 Immediate Anesthesia Transfer of Care Note  Patient: Sandra Carroll  Procedure(s) Performed: CYSTOSCOPY/URETEROSCOPY/HOLMIUM LASER/STENT PLACEMENT (Right: Ureter) HOLMIUM LASER APPLICATION (Right: Ureter) CYSTOSCOPY, WITH CALCULUS REMOVAL USING BASKET (Right: Ureter)  Patient Location: PACU  Anesthesia Type:General  Level of Consciousness: awake, alert , and oriented  Airway & Oxygen Therapy: Patient Spontanous Breathing  Post-op Assessment: Report given to RN and Post -op Vital signs reviewed and stable  Post vital signs: Reviewed and stable  Last Vitals:  Vitals Value Taken Time  BP    Temp    Pulse 72 02/15/24 13:58  Resp 13 02/15/24 13:58  SpO2 95 % 02/15/24 13:58  Vitals shown include unfiled device data.  Last Pain:  Vitals:   02/15/24 1027  TempSrc: Oral  PainSc: 0-No pain         Complications: No notable events documented.

## 2024-02-15 NOTE — Discharge Instructions (Signed)

## 2024-02-15 NOTE — H&P (Signed)
 HPI: Sandra Carroll is a 58yo here for RIGHT URETEROSCOPIC STONE EXTRACTION. She underwent renal US  10/2023 and KUB today which showed 2 right 7-89mm renal calculi and a 7mm left renal calculus. She is having intermittent bilateral flank pain.She denies any worsening LUTS     PMH:     Past Medical History:  Diagnosis Date   Acute pyelonephritis 05/07/2020   Anxiety     Chronic back pain     Complication of anesthesia 1991 and 1998    childbirth --reaction to anesthesia was itching.    DDD (degenerative disc disease)     Depression     Headache      migraines   Hemorrhoids     History of kidney stones     Hyperlipidemia     Hypertension     Iron deficiency anemia 07/20/2020   Kidney stone     PONV (postoperative nausea and vomiting)     Vitamin B12 deficiency 07/20/2020          Surgical History:      Past Surgical History:  Procedure Laterality Date   ADENOIDECTOMY   1978   CESAREAN SECTION   1991 1998    pt had itching with 1991 c-section.    CHOLECYSTECTOMY       COLONOSCOPY N/A 03/01/2013    Dr. Shaaron- normal rectum except for anal canal/internal hemorrhoidal tags. colonic mucosa appeared normal.   CYSTOSCOPY W/ URETERAL STENT PLACEMENT Left 12/09/2019    Procedure: CYSTOSCOPY WITH RETROGRADE PYELOGRAM/URETERAL STENT PLACEMENT;  Surgeon: Watt Rush, MD;  Location: [facility];  Service: Urology;  Laterality: Left;   CYSTOSCOPY W/ URETERAL STENT PLACEMENT Right 05/07/2020    Procedure: CYSTOSCOPY WITH RETROGRADE PYELOGRAM/URETERAL STENT PLACEMENT;  Surgeon: Nieves Cough, MD;  Location: [facility];  Service: Urology;  Laterality: Right;   CYSTOSCOPY WITH RETROGRADE PYELOGRAM, URETEROSCOPY AND STENT PLACEMENT Right 06/02/2020    Procedure: CYSTOSCOPY WITH RETROGRADE PYELOGRAM, URETEROSCOPY, BASKET EXTRACTION OF STONES AND STENT EXCHANGE;  Surgeon: Sherrilee Belvie CROME, MD;  Location: [facility];  Service: Urology;  Laterality: Right;   CYSTOSCOPY WITH RETROGRADE PYELOGRAM, URETEROSCOPY  AND STENT PLACEMENT   03/11/2021    Procedure: CYSTOSCOPY WITH BILATERAL RETROGRADE PYELOGRAM, BILATERAL URETEROSCOPY AND RIGHT URETERAL STENT PLACEMENT;  Surgeon: Sherrilee Belvie CROME, MD;  Location: [facility];  Service: Urology;;   CYSTOSCOPY/URETEROSCOPY/HOLMIUM LASER/STENT PLACEMENT Left 12/17/2019    Procedure: LEFT URETEROSCOPY/STENT EXCHANGE/ EXTRACTION OF LEFT URETERAL CALCULI;  Surgeon: Watt Rush, MD;  Location: [facility];  Service: Urology;  Laterality: Left;   KNEE ARTHROSCOPY Right 2003   LITHOTRIPSY       LUMBAR LAMINECTOMY/DECOMPRESSION MICRODISCECTOMY   03/02/2012    Procedure: LUMBAR LAMINECTOMY/DECOMPRESSION MICRODISCECTOMY 1 LEVEL;  Surgeon: Rockey CROME Peru, MD;  Location: [facility];  Service: Neurosurgery;  Laterality: Left;  LEFT Lumbar Five-Sacral One Laminectomy for Decompression    STONE EXTRACTION WITH BASKET   03/11/2021    Procedure: STONE EXTRACTION WITH BASKET BILATERAL;  Surgeon: Sherrilee Belvie CROME, MD;  Location: [facility];  Service: Urology;;          Home Medications:  Allergies as of 01/05/2024   No Known Allergies         Medication List           Accurate as of January 05, 2024 12:33 PM. If you have any questions, ask your nurse or doctor.              citalopram  20 MG tablet Commonly known as: CeleXA  Take 1  tablet (20 mg total) by mouth daily.    diclofenac  50 MG EC tablet Commonly known as: VOLTAREN  Take 1 tablet (50 mg total) by mouth 2 (two) times daily.    lisinopril  20 MG tablet Commonly known as: ZESTRIL  Take 1 tablet (20 mg total) by mouth daily.    simvastatin  20 MG tablet Commonly known as: ZOCOR  Take 1 tablet (20 mg total) by mouth daily at 6 PM.    Vitamin D  (Ergocalciferol ) 1.25 MG (50000 UNIT) Caps capsule Commonly known as: DRISDOL  Take 1 capsule (50,000 Units total) by mouth every 7 (seven) days.             Allergies:  Allergies  No Known Allergies     Family History:      Family History  Problem Relation Age of  Onset   Hypertension Mother     Cancer Father     Hypertension Father     Colon cancer Neg Hx            Social History:  reports that she quit smoking about 15 years ago. Her smoking use included cigarettes. She started smoking about 45 years ago. She has a 7.5 pack-year smoking history. She has never used smokeless tobacco. She reports current alcohol use. She reports that she does not use drugs.   ROS: All other review of systems were reviewed and are negative except what is noted above in HPI   Physical Exam: BP 134/80   Pulse 84   LMP 10/17/2019 (Exact Date)   Constitutional:  Alert and oriented, No acute distress. HEENT:  AT, moist mucus membranes.  Trachea midline, no masses. Cardiovascular: No clubbing, cyanosis, or edema. Respiratory: Normal respiratory effort, no increased work of breathing. GI: Abdomen is soft, nontender, nondistended, no abdominal masses GU: No CVA tenderness.  Lymph: No cervical or inguinal lymphadenopathy. Skin: No rashes, bruises or suspicious lesions. Neurologic: Grossly intact, no focal deficits, moving all 4 extremities. Psychiatric: Normal mood and affect.   Laboratory Data: Recent Labs       Lab Results  Component Value Date    WBC 6.2 07/27/2023    HGB 13.3 07/27/2023    HCT 39.5 07/27/2023    MCV 93 07/27/2023    PLT 269 07/27/2023        Recent Labs       Lab Results  Component Value Date    CREATININE 0.97 07/27/2023        Recent Labs  No results found for: PSA     Recent Labs  No results found for: TESTOSTERONE     Recent Labs  No results found for: HGBA1C     Urinalysis Labs (Brief)          Component Value Date/Time    COLORURINE YELLOW 05/07/2020 1233    APPEARANCEUR Clear 10/06/2021 1455    LABSPEC 1.013 05/07/2020 1233    PHURINE 6.0 05/07/2020 1233    GLUCOSEU Negative 10/06/2021 1455    HGBUR SMALL (A) 05/07/2020 1233    BILIRUBINUR Negative 10/06/2021 1455    KETONESUR NEGATIVE  05/07/2020 1233    PROTEINUR Trace (A) 10/06/2021 1455    PROTEINUR 100 (A) 05/07/2020 1233    UROBILINOGEN 0.2 07/25/2014 0215    NITRITE Negative 10/06/2021 1455    NITRITE NEGATIVE 05/07/2020 1233    LEUKOCYTESUR Negative 10/06/2021 1455    LEUKOCYTESUR LARGE (A) 05/07/2020 1233        Recent Labs       Lab Results  Component Value Date    LABMICR See below: 10/06/2021    WBCUA 0-5 10/06/2021    LABEPIT 0-10 10/06/2021    MUCUS Present 10/06/2021    BACTERIA Few (A) 10/06/2021        Pertinent Imaging: KUb today: Images reviewed and discussed with the patient  Results for orders placed during the hospital encounter of 12/31/20   Abdomen 1 view (KUB)   Narrative CLINICAL DATA:  Nephrolithiasis.   EXAM: ABDOMEN - 1 VIEW   COMPARISON:  None.   FINDINGS: Multiple calcifications in the pelvis. Both kidneys are largely obscured by bowel contents. No definite renal stones are seen on this study. No ureteral stones identified. No other abnormalities.   IMPRESSION: The kidneys are largely obscured by bowel contents but no stones are seen.   Calcifications in the pelvis are nonspecific but may all be phleboliths. No other evidence of ureteral stone.     Electronically Signed By: Alm Pouch III M.D On: 01/03/2021 19:43   No results found for this or any previous visit.   No results found for this or any previous visit.   No results found for this or any previous visit.   Results for orders placed in visit on 08/15/23   Ultrasound renal complete   Narrative CLINICAL DATA:  Nephrolithiasis   EXAM: RENAL / URINARY TRACT ULTRASOUND COMPLETE   COMPARISON:  Renal ultrasound August 25, 2022   FINDINGS: Right Kidney:   Renal measurements: 11.6 x 6.1 x 5.9 cm = volume: 218 mL. Normal renal cortical thickness and echogenicity. No hydronephrosis. Multiple stones including a 0.7 cm stone lower pole. There is a 1.0 cm stone midpole.   Left Kidney:    Renal measurements: 11.1 x 5.2 x 4.9 cm = volume: 145 mL. Normal renal cortical thickness and echogenicity. No hydronephrosis. 9 mm stone lower pole. Additional probable stones are demonstrated.   Bladder:   Appears normal for degree of bladder distention.   Other:   None.   IMPRESSION: Bilateral nephrolithiasis. No hydronephrosis.     Electronically Signed By: Bard Moats M.D. On: 10/20/2023 13:32   No results found for this or any previous visit.   No results found for this or any previous visit.   Results for orders placed during the hospital encounter of 05/07/20   CT Renal Stone Study   Narrative CLINICAL DATA:  Right-sided lower back pain   EXAM: CT ABDOMEN AND PELVIS WITHOUT CONTRAST   TECHNIQUE: Multidetector CT imaging of the abdomen and pelvis was performed following the standard protocol without IV contrast.   COMPARISON:  None.   FINDINGS: Lower chest: The visualized heart size within normal limits. No pericardial fluid/thickening.   No hiatal hernia.   The visualized portions of the lungs are clear.   Hepatobiliary: Although limited due to the lack of intravenous contrast, normal in appearance without gross focal abnormality. The patient is status post cholecystectomy. No biliary ductal dilation.   Pancreas:  Unremarkable.  No surrounding inflammatory changes.   Spleen: Normal in size. Although limited due to the lack of intravenous contrast, normal in appearance.   Adrenals/Urinary Tract: Both adrenal glands appear normal. Moderate right pelvicaliectasis and ureterectasis is seen down the level of the distal ureter where there is several ureteral calculi 1 measuring 3 mm and 1 measuring 4 mm. There is right-sided perinephric and proximal periureteral stranding changes. There is a tiny punctate calcification in the posterior right bladder. There is also a a 3 mm calculus  seen in the mid pole the right kidney. No left-sided renal or  collecting system calculi are noted.   Stomach/Bowel: The stomach, small bowel, and colon are normal in appearance. No inflammatory changes or obstructive findings. Scattered colonic diverticula are noted.   Vascular/Lymphatic: There are no enlarged abdominal or pelvic lymph nodes. Scattered aortic atherosclerotic calcifications are seen without aneurysmal dilatation.   Reproductive: The uterus and adnexa are unremarkable.   Other: Bilateral fat containing inguinal hernias are noted.   Musculoskeletal: No acute or significant osseous findings.   IMPRESSION: Moderate right hydronephrosis to the distal ureter where there is several calcified ureteral calculi measuring 3 mm and 4 mm. There is also punctate calcification in the posterior right bladder.   Right-sided perinephric and periureteral stranding which could be from the recently passed stone versus pyelonephritis.   Nonobstructing right renal calculus.     Electronically Signed By: Merlyn Rajas M.D. On: 05/07/2020 16:09     Assessment & Plan:     1. Nephrolithiasis (Primary) -We discussed the management of kidney stones. These options include observation, ureteroscopy, shockwave lithotripsy (ESWL) and percutaneous nephrolithotomy (PCNL). We discussed which options are relevant to the patient's stone(s). We discussed the natural history of kidney stones as well as the complications of untreated stones and the impact on quality of life without treatment as well as with each of the above listed treatments. We also discussed the efficacy of each treatment in its ability to clear the stone burden. With any of these management options I discussed the signs and symptoms of infection and the need for emergent treatment should these be experienced. For each option we discussed the ability of each procedure to clear the patient of their stone burden.   For observation I described the risks which include but are not limited to silent  renal damage, life-threatening infection, need for emergent surgery, failure to pass stone and pain.   For ureteroscopy I described the risks which include bleeding, infection, damage to contiguous structures, positioning injury, ureteral stricture, ureteral avulsion, ureteral injury, need for prolonged ureteral stent, inability to perform ureteroscopy, need for an interval procedure, inability to clear stone burden, stent discomfort/pain, heart attack, stroke, pulmonary embolus and the inherent risks with general anesthesia.   For shockwave lithotripsy I described the risks which include arrhythmia, kidney contusion, kidney hemorrhage, need for transfusion, pain, inability to adequately break up stone, inability to pass stone fragments, Steinstrasse, infection associated with obstructing stones, need for alternate surgical procedure, need for repeat shockwave lithotripsy, MI, CVA, PE and the inherent risks with anesthesia/conscious sedation.   For PCNL I described the risks including positioning injury, pneumothorax, hydrothorax, need for chest tube, inability to clear stone burden, renal laceration, arterial venous fistula or malformation, need for embolization of kidney, loss of kidney or renal function, need for repeat procedure, need for prolonged nephrostomy tube, ureteral avulsion, MI, CVA, PE and the inherent risks of general anesthesia.   - The patient would like to proceed with right ureteroscopic stone extraction followup by left ureterowcopic stone extraction.

## 2024-02-28 ENCOUNTER — Encounter: Admitting: Urology

## 2024-02-28 ENCOUNTER — Telehealth: Payer: Self-pay

## 2024-02-28 ENCOUNTER — Ambulatory Visit: Payer: Self-pay | Admitting: Family Medicine

## 2024-02-28 DIAGNOSIS — N200 Calculus of kidney: Secondary | ICD-10-CM

## 2024-02-28 LAB — STONE ANALYSIS
Calcium Oxalate Dihydrate: 20 %
Calcium Oxalate Monohydrate: 80 %
Weight Calculi: 326 mg

## 2024-02-28 NOTE — Telephone Encounter (Signed)
 Patient missed her 8:20 appointment today.  Will need a soon appointment.  Call: [phone]

## 2024-03-01 NOTE — Telephone Encounter (Signed)
 Open in error

## 2024-03-26 ENCOUNTER — Encounter: Payer: 59 | Admitting: Family Medicine

## 2024-04-04 ENCOUNTER — Encounter: Payer: Self-pay | Admitting: Family Medicine

## 2024-04-04 ENCOUNTER — Ambulatory Visit: Admitting: Family Medicine

## 2024-04-04 VITALS — BP 135/89 | HR 101 | Temp 98.2°F | Ht 62.0 in | Wt 230.6 lb

## 2024-04-04 DIAGNOSIS — Z136 Encounter for screening for cardiovascular disorders: Secondary | ICD-10-CM

## 2024-04-04 DIAGNOSIS — E559 Vitamin D deficiency, unspecified: Secondary | ICD-10-CM

## 2024-04-04 DIAGNOSIS — F419 Anxiety disorder, unspecified: Secondary | ICD-10-CM

## 2024-04-04 DIAGNOSIS — Z1231 Encounter for screening mammogram for malignant neoplasm of breast: Secondary | ICD-10-CM

## 2024-04-04 DIAGNOSIS — F332 Major depressive disorder, recurrent severe without psychotic features: Secondary | ICD-10-CM | POA: Diagnosis not present

## 2024-04-04 DIAGNOSIS — E538 Deficiency of other specified B group vitamins: Secondary | ICD-10-CM

## 2024-04-04 DIAGNOSIS — Z0001 Encounter for general adult medical examination with abnormal findings: Secondary | ICD-10-CM

## 2024-04-04 DIAGNOSIS — Z1211 Encounter for screening for malignant neoplasm of colon: Secondary | ICD-10-CM

## 2024-04-04 DIAGNOSIS — Z130 Encounter for screening for diseases of the blood and blood-forming organs and certain disorders involving the immune mechanism: Secondary | ICD-10-CM

## 2024-04-04 DIAGNOSIS — Z0000 Encounter for general adult medical examination without abnormal findings: Secondary | ICD-10-CM

## 2024-04-04 LAB — LIPID PANEL

## 2024-04-04 LAB — BAYER DCA HB A1C WAIVED: HB A1C (BAYER DCA - WAIVED): 5.7 % — ABNORMAL HIGH (ref 4.8–5.6)

## 2024-04-04 MED ORDER — CITALOPRAM HYDROBROMIDE 20 MG PO TABS: 40.0000 mg | ORAL_TABLET | Freq: Every day | ORAL | 2 refills | Status: DC

## 2024-04-04 NOTE — Progress Notes (Signed)
 Complete physical exam  Patient: Sandra Carroll   DOB: 1965/11/17   58 y.o. Female  MRN: [id number]  Subjective:    Chief Complaint  Patient presents with   Annual Exam    Sandra Carroll is a 58 y.o. female who presents today for a complete physical exam. She reports consuming a general diet. The patient does not participate in regular exercise at present. She generally feels fairly well. She reports sleeping fairly well. She does not have additional problems to discuss today.   She has gained 9 pounds since February, increasing from 221 to 230 pounds. 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  She has noticed a decline in her physical conditioning and reports being more tired than usual. No leg swelling, headaches, visual changes, or changes in bowel habits.  She has a history of kidney stones and has undergone multiple surgeries for them. She recently had a stent removed and is scheduled for a follow-up in January for an ultrasound of her kidneys and bladder. She has reduced her salt intake and opts for low sodium products to manage her condition.  She is currently taking lisinopril  20 mg for blood pressure, with a recent reading of 135/89 mmHg. She also takes vitamin D  once a week. She has iron and B12 deficiencies.  She has been on Celexa  for depression and anxiety for about two years, currently at a dose of 20 mg. She notes that it helps but can tell when she is not on it, and she still experiences symptoms of fatigue and lack of motivation.      Most recent fall risk assessment:    04/04/2024    3:36 PM  Fall Risk   Falls in the past year? 0  Risk for fall due to : No Fall Risks  Follow up Falls evaluation completed     Most recent depression screenings:    04/04/2024    3:36 PM 02/03/2023    3:01 PM 07/14/2022    8:57  AM 06/24/2022    1:13 PM 01/13/2022    3:06 PM  Depression screen PHQ 2/9  Decreased Interest 0 1 0 0 1  Down, Depressed, Hopeless 0 0 0 1 0  PHQ - 2 Score 0 1 0 1 1  Altered sleeping 0 0 3 0 0  Tired, decreased energy 3 3 3  0 3  Change in appetite 0 0 0 0 0  Feeling bad or failure about yourself  0 0 0 0 0  Trouble concentrating 0 0 0 0 0  Moving slowly or fidgety/restless 0 0 0 0 0  Suicidal thoughts 0 0 0 0 0  PHQ-9 Score 3 4 6 1 4   Difficult doing work/chores Not difficult at all Not difficult at all Not difficult at all  Not difficult at all      04/04/2024    3:36 PM 02/03/2023    3:01 PM 07/14/2022    8:58 AM 06/24/2022    1:14 PM  GAD 7 : Generalized Anxiety Score  Nervous, Anxious, on Edge 0 0 0 1  Control/stop worrying 0 0 0 0  Worry too much - different things 0 0 0 0  Trouble relaxing 0 0 3 0  Restless 0 0 3 0  Easily annoyed or irritable 0 0 3 0  Afraid - awful might happen  0 0 0 0  Total GAD 7 Score 0 0 9 1  Anxiety Difficulty Not difficult at all Not difficult at all Not difficult at all       Vision:Within last year and Dental: No current dental problems and Receives regular dental care  Patient Active Problem List   Diagnosis Date Noted   Vitamin D  deficiency 02/03/2023   Major depressive disorder, recurrent severe without psychotic features (HCC) 12/09/2020   Hot flashes 12/09/2020   Arthritis 12/09/2020   Vitamin B12 deficiency 07/20/2020   Iron deficiency anemia 07/20/2020   Nephrolithiasis 05/07/2020   Anxiety 07/05/2019   History of kidney stones 08/28/2018   DDD (degenerative disc disease), lumbar 10/04/2016   Essential hypertension 10/04/2016   Pure hypercholesterolemia 10/04/2016   DUB (dysfunctional uterine bleeding) 10/04/2016   Internal hemorrhoids with other complication 06/25/2013   Constipation 02/16/2013   Past Medical History:  Diagnosis Date   Acute pyelonephritis 05/07/2020   Anxiety    Chronic back pain    Complication of  anesthesia 1991 and 1998   childbirth --reaction to anesthesia was itching.    DDD (degenerative disc disease)    Depression    Headache    migraines   Hemorrhoids    History of kidney stones    Hyperlipidemia    Hypertension    Iron deficiency anemia 07/20/2020   Kidney stone    PONV (postoperative nausea and vomiting)    Vitamin B12 deficiency 07/20/2020   Past Surgical History:  Procedure Laterality Date   ADENOIDECTOMY  1978   CESAREAN SECTION  1991 1998   pt had itching with 1991 c-section.    CHOLECYSTECTOMY     COLONOSCOPY N/A 03/01/2013   Dr. Shaaron- normal rectum except for anal canal/internal hemorrhoidal tags. colonic mucosa appeared normal.   CYSTOSCOPY W/ URETERAL STENT PLACEMENT Left 12/09/2019   Procedure: CYSTOSCOPY WITH RETROGRADE PYELOGRAM/URETERAL STENT PLACEMENT;  Surgeon: Watt Rush, MD;  Location: [facility];  Service: Urology;  Laterality: Left;   CYSTOSCOPY W/ URETERAL STENT PLACEMENT Right 05/07/2020   Procedure: CYSTOSCOPY WITH RETROGRADE PYELOGRAM/URETERAL STENT PLACEMENT;  Surgeon: Nieves Cough, MD;  Location: [facility];  Service: Urology;  Laterality: Right;   CYSTOSCOPY WITH RETROGRADE PYELOGRAM, URETEROSCOPY AND STENT PLACEMENT Right 06/02/2020   Procedure: CYSTOSCOPY WITH RETROGRADE PYELOGRAM, URETEROSCOPY, BASKET EXTRACTION OF STONES AND STENT EXCHANGE;  Surgeon: Sherrilee Belvie CROME, MD;  Location: [facility];  Service: Urology;  Laterality: Right;   CYSTOSCOPY WITH RETROGRADE PYELOGRAM, URETEROSCOPY AND STENT PLACEMENT  03/11/2021   Procedure: CYSTOSCOPY WITH BILATERAL RETROGRADE PYELOGRAM, BILATERAL URETEROSCOPY AND RIGHT URETERAL STENT PLACEMENT;  Surgeon: Sherrilee Belvie CROME, MD;  Location: [facility];  Service: Urology;;   CYSTOSCOPY/RETROGRADE/URETEROSCOPY/STONE EXTRACTION WITH BASKET Right 02/15/2024   Procedure: CYSTOSCOPY, WITH CALCULUS REMOVAL USING BASKET;  Surgeon: Sherrilee Belvie CROME, MD;  Location: [facility];  Service: Urology;  Laterality: Right;    CYSTOSCOPY/URETEROSCOPY/HOLMIUM LASER/STENT PLACEMENT Left 12/17/2019   Procedure: LEFT URETEROSCOPY/STENT EXCHANGE/ EXTRACTION OF LEFT URETERAL CALCULI;  Surgeon: Watt Rush, MD;  Location: [facility];  Service: Urology;  Laterality: Left;   CYSTOSCOPY/URETEROSCOPY/HOLMIUM LASER/STENT PLACEMENT Right 02/15/2024   Procedure: CYSTOSCOPY/URETEROSCOPY/HOLMIUM LASER/STENT PLACEMENT;  Surgeon: Sherrilee Belvie CROME, MD;  Location: [facility];  Service: Urology;  Laterality: Right;   HOLMIUM LASER APPLICATION Right 02/15/2024   Procedure: HOLMIUM LASER APPLICATION;  Surgeon: Sherrilee Belvie CROME, MD;  Location: [facility];  Service: Urology;  Laterality: Right;   KNEE ARTHROSCOPY Right 2003   LITHOTRIPSY     LUMBAR LAMINECTOMY/DECOMPRESSION MICRODISCECTOMY  03/02/2012  Procedure: LUMBAR LAMINECTOMY/DECOMPRESSION MICRODISCECTOMY 1 LEVEL;  Surgeon: Rockey LITTIE Peru, MD;  Location: [facility];  Service: Neurosurgery;  Laterality: Left;  LEFT Lumbar Five-Sacral One Laminectomy for Decompression    STONE EXTRACTION WITH BASKET  03/11/2021   Procedure: STONE EXTRACTION WITH BASKET BILATERAL;  Surgeon: Sherrilee Belvie LITTIE, MD;  Location: [facility];  Service: Urology;;   Social History   Tobacco Use   Smoking status: Former    Current packs/day: 0.00    Average packs/day: 0.3 packs/day for 30.0 years (7.5 ttl pk-yrs)    Types: Cigarettes    Start date: 38    Quit date: 2010    Years since quitting: 15.8   Smokeless tobacco: Never  Vaping Use   Vaping status: Never Used  Substance Use Topics   Alcohol use: Yes    Comment: rare   Drug use: Never   Social History   Socioeconomic History   Marital status: Single    Spouse name: Not on file   Number of children: Not on file   Years of education: Not on file   Highest education level: Not on file  Occupational History   Not on file  Tobacco Use   Smoking status: Former    Current packs/day: 0.00    Average packs/day: 0.3 packs/day for 30.0 years (7.5 ttl  pk-yrs)    Types: Cigarettes    Start date: 79    Quit date: 2010    Years since quitting: 15.8   Smokeless tobacco: Never  Vaping Use   Vaping status: Never Used  Substance and Sexual Activity   Alcohol use: Yes    Comment: rare   Drug use: Never   Sexual activity: Not Currently    Birth control/protection: Pill  Other Topics Concern   Not on file  Social History Narrative   ** Merged History Encounter **       Social Drivers of Health   Financial Resource Strain: Not on file  Food Insecurity: Not on file  Transportation Needs: Not on file  Physical Activity: Not on file  Stress: Not on file  Social Connections: Not on file  Intimate Partner Violence: Not on file   Family Status  Relation Name Status   Mother  Deceased   Father  Deceased   Sister  Alive   Brother  Alive   Sister  Alive   Neg Hx  (Not Specified)  No partnership data on file   Family History  Problem Relation Age of Onset   Hypertension Mother    Cancer Father    Hypertension Father    Colon cancer Neg Hx    No Known Allergies    Patient Care Team: Belford Pascucci, Rock HERO, FNP as PCP - General (Family Medicine) Shaaron, Lamar HERO, MD as Consulting Physician (Gastroenterology) Merlynn Niki FALCON, FNP (Family Medicine)   Outpatient Medications Prior to Visit  Medication Sig   diclofenac  (VOLTAREN ) 50 MG EC tablet Take 1 tablet (50 mg total) by mouth 2 (two) times daily.   lisinopril  (ZESTRIL ) 20 MG tablet TAKE ONE TABLET BY MOUTH DAILY   simvastatin  (ZOCOR ) 20 MG tablet TAKE ONE TABLET DAILY AT 6 PM   tamsulosin  (FLOMAX ) 0.4 MG CAPS capsule Take 1 capsule (0.4 mg total) by mouth daily after supper.   Vitamin D , Ergocalciferol , (DRISDOL ) 1.25 MG (50000 UNIT) CAPS capsule Take 1 capsule (50,000 Units total) by mouth every 7 (seven) days.   [DISCONTINUED] citalopram  (CELEXA ) 20 MG tablet TAKE ONE TABLET BY  MOUTH DAILY   [DISCONTINUED] ondansetron  (ZOFRAN ) 4 MG tablet Take 1 tablet (4 mg total) by mouth  daily as needed for nausea or vomiting.   [DISCONTINUED] oxyCODONE -acetaminophen  (PERCOCET) 5-325 MG tablet Take 1 tablet by mouth every 4 (four) hours as needed for severe pain (pain score 7-10).   No facility-administered medications prior to visit.    ROS per HPI      Objective:     BP 135/89   Pulse (!) 101   Temp 98.2 F (36.8 C)   Ht 5' 2 (1.575 m)   Wt 230 lb 9.6 oz (104.6 kg)   LMP 10/17/2019 (Exact Date)   SpO2 94%   BMI 42.18 kg/m  BP Readings from Last 3 Encounters:  04/04/24 135/89  02/15/24 121/79  02/12/24 105/75   Wt Readings from Last 3 Encounters:  04/04/24 230 lb 9.6 oz (104.6 kg)  02/15/24 212 lb (96.2 kg)  02/12/24 221 lb 9 oz (100.5 kg)   SpO2 Readings from Last 3 Encounters:  04/04/24 94%  02/15/24 100%  02/12/24 96%      Physical Exam Vitals and nursing note reviewed.  Constitutional:      General: She is not in acute distress.    Appearance: Normal appearance. She is well-developed and well-groomed. She is morbidly obese. She is not ill-appearing, toxic-appearing or diaphoretic.  HENT:     Head: Normocephalic and atraumatic.     Right Ear: Tympanic membrane, ear canal and external ear normal.     Left Ear: Tympanic membrane, ear canal and external ear normal.     Nose: Nose normal.     Mouth/Throat:     Mouth: Mucous membranes are moist.  Eyes:     Extraocular Movements: Extraocular movements intact.     Conjunctiva/sclera: Conjunctivae normal.     Pupils: Pupils are equal, round, and reactive to light.  Cardiovascular:     Rate and Rhythm: Normal rate and regular rhythm.     Heart sounds: Normal heart sounds. No murmur heard.    No friction rub. No gallop.  Pulmonary:     Effort: Pulmonary effort is normal.     Breath sounds: Normal breath sounds.  Abdominal:     General: Bowel sounds are normal.     Palpations: Abdomen is soft.  Musculoskeletal:     Cervical back: Normal range of motion and neck supple.     Right lower  leg: No edema.     Left lower leg: No edema.  Lymphadenopathy:     Cervical: No cervical adenopathy.  Skin:    General: Skin is warm and dry.     Capillary Refill: Capillary refill takes less than 2 seconds.  Neurological:     General: No focal deficit present.     Mental Status: She is alert and oriented to person, place, and time.  Psychiatric:        Mood and Affect: Mood normal.        Behavior: Behavior normal. Behavior is cooperative.        Thought Content: Thought content normal.        Judgment: Judgment normal.      Last CBC Lab Results  Component Value Date   WBC 8.6 02/12/2024   HGB 12.6 02/12/2024   HCT 38.0 02/12/2024   MCV 96.0 02/12/2024   MCH 31.8 02/12/2024   RDW 13.6 02/12/2024   PLT 276 02/12/2024   Last metabolic panel Lab Results  Component Value Date   GLUCOSE  84 07/27/2023   NA 139 07/27/2023   K 4.3 07/27/2023   CL 103 07/27/2023   CO2 24 07/27/2023   BUN 18 07/27/2023   CREATININE 0.97 07/27/2023   EGFR 69 07/27/2023   CALCIUM 10.4 (H) 07/27/2023   PROT 7.4 07/27/2023   ALBUMIN 4.4 07/27/2023   LABGLOB 3.0 07/27/2023   AGRATIO 1.5 07/14/2022   BILITOT 0.3 07/27/2023   ALKPHOS 121 07/27/2023   AST 22 07/27/2023   ALT 16 07/27/2023   ANIONGAP 6 05/09/2020   Last lipids Lab Results  Component Value Date   CHOL 215 (H) 07/27/2023   HDL 55 07/27/2023   LDLCALC 137 (H) 07/27/2023   TRIG 127 07/27/2023   CHOLHDL 3.9 07/27/2023   Last thyroid  functions Lab Results  Component Value Date   TSH 0.985 07/27/2023   T4TOTAL 6.9 07/27/2023   Last vitamin D  Lab Results  Component Value Date   VD25OH 28.1 (L) 07/27/2023   Last vitamin B12 and Folate Lab Results  Component Value Date   VITAMINB12 317 07/27/2023   FOLATE 7.5 07/27/2023        Assessment & Plan:    Routine Health Maintenance and Physical Exam  Immunization History  Administered Date(s) Administered   Moderna Sars-Covid-2 Vaccination 11/25/2019, 12/30/2019    Tdap 03/24/2023    Health Maintenance  Topic Date Due   Fecal DNA (Cologuard)  Never done   COVID-19 Vaccine (3 - 2025-26 season) 04/20/2024 (Originally 02/19/2024)   Zoster Vaccines- Shingrix (1 of 2) 07/05/2024 (Originally 02/19/2018)   Influenza Vaccine  09/17/2024 (Originally 01/19/2024)   Pneumococcal Vaccine: 50+ Years (1 of 1 - PCV) 04/04/2025 (Originally 02/19/2018)   Hepatitis B Vaccines 19-59 Average Risk (1 of 3 - 19+ 3-dose series) 04/04/2025 (Originally 02/20/1987)   Mammogram  04/04/2025 (Originally 07/20/2023)   Cervical Cancer Screening (HPV/Pap Cotest)  06/24/2026   DTaP/Tdap/Td (2 - Td or Tdap) 03/23/2033   Hepatitis C Screening  Completed   HIV Screening  Completed   HPV VACCINES  Aged Out   Meningococcal B Vaccine  Aged Out   Colonoscopy  Discontinued    Discussed health benefits of physical activity, and encouraged her to engage in regular exercise appropriate for her age and condition.  Problem List Items Addressed This Visit       Other   Anxiety   Relevant Medications   citalopram  (CELEXA ) 20 MG tablet   Other Relevant Orders   CMP14+EGFR   TSH   T4, Free   Vitamin D , 25-hydroxy   Anemia Profile B   Vitamin B12 deficiency   Relevant Orders   Anemia Profile B   Major depressive disorder, recurrent severe without psychotic features (HCC)   Relevant Medications   citalopram  (CELEXA ) 20 MG tablet   Other Relevant Orders   CMP14+EGFR   Lipid panel   TSH   T4, Free   Vitamin D , 25-hydroxy   Anemia Profile B   Vitamin D  deficiency   Relevant Orders   Vitamin D , 25-hydroxy   Other Visit Diagnoses       Annual physical exam    -  Primary   Relevant Orders   CMP14+EGFR   Lipid panel   MM DIGITAL SCREENING BILATERAL   Anemia Profile B     Encounter for screening for cardiovascular disorders       Relevant Orders   CMP14+EGFR   Lipid panel   TSH   T4, Free   Bayer DCA Hb A1c Waived   Anemia  Profile B     Screening for endocrine, nutritional,  metabolic and immunity disorder       Relevant Orders   CMP14+EGFR   Lipid panel   TSH   T4, Free   Anemia Profile B     Screening for colorectal cancer       Relevant Orders   Cologuard     Encounter for screening mammogram for malignant neoplasm of breast       Relevant Orders   MM DIGITAL SCREENING BILATERAL     Assessment and Plan    Adult Wellness Visit Routine adult wellness visit with discussion on weight gain, dietary habits, and exercise. She is considering increasing physical activity by walking. - Encouraged walking for at least 30 minutes, 5 days a week, at a fast pace to elevate heart rate. - Discussed dietary modifications to reduce fried foods and increase vegetable intake.  Obesity Weight gain of 9 pounds since February, currently weighing 230 pounds. Reports dietary habits include consumption of fried foods and lack of regular exercise. - Encouraged dietary modifications to reduce fried foods and increase vegetable intake. - Encouraged regular physical activity, such as walking, to aid in weight management.  Major depressive disorder Currently on Celexa  20 mg for approximately two years. Reports symptoms of fatigue and lack of motivation, indicating possible suboptimal control of depression. - Increased Celexa  to 40 mg daily to improve symptom control. - Instructed to monitor for side effects and efficacy of increased dosage.  Iron deficiency anemia Reports fatigue and low energy, which may be related to iron deficiency anemia. - Ordered recheck of iron levels to assess current status.  Vitamin B12 deficiency Reports fatigue and low energy, which may be related to vitamin B12 deficiency. - Ordered recheck of vitamin B12 levels to assess current status.  Vitamin D  deficiency Currently taking vitamin D  once a week. - Continue vitamin D  supplementation once a week.  Essential hypertension Blood pressure recorded at 135/89 mmHg. Currently on lisinopril  20  mg. - Continue lisinopril  20 mg daily.  History of nephrolithiasis Multiple kidney stone surgeries. Concerns about potential kidney disease due to history of nephrolithiasis. - Ordered ultrasound of kidneys and bladder for follow-up in January.   Return in 3 months (on 07/05/2024), or if symptoms worsen or fail to improve, for Depression, Anxiety.     Rosaline Bruns, FNP

## 2024-04-05 ENCOUNTER — Ambulatory Visit: Payer: Self-pay | Admitting: Family Medicine

## 2024-04-05 LAB — CMP14+EGFR
ALT: 19 IU/L (ref 0–32)
AST: 22 IU/L (ref 0–40)
Albumin: 4.3 g/dL (ref 3.8–4.9)
Alkaline Phosphatase: 129 IU/L (ref 49–135)
BUN/Creatinine Ratio: 18 (ref 9–23)
BUN: 17 mg/dL (ref 6–24)
Bilirubin Total: 0.3 mg/dL (ref 0.0–1.2)
CO2: 23 mmol/L (ref 20–29)
Calcium: 10.4 mg/dL — ABNORMAL HIGH (ref 8.7–10.2)
Chloride: 106 mmol/L (ref 96–106)
Creatinine, Ser: 0.93 mg/dL (ref 0.57–1.00)
Globulin, Total: 2.9 g/dL (ref 1.5–4.5)
Glucose: 94 mg/dL (ref 70–99)
Potassium: 4.4 mmol/L (ref 3.5–5.2)
Sodium: 143 mmol/L (ref 134–144)
Total Protein: 7.2 g/dL (ref 6.0–8.5)
eGFR: 72 mL/min/1.73 (ref >59)

## 2024-04-05 LAB — LIPID PANEL
Chol/HDL Ratio: 4.3 ratio (ref 0.0–4.4)
Cholesterol, Total: 210 mg/dL — ABNORMAL HIGH (ref 100–199)
HDL: 49 mg/dL (ref >39)
LDL Chol Calc (NIH): 141 mg/dL — ABNORMAL HIGH (ref 0–99)
Triglycerides: 113 mg/dL (ref 0–149)
VLDL Cholesterol Cal: 20 mg/dL (ref 5–40)

## 2024-04-05 LAB — TSH: TSH: 1.03 u[IU]/mL (ref 0.450–4.500)

## 2024-04-05 LAB — ANEMIA PROFILE B
Basophils Absolute: 0.1 x10E3/uL (ref 0.0–0.2)
Basos: 1 %
EOS (ABSOLUTE): 0.2 x10E3/uL (ref 0.0–0.4)
Eos: 3 %
Ferritin: 115 ng/mL (ref 15–150)
Folate: 10.8 ng/mL (ref >3.0)
Hematocrit: 37.5 % (ref 34.0–46.6)
Hemoglobin: 12.5 g/dL (ref 11.1–15.9)
Immature Grans (Abs): 0 x10E3/uL (ref 0.0–0.1)
Immature Granulocytes: 0 %
Iron Saturation: 17 % (ref 15–55)
Iron: 57 ug/dL (ref 27–159)
Lymphocytes Absolute: 1.9 x10E3/uL (ref 0.7–3.1)
Lymphs: 31 %
MCH: 31.3 pg (ref 26.6–33.0)
MCHC: 33.3 g/dL (ref 31.5–35.7)
MCV: 94 fL (ref 79–97)
Monocytes Absolute: 0.5 x10E3/uL (ref 0.1–0.9)
Monocytes: 8 %
Neutrophils Absolute: 3.7 x10E3/uL (ref 1.4–7.0)
Neutrophils: 57 %
Platelets: 299 x10E3/uL (ref 150–450)
RBC: 3.99 x10E6/uL (ref 3.77–5.28)
RDW: 13 % (ref 11.7–15.4)
Retic Ct Pct: 1 % (ref 0.6–2.6)
Total Iron Binding Capacity: 330 ug/dL (ref 250–450)
UIBC: 273 ug/dL (ref 131–425)
Vitamin B-12: 258 pg/mL (ref 232–1245)
WBC: 6.3 x10E3/uL (ref 3.4–10.8)

## 2024-04-05 LAB — T4, FREE: Free T4: 0.99 ng/dL (ref 0.82–1.77)

## 2024-04-05 LAB — VITAMIN D 25 HYDROXY (VIT D DEFICIENCY, FRACTURES): Vit D, 25-Hydroxy: 18.7 ng/mL — ABNORMAL LOW (ref 30.0–100.0)

## 2024-04-08 ENCOUNTER — Telehealth: Payer: Self-pay

## 2024-04-08 ENCOUNTER — Other Ambulatory Visit (HOSPITAL_COMMUNITY): Payer: Self-pay

## 2024-04-08 DIAGNOSIS — F332 Major depressive disorder, recurrent severe without psychotic features: Secondary | ICD-10-CM

## 2024-04-08 DIAGNOSIS — F419 Anxiety disorder, unspecified: Secondary | ICD-10-CM

## 2024-04-08 NOTE — Telephone Encounter (Signed)
 Pharmacy Patient Advocate Encounter   Received notification from Fax that prior authorization for Citalopram  Hydrobromide 20MG  tablets  is required/requested.   Insurance verification completed.   The patient is insured through [facility].   Per test claim:  Citalopram  40mg  tablets is preferred by the insurance.  If suggested medication is appropriate, Please send in a new RX and discontinue this one. If not, please advise as to why it's not appropriate so that we may request a Prior Authorization. Please note, some preferred medications may still require a PA.  If the suggested medications have not been trialed and there are no contraindications to their use, the PA will not be submitted, as it will not be approved.   CMM Key: [id number]

## 2024-04-09 MED ORDER — CITALOPRAM HYDROBROMIDE 40 MG PO TABS: 40.0000 mg | ORAL_TABLET | Freq: Every day | ORAL | 3 refills | Status: DC

## 2024-04-10 ENCOUNTER — Other Ambulatory Visit (HOSPITAL_COMMUNITY): Payer: Self-pay

## 2024-04-17 ENCOUNTER — Telehealth: Payer: Self-pay

## 2024-04-17 NOTE — Telephone Encounter (Signed)
 Per CRM, patient called office stating that she was returning a call from someone here. I tried calling patient back to let her know that I do not see any notes of anyone calling her, but no answer and voicemail full.

## 2024-05-21 ENCOUNTER — Encounter: Payer: Self-pay | Admitting: Family Medicine

## 2024-07-05 ENCOUNTER — Ambulatory Visit (INDEPENDENT_AMBULATORY_CARE_PROVIDER_SITE_OTHER): Admitting: Family Medicine

## 2024-07-05 ENCOUNTER — Encounter: Payer: Self-pay | Admitting: Family Medicine

## 2024-07-05 VITALS — BP 144/85 | HR 86 | Temp 97.9°F | Ht 60.0 in | Wt 236.4 lb

## 2024-07-05 DIAGNOSIS — Z1211 Encounter for screening for malignant neoplasm of colon: Secondary | ICD-10-CM | POA: Diagnosis not present

## 2024-07-05 DIAGNOSIS — F419 Anxiety disorder, unspecified: Secondary | ICD-10-CM

## 2024-07-05 DIAGNOSIS — F332 Major depressive disorder, recurrent severe without psychotic features: Secondary | ICD-10-CM

## 2024-07-05 DIAGNOSIS — M1990 Unspecified osteoarthritis, unspecified site: Secondary | ICD-10-CM | POA: Diagnosis not present

## 2024-07-05 DIAGNOSIS — E559 Vitamin D deficiency, unspecified: Secondary | ICD-10-CM | POA: Diagnosis not present

## 2024-07-05 DIAGNOSIS — Z1212 Encounter for screening for malignant neoplasm of rectum: Secondary | ICD-10-CM

## 2024-07-05 DIAGNOSIS — I10 Essential (primary) hypertension: Secondary | ICD-10-CM

## 2024-07-05 DIAGNOSIS — E7800 Pure hypercholesterolemia, unspecified: Secondary | ICD-10-CM | POA: Diagnosis not present

## 2024-07-05 MED ORDER — SIMVASTATIN 20 MG PO TABS: 20.0000 mg | ORAL_TABLET | Freq: Every day | ORAL | 2 refills | Status: AC

## 2024-07-05 MED ORDER — LISINOPRIL 20 MG PO TABS: 20.0000 mg | ORAL_TABLET | Freq: Every day | ORAL | 2 refills | Status: AC

## 2024-07-05 MED ORDER — VITAMIN D (ERGOCALCIFEROL) 1.25 MG (50000 UNIT) PO CAPS: 50000.0000 [IU] | ORAL_CAPSULE | ORAL | 3 refills | Status: AC

## 2024-07-05 MED ORDER — DICLOFENAC SODIUM 50 MG PO TBEC: 50.0000 mg | DELAYED_RELEASE_TABLET | Freq: Two times a day (BID) | ORAL | 3 refills | Status: AC

## 2024-07-05 NOTE — Patient Instructions (Signed)
 Goal BP:  Less than 130/80  Take your medications faithfully as prescribed. Maintain a healthy weight. Get at least 150 minutes of aerobic exercise per week. Minimize salt intake, less than 2000 mg per day. Minimize alcohol intake.  DASH Eating Plan DASH stands for Dietary Approaches to Stop Hypertension. The DASH eating plan is a healthy eating plan that has been shown to reduce high blood pressure (hypertension). Additional health benefits may include reducing the risk of type 2 diabetes mellitus, heart disease, and stroke. The DASH eating plan may also help with weight loss.  WHAT DO I NEED TO KNOW ABOUT THE DASH EATING PLAN? For the DASH eating plan, you will follow these general guidelines: Choose foods with a percent daily value for sodium of less than 5% (as listed on the food label). Use salt-free seasonings or herbs instead of table salt or sea salt. Check with your health care provider or pharmacist before using salt substitutes. Eat lower-sodium products, often labeled as lower sodium or no salt added. Eat fresh foods. Eat more vegetables, fruits, and low-fat dairy products. Choose whole grains. Look for the word whole as the first word in the ingredient list. Choose fish and skinless chicken or [place] more often than red meat. Limit fish, poultry, and meat to 6 oz (170 g) each day. Limit sweets, desserts, sugars, and sugary drinks. Choose heart-healthy fats. Limit cheese to 1 oz (28 g) per day. Eat more home-cooked food and less restaurant, buffet, and fast food. Limit fried foods. Cook foods using methods other than frying. Limit canned vegetables. If you do use them, rinse them well to decrease the sodium. When eating at a restaurant, ask that your food be prepared with less salt, or no salt if possible.  WHAT FOODS CAN I EAT? Seek help from a dietitian for individual calorie needs.  Grains Whole grain or whole wheat bread. Hoshino rice. Whole grain or whole  wheat pasta. Quinoa, bulgur, and whole grain cereals. Low-sodium cereals. Corn or whole wheat flour tortillas. Whole grain cornbread. Whole grain crackers. Low-sodium crackers.  Vegetables Fresh or frozen vegetables (raw, steamed, roasted, or grilled). Low-sodium or reduced-sodium tomato and vegetable juices. Low-sodium or reduced-sodium tomato sauce and paste. Low-sodium or reduced-sodium canned vegetables.   Fruits All fresh, canned (in natural juice), or frozen fruits.  Meat and Other Protein Products Ground beef (85% or leaner), grass-fed beef, or beef trimmed of fat. Skinless chicken or [place]. Ground chicken or [place]. Pork trimmed of fat. All fish and seafood. Eggs. Dried beans, peas, or lentils. Unsalted nuts and seeds. Unsalted canned beans.  Dairy Low-fat dairy products, such as skim or 1% milk, 2% or reduced-fat cheeses, low-fat ricotta or cottage cheese, or plain low-fat yogurt. Low-sodium or reduced-sodium cheeses.  Fats and Oils Tub margarines without trans fats. Light or reduced-fat mayonnaise and salad dressings (reduced sodium). Avocado. Safflower, olive, or canola oils. Natural peanut or almond butter.  Other Unsalted popcorn and pretzels. The items listed above may not be a complete list of recommended foods or beverages. Contact your dietitian for more options.  WHAT FOODS ARE NOT RECOMMENDED?  Grains White bread. White pasta. White rice. Refined cornbread. Bagels and croissants. Crackers that contain trans fat.  Vegetables Creamed or fried vegetables. Vegetables in a cheese sauce. Regular canned vegetables. Regular canned tomato sauce and paste. Regular tomato and vegetable juices.  Fruits Dried fruits. Canned fruit in light or heavy syrup. Fruit juice.  Meat and Other Protein Products Fatty cuts of meat. Ribs,  chicken wings, bacon, sausage, bologna, salami, chitterlings, fatback, hot dogs, bratwurst, and packaged luncheon meats. Salted nuts and seeds. Canned  beans with salt.  Dairy Whole or 2% milk, cream, half-and-half, and cream cheese. Whole-fat or sweetened yogurt. Full-fat cheeses or blue cheese. Nondairy creamers and whipped toppings. Processed cheese, cheese spreads, or cheese curds.  Condiments Onion and garlic salt, seasoned salt, table salt, and sea salt. Canned and packaged gravies. Worcestershire sauce. Tartar sauce. Barbecue sauce. Teriyaki sauce. Soy sauce, including reduced sodium. Steak sauce. Fish sauce. Oyster sauce. Cocktail sauce. Horseradish. Ketchup and mustard. Meat flavorings and tenderizers. Bouillon cubes. Hot sauce. Tabasco sauce. Marinades. Taco seasonings. Relishes.  Fats and Oils Butter, stick margarine, lard, shortening, ghee, and bacon fat. Coconut, palm kernel, or palm oils. Regular salad dressings.  Other Pickles and olives. Salted popcorn and pretzels.  The items listed above may not be a complete list of foods and beverages to avoid. Contact your dietitian for more information.  WHERE CAN I FIND MORE INFORMATION? National [employer], [employer], [employer]: [email or web address] Document Released: 05/26/2011 Document Revised: 10/21/2013 Document Reviewed: 04/10/2013 [facility] Patient Information 2015 [place], [place]. This information is not intended to replace advice given to you by your health care provider. Make sure you discuss any questions you have with your health care provider.   I think that you would greatly benefit from seeing a nutritionist.  If you are interested, please call Dr. Wonda at [phone] to schedule an appointment.

## 2024-07-05 NOTE — Progress Notes (Signed)
 "    Subjective:  Patient ID: Sandra Carroll, female    DOB: 1965-10-11, 59 y.o.   MRN: [id number]  Patient Care Team: Severa Rock HERO, FNP as PCP - General (Family Medicine) Shaaron Lamar HERO, MD as Consulting Physician (Gastroenterology) Merlynn Niki FALCON, FNP (Family Medicine)   Chief Complaint:  Anxiety and Depression (3 month follow up )   HPI: Sandra Carroll is a 59 y.o. female presenting on 07/05/2024 for Anxiety and Depression (3 month follow up )   Sandra Carroll is a 59 year old female with hypertension and hyperlipidemia who presents for medication management and follow-up.  She has discontinued citalopram , attributing her improved mood to lifestyle changes, including leaving her boyfriend. Her depression and associated thoughts have improved.  She has not been able to pick up her blood pressure medication, lisinopril , due to losing Medicaid coverage and the pharmacy not accepting her GoodRx card. Her blood pressure was high initially but has improved slightly.  She is concerned about her cholesterol medication, simvastatin , and needs to have it filled. She is considering switching pharmacies to manage costs.  She continues to take diclofenac  for arthritis pain, noting it is effective, especially in the mornings. She sometimes takes two pills depending on the weather, which helps manage her arthritis and foot spur pain.  She has not been taking her vitamin D  supplement, which was previously prescribed once a week.          Relevant past medical, surgical, family, and social history reviewed and updated as indicated.  Allergies and medications reviewed and updated. Data reviewed: Chart in Epic.   Past Medical History:  Diagnosis Date   Acute pyelonephritis 05/07/2020   Anxiety    Chronic back pain    Complication of anesthesia 1991 and 1998   childbirth --reaction to anesthesia was itching.    DDD (degenerative disc disease)    Depression    Headache     migraines   Hemorrhoids    History of kidney stones    Hyperlipidemia    Hypertension    Iron deficiency anemia 07/20/2020   Kidney stone    PONV (postoperative nausea and vomiting)    Vitamin B12 deficiency 07/20/2020    Past Surgical History:  Procedure Laterality Date   ADENOIDECTOMY  1978   CESAREAN SECTION  1991 1998   pt had itching with 1991 c-section.    CHOLECYSTECTOMY     COLONOSCOPY N/A 03/01/2013   Dr. Shaaron- normal rectum except for anal canal/internal hemorrhoidal tags. colonic mucosa appeared normal.   CYSTOSCOPY W/ URETERAL STENT PLACEMENT Left 12/09/2019   Procedure: CYSTOSCOPY WITH RETROGRADE PYELOGRAM/URETERAL STENT PLACEMENT;  Surgeon: Watt Rush, MD;  Location: [facility];  Service: Urology;  Laterality: Left;   CYSTOSCOPY W/ URETERAL STENT PLACEMENT Right 05/07/2020   Procedure: CYSTOSCOPY WITH RETROGRADE PYELOGRAM/URETERAL STENT PLACEMENT;  Surgeon: Nieves Cough, MD;  Location: [facility];  Service: Urology;  Laterality: Right;   CYSTOSCOPY WITH RETROGRADE PYELOGRAM, URETEROSCOPY AND STENT PLACEMENT Right 06/02/2020   Procedure: CYSTOSCOPY WITH RETROGRADE PYELOGRAM, URETEROSCOPY, BASKET EXTRACTION OF STONES AND STENT EXCHANGE;  Surgeon: Sherrilee Belvie CROME, MD;  Location: [facility];  Service: Urology;  Laterality: Right;   CYSTOSCOPY WITH RETROGRADE PYELOGRAM, URETEROSCOPY AND STENT PLACEMENT  03/11/2021   Procedure: CYSTOSCOPY WITH BILATERAL RETROGRADE PYELOGRAM, BILATERAL URETEROSCOPY AND RIGHT URETERAL STENT PLACEMENT;  Surgeon: Sherrilee Belvie CROME, MD;  Location: [facility];  Service: Urology;;   CYSTOSCOPY/RETROGRADE/URETEROSCOPY/STONE EXTRACTION WITH BASKET Right 02/15/2024   Procedure: CYSTOSCOPY, WITH  CALCULUS REMOVAL USING BASKET;  Surgeon: Sherrilee Belvie CROME, MD;  Location: [facility];  Service: Urology;  Laterality: Right;   CYSTOSCOPY/URETEROSCOPY/HOLMIUM LASER/STENT PLACEMENT Left 12/17/2019   Procedure: LEFT URETEROSCOPY/STENT EXCHANGE/ EXTRACTION OF LEFT URETERAL  CALCULI;  Surgeon: Watt Rush, MD;  Location: [facility];  Service: Urology;  Laterality: Left;   CYSTOSCOPY/URETEROSCOPY/HOLMIUM LASER/STENT PLACEMENT Right 02/15/2024   Procedure: CYSTOSCOPY/URETEROSCOPY/HOLMIUM LASER/STENT PLACEMENT;  Surgeon: Sherrilee Belvie CROME, MD;  Location: [facility];  Service: Urology;  Laterality: Right;   HOLMIUM LASER APPLICATION Right 02/15/2024   Procedure: HOLMIUM LASER APPLICATION;  Surgeon: Sherrilee Belvie CROME, MD;  Location: [facility];  Service: Urology;  Laterality: Right;   KNEE ARTHROSCOPY Right 2003   LITHOTRIPSY     LUMBAR LAMINECTOMY/DECOMPRESSION MICRODISCECTOMY  03/02/2012   Procedure: LUMBAR LAMINECTOMY/DECOMPRESSION MICRODISCECTOMY 1 LEVEL;  Surgeon: Rockey CROME Peru, MD;  Location: [facility];  Service: Neurosurgery;  Laterality: Left;  LEFT Lumbar Five-Sacral One Laminectomy for Decompression    STONE EXTRACTION WITH BASKET  03/11/2021   Procedure: STONE EXTRACTION WITH BASKET BILATERAL;  Surgeon: Sherrilee Belvie CROME, MD;  Location: [facility];  Service: Urology;;    Social History   Socioeconomic History   Marital status: Single    Spouse name: Not on file   Number of children: Not on file   Years of education: Not on file   Highest education level: Not on file  Occupational History   Not on file  Tobacco Use   Smoking status: Former    Current packs/day: 0.00    Average packs/day: 0.3 packs/day for 30.0 years (7.5 ttl pk-yrs)    Types: Cigarettes    Start date: 59    Quit date: 2010    Years since quitting: 16.0   Smokeless tobacco: Never  Vaping Use   Vaping status: Never Used  Substance and Sexual Activity   Alcohol use: Yes    Comment: rare   Drug use: Never   Sexual activity: Not Currently    Birth control/protection: Pill  Other Topics Concern   Not on file  Social History Narrative   ** Merged History Encounter **       Social Drivers of Health   Tobacco Use: Medium Risk (07/05/2024)   Patient History    Smoking Tobacco Use:  Former    Smokeless Tobacco Use: Never    Passive Exposure: Not on Actuary Strain: Not on file  Food Insecurity: Not on file  Transportation Needs: Not on file  Physical Activity: Not on file  Stress: Not on file  Social Connections: Not on file  Intimate Partner Violence: Not on file  Depression (PHQ2-9): Low Risk (07/05/2024)   Depression (PHQ2-9)    PHQ-2 Score: 1  Alcohol Screen: Not on file  Housing: Not on file  Utilities: Not on file  Health Literacy: Not on file    Outpatient Encounter Medications as of 07/05/2024  Medication Sig   tamsulosin  (FLOMAX ) 0.4 MG CAPS capsule Take 1 capsule (0.4 mg total) by mouth daily after supper.   [DISCONTINUED] citalopram  (CELEXA ) 40 MG tablet Take 1 tablet (40 mg total) by mouth daily.   [DISCONTINUED] diclofenac  (VOLTAREN ) 50 MG EC tablet Take 1 tablet (50 mg total) by mouth 2 (two) times daily.   [DISCONTINUED] lisinopril  (ZESTRIL ) 20 MG tablet TAKE ONE TABLET BY MOUTH DAILY   [DISCONTINUED] simvastatin  (ZOCOR ) 20 MG tablet TAKE ONE TABLET DAILY AT 6 PM   [DISCONTINUED] Vitamin D , Ergocalciferol , (DRISDOL ) 1.25 MG (50000 UNIT) CAPS capsule  Take 1 capsule (50,000 Units total) by mouth every 7 (seven) days.   diclofenac  (VOLTAREN ) 50 MG EC tablet Take 1 tablet (50 mg total) by mouth 2 (two) times daily.   lisinopril  (ZESTRIL ) 20 MG tablet Take 1 tablet (20 mg total) by mouth daily.   simvastatin  (ZOCOR ) 20 MG tablet Take 1 tablet (20 mg total) by mouth daily at 6 PM.   Vitamin D , Ergocalciferol , (DRISDOL ) 1.25 MG (50000 UNIT) CAPS capsule Take 1 capsule (50,000 Units total) by mouth every 7 (seven) days.   No facility-administered encounter medications on file as of 07/05/2024.    Allergies[1]  Pertinent ROS per HPI, otherwise unremarkable      Objective:  BP (!) 144/85   Pulse 86   Temp 97.9 F (36.6 C)   Ht 5' (1.524 m)   Wt 236 lb 6.4 oz (107.2 kg)   LMP 10/17/2019   SpO2 96%   BMI 46.17 kg/m    Wt  Readings from Last 3 Encounters:  07/05/24 236 lb 6.4 oz (107.2 kg)  04/04/24 230 lb 9.6 oz (104.6 kg)  02/15/24 212 lb (96.2 kg)    Physical Exam Vitals and nursing note reviewed.  Constitutional:      General: She is not in acute distress.    Appearance: Normal appearance. She is obese. She is not ill-appearing, toxic-appearing or diaphoretic.  HENT:     Head: Normocephalic and atraumatic.     Nose: Nose normal.     Mouth/Throat:     Mouth: Mucous membranes are moist.  Eyes:     Pupils: Pupils are equal, round, and reactive to light.  Cardiovascular:     Rate and Rhythm: Normal rate and regular rhythm.     Heart sounds: Normal heart sounds.  Pulmonary:     Effort: Pulmonary effort is normal.     Breath sounds: Normal breath sounds.  Musculoskeletal:     Cervical back: Neck supple.     Right lower leg: No edema.     Left lower leg: No edema.  Skin:    General: Skin is warm and dry.     Capillary Refill: Capillary refill takes less than 2 seconds.  Neurological:     General: No focal deficit present.     Mental Status: She is alert and oriented to person, place, and time.  Psychiatric:        Mood and Affect: Mood normal.        Behavior: Behavior normal.        Thought Content: Thought content normal.        Judgment: Judgment normal.       Results for orders placed or performed in visit on 04/04/24  Bayer DCA Hb A1c Waived   Collection Time: 04/04/24  3:49 PM  Result Value Ref Range   HB A1C (BAYER DCA - WAIVED) 5.7 (H) 4.8 - 5.6 %  CMP14+EGFR   Collection Time: 04/04/24  3:51 PM  Result Value Ref Range   Glucose 94 70 - 99 mg/dL   BUN 17 6 - 24 mg/dL   Creatinine, Ser 9.06 0.57 - 1.00 mg/dL   eGFR 72 >40 fO/fpw/8.26   BUN/Creatinine Ratio 18 9 - 23   Sodium 143 134 - 144 mmol/L   Potassium 4.4 3.5 - 5.2 mmol/L   Chloride 106 96 - 106 mmol/L   CO2 23 20 - 29 mmol/L   Calcium 10.4 (H) 8.7 - 10.2 mg/dL   Total Protein 7.2 6.0 - 8.5  g/dL   Albumin 4.3 3.8  - 4.9 g/dL   Globulin, Total 2.9 1.5 - 4.5 g/dL   Bilirubin Total 0.3 0.0 - 1.2 mg/dL   Alkaline Phosphatase 129 49 - 135 IU/L   AST 22 0 - 40 IU/L   ALT 19 0 - 32 IU/L  Lipid panel   Collection Time: 04/04/24  3:51 PM  Result Value Ref Range   Cholesterol, Total 210 (H) 100 - 199 mg/dL   Triglycerides 886 0 - 149 mg/dL   HDL 49 >60 mg/dL   VLDL Cholesterol Cal 20 5 - 40 mg/dL   LDL Chol Calc (NIH) 858 (H) 0 - 99 mg/dL   Chol/HDL Ratio 4.3 0.0 - 4.4 ratio  TSH   Collection Time: 04/04/24  3:51 PM  Result Value Ref Range   TSH 1.030 0.450 - 4.500 uIU/mL  T4, Free   Collection Time: 04/04/24  3:51 PM  Result Value Ref Range   Free T4 0.99 0.82 - 1.77 ng/dL  Vitamin D , 25-hydroxy   Collection Time: 04/04/24  3:51 PM  Result Value Ref Range   Vit D, 25-Hydroxy 18.7 (L) 30.0 - 100.0 ng/mL  Anemia Profile B   Collection Time: 04/04/24  3:51 PM  Result Value Ref Range   Total Iron Binding Capacity 330 250 - 450 ug/dL   UIBC 726 868 - 574 ug/dL   Iron 57 27 - 840 ug/dL   Iron Saturation 17 15 - 55 %   Ferritin 115 15 - 150 ng/mL   Vitamin B-12 258 232 - 1,245 pg/mL   Folate 10.8 >3.0 ng/mL   WBC 6.3 3.4 - 10.8 x10E3/uL   RBC 3.99 3.77 - 5.28 x10E6/uL   Hemoglobin 12.5 11.1 - 15.9 g/dL   Hematocrit 62.4 65.9 - 46.6 %   MCV 94 79 - 97 fL   MCH 31.3 26.6 - 33.0 pg   MCHC 33.3 31.5 - 35.7 g/dL   RDW 86.9 88.2 - 84.5 %   Platelets 299 150 - 450 x10E3/uL   Neutrophils 57 Not Estab. %   Lymphs 31 Not Estab. %   Monocytes 8 Not Estab. %   Eos 3 Not Estab. %   Basos 1 Not Estab. %   Neutrophils Absolute 3.7 1.4 - 7.0 x10E3/uL   Lymphocytes Absolute 1.9 0.7 - 3.1 x10E3/uL   Monocytes Absolute 0.5 0.1 - 0.9 x10E3/uL   EOS (ABSOLUTE) 0.2 0.0 - 0.4 x10E3/uL   Basophils Absolute 0.1 0.0 - 0.2 x10E3/uL   Immature Granulocytes 0 Not Estab. %   Immature Grans (Abs) 0.0 0.0 - 0.1 x10E3/uL   Retic Ct Pct 1.0 0.6 - 2.6 %       Pertinent labs & imaging results that were available  during my care of the patient were reviewed by me and considered in my medical decision making.  Assessment & Plan:  Sandra Carroll was seen today for anxiety and depression.  Diagnoses and all orders for this visit:  Major depressive disorder, recurrent severe without psychotic features (HCC)  Anxiety  Essential hypertension -     lisinopril  (ZESTRIL ) 20 MG tablet; Take 1 tablet (20 mg total) by mouth daily.  Screening for colorectal cancer -     Cologuard  Arthritis -     diclofenac  (VOLTAREN ) 50 MG EC tablet; Take 1 tablet (50 mg total) by mouth 2 (two) times daily.  Pure hypercholesterolemia -     simvastatin  (ZOCOR ) 20 MG tablet; Take 1 tablet (20 mg total) by mouth  daily at 6 PM.  Vitamin D  deficiency -     Vitamin D , Ergocalciferol , (DRISDOL ) 1.25 MG (50000 UNIT) CAPS capsule; Take 1 capsule (50,000 Units total) by mouth every 7 (seven) days.      Medication management and refills She has discontinued citalopram  due to improvement in depression and anxiety symptoms after leaving her boyfriend. She is currently taking diclofenac  for pain management and requires refills for lisinopril  and simvastatin . She is unable to use GoodRx at her current pharmacy but will switch to [facility], which accepts GoodRx. - Sent lisinopril  and simvastatin  prescriptions to [facility] with GoodRx discount. - Continue diclofenac  for pain management, with a maximum of 150 mg per day. - Monitor kidney function due to diclofenac  use.  Essential hypertension Blood pressure was initially high but has improved. - Continue lisinopril  for blood pressure management.  Pure hypercholesterolemia She requires simvastatin  for cholesterol management. - Continue simvastatin  for cholesterol management.  Osteoarthritis She experiences significant relief from diclofenac , especially in the mornings, and uses it as needed based on weather conditions. - Continue diclofenac  for osteoarthritis pain management, with a maximum  of 150 mg per day.  Vitamin D  deficiency She is not currently taking vitamin D  supplements. Low vitamin D  levels can exacerbate arthritic pain. - Prescribed vitamin D  supplementation once a week.  Screening for colorectal cancer She requires a new Cologuard kit for colorectal cancer screening. - Sent order for Cologuard kit.          Continue all other maintenance medications.  Follow up plan: Return in 3 months (on 10/03/2024), or if symptoms worsen or fail to improve, for chronic follow up.   Continue healthy lifestyle choices, including diet (rich in fruits, vegetables, and lean proteins, and low in salt and simple carbohydrates) and exercise (at least 30 minutes of moderate physical activity daily).  Educational handout given for DASH diet, HTN, arthritis   The above assessment and management plan was discussed with the patient. The patient verbalized understanding of and has agreed to the management plan. Patient is aware to call the clinic if they develop any new symptoms or if symptoms persist or worsen. Patient is aware when to return to the clinic for a follow-up visit. Patient educated on when it is appropriate to go to the emergency department.   Sandra Bruns, FNP-C [facility] [phone]     [1] No Known Allergies  "

## 2024-07-08 ENCOUNTER — Ambulatory Visit (HOSPITAL_COMMUNITY)
Admission: RE | Admit: 2024-07-08 | Discharge: 2024-07-08 | Disposition: A | Discharge status: Home / Self Care | Source: Ambulatory Visit | Attending: Urology | Admitting: Urology

## 2024-07-08 DIAGNOSIS — N200 Calculus of kidney: Secondary | ICD-10-CM | POA: Insufficient documentation

## 2024-07-14 NOTE — Progress Notes (Unsigned)
 "  H&P  Chief Complaint: ***  History of Present Illness: Bryannah Boston Plair is a 59 y.o. year old female ***  Past Medical History:  Diagnosis Date   Acute pyelonephritis 05/07/2020   Anxiety    Chronic back pain    Complication of anesthesia 1991 and 1998   childbirth --reaction to anesthesia was itching.    DDD (degenerative disc disease)    Depression    Headache    migraines   Hemorrhoids    History of kidney stones    Hyperlipidemia    Hypertension    Iron deficiency anemia 07/20/2020   Kidney stone    PONV (postoperative nausea and vomiting)    Vitamin B12 deficiency 07/20/2020    Past Surgical History:  Procedure Laterality Date   ADENOIDECTOMY  1978   CESAREAN SECTION  1991 1998   pt had itching with 1991 c-section.    CHOLECYSTECTOMY     COLONOSCOPY N/A 03/01/2013   Dr. Shaaron- normal rectum except for anal canal/internal hemorrhoidal tags. colonic mucosa appeared normal.   CYSTOSCOPY W/ URETERAL STENT PLACEMENT Left 12/09/2019   Procedure: CYSTOSCOPY WITH RETROGRADE PYELOGRAM/URETERAL STENT PLACEMENT;  Surgeon: Watt Rush, MD;  Location: [facility];  Service: Urology;  Laterality: Left;   CYSTOSCOPY W/ URETERAL STENT PLACEMENT Right 05/07/2020   Procedure: CYSTOSCOPY WITH RETROGRADE PYELOGRAM/URETERAL STENT PLACEMENT;  Surgeon: Nieves Cough, MD;  Location: [facility];  Service: Urology;  Laterality: Right;   CYSTOSCOPY WITH RETROGRADE PYELOGRAM, URETEROSCOPY AND STENT PLACEMENT Right 06/02/2020   Procedure: CYSTOSCOPY WITH RETROGRADE PYELOGRAM, URETEROSCOPY, BASKET EXTRACTION OF STONES AND STENT EXCHANGE;  Surgeon: Sherrilee Belvie CROME, MD;  Location: [facility];  Service: Urology;  Laterality: Right;   CYSTOSCOPY WITH RETROGRADE PYELOGRAM, URETEROSCOPY AND STENT PLACEMENT  03/11/2021   Procedure: CYSTOSCOPY WITH BILATERAL RETROGRADE PYELOGRAM, BILATERAL URETEROSCOPY AND RIGHT URETERAL STENT PLACEMENT;  Surgeon: Sherrilee Belvie CROME, MD;  Location: [facility];  Service: Urology;;    CYSTOSCOPY/RETROGRADE/URETEROSCOPY/STONE EXTRACTION WITH BASKET Right 02/15/2024   Procedure: CYSTOSCOPY, WITH CALCULUS REMOVAL USING BASKET;  Surgeon: Sherrilee Belvie CROME, MD;  Location: [facility];  Service: Urology;  Laterality: Right;   CYSTOSCOPY/URETEROSCOPY/HOLMIUM LASER/STENT PLACEMENT Left 12/17/2019   Procedure: LEFT URETEROSCOPY/STENT EXCHANGE/ EXTRACTION OF LEFT URETERAL CALCULI;  Surgeon: Watt Rush, MD;  Location: [facility];  Service: Urology;  Laterality: Left;   CYSTOSCOPY/URETEROSCOPY/HOLMIUM LASER/STENT PLACEMENT Right 02/15/2024   Procedure: CYSTOSCOPY/URETEROSCOPY/HOLMIUM LASER/STENT PLACEMENT;  Surgeon: Sherrilee Belvie CROME, MD;  Location: [facility];  Service: Urology;  Laterality: Right;   HOLMIUM LASER APPLICATION Right 02/15/2024   Procedure: HOLMIUM LASER APPLICATION;  Surgeon: Sherrilee Belvie CROME, MD;  Location: [facility];  Service: Urology;  Laterality: Right;   KNEE ARTHROSCOPY Right 2003   LITHOTRIPSY     LUMBAR LAMINECTOMY/DECOMPRESSION MICRODISCECTOMY  03/02/2012   Procedure: LUMBAR LAMINECTOMY/DECOMPRESSION MICRODISCECTOMY 1 LEVEL;  Surgeon: Rockey CROME Peru, MD;  Location: [facility];  Service: Neurosurgery;  Laterality: Left;  LEFT Lumbar Five-Sacral One Laminectomy for Decompression    STONE EXTRACTION WITH BASKET  03/11/2021   Procedure: STONE EXTRACTION WITH BASKET BILATERAL;  Surgeon: Sherrilee Belvie CROME, MD;  Location: [facility];  Service: Urology;;    Home Medications:  (Not in a hospital admission)   Allergies: Allergies[1]  Family History  Problem Relation Age of Onset   Hypertension Mother    Cancer Father    Hypertension Father    Colon cancer Neg Hx     Social History:  reports that she quit smoking about 16 years ago. Her smoking use  included cigarettes. She started smoking about 46 years ago. She has a 7.5 pack-year smoking history. She has never used smokeless tobacco. She reports current alcohol use. She reports that she does not use drugs.  ROS: A  complete review of systems was performed.  All systems are negative except for pertinent findings as noted.  Physical Exam:  Vital signs in last 24 hours: @VSRANGES @ General:  Alert and oriented, No acute distress HEENT: Normocephalic, atraumatic Neck: No JVD or lymphadenopathy Cardiovascular: Regular rate  Lungs: Normal inspiratory/expiratory excursion Abdomen: Soft, nontender, nondistended, no abdominal masses Back: No CVA tenderness Extremities: No edema Neurologic: Grossly intact  I have reviewed prior pt notes  I have reviewed notes from referring/previous physicians  I have reviewed urinalysis results  I have independently reviewed prior imaging  I have reviewed prior urine culture   Impression/Assessment:  ***  Plan:  ***  Garnette HERO Kalie Cabral 07/14/2024, 10:30 AM  Garnette HERO. Braxdon Gappa MD      [1] No Known Allergies  "

## 2024-07-16 ENCOUNTER — Ambulatory Visit: Admitting: Urology

## 2024-07-16 DIAGNOSIS — N200 Calculus of kidney: Secondary | ICD-10-CM

## 2024-08-07 ENCOUNTER — Ambulatory Visit: Admitting: Urology

## 2024-10-09 ENCOUNTER — Ambulatory Visit: Admitting: Family Medicine
